# Patient Record
Sex: Male | Born: 1950 | Hispanic: Yes | Marital: Married | Smoking: Never smoker
Health system: Southern US, Community
[De-identification: ages and names within clinical notes are randomized; demographics above are authoritative.]

## PROBLEM LIST (undated history)

## (undated) DIAGNOSIS — E785 Hyperlipidemia, unspecified: Secondary | ICD-10-CM

## (undated) DIAGNOSIS — I82409 Acute embolism and thrombosis of unspecified deep veins of unspecified lower extremity: Secondary | ICD-10-CM

## (undated) DIAGNOSIS — I10 Essential (primary) hypertension: Secondary | ICD-10-CM

## (undated) DIAGNOSIS — M549 Dorsalgia, unspecified: Secondary | ICD-10-CM

## (undated) DIAGNOSIS — C4440 Unspecified malignant neoplasm of skin of scalp and neck: Secondary | ICD-10-CM

## (undated) DIAGNOSIS — M1990 Unspecified osteoarthritis, unspecified site: Secondary | ICD-10-CM

## (undated) HISTORY — DX: Unspecified osteoarthritis, unspecified site: M19.90

## (undated) HISTORY — DX: Acute embolism and thrombosis of unspecified deep veins of unspecified lower extremity: I82.409

## (undated) HISTORY — PX: SPINE SURGERY: SHX786

## (undated) HISTORY — DX: Hyperlipidemia, unspecified: E78.5

## (undated) HISTORY — DX: Unspecified malignant neoplasm of skin of scalp and neck: C44.40

## (undated) HISTORY — DX: Essential (primary) hypertension: I10

## (undated) HISTORY — PX: TRACHEAL SURGERY: SHX1096

## (undated) HISTORY — DX: Dorsalgia, unspecified: M54.9

## (undated) HISTORY — PX: BACK SURGERY: SHX140

---

## 2013-10-11 DIAGNOSIS — M542 Cervicalgia: Secondary | ICD-10-CM | POA: Insufficient documentation

## 2013-10-11 DIAGNOSIS — M545 Low back pain, unspecified: Secondary | ICD-10-CM | POA: Insufficient documentation

## 2013-10-11 DIAGNOSIS — M25559 Pain in unspecified hip: Secondary | ICD-10-CM | POA: Insufficient documentation

## 2014-02-05 DIAGNOSIS — R1013 Epigastric pain: Secondary | ICD-10-CM | POA: Insufficient documentation

## 2014-02-11 DIAGNOSIS — A048 Other specified bacterial intestinal infections: Secondary | ICD-10-CM | POA: Insufficient documentation

## 2015-04-23 DIAGNOSIS — H6123 Impacted cerumen, bilateral: Secondary | ICD-10-CM | POA: Insufficient documentation

## 2015-04-23 DIAGNOSIS — Z23 Encounter for immunization: Secondary | ICD-10-CM | POA: Insufficient documentation

## 2015-04-23 DIAGNOSIS — R350 Frequency of micturition: Secondary | ICD-10-CM | POA: Insufficient documentation

## 2015-04-23 DIAGNOSIS — L989 Disorder of the skin and subcutaneous tissue, unspecified: Secondary | ICD-10-CM | POA: Insufficient documentation

## 2015-05-05 DIAGNOSIS — E782 Mixed hyperlipidemia: Secondary | ICD-10-CM | POA: Insufficient documentation

## 2015-07-17 DIAGNOSIS — Z85828 Personal history of other malignant neoplasm of skin: Secondary | ICD-10-CM | POA: Insufficient documentation

## 2015-11-27 DIAGNOSIS — R1312 Dysphagia, oropharyngeal phase: Secondary | ICD-10-CM | POA: Insufficient documentation

## 2015-11-27 DIAGNOSIS — D7212 Drug rash with eosinophilia and systemic symptoms syndrome: Secondary | ICD-10-CM | POA: Insufficient documentation

## 2015-11-27 DIAGNOSIS — R8281 Pyuria: Secondary | ICD-10-CM | POA: Insufficient documentation

## 2015-11-27 DIAGNOSIS — T50905A Adverse effect of unspecified drugs, medicaments and biological substances, initial encounter: Secondary | ICD-10-CM | POA: Insufficient documentation

## 2015-11-27 DIAGNOSIS — R339 Retention of urine, unspecified: Secondary | ICD-10-CM | POA: Insufficient documentation

## 2015-11-27 DIAGNOSIS — G7281 Critical illness myopathy: Secondary | ICD-10-CM | POA: Insufficient documentation

## 2015-11-27 DIAGNOSIS — D539 Nutritional anemia, unspecified: Secondary | ICD-10-CM | POA: Insufficient documentation

## 2015-11-27 DIAGNOSIS — Z8639 Personal history of other endocrine, nutritional and metabolic disease: Secondary | ICD-10-CM | POA: Insufficient documentation

## 2015-11-27 DIAGNOSIS — G9340 Encephalopathy, unspecified: Secondary | ICD-10-CM | POA: Insufficient documentation

## 2015-11-27 DIAGNOSIS — Z86711 Personal history of pulmonary embolism: Secondary | ICD-10-CM | POA: Insufficient documentation

## 2016-03-01 DIAGNOSIS — M898X9 Other specified disorders of bone, unspecified site: Secondary | ICD-10-CM | POA: Insufficient documentation

## 2016-11-21 DIAGNOSIS — R05 Cough: Secondary | ICD-10-CM | POA: Insufficient documentation

## 2016-11-21 DIAGNOSIS — R053 Chronic cough: Secondary | ICD-10-CM | POA: Insufficient documentation

## 2017-05-09 DIAGNOSIS — K219 Gastro-esophageal reflux disease without esophagitis: Secondary | ICD-10-CM | POA: Insufficient documentation

## 2017-06-26 DIAGNOSIS — Z6826 Body mass index (BMI) 26.0-26.9, adult: Secondary | ICD-10-CM | POA: Insufficient documentation

## 2017-10-05 DIAGNOSIS — F05 Delirium due to known physiological condition: Secondary | ICD-10-CM | POA: Insufficient documentation

## 2017-11-25 DIAGNOSIS — T819XXA Unspecified complication of procedure, initial encounter: Secondary | ICD-10-CM | POA: Insufficient documentation

## 2017-11-25 DIAGNOSIS — M4620 Osteomyelitis of vertebra, site unspecified: Secondary | ICD-10-CM | POA: Insufficient documentation

## 2017-11-25 DIAGNOSIS — T888XXA Other specified complications of surgical and medical care, not elsewhere classified, initial encounter: Secondary | ICD-10-CM | POA: Insufficient documentation

## 2017-11-27 DIAGNOSIS — R768 Other specified abnormal immunological findings in serum: Secondary | ICD-10-CM | POA: Insufficient documentation

## 2018-01-17 DIAGNOSIS — Z981 Arthrodesis status: Secondary | ICD-10-CM | POA: Insufficient documentation

## 2018-02-07 ENCOUNTER — Encounter: Payer: Self-pay | Admitting: Emergency Medicine

## 2018-02-07 ENCOUNTER — Emergency Department
Admission: EM | Admit: 2018-02-07 | Discharge: 2018-02-07 | Disposition: A | Discharge status: Home / Self Care | Payer: Medicare Other | Attending: Emergency Medicine | Admitting: Emergency Medicine

## 2018-02-07 ENCOUNTER — Other Ambulatory Visit: Payer: Self-pay

## 2018-02-07 ENCOUNTER — Emergency Department: Payer: Medicare Other

## 2018-02-07 DIAGNOSIS — R51 Headache: Secondary | ICD-10-CM | POA: Diagnosis not present

## 2018-02-07 DIAGNOSIS — Y999 Unspecified external cause status: Secondary | ICD-10-CM | POA: Insufficient documentation

## 2018-02-07 DIAGNOSIS — Y9289 Other specified places as the place of occurrence of the external cause: Secondary | ICD-10-CM | POA: Insufficient documentation

## 2018-02-07 DIAGNOSIS — M25521 Pain in right elbow: Secondary | ICD-10-CM | POA: Insufficient documentation

## 2018-02-07 DIAGNOSIS — M542 Cervicalgia: Secondary | ICD-10-CM | POA: Insufficient documentation

## 2018-02-07 DIAGNOSIS — M545 Low back pain: Secondary | ICD-10-CM | POA: Diagnosis not present

## 2018-02-07 DIAGNOSIS — M79642 Pain in left hand: Secondary | ICD-10-CM | POA: Diagnosis present

## 2018-02-07 DIAGNOSIS — W1781XA Fall down embankment (hill), initial encounter: Secondary | ICD-10-CM | POA: Insufficient documentation

## 2018-02-07 DIAGNOSIS — W19XXXA Unspecified fall, initial encounter: Secondary | ICD-10-CM

## 2018-02-07 DIAGNOSIS — Y9389 Activity, other specified: Secondary | ICD-10-CM | POA: Insufficient documentation

## 2018-02-07 MED ORDER — TETANUS-DIPHTH-ACELL PERTUSSIS 5-2.5-18.5 LF-MCG/0.5 IM SUSP
0.5000 mL | Freq: Once | INTRAMUSCULAR | Status: DC
  Filled 2018-02-07: qty 0.5

## 2018-02-07 NOTE — ED Triage Notes (Signed)
Pt presents to ED c/o fall. EMS report pt was picked up from metal recycling facility. Pt had been walking around outside and was startled by stray dogs and fell down an 8-58ft embankment and got tangled in a chain link fence with barbed wire. C/o generalized aching. States hx back surgery, is supposed to have another surgery this Friday.

## 2018-02-07 NOTE — ED Notes (Signed)
Abrasions noted to scalp, L hand, and R elbow.

## 2018-02-07 NOTE — ED Provider Notes (Signed)
[facility] Emergency Department Provider Note  ____________________________________________  Time seen: Approximately 4:15 PM  I have reviewed the triage vital signs and the nursing notes.   HISTORY  Chief Complaint Fall    HPI Russell [name] is a 67 y.o. male presents to the emergency department after a fall that occurred today.  Patient reports that he was going to a [place] when he was startled by 3 dogs.  Patient reports that he rolled down an 8 foot embankment and collided with a metal fence that he believes had barbed wire.  Patient reports no new blurry vision, nausea, vomiting and abdominal pain.  He reports most of his pain localized to the head, left hand, right elbow and neck.  He denies weakness, radiculopathy or changes in sensation of the upper extremities.  Patient reports that he is anticipating having a back surgery within the next 2 days.  He reports that his tetanus status is out of date.  He is not currently taking any blood thinners.  He denies being bitten by the dog's.  No alleviating medications were attempted prior to presenting to the emergency department.   History reviewed. No pertinent past medical history.  There are no active problems to display for this patient.   Past Surgical History:  Procedure Laterality Date  . BACK SURGERY      Prior to Admission medications   Not on File    Allergies Patient has no known allergies.  History reviewed. No pertinent family history.  Social History Social History   Tobacco Use  . Smoking status: Never Smoker  . Smokeless tobacco: Never Used  Substance Use Topics  . Alcohol use: No    Frequency: Never  . Drug use: No     Review of Systems  Constitutional: No fever/chills Eyes: No visual changes. No discharge ENT: No upper respiratory complaints. Cardiovascular: no chest pain. Respiratory: no cough. No SOB. Gastrointestinal: No abdominal pain.  No nausea, no vomiting.   No diarrhea.  No constipation. Musculoskeletal: Patient has left hand, right elbow and neck pain. Skin:  patient has abrasions. Neurological: Patient has headache.   ____________________________________________   PHYSICAL EXAM:  VITAL SIGNS: ED Triage Vitals [02/07/18 1451]  Enc Vitals Group     BP 118/81     Pulse Rate 90     Resp 18     Temp 97.7 F (36.5 C)     Temp Source Oral     SpO2 96 %     Weight 180 lb (81.6 kg)     Height 5\' 9"  (1.753 m)     Head Circumference      Peak Flow      Pain Score 5     Pain Loc      Pain Edu?      Excl. in [place]?      Constitutional: Alert and oriented. Well appearing and in no acute distress. Eyes: Conjunctivae are normal. PERRL. EOMI. Head: Atraumatic. ENT:      Ears: TMs are pearly bilaterally.      Nose: No congestion/rhinnorhea.      Mouth/Throat: Mucous membranes are moist.  Neck: No stridor.  No cervical spine tenderness to palpation. Cardiovascular: Normal rate, regular rhythm. Normal S1 and S2.  Good peripheral circulation. Respiratory: Normal respiratory effort without tachypnea or retractions. Lungs CTAB. Good air entry to the bases with no decreased or absent breath sounds. Gastrointestinal: Bowel sounds 4 quadrants. Soft and nontender to palpation. No guarding or rigidity.  No palpable masses. No distention. No CVA tenderness. Musculoskeletal: Full range of motion to all extremities. No gross deformities appreciated. Neurologic:  Normal speech and language. No gross focal neurologic deficits are appreciated.  Skin:  Skin is warm, dry and intact. No rash noted. Psychiatric: Mood and affect are normal. Speech and behavior are normal. Patient exhibits appropriate insight and judgement.   ____________________________________________   LABS (all labs ordered are listed, but only abnormal results are displayed)  Labs Reviewed - No data to  display ____________________________________________  EKG   ____________________________________________  RADIOLOGY Unk Pinto, personally viewed and evaluated these images (plain radiographs) as part of my medical decision making, as well as reviewing the written report by the radiologist.    Dg Lumbar Spine 2-3 Views  Result Date: 02/07/2018 CLINICAL DATA:  Pain post fall EXAM: LUMBAR SPINE - 2-3 VIEW COMPARISON:  None FINDINGS: Five non-rib-bearing lumbar vertebra. Hypoplastic last rib pair. Scattered endplate spur formation lumbar spine. Vertebral body and disc space heights maintained. No acute fracture, subluxation or bone destruction. RIGHT SI joint preserved, LEFT SI joint less well defined question due to slight rotation. Scattered pelvic phleboliths. IVC filter noted. 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 No definite acute bony abnormalities. Electronically Signed   By: Lavonia Dana M.D.   On: 02/07/2018 16:53   Dg Elbow Complete Right  Result Date: 02/07/2018 CLINICAL DATA:  Pain post fall EXAM: RIGHT ELBOW - COMPLETE 3+ VIEW COMPARISON:  None FINDINGS: Osseous demineralization. Joint spaces preserved. No acute fracture, dislocation or bone destruction. No joint effusion. IMPRESSION: No acute osseous abnormalities. Electronically Signed   By: Lavonia Dana M.D.   On: 02/07/2018 16:54   Ct Head Wo Contrast  Result Date: 02/07/2018 CLINICAL DATA:  Status post fall. EMS report pt was picked up from metal recycling facility. Pt had been walking around outside and was startled by stray dogs and fell down an 8-21ft embankment and got tangled in a chain link fence. EXAM: CT HEAD WITHOUT CONTRAST CT CERVICAL SPINE WITHOUT CONTRAST TECHNIQUE: Multidetector CT imaging of the head and cervical spine was performed following the standard protocol without intravenous contrast. Multiplanar CT image reconstructions of the cervical spine  were also generated. COMPARISON:  None. FINDINGS: CT HEAD FINDINGS Brain: No evidence of acute infarction, hemorrhage, hydrocephalus, extra-axial collection or mass lesion/mass effect. Vascular: No hyperdense vessel or unexpected calcification. Skull: No osseous abnormality. Sinuses/Orbits: Visualized paranasal sinuses are clear. Visualized mastoid sinuses are clear. Visualized orbits demonstrate no focal abnormality. Other: None CT CERVICAL SPINE FINDINGS Alignment: Normal. Skull base and vertebrae: No acute fracture. No primary bone lesion or focal pathologic process. Soft tissues and spinal canal: No prevertebral fluid or swelling. No visible canal hematoma. Disc levels: Moderate right facet arthropathy at C3-4. Moderate left facet arthropathy at C4-5. Degenerative disc disease with disc height loss at C5-6 with a broad-based disc osteophyte complex and bilateral uncovertebral degenerative changes. Bilateral foraminal narrowing at C5-6. Partially visualize is cervicothoracic posterior spinal fusion hardware from C6 extending into the thoracic spine. Upper chest: Lung apices are clear. Other: No fluid collection or hematoma. IMPRESSION: 1. No acute intracranial pathology. 2.  No acute osseous injury of the cervical spine. Electronically Signed   By: Kathreen Devoid   On: 02/07/2018 16:37   Ct Cervical Spine Wo Contrast  Result Date: 02/07/2018 CLINICAL DATA:  Status post fall. EMS report pt was picked up from metal recycling facility. Pt had been walking around  outside and was startled by stray dogs and fell down an 8-54ft embankment and got tangled in a chain link fence. EXAM: CT HEAD WITHOUT CONTRAST CT CERVICAL SPINE WITHOUT CONTRAST TECHNIQUE: Multidetector CT imaging of the head and cervical spine was performed following the standard protocol without intravenous contrast. Multiplanar CT image reconstructions of the cervical spine were also generated. COMPARISON:  None. FINDINGS: CT HEAD FINDINGS Brain: No  evidence of acute infarction, hemorrhage, hydrocephalus, extra-axial collection or mass lesion/mass effect. Vascular: No hyperdense vessel or unexpected calcification. Skull: No osseous abnormality. Sinuses/Orbits: Visualized paranasal sinuses are clear. Visualized mastoid sinuses are clear. Visualized orbits demonstrate no focal abnormality. Other: None CT CERVICAL SPINE FINDINGS Alignment: Normal. Skull base and vertebrae: No acute fracture. No primary bone lesion or focal pathologic process. Soft tissues and spinal canal: No prevertebral fluid or swelling. No visible canal hematoma. Disc levels: Moderate right facet arthropathy at C3-4. Moderate left facet arthropathy at C4-5. Degenerative disc disease with disc height loss at C5-6 with a broad-based disc osteophyte complex and bilateral uncovertebral degenerative changes. Bilateral foraminal narrowing at C5-6. Partially visualize is cervicothoracic posterior spinal fusion hardware from C6 extending into the thoracic spine. Upper chest: Lung apices are clear. Other: No fluid collection or hematoma. IMPRESSION: 1. No acute intracranial pathology. 2.  No acute osseous injury of the cervical spine. Electronically Signed   By: Kathreen Devoid   On: 02/07/2018 16:37   Dg Hand Complete Left  Result Date: 02/07/2018 CLINICAL DATA:  Left hand pain after falling. EXAM: LEFT HAND - COMPLETE 3+ VIEW COMPARISON:  None. FINDINGS: No fracture or dislocation. There is moderate osteoarthrosis of the fifth distal and proximal interphalangeal joints. Mild diffuse osteopenia. IMPRESSION: No acute fracture or dislocation of the left hand. Interphalangeal osteoarthrosis of the fifth digit. Electronically Signed   By: Ulyses Jarred M.D.   On: 02/07/2018 16:52    ____________________________________________    PROCEDURES  Procedure(s) performed:    Procedures    Medications  Tdap (BOOSTRIX) injection 0.5 mL (0.5 mLs Intramuscular Refused 02/07/18 1708)      ____________________________________________   INITIAL IMPRESSION / ASSESSMENT AND PLAN / ED COURSE  Pertinent labs & imaging results that were available during my care of the patient were reviewed by me and considered in my medical decision making (see chart for details).  Review of the [place] CSRS was performed in accordance of the [place] prior to dispensing any controlled drugs.     Assessment and plan Fall Patient presents to the emergency department after a fall that occurred earlier today.  Differential diagnosis included subdural hematoma, C-spine fracture, abrasion and contusion.  X-ray examination revealed no acute fractures or bony abnormalities.  Basic wound care was provided in the emergency department.  Patient reports that he has pain medicine at home and declined additional prescriptions for pain medicine.  Patient was advised to follow-up with primary care as needed.  All patient questions were answered.   ____________________________________________  FINAL CLINICAL IMPRESSION(S) / ED DIAGNOSES  Final diagnoses:  Fall, initial encounter      NEW MEDICATIONS STARTED DURING THIS VISIT:  ED Discharge Orders    None          This chart was dictated using voice recognition software/Dragon. Despite best efforts to proofread, errors can occur which can change the meaning. Any change was purely unintentional.    Lannie Fields, PA-C 02/07/18 1742    Carrie Mew, MD 02/09/18 [phone]

## 2018-02-07 NOTE — ED Notes (Signed)
Pt and family state they would prefer to defer the tetanus vaccine until they can follow up with pt's PCP because they were told by surgeon's office not to have any vaccinations done close to pt's surgery appt this friday. EDP [place] notified, okay to hold tetanus shot for now.

## 2018-02-10 DIAGNOSIS — I959 Hypotension, unspecified: Secondary | ICD-10-CM | POA: Insufficient documentation

## 2018-02-10 DIAGNOSIS — D62 Acute posthemorrhagic anemia: Secondary | ICD-10-CM | POA: Insufficient documentation

## 2018-07-13 ENCOUNTER — Ambulatory Visit (INDEPENDENT_AMBULATORY_CARE_PROVIDER_SITE_OTHER): Payer: Medicare Other | Admitting: Urology

## 2018-07-13 ENCOUNTER — Encounter: Payer: Self-pay | Admitting: Urology

## 2018-07-13 VITALS — BP 112/75 | HR 96 | Ht 69.0 in | Wt 192.5 lb

## 2018-07-13 DIAGNOSIS — N529 Male erectile dysfunction, unspecified: Secondary | ICD-10-CM | POA: Diagnosis not present

## 2018-07-13 MED ORDER — TADALAFIL 5 MG PO TABS: 5.0000 mg | ORAL_TABLET | Freq: Every day | ORAL | 11 refills | Status: DC | PRN

## 2018-07-13 NOTE — Progress Notes (Signed)
   07/13/2018 11:12 AM   Russell [name] 10/05/1951 [id number]  Referring provider: Norm Parcel, MD [address] [place], [place] [address]  CC: Erectile dyfunction/phimosis  HPI: I the pleasure of seeing [name] in urology clinic today for consultation from Dr. Truman [name] regarding phimosis.  Today's visit was conducted via a Optometrist.  Briefly, he is an uncircumcised healthy 67 year old male whose complaint is pooling of semen in the foreskin after ejaculation.  It seems though that his primary complaint is losing his erection prematurely, or if he changes position.  He denies any significant urinary symptoms.  Denies gross hematuria or flank pain.  Severity is mild, duration is greater than 1 year, there are no aggravating or alleviating factors.  Denies history of urinary tract infection.    PMH: Past Medical History:  Diagnosis Date  . Arthritis   . Hyperlipidemia   . Hypertension     Surgical History: Past Surgical History:  Procedure Laterality Date  . BACK SURGERY    . SPINE SURGERY    . TRACHEAL SURGERY     Allergies: No Known Allergies  Family History: Family History  Problem Relation Age of Onset  . Prostate cancer Neg Hx   . Bladder Cancer Neg Hx   . Kidney cancer Neg Hx     Social History:  reports that he has never smoked. He has never used smokeless tobacco. He reports that he does not drink alcohol or use drugs.  ROS: Please see flowsheet from today's date for complete review of systems.  Physical Exam: BP 112/75 (BP Location: Left Arm, Patient Position: Sitting, Cuff Size: Normal)   Pulse 96   Ht 5\' 9"  (1.753 m)   Wt 192 lb 8 oz (87.3 kg)   BMI 28.43 kg/m    Constitutional:  Alert and oriented, No acute distress. Cardiovascular: No clubbing, cyanosis, or edema. Respiratory: Normal respiratory effort, no increased work of breathing. GI: Abdomen is soft, nontender, nondistended, no abdominal masses GU: No CVA  tenderness, uncircumcised phallus without lesions, foreskin easily retractable, glans pink and healthy with patent meatus Lymph: No cervical or inguinal lymphadenopathy. Skin: No rashes, bruises or suspicious lesions. Neurologic: Grossly intact, no focal deficits, moving all 4 extremities. Psychiatric: Normal mood and affect.  Laboratory Data: None to review   Pertinent Imaging: None to review.  Assessment & Plan:   In summary, [name] is a Spanish-speaking uncircumcised 66 year old male here for discussion of erectile dysfunction and possible phimosis.  His foreskin is easily retractable, and his primary complaint really is erectile dysfunction.  We discussed at length PDE 5 inhibitors and their role in the management of ED.  I also discussed that the risks and benefits of circumcision, and that this is a permanent procedure.  There is a small risk of bleeding and infection, and the healing time is 4 to 6 weeks.   Trial of Cialis for erectile dysfunction.  If he continues to have bothersome pooling of ejaculate in the foreskin we are happy to perform circumcision in the future.  He will call if he would like to revisit this, or schedule circumcision.   Russell [name], [facility] [address], [place], [place] [address] [phone]

## 2018-07-19 ENCOUNTER — Ambulatory Visit: Payer: Self-pay | Admitting: Urology

## 2018-08-03 DIAGNOSIS — J3801 Paralysis of vocal cords and larynx, unilateral: Secondary | ICD-10-CM | POA: Insufficient documentation

## 2018-08-24 ENCOUNTER — Ambulatory Visit: Payer: Medicare Other | Admitting: Urology

## 2019-02-02 ENCOUNTER — Other Ambulatory Visit: Payer: Self-pay

## 2019-02-02 ENCOUNTER — Emergency Department
Admission: EM | Admit: 2019-02-02 | Discharge: 2019-02-02 | Discharge status: Against Medical Advice | Payer: Medicare Other

## 2019-02-02 NOTE — ED Triage Notes (Signed)
After triage states he is going to [place], that he went to the wrong door.

## 2019-02-05 ENCOUNTER — Other Ambulatory Visit: Payer: Self-pay

## 2019-02-05 ENCOUNTER — Ambulatory Visit (INDEPENDENT_AMBULATORY_CARE_PROVIDER_SITE_OTHER): Payer: Medicare Other | Admitting: Urology

## 2019-02-05 DIAGNOSIS — R05 Cough: Secondary | ICD-10-CM

## 2019-02-05 NOTE — Progress Notes (Signed)
02/05/2019 10:57 AM   Russell [name] 09/27/1951 [id number]  Referring provider: No referring provider defined for this encounter.  No chief complaint on file.   HPI: Russell [name] is a 68 y.o. male of mixed race with erectile dysfunction and phimosis, who reports today complaining of dark urine and difficulty urinating with interpreter, Leola Brazil.    Patient has been previously seen at this clinic on 07/13/2018 by Dr. Diamantina Providence.  On that visit, his complaint was originally given of pooling of semen in the foreskin after ejaculation but on discussion appeared to be loss of his erection prematurely or on changing position.    Patient was recently seen by PCP for fever, cough and SOB.  He is referred onto the screening tent located at the ED.    PMH: Past Medical History:  Diagnosis Date   Arthritis    Hyperlipidemia    Hypertension     Surgical History: Past Surgical History:  Procedure Laterality Date   BACK SURGERY     SPINE SURGERY     TRACHEAL SURGERY      Home Medications:  Allergies as of 02/05/2019   No Known Allergies     Medication List       Accurate as of February 05, 2019 10:57 AM. Always use your most recent med list.        acetaminophen 500 MG tablet Commonly known as:  TYLENOL Take by mouth.   cyclobenzaprine 10 MG tablet Commonly known as:  FLEXERIL Take 10 mg by mouth 3 (three) times daily.   doxycycline 100 MG capsule Commonly known as:  VIBRAMYCIN Take by mouth.   gabapentin 100 MG capsule Commonly known as:  NEURONTIN Take by mouth.   oxyCODONE 5 MG immediate release tablet Commonly known as:  Oxy IR/ROXICODONE Take 5 mg by mouth every 4 (four) hours as needed. for pain   tadalafil 5 MG tablet Commonly known as:  CIALIS Take 1 tablet (5 mg total) by mouth daily as needed for erectile dysfunction.       Allergies: No Known Allergies  Family History: Family History  Problem Relation Age of Onset   Prostate cancer Neg Hx     Bladder Cancer Neg Hx    Kidney cancer Neg Hx     Social History:  reports that he has never smoked. He has never used smokeless tobacco. He reports that he does not drink alcohol or use drugs.  Laboratory Data: No results found for: WBC, HGB, HCT, MCV, PLT  No results found for: CREATININE  No results found for: PSA  No results found for: TESTOSTERONE  No results found for: HGBA1C  Urinalysis No results found for: COLORURINE, APPEARANCEUR, LABSPEC, PHURINE, GLUCOSEU, HGBUR, BILIRUBINUR, KETONESUR, PROTEINUR, UROBILINOGEN, NITRITE, LEUKOCYTESUR  No results found for: LABMICR, [place], RBCUA, LABEPIT, MUCUS, BACTERIA  Pertinent Imaging: No results found for this or any previous visit. No results found for this or any previous visit. No results found for this or any previous visit. No results found for this or any previous visit. No results found for this or any previous visit. No results found for this or any previous visit. No results found for this or any previous visit. No results found for this or any previous visit.  Assessment & Plan:    1. Possible COVID-19 - referred onto screening tent  No follow-ups on file.  Zara Council, PA-C  [facility] [address] [place], [place] [address] [phone]  I, Adele Schilder,  am acting as a Education administrator for [facility], PA-C   I have reviewed the above documentation for accuracy and completeness, and I agree with the above.    Zara Council, PA-C

## 2019-03-10 IMAGING — CR DG ELBOW COMPLETE 3+V*R*
4 series · 4 of 4 positions shown · non-contrast
Comparison: None

CLINICAL DATA: Pain post fall

EXAM:
RIGHT ELBOW - COMPLETE 3+ VIEW

[elbow ap]
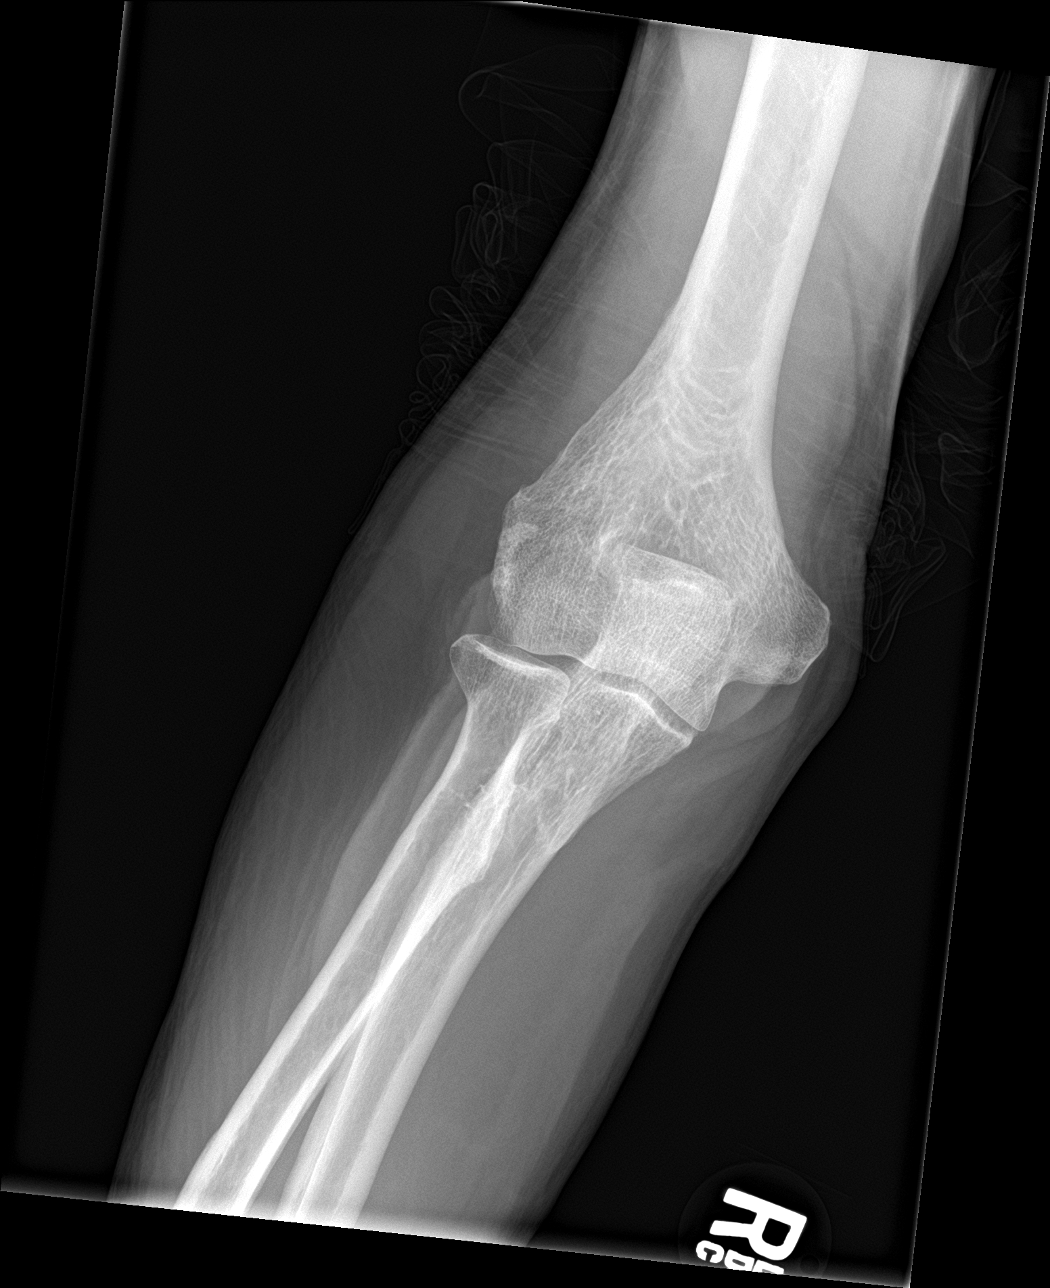

[elbow obl (1 of 2)]
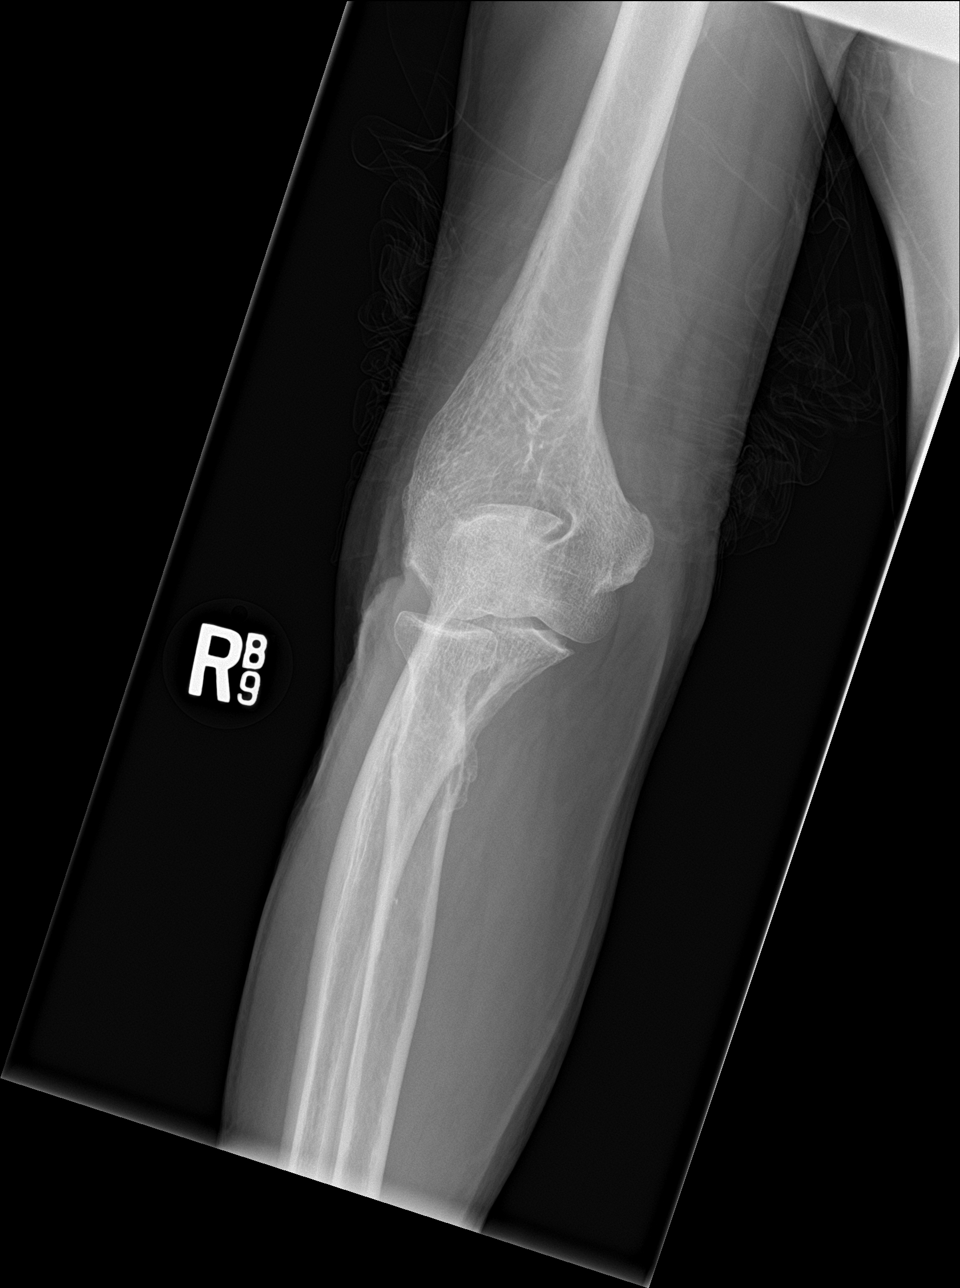

[elbow obl (2 of 2)]
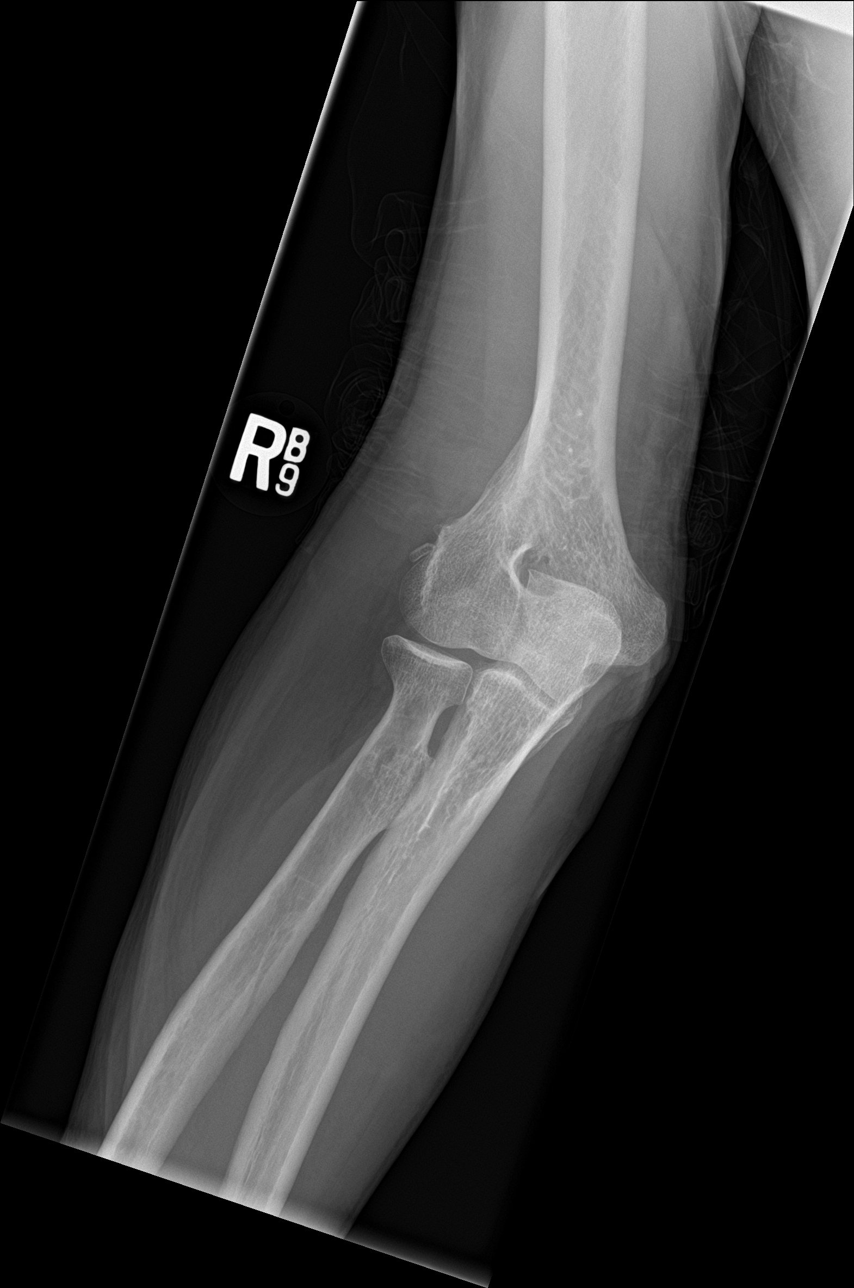

[elbow lat]
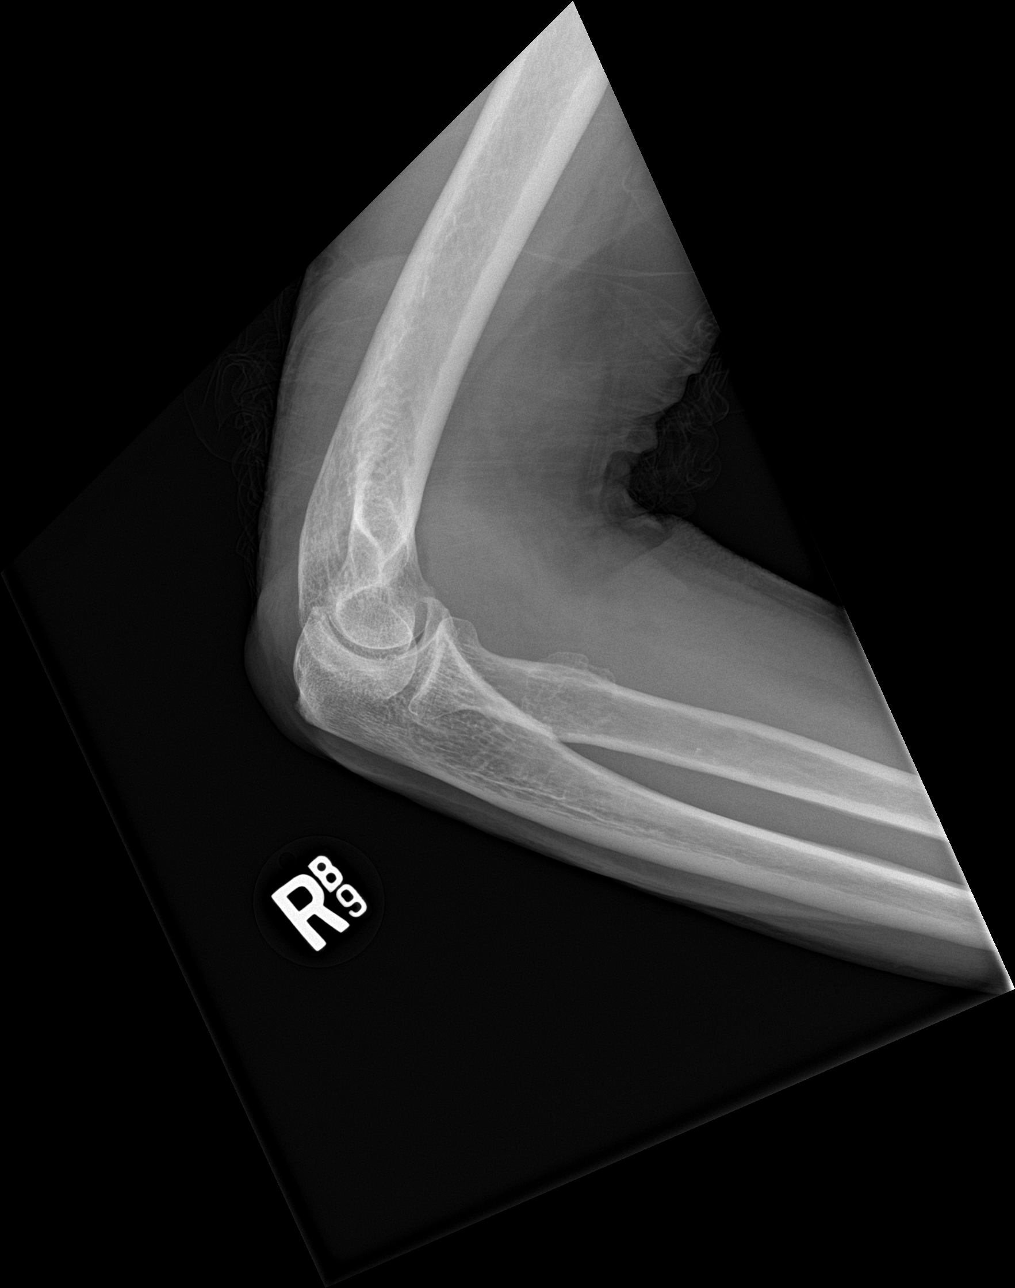

[4 of 4 positions shown; findings below may reference images not displayed]

FINDINGS: Osseous demineralization.

Joint spaces preserved.

No acute fracture, dislocation or bone destruction.

No joint effusion.
IMPRESSION: No acute osseous abnormalities.

## 2019-03-10 IMAGING — CR DG HAND COMPLETE 3+V*L*
3 series · 3 of 3 positions shown · non-contrast
Comparison: None.

CLINICAL DATA: Left hand pain after falling.

EXAM:
LEFT HAND - COMPLETE 3+ VIEW

[hand ap]
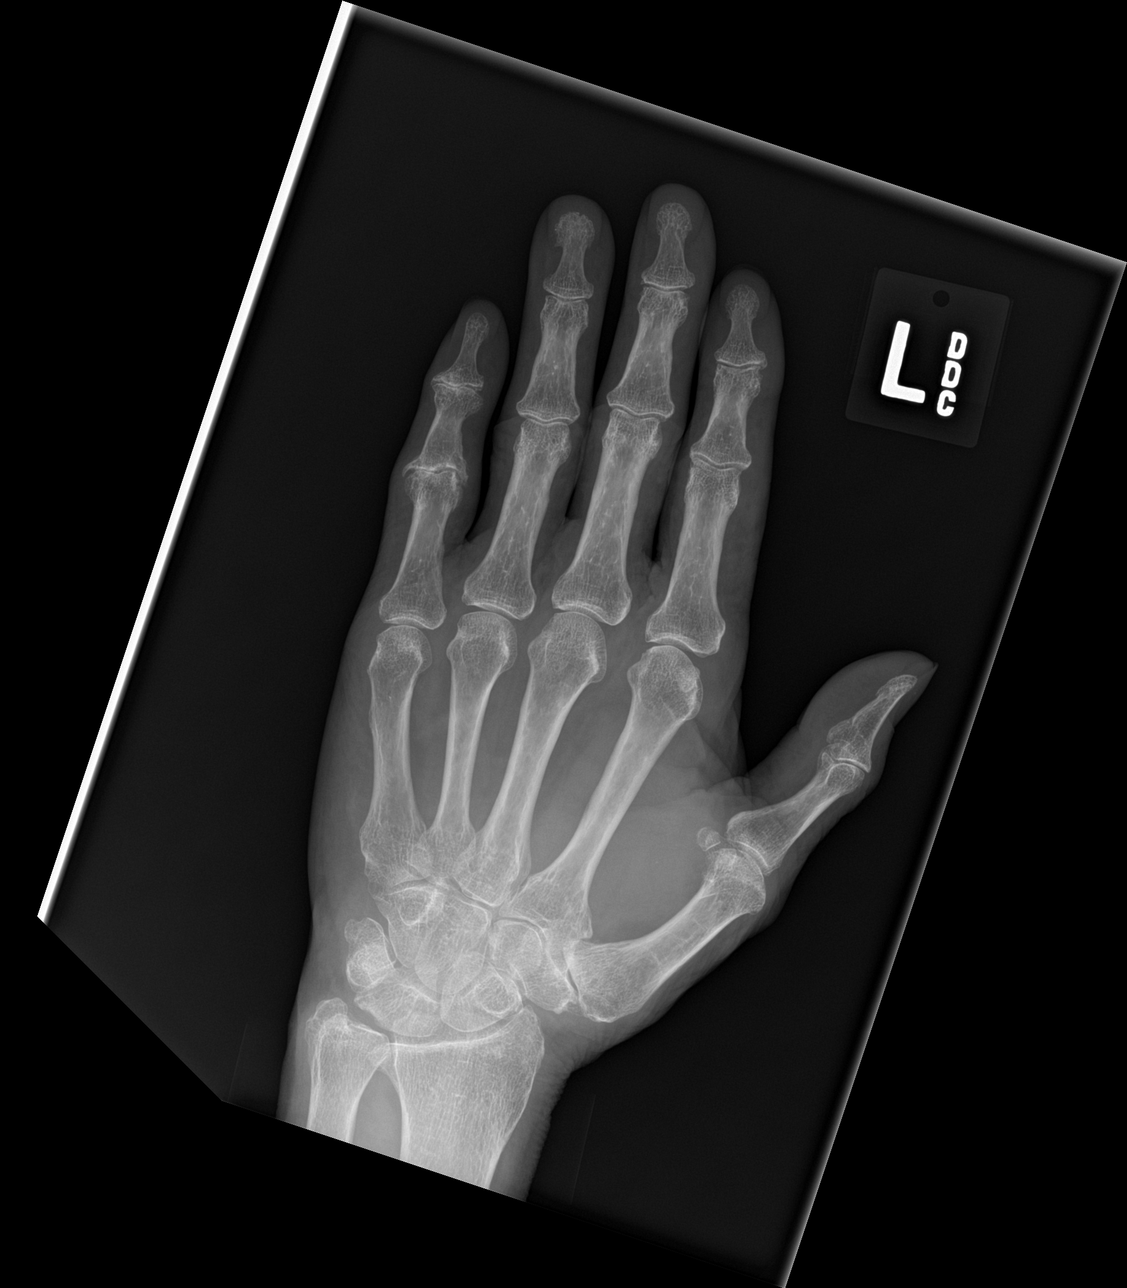

[hand obl]
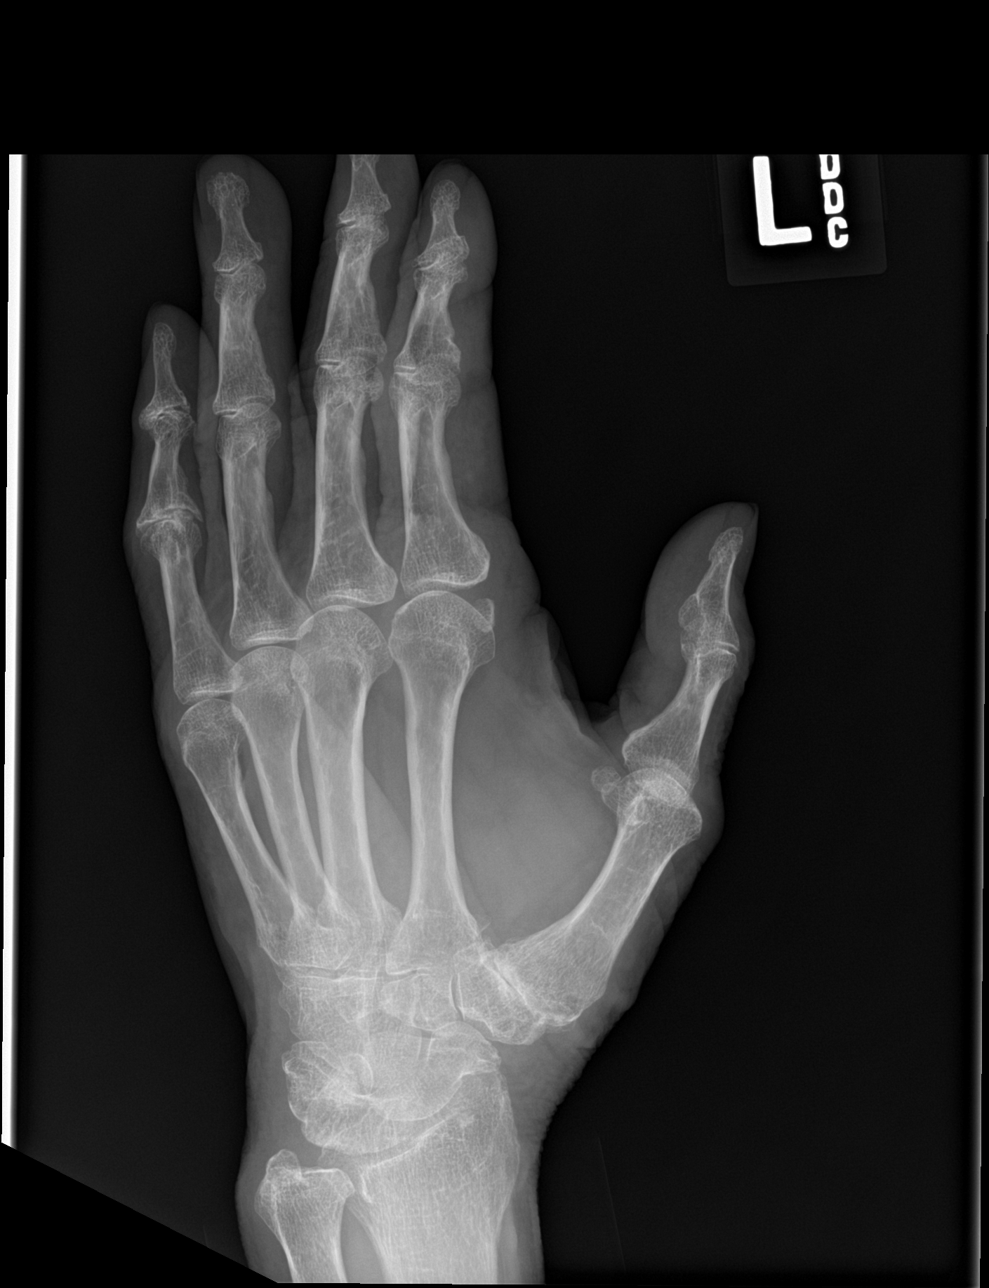

[hand lat]
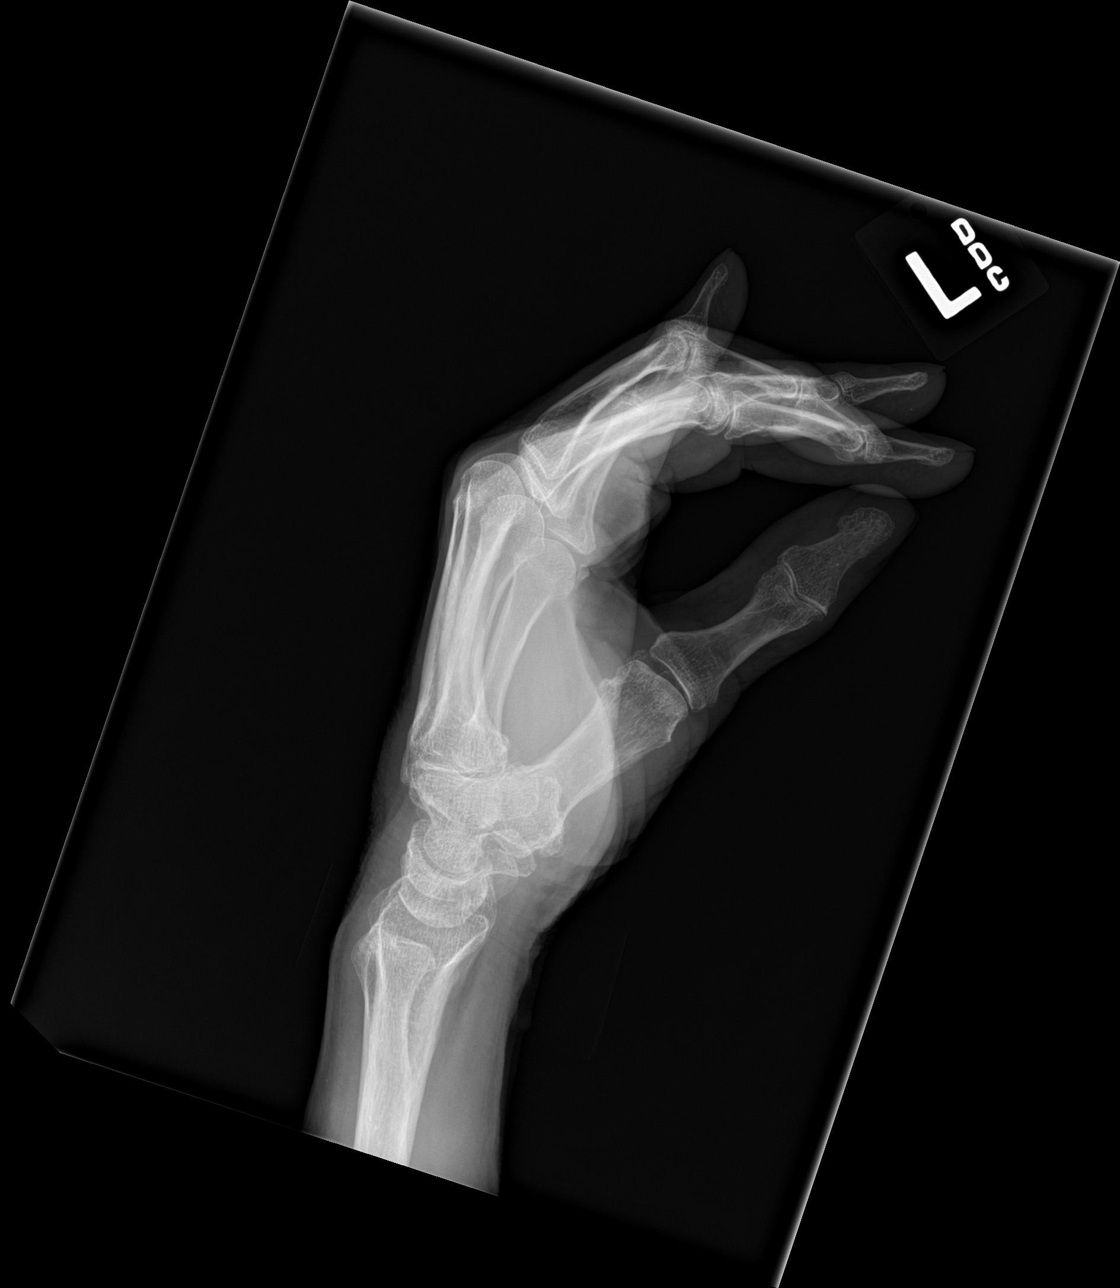

[3 of 3 positions shown; findings below may reference images not displayed]

FINDINGS: No fracture or dislocation. There is moderate osteoarthrosis of the
fifth distal and proximal interphalangeal joints. Mild diffuse
osteopenia.
IMPRESSION: No acute fracture or dislocation of the left hand. Interphalangeal
osteoarthrosis of the fifth digit.

## 2019-07-27 ENCOUNTER — Encounter: Payer: Self-pay | Admitting: Emergency Medicine

## 2019-07-27 ENCOUNTER — Emergency Department
Admission: EM | Admit: 2019-07-27 | Discharge: 2019-07-27 | Disposition: A | Discharge status: Home / Self Care | Payer: Medicare Other | Attending: Emergency Medicine | Admitting: Emergency Medicine

## 2019-07-27 DIAGNOSIS — Y92010 Kitchen of single-family (private) house as the place of occurrence of the external cause: Secondary | ICD-10-CM | POA: Insufficient documentation

## 2019-07-27 DIAGNOSIS — S51852A Open bite of left forearm, initial encounter: Secondary | ICD-10-CM | POA: Diagnosis not present

## 2019-07-27 DIAGNOSIS — Y9389 Activity, other specified: Secondary | ICD-10-CM | POA: Insufficient documentation

## 2019-07-27 DIAGNOSIS — I10 Essential (primary) hypertension: Secondary | ICD-10-CM | POA: Insufficient documentation

## 2019-07-27 DIAGNOSIS — Y999 Unspecified external cause status: Secondary | ICD-10-CM | POA: Insufficient documentation

## 2019-07-27 DIAGNOSIS — W503XXA Accidental bite by another person, initial encounter: Secondary | ICD-10-CM

## 2019-07-27 DIAGNOSIS — S51851A Open bite of right forearm, initial encounter: Secondary | ICD-10-CM | POA: Diagnosis present

## 2019-07-27 MED ORDER — AMOXICILLIN-POT CLAVULANATE 875-125 MG PO TABS: 1.0000 | ORAL_TABLET | Freq: Two times a day (BID) | ORAL | 0 refills | Status: AC

## 2019-07-27 NOTE — Discharge Instructions (Addendum)
Follow-up with your regular doctor or return emergency department if any sign of infection.  Take the Augmentin as prescribed.  Clean the wound daily with soap and water.
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
## 2019-08-27 ENCOUNTER — Telehealth: Payer: Self-pay | Admitting: Urology

## 2019-08-27 NOTE — Telephone Encounter (Signed)
Would you please have Mr. Mooneyhan reschedule his appointment for dark colored urine?

## 2019-08-28 NOTE — Telephone Encounter (Signed)
Appt made and pt notified, Interpreter requested.
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
## 2019-08-30 ENCOUNTER — Ambulatory Visit (INDEPENDENT_AMBULATORY_CARE_PROVIDER_SITE_OTHER): Payer: Medicare Other | Admitting: Urology

## 2019-08-30 ENCOUNTER — Encounter: Payer: Self-pay | Admitting: Urology

## 2019-08-30 ENCOUNTER — Other Ambulatory Visit: Payer: Self-pay

## 2019-08-30 VITALS — BP 116/78 | HR 106 | Ht 71.0 in | Wt 202.0 lb

## 2019-08-30 DIAGNOSIS — R39198 Other difficulties with micturition: Secondary | ICD-10-CM

## 2019-08-30 DIAGNOSIS — R3129 Other microscopic hematuria: Secondary | ICD-10-CM

## 2019-08-30 DIAGNOSIS — M4807 Spinal stenosis, lumbosacral region: Secondary | ICD-10-CM | POA: Insufficient documentation

## 2019-08-30 DIAGNOSIS — M51379 Other intervertebral disc degeneration, lumbosacral region without mention of lumbar back pain or lower extremity pain: Secondary | ICD-10-CM | POA: Insufficient documentation

## 2019-08-30 DIAGNOSIS — M5137 Other intervertebral disc degeneration, lumbosacral region: Secondary | ICD-10-CM | POA: Insufficient documentation

## 2019-08-30 LAB — URINALYSIS, COMPLETE
Bilirubin, UA: NEGATIVE
Glucose, UA: NEGATIVE
Ketones, UA: NEGATIVE
Nitrite, UA: NEGATIVE
Specific Gravity, UA: >1.03 — ABNORMAL HIGH (ref 1.005–1.030)
Urobilinogen, Ur: 0.2 mg/dL (ref 0.2–1.0)
pH, UA: 5.5 (ref 5.0–7.5)

## 2019-08-30 LAB — MICROSCOPIC EXAMINATION: WBC, UA: >30 /hpf — AB (ref 0–5)

## 2019-08-30 LAB — BLADDER SCAN AMB NON-IMAGING

## 2019-08-30 MED ORDER — TADALAFIL 5 MG PO TABS: 5.0000 mg | ORAL_TABLET | Freq: Every day | ORAL | 0 refills | Status: DC | PRN

## 2019-08-30 MED ORDER — TADALAFIL 5 MG PO TABS: 5.0000 mg | ORAL_TABLET | Freq: Every day | ORAL | 11 refills | Status: DC | PRN

## 2019-09-02 LAB — CULTURE, URINE COMPREHENSIVE

## 2019-09-06 ENCOUNTER — Other Ambulatory Visit: Payer: Self-pay | Admitting: Urology

## 2019-09-06 ENCOUNTER — Ambulatory Visit
Admission: RE | Admit: 2019-09-06 | Discharge: 2019-09-06 | Disposition: A | Discharge status: Home / Self Care | Payer: Medicare Other | Source: Ambulatory Visit | Attending: Urology | Admitting: Urology

## 2019-09-06 ENCOUNTER — Other Ambulatory Visit: Payer: Self-pay

## 2019-09-06 DIAGNOSIS — N321 Vesicointestinal fistula: Secondary | ICD-10-CM

## 2019-09-06 DIAGNOSIS — R3129 Other microscopic hematuria: Secondary | ICD-10-CM | POA: Diagnosis present

## 2019-09-06 HISTORY — DX: Vesicointestinal fistula: N32.1

## 2019-09-06 LAB — POCT I-STAT CREATININE: Creatinine, Ser: 1.1 mg/dL (ref 0.61–1.24)

## 2019-09-06 MED ORDER — CIPROFLOXACIN HCL 500 MG PO TABS: 500.0000 mg | ORAL_TABLET | Freq: Two times a day (BID) | ORAL | 0 refills | Status: DC

## 2019-09-06 MED ORDER — METRONIDAZOLE 500 MG PO TABS: 500.0000 mg | ORAL_TABLET | Freq: Three times a day (TID) | ORAL | 0 refills | Status: AC

## 2019-09-07 ENCOUNTER — Encounter: Payer: Self-pay | Admitting: Emergency Medicine

## 2019-09-07 ENCOUNTER — Other Ambulatory Visit: Payer: Self-pay

## 2019-09-07 ENCOUNTER — Emergency Department
Admission: EM | Admit: 2019-09-07 | Discharge: 2019-09-07 | Disposition: A | Discharge status: Home / Self Care | Payer: Medicare Other | Attending: Emergency Medicine | Admitting: Emergency Medicine

## 2019-09-07 DIAGNOSIS — Z86711 Personal history of pulmonary embolism: Secondary | ICD-10-CM | POA: Insufficient documentation

## 2019-09-07 DIAGNOSIS — R21 Rash and other nonspecific skin eruption: Secondary | ICD-10-CM | POA: Diagnosis present

## 2019-09-07 DIAGNOSIS — Z79899 Other long term (current) drug therapy: Secondary | ICD-10-CM | POA: Diagnosis not present

## 2019-09-07 DIAGNOSIS — I10 Essential (primary) hypertension: Secondary | ICD-10-CM | POA: Diagnosis not present

## 2019-09-07 DIAGNOSIS — T7840XA Allergy, unspecified, initial encounter: Secondary | ICD-10-CM | POA: Insufficient documentation

## 2019-09-07 MED ORDER — HYDROXYZINE HCL 10 MG/5ML PO SYRP: 10.0000 mg | ORAL_SOLUTION | Freq: Three times a day (TID) | ORAL | 0 refills | Status: DC | PRN

## 2019-09-07 MED ORDER — PREDNISONE 20 MG PO TABS
60.0000 mg | ORAL_TABLET | Freq: Once | ORAL | Status: AC
  Administered 2019-09-07: 60 mg via ORAL
  Filled 2019-09-07: qty 3

## 2019-09-07 MED ORDER — METHYLPREDNISOLONE 4 MG PO TBPK: ORAL_TABLET | ORAL | 0 refills | Status: DC

## 2019-09-07 NOTE — Discharge Instructions (Addendum)
Follow discharge care instruction take medication as directed. °

## 2019-09-07 NOTE — ED Triage Notes (Signed)
States rash since yesterday after MRI, no resp distress.

## 2019-09-07 NOTE — ED Notes (Signed)
See trige note  Presents with generalized rash  States rash started after having contrast for MRI

## 2019-09-07 NOTE — ED Provider Notes (Signed)
[facility] Emergency Department Provider Note   ____________________________________________   First MD Initiated Contact with Patient 09/07/19 1318     (approximate)  I have reviewed the triage vital signs and the nursing notes.   HISTORY  Chief Complaint Rash    HPI Russell [name] is a 68 y.o. male patient of a rash after having a having a CT of the abdomen with contrast.  Patient stated he has mild itching.  Patient denies dyspnea or any other anaphylactic signs and symptoms.  Patient denies any pain or fever.  No palliative measures for complaint.         Past Medical History:  Diagnosis Date  . Arthritis   . Hyperlipidemia   . Hypertension     Patient Active Problem List   Diagnosis Date Noted  . Lumbosacral stenosis 08/30/2019  . Degeneration of lumbosacral intervertebral disc 08/30/2019  . 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 Hypotension 02/10/2018  . S/P spinal fusion 01/17/2018  . Other specified abnormal immunological findings in serum 11/27/2017  . Postoperative or surgical complication [id number]  . 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[place]) 11/25/2017  . Fluid collection at surgical site 11/25/2017  . Delirium due to another medical condition 10/05/2017  . GERD (gastroesophageal reflux disease) 05/09/2017  . Heterotopic ossification 03/01/2016  . 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 Pyuria 11/27/2015  . Oropharyngeal dysphagia 11/27/2015  . Macrocytic anemia 11/27/2015  . History of pulmonary embolism 11/27/2015  . History of malnutrition 11/27/2015  . Personal history of other malignant neoplasm of skin 07/17/2015  . Hyperlipidemia, mixed 05/05/2015  . Excessive cerumen in both ear canals 04/23/2015  . Skin lesions, generalized 04/23/2015  . Need for vaccination 04/23/2015  . Increased frequency of urination 04/23/2015  . Helicobacter pylori (H. pylori) infection 02/11/2014  . Dyspepsia 02/05/2014  . Neck pain 10/11/2013  . Pain  in joint, pelvic region and thigh 10/11/2013    Past Surgical History:  Procedure Laterality Date  . BACK SURGERY    . SPINE SURGERY    . TRACHEAL SURGERY      Prior to Admission medications   Medication Sig Start Date End Date Taking? Authorizing Provider  atorvastatin (LIPITOR) 20 MG tablet Take 20 mg by mouth daily. 08/20/19   [provider]  ciprofloxacin (CIPRO) 500 MG tablet Take 1 tablet (500 mg total) by mouth every 12 (twelve) hours. 09/06/19   Zara Council A, PA-C  hydrOXYzine (ATARAX) 10 MG/5ML syrup Take 5 mLs (10 mg total) by mouth 3 (three) times daily as needed for itching. 09/07/19   Sable Feil, PA-C  methylPREDNISolone (MEDROL DOSEPAK) 4 MG TBPK tablet Take Tapered dose as directed starting tomorrow morning. 09/07/19   Sable Feil, PA-C  metroNIDAZOLE (FLAGYL) 500 MG tablet Take 1 tablet (500 mg total) by mouth 3 (three) times daily for 14 days. 09/06/19 09/20/19  Zara Council A, PA-C  sulfamethoxazole-trimethoprim (BACTRIM DS) 800-160 MG tablet Take 1 tablet by mouth 2 (two) times daily. for 7 days 08/24/19   [provider]  tadalafil (CIALIS) 5 MG tablet Take 1 tablet (5 mg total) by mouth daily as needed for erectile dysfunction. 08/30/19   Zara Council A, PA-C    Allergies Patient has no known allergies.  Family History  Problem Relation Age of Onset  . Prostate cancer Neg Hx   . Bladder Cancer Neg Hx   . Kidney cancer Neg Hx     Social History Social History   Tobacco Use  .  Smoking status: Never Smoker  . Smokeless tobacco: Never Used  Substance Use Topics  . Alcohol use: No    Frequency: Never  . Drug use: No    Review of Systems  Constitutional: No fever/chills Eyes: No visual changes. ENT: No sore throat. Cardiovascular: Denies chest pain. Respiratory: Denies shortness of breath. Gastrointestinal: No abdominal pain.  No nausea, no vomiting.  No diarrhea.  No constipation. Genitourinary: Negative for  dysuria. Musculoskeletal: Negative for back pain. Skin: Positive for rash. Neurological: Negative for headaches, focal weakness or numbness. Endocrine:  Hyperlipidemia and hypertension   ____________________________________________   PHYSICAL EXAM:  VITAL SIGNS: ED Triage Vitals [09/07/19 1253]  Enc Vitals Group     BP 101/70     Pulse Rate 100     Resp 20     Temp 98 F (36.7 C)     Temp Source Oral     SpO2 96 %     Weight 200 lb (90.7 kg)     Height 5\' 10"  (1.778 m)     Head Circumference      Peak Flow      Pain Score 0     Pain Loc      Pain Edu?      Excl. in [place]?    Constitutional: Alert and oriented. Well appearing and in no acute distress. Cardiovascular: Normal rate, regular rhythm. Grossly normal heart sounds.  Good peripheral circulation. Respiratory: Normal respiratory effort.  No retractions. Lungs CTAB. Gastrointestinal: Soft and nontender. No distention. No abdominal bruits. No CVA tenderness. Genitourinary: Deferred Musculoskeletal: No lower extremity tenderness nor edema.  No joint effusions. Neurologic:  Normal speech and language. No gross focal neurologic deficits are appreciated. No gait instability. Skin:  Skin is warm, dry and intact.  Diffuse erythematous macular lesions.   Psychiatric: Mood and affect are normal. Speech and behavior are normal.  ____________________________________________   LABS (all labs ordered are listed, but only abnormal results are displayed)  Labs Reviewed - No data to display ____________________________________________  EKG   ____________________________________________  RADIOLOGY  ED MD interpretation:    Official radiology report(s): No results found.  ____________________________________________   PROCEDURES  Procedure(s) performed (including Critical Care):  Procedures   ____________________________________________   INITIAL IMPRESSION / ASSESSMENT AND PLAN / ED COURSE  As part of my  medical decision making, I reviewed the following data within the [place] was evaluated in Emergency Department on 09/07/2019 for the symptoms described in the history of present illness. He was evaluated in the context of the global COVID-19 pandemic, which necessitated consideration that the patient might be at risk for infection with the SARS-CoV-2 virus that causes COVID-19. Institutional protocols and algorithms that pertain to the evaluation of patients at risk for COVID-19 are in a state of rapid change based on information released by regulatory bodies including the CDC and federal and state organizations. These policies and algorithms were followed during the patient's care in the ED.    Patient present with diffuse rash status post CT with contrast yesterday.  Patient stated mild itching.  Patient denies dyspnea or any other anaphylactic signs or symptoms.  Patient given discharge care instructions.  Patient given prednisone prior to departure.  Patient given a prescription of Medrol Dosepak and low-dose Atarax.  Patient advised to contact PCP in 2 days and discuss complaint.   ____________________________________________   FINAL CLINICAL IMPRESSION(S) / ED DIAGNOSES  Final  diagnoses:  Allergic reaction, initial encounter     ED Discharge Orders         Ordered    methylPREDNISolone (MEDROL DOSEPAK) 4 MG TBPK tablet     09/07/19 1327    hydrOXYzine (ATARAX) 10 MG/5ML syrup  3 times daily PRN     09/07/19 1327           Note:  This document was prepared using Dragon voice recognition software and may include unintentional dictation errors.    Sable Feil, PA-C 09/07/19 1331    Nena Polio, MD 09/07/19 [phone]

## 2019-09-09 ENCOUNTER — Telehealth: Payer: Self-pay | Admitting: Urology

## 2019-09-09 NOTE — Telephone Encounter (Signed)
Would you call Russell [name] and see if he got my message from Friday?  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  He was also seen in the ED for a rash on the 17th.  It may have been from the contrast he received for the CT scan, but if he started those antibiotics it may be from one of those.

## 2019-09-09 NOTE — Progress Notes (Signed)
Spanish interpreter called and left message for patient to call office to discuss results.

## 2019-09-09 NOTE — Telephone Encounter (Signed)
Spanish interpreter called and left message for patient to call office to discuss results.

## 2019-09-10 ENCOUNTER — Ambulatory Visit: Payer: Medicare Other

## 2019-09-11 DIAGNOSIS — M47812 Spondylosis without myelopathy or radiculopathy, cervical region: Secondary | ICD-10-CM | POA: Insufficient documentation

## 2019-09-11 DIAGNOSIS — S63519A Sprain of carpal joint of unspecified wrist, initial encounter: Secondary | ICD-10-CM | POA: Insufficient documentation

## 2019-09-11 DIAGNOSIS — M5030 Other cervical disc degeneration, unspecified cervical region: Secondary | ICD-10-CM | POA: Insufficient documentation

## 2019-09-13 ENCOUNTER — Other Ambulatory Visit: Payer: Self-pay

## 2019-09-13 ENCOUNTER — Ambulatory Visit (INDEPENDENT_AMBULATORY_CARE_PROVIDER_SITE_OTHER): Payer: Medicare Other | Admitting: Urology

## 2019-09-13 ENCOUNTER — Encounter: Payer: Self-pay | Admitting: Urology

## 2019-09-13 VITALS — BP 112/78 | HR 110 | Ht 71.0 in | Wt 202.0 lb

## 2019-09-13 DIAGNOSIS — N321 Vesicointestinal fistula: Secondary | ICD-10-CM

## 2019-09-13 DIAGNOSIS — R3129 Other microscopic hematuria: Secondary | ICD-10-CM | POA: Diagnosis not present

## 2019-09-13 LAB — URINALYSIS, COMPLETE
Bilirubin, UA: NEGATIVE
Glucose, UA: NEGATIVE
Nitrite, UA: NEGATIVE
Specific Gravity, UA: >1.03 — ABNORMAL HIGH (ref 1.005–1.030)
Urobilinogen, Ur: 0.2 mg/dL (ref 0.2–1.0)
pH, UA: 5 (ref 5.0–7.5)

## 2019-09-13 LAB — MICROSCOPIC EXAMINATION: WBC, UA: >30 /hpf — AB (ref 0–5)

## 2019-09-13 MED ORDER — LIDOCAINE HCL URETHRAL/MUCOSAL 2 % EX GEL
1.0000 "application " | Freq: Once | CUTANEOUS | Status: AC
  Administered 2019-09-13: 1 via URETHRAL

## 2019-09-13 NOTE — Progress Notes (Signed)
   09/13/19  CC:  Chief Complaint  Patient presents with  . Cysto    HPI: 68 y.o. male who saw Zara Council 08/30/2019 for lower urinary tract symptoms and passing debris in urine.  Urinalysis showed pyuria however urine culture grew mixed flora.  CTU performed 09/06/2019 suspicious for colovesical fistula with air noted in bladder along with bladder wall thickening and inflammatory changes.  Denies previous history of diverticulitis  Blood pressure 112/78, pulse (!) 110, height 5\' 11"  (1.803 m), weight 202 lb (91.6 kg). NED. A&Ox3.   No respiratory distress    Cystoscopy Procedure Note  Patient identification was confirmed, informed consent was obtained, and patient was prepped using Betadine solution.  Lidocaine jelly was administered per urethral meatus.     Pre-Procedure: - Inspection reveals a normal caliber urethral meatus.  Procedure: The flexible cystoscope was introduced without difficulty - No urethral strictures/lesions are present. - Moderate lateral lobe enlargement prostate  - Mild elevation bladder neck - Bilateral ureteral orifices identified - Bladder mucosa  reveals erythema, bullous edema posterior wall.  Particulate material in bladder - No bladder stones - No trabeculation  Retroflexion shows no intravesical median lobe   Post-Procedure: - Patient tolerated the procedure well  Assessment/ Plan: CT and cystoscopy suspicious for colovesical fistula.  Findings were explained to patient via a [place] interpreter.  Zara Council placed a general surgery referral earlier this month which is scheduled with Dr. Hampton Abbot on 10/30.   Abbie Sons, MD

## 2019-09-20 ENCOUNTER — Ambulatory Visit: Payer: Medicare Other | Admitting: Surgery

## 2019-09-24 ENCOUNTER — Encounter: Payer: Self-pay | Admitting: Surgery

## 2019-09-24 ENCOUNTER — Ambulatory Visit (INDEPENDENT_AMBULATORY_CARE_PROVIDER_SITE_OTHER): Payer: Medicare Other | Admitting: Surgery

## 2019-09-24 ENCOUNTER — Other Ambulatory Visit: Payer: Self-pay

## 2019-09-24 DIAGNOSIS — N321 Vesicointestinal fistula: Secondary | ICD-10-CM | POA: Diagnosis not present

## 2019-09-24 NOTE — Patient Instructions (Addendum)
Referral sent to [facility] gastroenterologist. Someone from their office will call to schedule an appointment within 5-7 days.   You will need to have the Colonoscopy done prior  to seeing Dr.Piscoya in December.   Please see follow up appointment with Dr.Piscoya listed below.

## 2019-09-24 NOTE — Progress Notes (Signed)
09/24/2019  Reason for Visit:  Colovesical fistula  Referring Provider:  Zara Council, PA-C  History of Present Illness: Russell [name] is a 68 y.o. 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  Patient reports that he has noted debris and feces in his urine for approximately 2 months.  He saw Ms. McGowan on 10/9 and a CT urogram was ordered to evaluate.  She also had a urinalysis on 10/9 which was positive for leukocytes, moderate for bacteria and more than 30 white blood cells.  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  In the bladder, there was some wall thickening and inflammation along the left bladder dome with also nondependent gas in the bladder.  I have independently viewed the images and agree with the findings.  He had a cystoscopy done on 10/23 with Dr. Bernardo Heater which showed erythema in the bladder with bullous edema in the posterior wall in particulate material in the bladder.   Patient reports that he has had some low-grade pain or discomfort in the pelvis for a long time is unable to tell me for how long exactly.  He reports that he had a colonoscopy maybe 20 to 30 years ago which was normal.  Has not had a colonoscopy recently.  His PCP had talked to him about doing a stool study but he never got this done.  He was given antibiotics for his diverticulitis and completed them last week.  Denies any diarrhea, constipation, or blood in the stool.   Past Medical History: Past Medical History:  Diagnosis Date  . Arthritis   . Hyperlipidemia   . Hypertension      Past Surgical History: Past Surgical History:  Procedure Laterality Date  . BACK SURGERY    . SPINE SURGERY    . TRACHEAL SURGERY      Home Medications: Prior to Admission medications   Medication Sig Start Date End Date Taking?  Authorizing Provider  atorvastatin (LIPITOR) 20 MG tablet Take 20 mg by mouth daily. 08/20/19  Yes [provider]  hydrOXYzine (ATARAX) 10 MG/5ML syrup Take 5 mLs (10 mg total) by mouth 3 (three) times daily as needed for itching. 09/07/19  Yes Sable Feil, PA-C  tadalafil (CIALIS) 5 MG tablet Take 1 tablet (5 mg total) by mouth daily as needed for erectile dysfunction. 08/30/19  Yes McGowan, Larene [place] A, PA-C    Allergies: No Known Allergies  Social History:  reports that he has never smoked. He has never used smokeless tobacco. He reports that he does not drink alcohol or use drugs.   Family History: Family History  Problem Relation Age of Onset  . Prostate cancer Neg Hx   . Bladder Cancer Neg Hx   . Kidney cancer Neg Hx     Review of Systems: Review of Systems  Constitutional: Negative for chills and fever.  HENT: Negative for hearing loss.   Eyes: Negative for blurred vision.  Respiratory: Negative for shortness of breath.   Cardiovascular: Negative for chest pain.  Gastrointestinal: Positive for abdominal pain (low pelvic discomfort). Negative for blood in stool, constipation, diarrhea, heartburn, nausea and vomiting.  Genitourinary: Negative for dysuria.  Musculoskeletal: Negative for myalgias.  Skin: Positive for rash (skin rash after CT scan).  Neurological: Negative for dizziness.  Psychiatric/Behavioral: Negative for depression.    Physical Exam There were no vitals taken for this  visit. CONSTITUTIONAL: No acute distress HEENT:  Normocephalic, atraumatic, extraocular motion intact. NECK: Trachea is midline, and there is no jugular venous distension.  RESPIRATORY:  Lungs are clear, and breath sounds are equal bilaterally. Normal respiratory effort without pathologic use of accessory muscles. CARDIOVASCULAR: Heart is regular without murmurs, gallops, or rubs. GI: The abdomen is soft, non-distended, with some mild discomfort in the pelvic area but no  peritonitis or no significant pain.  He described is as pressure that made him want to urinate.  There were no palpable masses. There was no hepatosplenomegaly. MUSCULOSKELETAL:  Normal muscle strength and tone in all four extremities.  No peripheral edema or cyanosis. SKIN: Skin turgor is normal. There are no pathologic skin lesions.  NEUROLOGIC:  Motor and sensation is grossly normal.  Cranial nerves are grossly intact. PSYCH:  Alert and oriented to person, place and time. Affect is normal.  Laboratory Analysis: U/A 10/23: 2+ leukocytes, negative nitrite, many bacteria, >30 WBC.  Imaging: CT abdomen/pelvis 10/16: IMPRESSION: Extensive sigmoid diverticulosis with suspected mild acute sigmoid diverticulitis. No drainable fluid collection/abscess. No free air.  Wall thickening/intramural inflammation along the left bladder dome, likely reflecting fistulous communication between the sigmoid colon and bladder. Associated nondependent gas in the bladder.   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  Discussed with the patient the findings of colovesical fistula based on the CT scan he had on 10/16 and the cystoscopy done with Dr. Bernardo Heater.  Discussed with him that first before planning any surgery, I would like for him to get a colonoscopy to further evaluate his colon, particularly sigmoid, but also to make sure there are no other polyps or masses.  His last colonoscopy was at least 20 years ago from his recollection. On CT scan, there is no abscess and no perforation, so there is no emergency to repair his colovesical fistula.  Based on the colonoscopy, we can discuss surgical plans for sigmoidectomy and primary repair of the bladder where the fistula tract is located.  Given that his CT scan was showing mild diverticulitis, will place referral for colonoscopy for mid December, so it would be 8 weeks after the CT scan was done, so there is less risk for perforation.     He will follow up with [place] after his colonoscopy so we can discuss the results and surgical planning.  Face-to-face time spent with the patient and care providers was 60 minutes, with more than 50% of the time spent counseling, educating, and coordinating care of the patient.     Melvyn Neth, [facility]

## 2019-10-14 ENCOUNTER — Ambulatory Visit (INDEPENDENT_AMBULATORY_CARE_PROVIDER_SITE_OTHER): Payer: Medicare Other | Admitting: Gastroenterology

## 2019-10-14 ENCOUNTER — Other Ambulatory Visit: Payer: Self-pay

## 2019-10-14 ENCOUNTER — Encounter: Payer: Self-pay | Admitting: Gastroenterology

## 2019-10-14 VITALS — BP 119/76 | HR 96 | Temp 98.2°F | Ht 71.0 in | Wt 210.0 lb

## 2019-10-14 DIAGNOSIS — N321 Vesicointestinal fistula: Secondary | ICD-10-CM | POA: Diagnosis not present

## 2019-10-14 DIAGNOSIS — R933 Abnormal findings on diagnostic imaging of other parts of digestive tract: Secondary | ICD-10-CM

## 2019-10-14 NOTE — Progress Notes (Signed)
[name] MD, MRCP(U.K) [address]  [place]  [place], [place] [address]  Main: [phone]  Fax: [phone]   Gastroenterology Consultation  Referring Provider:     Olean Ree, MD Primary Care Physician:  System, Pcp Not In Primary Gastroenterologist:  Dr. [name]  Reason for Consultation: Evaluation of colovesical fistula        HPI:   Russell [name] is a 68 y.o. y/o male referred for consultation & management  by Dr. Hampton Abbot.  He was seen by Dr. Hampton Abbot on 09/24/2019 for possible colovesical fistula.  Noted some debris in feces and urine for approximately 2 months.  Subsequently a CT urogram performed showed the findings below.  Has undergone cystoscopy.  Last colonoscopy was many years back. CT hematuria work-up scan on 09/06/2019 showed extensive sigmoid diverticulosis with suspected mild acute sigmoid diverticulitis.  No drainable fluid collection or abscess no free air.  Wall thickening with intramural inflammation along the left bladder dome likely reflecting fistulous communication between the sigmoid colon and bladder.  The entire visit was with a [place] interpreter.  The patient states that for the few months she has noticed feces and gas in his urine.  Denies any abdominal pain.  Denies any change in his bowel movements.  Denies any shape change of his stools.  Last colonoscopy was many years back.  Denies any rectal bleeding.  Presently has no abdominal pain.  He does have urgency and increased frequency of urination.  No other GI symptoms.  Past Medical History:  Diagnosis Date  . Arthritis   . Hyperlipidemia   . Hypertension     Past Surgical History:  Procedure Laterality Date  . BACK SURGERY    . SPINE SURGERY    . TRACHEAL SURGERY      Prior to Admission medications   Medication Sig Start Date End Date Taking? Authorizing Provider  atorvastatin (LIPITOR) 20 MG tablet Take 20 mg by mouth daily. 08/20/19   [provider]   hydrOXYzine (ATARAX) 10 MG/5ML syrup Take 5 mLs (10 mg total) by mouth 3 (three) times daily as needed for itching. 09/07/19   Sable Feil, PA-C  tadalafil (CIALIS) 5 MG tablet Take 1 tablet (5 mg total) by mouth daily as needed for erectile dysfunction. 08/30/19   Nori Riis, PA-C    Family History  Problem Relation Age of Onset  . Prostate cancer Neg Hx   . Bladder Cancer Neg Hx   . Kidney cancer Neg Hx      Social History   Tobacco Use  . Smoking status: Never Smoker  . Smokeless tobacco: Never Used  Substance Use Topics  . Alcohol use: No    Frequency: Never  . Drug use: No    Allergies as of 10/14/2019  . (No Known Allergies)    Review of Systems:    All systems reviewed and negative except where noted in HPI.   Physical Exam:  There were no vitals taken for this visit. No LMP for male patient. Psych:  Alert and cooperative. Normal mood and affect. General:   Alert,  Well-developed, well-nourished, pleasant and cooperative in NAD Head:  Normocephalic and atraumatic. Eyes:  Sclera clear, no icterus.   Conjunctiva pink. Ears:  Normal auditory acuity. Nose:  No deformity, discharge, or lesions. Mouth:  No deformity or lesions,oropharynx pink & moist. Neck:  Supple; no masses or thyromegaly. Lungs:  Respirations even and unlabored.  Clear throughout to auscultation.   No wheezes,  crackles, or rhonchi. No acute distress. Heart:  Regular rate and rhythm; no murmurs, clicks, rubs, or gallops. Abdomen:  Normal bowel sounds.  No bruits.  Soft, non-tender and non-distended without masses, hepatosplenomegaly or hernias noted.  No guarding or rebound tenderness.    Neurologic:  Alert and oriented x3;  grossly normal neurologically. Skin:  Intact without significant lesions or rashes. No jaundice. Lymph Nodes:  No significant cervical adenopathy. Psych:  Alert and cooperative. Normal mood and affect.  Imaging Studies: No results found.  Assessment and Plan:    Russell [name] is a 68 y.o. y/o male has been referred for a colonoscopy to evaluate colovesical fistula.  This was noted on a CT hematuria work-up.  This was performed after the patient noted that he had debris and feces in the urine.  A colovesicular fistula may develop from diverticulitis leading to a fistulous tract with the bladder or from possible underlying malignancy and at times from Crohn's disease.  I agree that he would require a colonoscopy in 6 to 8 weeks after the diverticulitis has completely healed to reduce the risk of perforation.  In addition I would also suggest prior to the colonoscopy to obtain an MRI fistula protocol to check for any other fistulous tracts seen in the pelvis which could point out towards other etiology such as Crohn's disease.  In addition I suggest we check his CBC, CMP.  Plan for colonoscopy in mid December to late December after the MRI.   I have discussed alternative options, risks & benefits,  which include, but are not limited to, bleeding, infection, perforation,respiratory complication & drug reaction.  The patient agrees with this plan & written consent will be obtained.     Follow up in 8 weeks  Dr [name] MD,MRCP(U.K)

## 2019-10-15 LAB — COMPREHENSIVE METABOLIC PANEL
ALT: 27 IU/L (ref 0–44)
AST: 16 IU/L (ref 0–40)
Albumin/Globulin Ratio: 1.7 (ref 1.2–2.2)
Albumin: 4.3 g/dL (ref 3.8–4.8)
Alkaline Phosphatase: 105 IU/L (ref 39–117)
BUN/Creatinine Ratio: 16 (ref 10–24)
BUN: 16 mg/dL (ref 8–27)
Bilirubin Total: 0.4 mg/dL (ref 0.0–1.2)
CO2: 21 mmol/L (ref 20–29)
Calcium: 9.7 mg/dL (ref 8.6–10.2)
Chloride: 103 mmol/L (ref 96–106)
Creatinine, Ser: 0.98 mg/dL (ref 0.76–1.27)
GFR calc Af Amer: 91 mL/min/{1.73_m2} (ref >59)
GFR calc non Af Amer: 78 mL/min/{1.73_m2} (ref >59)
Globulin, Total: 2.5 g/dL (ref 1.5–4.5)
Glucose: 127 mg/dL — ABNORMAL HIGH (ref 65–99)
Potassium: 4.2 mmol/L (ref 3.5–5.2)
Sodium: 143 mmol/L (ref 134–144)
Total Protein: 6.8 g/dL (ref 6.0–8.5)

## 2019-10-15 LAB — CBC WITH DIFFERENTIAL/PLATELET
Basophils Absolute: 0 10*3/uL (ref 0.0–0.2)
Basos: 1 %
EOS (ABSOLUTE): 0.2 10*3/uL (ref 0.0–0.4)
Eos: 3 %
Hematocrit: 45.9 % (ref 37.5–51.0)
Hemoglobin: 14.9 g/dL (ref 13.0–17.7)
Immature Grans (Abs): 0 10*3/uL (ref 0.0–0.1)
Immature Granulocytes: 0 %
Lymphocytes Absolute: 2 10*3/uL (ref 0.7–3.1)
Lymphs: 31 %
MCH: 29.6 pg (ref 26.6–33.0)
MCHC: 32.5 g/dL (ref 31.5–35.7)
MCV: 91 fL (ref 79–97)
Monocytes Absolute: 0.8 10*3/uL (ref 0.1–0.9)
Monocytes: 12 %
Neutrophils Absolute: 3.4 10*3/uL (ref 1.4–7.0)
Neutrophils: 53 %
Platelets: 208 10*3/uL (ref 150–450)
RBC: 5.03 x10E6/uL (ref 4.14–5.80)
RDW: 13.9 % (ref 11.6–15.4)
WBC: 6.3 10*3/uL (ref 3.4–10.8)

## 2019-11-01 ENCOUNTER — Other Ambulatory Visit: Payer: Self-pay

## 2019-11-01 ENCOUNTER — Ambulatory Visit
Admission: RE | Admit: 2019-11-01 | Discharge: 2019-11-01 | Disposition: A | Discharge status: Home / Self Care | Payer: Medicare Other | Source: Ambulatory Visit | Attending: Gastroenterology | Admitting: Gastroenterology

## 2019-11-01 ENCOUNTER — Other Ambulatory Visit
Admission: RE | Admit: 2019-11-01 | Discharge: 2019-11-01 | Disposition: A | Discharge status: Home / Self Care | Payer: Medicare Other | Source: Ambulatory Visit | Attending: Gastroenterology | Admitting: Gastroenterology

## 2019-11-01 DIAGNOSIS — Z20828 Contact with and (suspected) exposure to other viral communicable diseases: Secondary | ICD-10-CM | POA: Insufficient documentation

## 2019-11-01 DIAGNOSIS — R933 Abnormal findings on diagnostic imaging of other parts of digestive tract: Secondary | ICD-10-CM

## 2019-11-01 DIAGNOSIS — N321 Vesicointestinal fistula: Secondary | ICD-10-CM

## 2019-11-01 DIAGNOSIS — Z01812 Encounter for preprocedural laboratory examination: Secondary | ICD-10-CM | POA: Diagnosis present

## 2019-11-02 ENCOUNTER — Ambulatory Visit
Admission: RE | Admit: 2019-11-02 | Discharge: 2019-11-02 | Disposition: A | Discharge status: Home / Self Care | Payer: Medicare Other | Source: Ambulatory Visit | Attending: Gastroenterology | Admitting: Gastroenterology

## 2019-11-02 DIAGNOSIS — N321 Vesicointestinal fistula: Secondary | ICD-10-CM | POA: Insufficient documentation

## 2019-11-02 DIAGNOSIS — R933 Abnormal findings on diagnostic imaging of other parts of digestive tract: Secondary | ICD-10-CM | POA: Diagnosis present

## 2019-11-02 LAB — SARS CORONAVIRUS 2 (TAT 6-24 HRS): SARS Coronavirus 2: NEGATIVE

## 2019-11-02 MED ORDER — GADOBUTROL 1 MMOL/ML IV SOLN
9.0000 mL | Freq: Once | INTRAVENOUS | Status: AC | PRN
  Administered 2019-11-02: 9 mL via INTRAVENOUS

## 2019-11-05 ENCOUNTER — Ambulatory Visit: Payer: Medicare Other | Admitting: Anesthesiology

## 2019-11-05 ENCOUNTER — Encounter
Admission: RE | Disposition: A | Discharge status: Home / Self Care | Payer: Self-pay | Source: Home / Self Care | Attending: Gastroenterology

## 2019-11-05 ENCOUNTER — Encounter: Payer: Self-pay | Admitting: Gastroenterology

## 2019-11-05 ENCOUNTER — Other Ambulatory Visit: Payer: Self-pay

## 2019-11-05 ENCOUNTER — Ambulatory Visit
Admission: RE | Admit: 2019-11-05 | Discharge: 2019-11-05 | Disposition: A | Discharge status: Home / Self Care | Payer: Medicare Other | Attending: Gastroenterology | Admitting: Gastroenterology

## 2019-11-05 DIAGNOSIS — R935 Abnormal findings on diagnostic imaging of other abdominal regions, including retroperitoneum: Secondary | ICD-10-CM | POA: Diagnosis not present

## 2019-11-05 DIAGNOSIS — D122 Benign neoplasm of ascending colon: Secondary | ICD-10-CM | POA: Insufficient documentation

## 2019-11-05 DIAGNOSIS — K635 Polyp of colon: Secondary | ICD-10-CM

## 2019-11-05 DIAGNOSIS — E785 Hyperlipidemia, unspecified: Secondary | ICD-10-CM | POA: Insufficient documentation

## 2019-11-05 DIAGNOSIS — I10 Essential (primary) hypertension: Secondary | ICD-10-CM | POA: Diagnosis not present

## 2019-11-05 DIAGNOSIS — K5730 Diverticulosis of large intestine without perforation or abscess without bleeding: Secondary | ICD-10-CM | POA: Insufficient documentation

## 2019-11-05 DIAGNOSIS — R933 Abnormal findings on diagnostic imaging of other parts of digestive tract: Secondary | ICD-10-CM | POA: Diagnosis present

## 2019-11-05 HISTORY — PX: COLONOSCOPY WITH PROPOFOL: SHX5780

## 2019-11-05 SURGERY — COLONOSCOPY WITH PROPOFOL
Anesthesia: General

## 2019-11-05 MED ORDER — PROPOFOL 10 MG/ML IV BOLUS
INTRAVENOUS | Status: DC | PRN
  Administered 2019-11-05: 80 mg via INTRAVENOUS

## 2019-11-05 MED ORDER — PHENYLEPHRINE HCL (PRESSORS) 10 MG/ML IV SOLN
INTRAVENOUS | Status: DC | PRN
  Administered 2019-11-05: 100 ug via INTRAVENOUS

## 2019-11-05 MED ORDER — SODIUM CHLORIDE 0.9 % IV SOLN
INTRAVENOUS | Status: DC
  Administered 2019-11-05: 1000 mL via INTRAVENOUS

## 2019-11-05 MED ORDER — PROPOFOL 500 MG/50ML IV EMUL
INTRAVENOUS | Status: DC | PRN
  Administered 2019-11-05: 130 ug/kg/min via INTRAVENOUS

## 2019-11-05 MED ORDER — PROPOFOL 500 MG/50ML IV EMUL
INTRAVENOUS | Status: AC
  Filled 2019-11-05: qty 50

## 2019-11-05 NOTE — Anesthesia Post-op Follow-up Note (Signed)
Anesthesia QCDR form completed.        

## 2019-11-05 NOTE — Anesthesia Postprocedure Evaluation (Signed)
Anesthesia Post Note  Patient: Russell [name]  Procedure(s) Performed: COLONOSCOPY WITH PROPOFOL (N/A )  Patient location during evaluation: Endoscopy Anesthesia Type: General Level of consciousness: awake and alert and oriented Pain management: pain level controlled Vital Signs Assessment: post-procedure vital signs reviewed and stable Respiratory status: spontaneous breathing Cardiovascular status: blood pressure returned to baseline Anesthetic complications: no     Last Vitals:  Vitals:   11/05/19 0931 11/05/19 0941  BP: 93/73 101/64  Pulse: 82   Resp: 18   Temp: (!) 36.1 C   SpO2: 95%     Last Pain:  Vitals:   11/05/19 0951  TempSrc:   PainSc: 0-No pain                 Kainan Patty

## 2019-11-05 NOTE — Progress Notes (Signed)
Informed patient of the results

## 2019-11-05 NOTE — Anesthesia Preprocedure Evaluation (Signed)
Anesthesia Evaluation  Patient identified by [name], date of birth, ID band Patient awake    Reviewed: Allergy & Precautions, NPO status , Patient's Chart, lab work & pertinent test results  Airway Mallampati: II  TM Distance: >3 FB     Dental  (+) Chipped   Pulmonary  Chronic cough   Pulmonary exam normal        Cardiovascular hypertension, Normal cardiovascular exam     Neuro/Psych  Neuromuscular disease    GI/Hepatic Neg liver ROS, GERD  ,  Endo/Other  negative endocrine ROS  Renal/GU Hx of pyuria     Musculoskeletal  (+) Arthritis ,   Abdominal Normal abdominal exam  (+)   Peds negative pediatric ROS (+)  Hematology  (+) anemia ,   Anesthesia Other Findings Past Medical History: No date: Arthritis No date: Hyperlipidemia No date: Hypertension  Reproductive/Obstetrics                             Anesthesia Physical Anesthesia Plan  ASA: III  Anesthesia Plan: General   Post-op Pain Management:    Induction: Intravenous  PONV Risk Score and Plan:   Airway Management Planned: Nasal Cannula  Additional Equipment:   Intra-op Plan:   Post-operative Plan:   Informed Consent: I have reviewed the patients History and Physical, chart, labs and discussed the procedure including the risks, benefits and alternatives for the proposed anesthesia with the patient or authorized representative who has indicated his/her understanding and acceptance.     Dental advisory given  Plan Discussed with: CRNA and Surgeon  Anesthesia Plan Comments:         Anesthesia Quick Evaluation

## 2019-11-05 NOTE — Op Note (Signed)
[facility] Gastroenterology Patient Name: Russell [name] Procedure Date: 11/05/2019 8:57 AM [name]: [id number] Account #: [id number] Date of Birth: [birth year] Admit Type: Outpatient Age: 68 Room: [facility] ENDO ROOM 1 Gender: Male Note Status: Finalized Procedure:             Colonoscopy Indications:           Abnormal MRI of the GI tract Providers:             [name] MD, MD Medicines:             Monitored Anesthesia Care Complications:         No immediate complications. Procedure:             Pre-Anesthesia Assessment:                        - Prior to the procedure, a History and Physical was                         performed, and patient medications, allergies and                         sensitivities were reviewed. The patient's tolerance                         of previous anesthesia was reviewed.                        - The risks and benefits of the procedure and the                         sedation options and risks were discussed with the                         patient. All questions were answered and informed                         consent was obtained.                        - ASA Grade Assessment: III - A patient with severe                         systemic disease.                        After obtaining informed consent, the colonoscope was                         passed under direct vision. Throughout the procedure,                         the patient's blood pressure, pulse, and oxygen                         saturations were monitored continuously. The                         Colonoscope was introduced through the anus and  advanced to the the cecum, identified by the                         appendiceal orifice. The colonoscopy was performed                         with ease. The patient tolerated the procedure well.                         The quality of the bowel preparation was good. Findings:      The perianal and  digital rectal examinations were normal.      A 5 mm polyp was found in the ascending colon. The polyp was sessile.       The polyp was removed with a cold snare. Resection and retrieval were       complete.      Multiple small-mouthed diverticula were found in the entire colon.      The entire examined colon appeared normal on direct and retroflexion       views. Impression:            - One 5 mm polyp in the ascending colon, removed with                         a cold snare. Resected and retrieved.                        - Diverticulosis in the entire examined colon.                        - The entire examined colon is normal on direct and                         retroflexion views. Recommendation:        - Discharge patient to home (with escort).                        - Resume previous diet.                        - Continue present medications.                        - Await pathology results.                        - Repeat colonoscopy for surveillance based on                         pathology results. Procedure Code(s):     --- Professional ---                        [phone], Colonoscopy, flexible; with removal of                         tumor(s), polyp(s), or other lesion(s) by snare                         technique Diagnosis Code(s):     --- Professional ---  K63.5, Polyp of colon                        K57.30, Diverticulosis of large intestine without                         perforation or abscess without bleeding                        R93.3, Abnormal findings on diagnostic imaging of                         other parts of digestive tract CPT copyright 2019 American Medical Association. All rights reserved. The codes documented in this report are preliminary and upon coder review may  be revised to meet current compliance requirements. [name], MD [name] MD, MD 11/05/2019 9:28:34 AM This report has been signed electronically. Number of  Addenda: 0 Note Initiated On: 11/05/2019 8:57 AM Scope Withdrawal Time: 0 hours 16 minutes 39 seconds  Total Procedure Duration: 0 hours 19 minutes 9 seconds  Estimated Blood Loss:  Estimated blood loss: none.      [facility]

## 2019-11-05 NOTE — H&P (Signed)
[name], MD [address] [place], [place], [place], [address] [address], [place], [place], [place], [address] Phone: [phone]  Fax: [phone]  Primary Care Physician:  System, Pcp Not In   Pre-Procedure History & Physical: HPI:  Russell [name] is a 68 y.o. male is here for an colonoscopy.   Past Medical History:  Diagnosis Date  . Arthritis   . Hyperlipidemia   . Hypertension     Past Surgical History:  Procedure Laterality Date  . BACK SURGERY    . SPINE SURGERY    . TRACHEAL SURGERY      Prior to Admission medications   Medication Sig Start Date End Date Taking? Authorizing Provider  atorvastatin (LIPITOR) 20 MG tablet Take 20 mg by mouth daily. 08/20/19  Yes [provider]  hydrOXYzine (ATARAX) 10 MG/5ML syrup Take 5 mLs (10 mg total) by mouth 3 (three) times daily as needed for itching. 09/07/19  Yes Sable Feil, PA-C  tadalafil (CIALIS) 5 MG tablet Take 1 tablet (5 mg total) by mouth daily as needed for erectile dysfunction. Patient not taking: Reported on 10/14/2019 08/30/19   Zara Council A, PA-C    Allergies as of 10/15/2019  . (No Known Allergies)    Family History  Problem Relation Age of Onset  . Prostate cancer Neg Hx   . Bladder Cancer Neg Hx   . Kidney cancer Neg Hx     Social History   Socioeconomic History  . Marital status: Married    Spouse name: Not on file  . Number of children: Not on file  . Years of education: Not on file  . Highest education level: Not on file  Occupational History  . Not on file  Tobacco Use  . Smoking status: Never Smoker  . Smokeless tobacco: Never Used  Substance and Sexual Activity  . Alcohol use: No  . Drug use: No  . Sexual activity: Yes    Birth control/protection: None  Other Topics Concern  . Not on file  Social History Narrative  . Not on file   Social Determinants of Health   Financial Resource Strain:   . Difficulty of Paying Living Expenses: Not on  file  Food Insecurity:   . Worried About Charity fundraiser in the Last Year: Not on file  . Ran Out of Food in the Last Year: Not on file  Transportation Needs:   . Lack of Transportation (Medical): Not on file  . Lack of Transportation (Non-Medical): Not on file  Physical Activity:   . Days of Exercise per Week: Not on file  . Minutes of Exercise per Session: Not on file  Stress:   . Feeling of Stress : Not on file  Social Connections:   . Frequency of Communication with Friends and Family: Not on file  . Frequency of Social Gatherings with Friends and Family: Not on file  . Attends Religious Services: Not on file  . Active Member of Clubs or Organizations: Not on file  . Attends Archivist Meetings: Not on file  . Marital Status: Not on file  Intimate Partner Violence:   . Fear of Current or Ex-Partner: Not on file  . Emotionally Abused: Not on file  . Physically Abused: Not on file  . Sexually Abused: Not on file    Review of Systems: See HPI, otherwise negative ROS  Physical Exam: There were no vitals taken for this visit. General:   Alert,  pleasant and cooperative in NAD Head:  Normocephalic and atraumatic. Neck:  Supple; no masses or thyromegaly. Lungs:  Clear throughout to auscultation, normal respiratory effort.    Heart:  +S1, +S2, Regular rate and rhythm, No edema. Abdomen:  Soft, nontender and nondistended. Normal bowel sounds, without guarding, and without rebound.   Neurologic:  Alert and  oriented x4;  grossly normal neurologically.  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  Questions have been answered.  All parties agreeable.   [name], MD  11/05/2019, 8:24 AM

## 2019-11-05 NOTE — Transfer of Care (Signed)
Immediate Anesthesia Transfer of Care Note  Patient: Russell [name]  Procedure(s) Performed: COLONOSCOPY WITH PROPOFOL (N/A )  Patient Location: Endoscopy Unit  Anesthesia Type:General  Level of Consciousness: drowsy and patient cooperative  Airway & Oxygen Therapy: Patient Spontanous Breathing  Post-op Assessment: Report given to RN and Post -op Vital signs reviewed and stable  Post vital signs: Reviewed and stable  Last Vitals:  Vitals Value Taken Time  BP 101/64 11/05/19 0942  Temp 36.1 C 11/05/19 0931  Pulse 83 11/05/19 0943  Resp 22 11/05/19 0943  SpO2 96 % 11/05/19 0943  Vitals shown include unvalidated device data.  Last Pain:  Vitals:   11/05/19 0941  TempSrc:   PainSc: 0-No pain         Complications: No apparent anesthesia complications

## 2019-11-06 ENCOUNTER — Telehealth: Payer: Self-pay | Admitting: Surgery

## 2019-11-06 ENCOUNTER — Encounter: Payer: Self-pay | Admitting: *Deleted

## 2019-11-06 LAB — SURGICAL PATHOLOGY

## 2019-11-06 NOTE — Telephone Encounter (Signed)
Patient is asking for one of the nurses to give him a call would like to discuss something's. Please call patient and advise

## 2019-11-06 NOTE — Telephone Encounter (Signed)
Message left for the patient to call us back.

## 2019-11-08 ENCOUNTER — Ambulatory Visit: Payer: Medicare Other

## 2019-11-12 ENCOUNTER — Ambulatory Visit (INDEPENDENT_AMBULATORY_CARE_PROVIDER_SITE_OTHER): Payer: Medicare Other | Admitting: Surgery

## 2019-11-12 ENCOUNTER — Other Ambulatory Visit: Payer: Self-pay

## 2019-11-12 ENCOUNTER — Encounter: Payer: Self-pay | Admitting: Surgery

## 2019-11-12 VITALS — BP 142/80 | HR 102 | Temp 98.6°F | Ht 71.0 in | Wt 215.0 lb

## 2019-11-12 DIAGNOSIS — N321 Vesicointestinal fistula: Secondary | ICD-10-CM

## 2019-11-12 MED ORDER — NEOMYCIN SULFATE 500 MG PO TABS: ORAL_TABLET | ORAL | 0 refills | Status: DC

## 2019-11-12 MED ORDER — POLYETHYLENE GLYCOL 3350 17 GM/SCOOP PO POWD: ORAL | 0 refills | Status: DC

## 2019-11-12 MED ORDER — BISACODYL 5 MG PO TBEC: DELAYED_RELEASE_TABLET | ORAL | 0 refills | Status: DC

## 2019-11-12 MED ORDER — ERYTHROMYCIN BASE 500 MG PO TABS: ORAL_TABLET | ORAL | 0 refills | Status: DC

## 2019-11-12 NOTE — Progress Notes (Signed)
Request for Medical Clearance sent to Dr Reino Bellis at [facility]: [phone] Fax: [phone]

## 2019-11-12 NOTE — Progress Notes (Signed)
11/13/2019  History of Present Illness: Russell [name] is a 68 y.o. male presenting for follow up of a colovesical fistula.  He was last seen on 11/3.  He has since seen GI and had a colonoscopy as well as a pelvic MRI to further evaluate his fistula.  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  No additional findings on his colonoscopy except for a 5 mm polyp on his ascending colon.  He reports that he still has pneumaturia and fecaluria, but denies any significant pelvic discomfort that he used to have on his last visit.  Denies any abdominal pain, nausea, vomiting, or changes in his stool.    Past Medical History: Past Medical History:  Diagnosis Date  . Arthritis   . Back pain   . 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 Hyperlipidemia   . Hypertension      Past Surgical History: Past Surgical History:  Procedure Laterality Date  . BACK SURGERY    . COLONOSCOPY WITH PROPOFOL N/A 11/05/2019   Procedure: COLONOSCOPY WITH PROPOFOL;  Surgeon: [name], MD;  Location: [facility] ENDOSCOPY;  Service: Gastroenterology;  Laterality: N/A;  . SPINE SURGERY    . TRACHEAL SURGERY      Home Medications: Prior to Admission medications   Medication Sig Start Date End Date Taking? Authorizing Provider  atorvastatin (LIPITOR) 20 MG tablet Take 20 mg by mouth daily. 08/20/19  Yes [provider]  tadalafil (CIALIS) 5 MG tablet Take 1 tablet (5 mg total) by mouth daily as needed for erectile dysfunction. 08/30/19  Yes McGowan, Larene Beach A, PA-C  bisacodyl (DULCOLAX) 5 MG EC tablet Take all 4 tablets at 8 am the morning prior to your surgery. 11/12/19   Olean Ree, MD  erythromycin base (E-MYCIN) 500 MG tablet Take 2 tablets at 8am, 2 tablets at 2pm, and 2 tablets at 8pm the day prior to surgery. 11/12/19   Olean Ree, MD  neomycin (MYCIFRADIN) 500 MG tablet Take 2 tablet at 8am, take 2 tablets at 2pm, and take 2  tablets at 8pm the day prior to your surgery 11/12/19   Olean Ree, MD  polyethylene glycol powder (MIRALAX) 17 GM/SCOOP powder Mix full container in 64 ounces of Gatorade or other clear liquid. NO Red Liquids. 11/12/19   Olean Ree, MD    Allergies: No Known Allergies  Review of Systems: Review of Systems  Constitutional: Negative for chills and fever.  HENT: Negative for hearing loss.   Respiratory: Negative for shortness of breath.   Cardiovascular: Negative for chest pain.  Gastrointestinal: Negative for abdominal pain, nausea and vomiting.  Genitourinary:       Fecaluria, pneumaturia  Musculoskeletal: Positive for back pain (chronic).  Skin: Negative for rash.  Neurological: Negative for dizziness.  Psychiatric/Behavioral: Negative for depression.    Physical Exam BP (!) 142/80   Pulse (!) 102   Temp 98.6 F (37 C)   Ht 5\' 11"  (1.803 m)   Wt 97.5 kg   SpO2 95%   BMI 29.99 kg/m  CONSTITUTIONAL: No acute distress HEENT:  Normocephalic, atraumatic, extraocular motion intact. RESPIRATORY:  Lungs are clear, and breath sounds are equal bilaterally. Normal respiratory effort without pathologic use of accessory muscles. CARDIOVASCULAR: Heart is regular without murmurs, gallops, or rubs. GI: The abdomen is soft, non-distended, non-tender to palpation.  NEUROLOGIC:  Motor and sensation is grossly normal.  Cranial nerves are grossly intact. PSYCH:  Alert  and oriented to person, place and time. Affect is normal.  Labs/Imaging: MRI pelvis 11/02/19: IMPRESSION: 1. There is a contrast enhancing fistula tract between the inferior aspect of the mid sigmoid and the left aspect of the bladder dome measuring approximately 2 cm in length (series 20, image 39, series 25, image 47).  Findings are in keeping with those seen on prior CT dated 09/06/2019. 2. Severe sigmoid diverticulosis without evidence of acute diverticulitis at this time. 3. Mild bladder wall thickening, which may  be due to chronic outlet obstruction and/or cystitis in the setting of colonic fistula. 4. Mild prostatomegaly.   Assessment and Plan: This is a 68 y.o. male with a colovesical fistula.  --Discussed with the patient that in the ideal scenario, we could take him to the OR for open sigmoidectomy with colovesical fistula takedown.  However, now with rising numbers in [place], the hospital has currently a moratorium on all elective cases that require admission in order to free up beds for COVID patients.  At this point, we will have to postpone the surgery.  For now, we will tentatively schedule him for 12/10/19, but the patient understands that it may have to be postponed further if the moratorium gets extended. --For now, did discuss with him the surgery at length, removing the sigmoid colon with primary anastomosis, taking down the fistula and repairing the bladder.  Discussed with him the the goal is for primary anastomosis, but there is a possibility that he may require either an end colostomy or diverting loop ileostomy that would be temporary.  Discussed with him the risks of bleeding, infection, injury to surrounding structures, and he's willing to proceed.  Discussed with him also the post-op recovery timeline, activity restrictions, the possibility for rehab vs home health needs. --He also understands that he would need to get tested for COVID prior to surgery and he would also need the bowel prep as well.  We will also send medical clearance to his PCP.  Face-to-face time spent with the patient and care providers was 25 minutes, with more than 50% of the time spent counseling, educating, and coordinating care of the patient.     Melvyn Neth, [facility]

## 2019-11-12 NOTE — Patient Instructions (Addendum)
lo llamaremos para revisar la informacin de su ciruga  Your surgery is scheduled for January 19th 2021. You will need to make use of a bowel prep the day prior to surgery.  Our surgery scheduler will call you to review your surgery information and let you know about Covid testing and Pre Admit testing.     Colectoma abierta Open Colectomy Una colectoma abierta es una ciruga que se realiza para extirpar todo el intestino grueso (colon) o parte de [place]. Este procedimiento se [place] para tratar varias afecciones, incluidas las siguientes:  Inflamacin e infeccin del colon (diverticulitis).  Tumores o masas tumorales en el colon.  Enfermedad inflamatoria del intestino, como la enfermedad de Crohn o la colitis ulcerosa.  Hemorragia del colon.  Bloqueo u obstruccin del colon. Informe al mdico acerca de lo siguiente:  Cualquier alergia que tenga.  Todos los [employer], incluidos vitaminas, hierbas, gotas oftlmicas, cremas y medicamentos de venta libre.  Cualquier problema previo que usted o los miembros de su familia hayan tenido con anestsicos.  Cualquier enfermedad de la sangre que tenga.  Cirugas previas a las que se someti.  Cualquier enfermedad que tenga.  Si est embarazada o podra estarlo.  Si fuma o consume productos que contengan tabaco. Estos pueden afectar la reaccin del cuerpo a la anestesia. Cules son los riesgos? En general, se trata de un procedimiento seguro. Sin embargo, pueden ocurrir complicaciones, por ejemplo:  Infeccin.  Hemorragia.  Reacciones alrgicas a los medicamentos.  Daos a Catering manager u otros rganos.  Neumona.  La incisin se abre.  Tejidos del interior del abdomen que sobresalen a travs de la incisin (hernia).  Reapertura del colon en donde se colocaron los puntos o las grapas.  Un cogulo sanguneo que se forma en una vena y se traslada [employer].  Obstruccin futura del intestino  delgado debido al tejido cicatricial. Qu ocurre antes del procedimiento? Mantenerse hidratado Siga las indicaciones del mdico acerca de la hidratacin, las cuales pueden incluir lo siguiente:  Hasta 2horas antes del procedimiento, puede beber lquidos transparentes, como agua, jugos frutales transparentes, caf negro y t solo. Restricciones en las comidas y bebidas Siga las indicaciones del mdico respecto de las comidas y bebidas, las cuales pueden incluir lo siguiente:  Ocho horas antes del procedimiento, deje de ingerir comidas o alimentos pesados, por ejemplo, carne, alimentos fritos o alimentos grasos.  Seis horas antes del procedimiento, deje de ingerir comidas o alimentos livianos, como tostadas o cereales.  Seis horas antes del procedimiento, deje de beber [place] o bebidas que [employer].  Dos horas antes del procedimiento, deje de beber lquidos transparentes. Preparado intestinal En algunos casos, es posible que le receten un preparado intestinal por va oral para limpiar el colon. En este caso:  Tmelo como se lo haya indicado el mdico. Desde el da anterior al procedimiento, tendr que beber una gran cantidad de lquido medicinal. El lquido har que elimine muchas heces blandas hasta que sean casi claras o de color verdoso claro.  Siga las indicaciones del mdico respecto de las restricciones de comidas y bebidas mientras consume el preparado intestinal. Medicamentos  Consulte al mdico si debe hacer o no lo siguiente: ? Quarry manager o suspender los medicamentos o las vitaminas que toma habitualmente. Esto es muy importante si toma medicamentos para la diabetes, anticoagulantes o vitamina E. ? Tomar medicamentos como aspirina e ibuprofeno. Estos medicamentos pueden tener un efecto anticoagulante en la [place]. No tome estos medicamentos antes del procedimiento si  su mdico le indica que no lo haga.  Si le recetaron un antibitico, tmelo como se lo haya indicado el  mdico. Instrucciones generales  Traiga ropa holgada y cmoda, y calzado fcil de poner para que pueda cambiarse sin Office manager.  Consulte a su mdico para saber qu pruebas debe realizarse antes del procedimiento, por ejemplo: ? Anlisis de [place]. ? Un estudio para controlar el ritmo cardaco (electrocardiograma, ECG). ? Una exploracin por tomografa computarizada (TC) del abdomen. ? Anlisis de [place]. ? Colonoscopia.  Haga planes para que una persona lo lleve a su casa desde el hospital o la clnica.  Tambin pdale a alguien que lo ayude con sus [employer] se recupera. Qu ocurre durante el procedimiento?  Para disminuir el riesgo de contraer una infeccin: ? El equipo mdico se lavar o se Transport planner. ? Le lavarn la piel con jabn. ? Pueden rasurarle el rea alrededor del lugar de la ciruga.  Le colocarn un tubo (catter) intravenoso en una de las venas. El tubo se usar para administrarle ms medicamentos y lquidos.  Le administrarn un medicamento para hacerlo dormir (anestesia general). Tambin le administrarn un medicamento para ayudarlo a relajarse (sedante).  Le colocarn pequeos monitores en el cuerpo. Ellos controlarn el corazn, la presin arterial y el nivel de oxgeno.  Durante el procedimiento, posiblemente le coloquen un respirador.  Le insertarn un tubo delgado y flexible (catter) en la vejiga para drenar la orina.  Pueden introducirle un tubo por la nariz hasta el estmago (sonda nasogstrica o sonda NG). Este se [place] para eliminar los jugos gstricos del estmago despus de la ciruga hasta que los intestinos comiencen a funcionar nuevamente.  Le realizarn una incisin en el abdomen.  Le colocarn pinzas o grapas en el colon.  Luego se extirpa la parte del colon entre las pinzas o grapas.  Se unirn con puntos o grapas los extremos restantes del colon.  La incisin en el abdomen se cerrar con puntos (suturas) o  grapas.  La incisin puede cubrirse con una venda (vendaje).  Le realizarn una pequea incisin (estoma) en la parte baja del abdomen. Unirn una bolsa externa y desmontable (bolsa colectora) al estoma. Mediante esta bolsa, se eliminarn las heces de su organismo. En lugar de pasar por el ano, las heces pasan por el estoma hacia la bolsa. Este procedimiento puede variar segn el mdico y el hospital. Sander Nephew sucede despus del procedimiento?  Le controlarn la presin arterial, la frecuencia cardaca, la frecuencia respiratoria y Retail buyer de oxgeno en la sangre hasta que desaparezca el efecto de los medicamentos administrados.  Puede seguir recibiendo lquidos o medicamentos por el tubo (catter) intravenoso.  Comenzar a tomar lquidos claros y Financial planner dieta normal de manera gradual.  No conduzca hasta que el mdico lo autorice.  Es posible que sienta dolor en el abdomen. Le darn analgsicos para Financial controller.  Le recomendarn hacer lo siguiente: ? Practicar ejercicios de respiracin para prevenir la neumona. ? Levantarse y Writer en el trmino un da despus de la [place]. Debe tratar de levantarse entre 5 y 6veces por da. Esta informacin no tiene Marine scientist el consejo del mdico. Asegrese de hacerle al mdico cualquier pregunta que tenga. Document Released: 05/23/2011 Document Revised: 04/25/2017 Document Reviewed: 05/11/2016 Elsevier Patient Education  2020 [employer].

## 2019-11-13 ENCOUNTER — Encounter: Payer: Self-pay | Admitting: Surgery

## 2019-11-24 ENCOUNTER — Encounter: Payer: Self-pay | Admitting: Gastroenterology

## 2019-11-26 NOTE — Progress Notes (Signed)
Called the office of Dr Reino Bellis and they did not receive the fax for medical clearance. I have re faxed this to their office.

## 2019-12-03 ENCOUNTER — Telehealth: Payer: Self-pay | Admitting: Surgery

## 2019-12-03 NOTE — Telephone Encounter (Signed)
Patient stopped by the office to discuss surgical date, pre-admit dates & COVID testing dates.  He is aware that due to recent COVID restrictions, his surgery is being put on hold until further notice.  Once the restrictions have been lifted (elective & overnight stay/surgery admit) he will be contacted to reschedule.  Patient acknowledged understanding & thanked.  Interpreter used was Harvin Hazel, [place].

## 2019-12-04 ENCOUNTER — Telehealth: Payer: Self-pay | Admitting: Emergency Medicine

## 2019-12-04 ENCOUNTER — Telehealth: Payer: Self-pay

## 2019-12-04 NOTE — Telephone Encounter (Signed)
Called patient to make him aware that he needs to make appointment with Dr. Reino Bellis. He did not answer left vm.  Called daughter Jayde Freeby) and spoke to her. She states that she will get in contact with Dr. Marguerita Beards office to scheduler her dad an appointment to get medical clearance.

## 2019-12-04 NOTE — Telephone Encounter (Signed)
Spoke with Shirlee Limerick -patient needs pre-op appointment with Dr.Lee prior to clearance being completed -Almyra Free will call patient to let him know he needs to call and schedule an appointment.

## 2019-12-05 ENCOUNTER — Other Ambulatory Visit: Payer: Medicare Other

## 2019-12-05 ENCOUNTER — Other Ambulatory Visit: Admission: RE | Admit: 2019-12-05 | Payer: Medicare Other | Source: Ambulatory Visit

## 2019-12-06 ENCOUNTER — Other Ambulatory Visit: Payer: Medicare Other

## 2019-12-06 ENCOUNTER — Other Ambulatory Visit: Admission: RE | Admit: 2019-12-06 | Payer: Medicare Other | Source: Ambulatory Visit

## 2019-12-12 ENCOUNTER — Telehealth: Payer: Self-pay | Admitting: Emergency Medicine

## 2019-12-12 NOTE — Telephone Encounter (Signed)
Pt came to the clinic stating if his surgery is still scheduled for February due to no one calling him. I stated to patient both Russell [name] and myself spoke to him in person a few weeks ago when he came to the clinic that his surgery was postponed due to Covid restrictions.  I explained to patient we have tried contacting him but with no answer so we have been contacting daughter Russell [name]. I explained to pt that we still do not have a new surgery date. I also explained that we need Medical Clearance first before surgery can be continued. I stated to patient that Russell [name] knows this information and that she was to rely this to him. Patient did state he has appt with Dr. Truman Hayward tomorrow and that he was going to speak to daughter. I advised pt that when we do received Medical Clearance and a Surgery Date we will contact him. Pt verbalized understanding and no further questions at this time.

## 2019-12-12 NOTE — Telephone Encounter (Signed)
Called patients daughter Dawson Bills) to follow up on visit with Dr. Truman Hayward for Medical Clearance.   Per daughter pt has appt with Dr. Truman Hayward tomorrow 12/13/19.

## 2019-12-17 ENCOUNTER — Telehealth: Payer: Self-pay | Admitting: Emergency Medicine

## 2019-12-17 NOTE — Telephone Encounter (Signed)
Called Dr. Marguerita Beards office in regards to patients clearance form. Spoke to [place], she stated pt was seen last week. Per Stanton Kidney, she will speak to Dr. Truman Hayward in regards to clearance form and fax it back to [place] as soon as she gets it.  Stanton Kidney stated she will call our office if she has any questions or if she needs a clearance form.

## 2019-12-24 ENCOUNTER — Inpatient Hospital Stay: Admission: RE | Admit: 2019-12-24 | Payer: Medicare Other | Source: Home / Self Care | Admitting: Surgery

## 2019-12-24 ENCOUNTER — Encounter: Admission: RE | Payer: Self-pay | Source: Home / Self Care

## 2019-12-24 SURGERY — LAPAROSCOPIC PARTIAL COLECTOMY
Anesthesia: General

## 2019-12-30 ENCOUNTER — Telehealth: Payer: Self-pay | Admitting: Emergency Medicine

## 2019-12-30 NOTE — Telephone Encounter (Signed)
I called over to Dr. Marguerita Beards office in regards to pt medical clearance; spoke to [facility]. Mordecai Rasmussen states on Feb 1 they were waiting for labs and on Feb 5 they are waiting for results on Chest Xray. Advised Mordecai Rasmussen if they can send over clearance form when possible to get pt schedule for sx.

## 2020-01-07 NOTE — Progress Notes (Signed)
Per Dr Elise Benne office the patient is going to be seen by Cardiology on 01/07/20.

## 2020-01-10 ENCOUNTER — Telehealth: Payer: Self-pay | Admitting: Emergency Medicine

## 2020-01-10 ENCOUNTER — Telehealth: Payer: Self-pay

## 2020-01-10 NOTE — Telephone Encounter (Signed)
Spoke with Russell [name] at Dr.Duron De Blanch to provide medical clearance- patient referred to [place] [phone]   Spoke with Russell [name] at [facility] seen 01/07/2020 is scheduled to have Nuclear stress test 01/13/20 with a follow up to see Dr.Guterrez 02/04/20.  Russell [name] stated Dr.Gutterrez may have clearance completed after Monday. Patient is scheduled to see Dr.Piscoya 01/17/20.   Cardiac Clearance faxed to [employer] @ [facility] [phone] at this time.

## 2020-01-10 NOTE — Telephone Encounter (Signed)
Pt called to the clinic stating he has to cancel Mondays appt 2/22 due to seeing the Cardiologist again. States he went Tuesday 2/16 but drank caffeine and they were not able to do "a test". SO he has to go back on Monday. Pt rescheduled for Friday 2/26 at 930am with Dr Hampton Abbot.

## 2020-01-10 NOTE — Telephone Encounter (Signed)
Cardiac Clearance faxed to [place] at [facility] [phone] at this time. Patient having nuclear stress test 01/13/20.

## 2020-01-13 ENCOUNTER — Ambulatory Visit: Payer: Medicare Other | Admitting: Surgery

## 2020-01-14 ENCOUNTER — Encounter: Payer: Self-pay | Admitting: Gastroenterology

## 2020-01-14 ENCOUNTER — Ambulatory Visit: Payer: Medicare Other | Admitting: Gastroenterology

## 2020-01-15 ENCOUNTER — Telehealth: Payer: Self-pay

## 2020-01-15 NOTE — Telephone Encounter (Signed)
Message left for [facility] checking on the status of the patient's Cardiac Clearance.

## 2020-01-17 ENCOUNTER — Other Ambulatory Visit: Payer: Self-pay

## 2020-01-17 ENCOUNTER — Encounter: Payer: Self-pay | Admitting: Surgery

## 2020-01-17 ENCOUNTER — Telehealth: Payer: Self-pay

## 2020-01-17 ENCOUNTER — Ambulatory Visit (INDEPENDENT_AMBULATORY_CARE_PROVIDER_SITE_OTHER): Payer: Medicare Other | Admitting: Surgery

## 2020-01-17 VITALS — BP 121/81 | HR 96 | Temp 97.7°F | Resp 12 | Ht 71.0 in | Wt 209.0 lb

## 2020-01-17 DIAGNOSIS — N321 Vesicointestinal fistula: Secondary | ICD-10-CM | POA: Diagnosis not present

## 2020-01-17 NOTE — Patient Instructions (Addendum)
Our surgery scheduler will call you within 24-48 hours to schedule your surgery. Please have the [place] surgery sheet available when speaking with her.   Please follow the Bowel Prep Instructions. Please pick up your medications at the pharmacy.      Please call our office if you have questions or concerns.

## 2020-01-17 NOTE — H&P (View-Only) (Signed)
01/17/2020  History of Present Illness: Russell [name] is a 69 y.o. male presenting for H&P update.  Has a history of colovesical fistula and has been worked up by both Urology and GI.  He had a cystoscopy on 09/13/19 which diagnosed the fistula.  He was seen by GI and had a pelvic MRI on 11/03/19 showing the fistula between sigmoid colon and bladder dome, without any abscess.  He had a colonoscopy on 11/05/19 which did not show any evidence of malignancy.  Unfortunately, due to COVID restrictions on surgical cases in the hospital, his surgery had to be deferred.  In the meantime, he has been trying to get medical and cardiology clearance.  He is followed up by his PCP, Dr. Reino Bellis, and his Cardiologist, Dr. Kenna Gilbert.  He had a stress test this week and is awaiting the results.  He has a follow up with Dr. Danise Mina on 02/04/20.  Since his last visit with [place], he reports that he's been doing well.  He denies any worsening pain, fevers, or chills.  He reports he still has pneumaturia or fecaluria, and has the same mild discomfort in the low pelvis.    Past Medical History: Past Medical History:  Diagnosis Date  . Arthritis   . Back pain   . 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 Hyperlipidemia   . Hypertension      Past Surgical History: Past Surgical History:  Procedure Laterality Date  . BACK SURGERY    . COLONOSCOPY WITH PROPOFOL N/A 11/05/2019   Procedure: COLONOSCOPY WITH PROPOFOL;  Surgeon: [name], MD;  Location: [facility] ENDOSCOPY;  Service: Gastroenterology;  Laterality: N/A;  . SPINE SURGERY    . TRACHEAL SURGERY      Home Medications: Prior to Admission medications   Medication Sig Start Date End Date Taking? Authorizing Provider  atorvastatin (LIPITOR) 20 MG tablet Take 20 mg by mouth daily. 08/20/19  Yes [provider]  bisacodyl (DULCOLAX) 5 MG EC tablet Take all 4 tablets at 8 am the morning prior to your surgery. 11/12/19  Yes Doyel Mulkern, Jacqulyn Bath, MD  doxycycline  (VIBRA-TABS) 100 MG tablet Take 100 mg by mouth 2 (two) times daily.   Yes [provider]  erythromycin base (E-MYCIN) 500 MG tablet Take 2 tablets at 8am, 2 tablets at 2pm, and 2 tablets at 8pm the day prior to surgery. 11/12/19  Yes Deyton Ellenbecker, Jacqulyn Bath, MD  neomycin (MYCIFRADIN) 500 MG tablet Take 2 tablet at 8am, take 2 tablets at 2pm, and take 2 tablets at 8pm the day prior to your surgery 11/12/19  Yes Marlyne Totaro, MD  polyethylene glycol powder (MIRALAX) 17 GM/SCOOP powder Mix full container in 64 ounces of Gatorade or other clear liquid. NO Red Liquids. 11/12/19  Yes Kailash Hinze, Jacqulyn Bath, MD  tadalafil (CIALIS) 5 MG tablet Take 1 tablet (5 mg total) by mouth daily as needed for erectile dysfunction. 08/30/19  Yes McGowan, Larene Beach A, PA-C    Allergies: No Known Allergies  Review of Systems: Review of Systems  Constitutional: Negative for chills and fever.  Respiratory: Negative for shortness of breath.   Cardiovascular: Negative for chest pain.  Gastrointestinal: Positive for abdominal pain (mild discomfort low pelvis). Negative for constipation, diarrhea, nausea and vomiting.  Genitourinary: Positive for dysuria.  Skin: Negative for rash.    Physical Exam BP 121/81   Pulse 96   Temp 97.7 F (36.5 C) (Temporal)   Resp 12   Ht 5\' 11"  (1.803 m)   Wt 209 lb (94.8  kg)   SpO2 95%   BMI 29.15 kg/m  CONSTITUTIONAL: No acute distress HEENT:  Normocephalic, atraumatic, extraocular motion intact. RESPIRATORY:  Lungs are clear, and breath sounds are equal bilaterally. Normal respiratory effort without pathologic use of accessory muscles. CARDIOVASCULAR: Heart is regular without murmurs, gallops, or rubs. GI: The abdomen is soft, non-distended, with only mild discomfort in the low pelvis.  No peritonitis. NEUROLOGIC:  Motor and sensation is grossly normal.  Cranial nerves are grossly intact. PSYCH:  Alert and oriented to person, place and time. Affect is normal.  Labs/Imaging: Labs  12/25/19: WBC 5.3, Hgb 15.5, Hct 48.4, Plt 179.  Na 141, K 4.2, Cl 110, CO2 23, BUN 16, Cr 1.1.  LFTs within normal.  MRI pelvis 11/03/19: IMPRESSION: 1. There is a contrast enhancing fistula tract between the inferior aspect of the mid sigmoid and the left aspect of the bladder dome measuring approximately 2 cm in length (series 20, image 39, series 25, image 47). Findings are in keeping with those seen on prior CT dated 09/06/2019. 2. Severe sigmoid diverticulosis without evidence of acute diverticulitis at this time. 3. Mild bladder wall thickening, which may be due to chronic outlet obstruction and/or cystitis in the setting of colonic fistula. 4. Mild prostatomegaly.  Colonoscopy 11/05/19: IMPRESSION: - One 5 mm polyp in the ascending colon, removed with a cold snare. Resected and retrieved. - Diverticulosis in the entire examined colon. - The entire examined colon is normal on direct and retroflexion views.  Cystoscopy 09/13/19: FINDINGS: - No urethral strictures/lesions are present. - Moderate lateral lobe enlargement prostate  - Mild elevation bladder neck - Bilateral ureteral orifices identified - Bladder mucosa  reveals erythema, bullous edema posterior wall.  Particulate material in bladder. - No bladder stones - No trabeculation - Retroflexion shows no intravesical median lobe   Assessment and Plan: This is a 69 y.o. male with colovesical fistula.  --Discussed with the patient that the main things pending are clearance from his PCP and Cardiologist.  We have sent forms to them.  He has an appointment with Dr. Danise Mina on 3/16, so hopefully we'll know more after that. --Tentatively, we will schedule him for open sigmoidectomy and colovesical fistula takedown, for 02/11/20.  Discussed with him the procedure at length, including risks of bleeding, infection, injury to surrounding structures, the possibility of an ostomy, and he's willing to proceed.  Bowel prep  instructions were given.  He understands he would need COVID testing prior to surgery. --All questions answered.  Face-to-face time spent with the patient and care providers was 25 minutes, with more than 50% of the time spent counseling, educating, and coordinating care of the patient.     Melvyn Neth, [facility]

## 2020-01-17 NOTE — Telephone Encounter (Signed)
Cardiac Clearance faxed to Dr.Gutierrez- stress test was done 01/14/20 Surgery scheduled 02/11/20.

## 2020-01-17 NOTE — Progress Notes (Signed)
Request for Medical Clearance sent to Dr Reino Bellis at [facility]: [phone] Fax: [phone]

## 2020-01-17 NOTE — Progress Notes (Signed)
01/17/2020  History of Present Illness: Russell [name] is a 69 y.o. male presenting for H&P update.  Has a history of colovesical fistula and has been worked up by both Urology and GI.  He had a cystoscopy on 09/13/19 which diagnosed the fistula.  He was seen by GI and had a pelvic MRI on 11/03/19 showing the fistula between sigmoid colon and bladder dome, without any abscess.  He had a colonoscopy on 11/05/19 which did not show any evidence of malignancy.  Unfortunately, due to COVID restrictions on surgical cases in the hospital, his surgery had to be deferred.  In the meantime, he has been trying to get medical and cardiology clearance.  He is followed up by his PCP, Dr. Reino Bellis, and his Cardiologist, Dr. Kenna Gilbert.  He had a stress test this week and is awaiting the results.  He has a follow up with Dr. Danise Mina on 02/04/20.  Since his last visit with [place], he reports that he's been doing well.  He denies any worsening pain, fevers, or chills.  He reports he still has pneumaturia or fecaluria, and has the same mild discomfort in the low pelvis.    Past Medical History: Past Medical History:  Diagnosis Date  . Arthritis   . Back pain   . 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 Hyperlipidemia   . Hypertension      Past Surgical History: Past Surgical History:  Procedure Laterality Date  . BACK SURGERY    . COLONOSCOPY WITH PROPOFOL N/A 11/05/2019   Procedure: COLONOSCOPY WITH PROPOFOL;  Surgeon: [name], MD;  Location: [facility] ENDOSCOPY;  Service: Gastroenterology;  Laterality: N/A;  . SPINE SURGERY    . TRACHEAL SURGERY      Home Medications: Prior to Admission medications   Medication Sig Start Date End Date Taking? Authorizing Provider  atorvastatin (LIPITOR) 20 MG tablet Take 20 mg by mouth daily. 08/20/19  Yes [provider]  bisacodyl (DULCOLAX) 5 MG EC tablet Take all 4 tablets at 8 am the morning prior to your surgery. 11/12/19  Yes Lillianne Eick, Jacqulyn Bath, MD  doxycycline  (VIBRA-TABS) 100 MG tablet Take 100 mg by mouth 2 (two) times daily.   Yes [provider]  erythromycin base (E-MYCIN) 500 MG tablet Take 2 tablets at 8am, 2 tablets at 2pm, and 2 tablets at 8pm the day prior to surgery. 11/12/19  Yes Nishant Schrecengost, Jacqulyn Bath, MD  neomycin (MYCIFRADIN) 500 MG tablet Take 2 tablet at 8am, take 2 tablets at 2pm, and take 2 tablets at 8pm the day prior to your surgery 11/12/19  Yes Zarion Oliff, MD  polyethylene glycol powder (MIRALAX) 17 GM/SCOOP powder Mix full container in 64 ounces of Gatorade or other clear liquid. NO Red Liquids. 11/12/19  Yes Taylar Hartsough, Jacqulyn Bath, MD  tadalafil (CIALIS) 5 MG tablet Take 1 tablet (5 mg total) by mouth daily as needed for erectile dysfunction. 08/30/19  Yes McGowan, Larene Beach A, PA-C    Allergies: No Known Allergies  Review of Systems: Review of Systems  Constitutional: Negative for chills and fever.  Respiratory: Negative for shortness of breath.   Cardiovascular: Negative for chest pain.  Gastrointestinal: Positive for abdominal pain (mild discomfort low pelvis). Negative for constipation, diarrhea, nausea and vomiting.  Genitourinary: Positive for dysuria.  Skin: Negative for rash.    Physical Exam BP 121/81   Pulse 96   Temp 97.7 F (36.5 C) (Temporal)   Resp 12   Ht 5\' 11"  (1.803 m)   Wt 209 lb (94.8  kg)   SpO2 95%   BMI 29.15 kg/m  CONSTITUTIONAL: No acute distress HEENT:  Normocephalic, atraumatic, extraocular motion intact. RESPIRATORY:  Lungs are clear, and breath sounds are equal bilaterally. Normal respiratory effort without pathologic use of accessory muscles. CARDIOVASCULAR: Heart is regular without murmurs, gallops, or rubs. GI: The abdomen is soft, non-distended, with only mild discomfort in the low pelvis.  No peritonitis. NEUROLOGIC:  Motor and sensation is grossly normal.  Cranial nerves are grossly intact. PSYCH:  Alert and oriented to person, place and time. Affect is normal.  Labs/Imaging: Labs  12/25/19: WBC 5.3, Hgb 15.5, Hct 48.4, Plt 179.  Na 141, K 4.2, Cl 110, CO2 23, BUN 16, Cr 1.1.  LFTs within normal.  MRI pelvis 11/03/19: IMPRESSION: 1. There is a contrast enhancing fistula tract between the inferior aspect of the mid sigmoid and the left aspect of the bladder dome measuring approximately 2 cm in length (series 20, image 39, series 25, image 47). Findings are in keeping with those seen on prior CT dated 09/06/2019. 2. Severe sigmoid diverticulosis without evidence of acute diverticulitis at this time. 3. Mild bladder wall thickening, which may be due to chronic outlet obstruction and/or cystitis in the setting of colonic fistula. 4. Mild prostatomegaly.  Colonoscopy 11/05/19: IMPRESSION: - One 5 mm polyp in the ascending colon, removed with a cold snare. Resected and retrieved. - Diverticulosis in the entire examined colon. - The entire examined colon is normal on direct and retroflexion views.  Cystoscopy 09/13/19: FINDINGS: - No urethral strictures/lesions are present. - Moderate lateral lobe enlargement prostate  - Mild elevation bladder neck - Bilateral ureteral orifices identified - Bladder mucosa  reveals erythema, bullous edema posterior wall.  Particulate material in bladder. - No bladder stones - No trabeculation - Retroflexion shows no intravesical median lobe   Assessment and Plan: This is a 69 y.o. male with colovesical fistula.  --Discussed with the patient that the main things pending are clearance from his PCP and Cardiologist.  We have sent forms to them.  He has an appointment with Dr. Danise Mina on 3/16, so hopefully we'll know more after that. --Tentatively, we will schedule him for open sigmoidectomy and colovesical fistula takedown, for 02/11/20.  Discussed with him the procedure at length, including risks of bleeding, infection, injury to surrounding structures, the possibility of an ostomy, and he's willing to proceed.  Bowel prep  instructions were given.  He understands he would need COVID testing prior to surgery. --All questions answered.  Face-to-face time spent with the patient and care providers was 25 minutes, with more than 50% of the time spent counseling, educating, and coordinating care of the patient.     Melvyn Neth, [facility]

## 2020-01-21 ENCOUNTER — Telehealth: Payer: Self-pay | Admitting: Surgery

## 2020-01-21 NOTE — Telephone Encounter (Signed)
Outbound spanish speaking call made by Cleta Alberts, CMA advising the pt & his daughter, Dawson Bills, of the following updates to pre admission date/time, COVID Testing date and Surgery date.  Surgery Date: 02/11/20 Preadmission Testing Date: 02/04/20 (8a-1p) Covid Testing Date: 02/07/20 - patient advised to go to the [facility] ([place])  Patient has been made aware to call [phone], between 1-3:00pm the day before surgery, to find out what time to arrive.

## 2020-01-22 NOTE — Progress Notes (Signed)
Cardiac Clearance received from Dr Kenna Gilbert at [facility]. The patient is cleared at low risk.

## 2020-02-04 ENCOUNTER — Inpatient Hospital Stay
Admission: RE | Admit: 2020-02-04 | Discharge: 2020-02-04 | Disposition: A | Discharge status: Home / Self Care | Payer: Medicare Other | Source: Ambulatory Visit | Attending: Surgery | Admitting: Surgery

## 2020-02-05 ENCOUNTER — Other Ambulatory Visit: Payer: Self-pay

## 2020-02-05 ENCOUNTER — Other Ambulatory Visit
Admission: RE | Admit: 2020-02-05 | Discharge: 2020-02-05 | Disposition: A | Discharge status: Home / Self Care | Payer: Medicare Other | Source: Ambulatory Visit | Attending: Surgery | Admitting: Surgery

## 2020-02-05 DIAGNOSIS — Z01818 Encounter for other preprocedural examination: Secondary | ICD-10-CM | POA: Insufficient documentation

## 2020-02-05 NOTE — Patient Instructions (Signed)
Your procedure is scheduled on: Tuesday February 11, 2020 Su procedimiento est programado para: Martes 23 de Marzo 2021 Report to Day Surgery. Presntese a: Russell [name]  To find out your arrival time please call [phone] between 1PM - 3PM on Monday February 10, 2020. Para saber su hora de llegada por favor llame al [phone] Russell [name] la 1PM - 3PM el da: Lunes 22 de Marzo 2021  Remember: Instructions that are not followed completely may result in serious medical risk, up to and including death,  or upon the discretion of your surgeon and anesthesiologist your surgery may need to be rescheduled.  Recuerde: Las instrucciones que no se siguen completamente Heritage manager en un riesgo de salud grave, incluyendo hasta  la [place] o a discrecin de su cirujano y Environmental health practitioner, su ciruga se puede posponer.   __X_ 1.Do not eat food after midnight the night before your procedure. No    gum chewing or hard candies. You may drink clear liquids up to 2 hours     before you are scheduled to arrive for your surgery- DO not drink clear     Liquids within 2 hours of the start of your surgery.     Clear Liquids include:    water, apple juice without pulp, clear carbohydrate drink such as    Clearfast of [place]art[place]ade, Black Coffee or Tea (Do not add anything to coffee or tea).      No coma nada despus de la medianoche de la noche anterior a su    procedimiento. No coma chicles ni caramelos duros. Puede tomar    lquidos claros hasta 2 horas antes de su hora programada de llegada al     hospital para su procedimiento. No tome lquidos claros durante el     transcurso de las 2 horas de su llegada programada al hospital para su     procedimiento, ya que esto puede llevar a que su procedimiento se    retrase o tenga que volver a Health and safety inspector.  Los lquidos claros incluyen:          - Agua o jugo de [place] sin pulpa          - Bebidas claras con carbohidratos como ClearFast o Gatorade          -  Caf negro o t claro (sin leche, sin cremas, no agregue nada al caf ni al t)  No tome nada que no est en esta lista.  Los pacientes con diabetes tipo 1 y tipo 2 solo deben Agricultural engineer.  Llame a la clnica de [facility] o a la unidad de Same Day Surgery si  tiene alguna pregunta sobre estas instrucciones.              _X__ 2.Do Not Smoke or use e-cigarettes For 24 Hours Prior to Your Surgery.    Do not use any chewable tobacco products for at least 6   hours prior to surgery.    No fume ni use cigarrillos electrnicos durante las 24 horas previas    a su [place].  No use ningn producto de tabaco masticable durante   al menos 6 horas antes de la [place].     __X_ 3. No alcohol for 24 hours before or after surgery.    No tome alcohol durante las 24 horas antes ni despus de la [place].    __x__ 5. Notify your doctor if there is any change in your medical condition (cold,fever, infections).    Informe a  su mdico si hay algn cambio en su condicin mdica  (resfriado, fiebre, infecciones).   Do not wear jewelry, make-up, hairpins, clips or nail polish.  No use joyas, maquillajes, pinzas/ganchos para el cabello ni esmalte de uas.  Do not wear lotions, powders, or perfumes. You may wear deodorant.  No use lociones, polvos o perfumes.  Puede usar desodorante.    Do not shave 48 hours prior to surgery. Men may shave face and neck.  No se afeite 48 horas antes de la [place].  Los hombres pueden [employer] cara  y el cuello.   Do not bring valuables to the hospital.   No lleve objetos [facility] is not responsible for any belongings or valuables.  [place] no se hace responsable de ningn tipo de pertenencias u objetos de Geographical information systems officer.               Contacts, dentures or bridgework may not be worn into surgery.  Los lentes de [place], las dentaduras postizas o puentes no se pueden usar en la [place].   Leave your suitcase in the car. After surgery it may be brought  to your room.  Deje su maleta en el auto.  Despus de la ciruga podr traerla a su habitacin.   For patients admitted to the hospital, discharge time is determined by your  treatment team.  Para los pacientes que sean ingresados al hospital, el tiempo en el cual se le  dar de alta es determinado por su equipo de [place].   Patients discharged the day of surgery will not be allowed to drive home. A los pacientes que se les da de alta el mismo da de la ciruga no se les permitir conducir a Holiday representative.   Please read over the following fact sheets that you were given: Por favor [place] informacin que le dieron:    ____ Take these medicines the morning of surgery with A SIP OF WATER:          [employer] medicinas la maana de la ciruga con UN SORBO DE AGUA:  1. ninguna   __x__ Use CHG Soap as directed          Utilice el jabn de CHG segn lo indicado  __x__ Stop Anti-inflammatories such as aspirin, Aleve, naproxen, ibuprofen and or BC powders.          Deje de tomar antiinflamatorios como aspirinas, Aleve, naproxen, ibuprofen o polvos de BC powders.   __x__ Stop supplements until after surgery            Deje de tomar suplementos hasta despus de la ciruga  __x__ Do not start any herbal supplements before your surgery.          No empiece a tomar suplementos con hierbas antes de su cirugia.   Medicamentos para [place] antes de su cirugia (siga las instrucciones de su cirujano.)  bisacodyl (DULCOLAX) 5 MG EC tablet Take all 4 tablets at 8 am the morning prior to your surgery. erythromycin base (E-MYCIN) 500 MG tablet Take 2 tablets at 8am, 2 tablets at 2pm, and 2 tablets at 8pm the day prior to surgery. neomycin (MYCIFRADIN) 500 MG tablet Take 2 tablet at 8am, take 2 tablets at 2pm, and take 2 tablets at 8pm the day prior to your surgery      (polyethylene glycol powder (MIRALAX) 17 GM/SCOOP powder Mix full container in 64 ounces of Gatorade or other clear liquid.  NO Red Liquids) este medicamento no tengo instrucciones de cuando se lo va a tomar. Favor de [place] a su doctor. Lo mas pronto posible.

## 2020-02-06 ENCOUNTER — Telehealth: Payer: Self-pay | Admitting: Emergency Medicine

## 2020-02-06 NOTE — Telephone Encounter (Signed)
Medical Clearance received from Dr Marguerita Beards office. Pt is optimized for surgery at medium risk.

## 2020-02-07 ENCOUNTER — Other Ambulatory Visit: Payer: Self-pay

## 2020-02-07 ENCOUNTER — Other Ambulatory Visit
Admission: RE | Admit: 2020-02-07 | Discharge: 2020-02-07 | Disposition: A | Discharge status: Home / Self Care | Payer: Medicare Other | Source: Ambulatory Visit | Attending: Surgery | Admitting: Surgery

## 2020-02-07 DIAGNOSIS — Z20822 Contact with and (suspected) exposure to covid-19: Secondary | ICD-10-CM | POA: Diagnosis not present

## 2020-02-07 DIAGNOSIS — Z01812 Encounter for preprocedural laboratory examination: Secondary | ICD-10-CM | POA: Insufficient documentation

## 2020-02-07 LAB — TYPE AND SCREEN
ABO/RH(D): O POS
Antibody Screen: NEGATIVE

## 2020-02-07 LAB — SARS CORONAVIRUS 2 (TAT 6-24 HRS): SARS Coronavirus 2: NEGATIVE

## 2020-02-07 MED ORDER — ONDANSETRON HCL 4 MG/2ML IJ SOLN
4.0000 mg | Freq: Once | INTRAMUSCULAR | Status: DC | PRN
  Filled 2020-02-07: qty 2

## 2020-02-07 MED ORDER — FENTANYL CITRATE (PF) 100 MCG/2ML IJ SOLN
25.0000 ug | INTRAMUSCULAR | Status: DC | PRN
  Filled 2020-02-07: qty 0.5

## 2020-02-11 ENCOUNTER — Inpatient Hospital Stay: Payer: Medicare Other | Admitting: Anesthesiology

## 2020-02-11 ENCOUNTER — Encounter
Admission: RE | Disposition: A | Discharge status: Home Health | Payer: Self-pay | Source: Home / Self Care | Attending: Surgery

## 2020-02-11 ENCOUNTER — Inpatient Hospital Stay
Admission: RE | Admit: 2020-02-11 | Discharge: 2020-02-14 | DRG: 655 | Disposition: A | Discharge status: Home Health | Payer: Medicare Other | Attending: Surgery | Admitting: Surgery

## 2020-02-11 ENCOUNTER — Encounter: Payer: Self-pay | Admitting: Surgery

## 2020-02-11 ENCOUNTER — Other Ambulatory Visit: Payer: Self-pay

## 2020-02-11 DIAGNOSIS — M1990 Unspecified osteoarthritis, unspecified site: Secondary | ICD-10-CM | POA: Diagnosis present

## 2020-02-11 DIAGNOSIS — Z79899 Other long term (current) drug therapy: Secondary | ICD-10-CM | POA: Diagnosis not present

## 2020-02-11 DIAGNOSIS — I10 Essential (primary) hypertension: Secondary | ICD-10-CM | POA: Diagnosis present

## 2020-02-11 DIAGNOSIS — E785 Hyperlipidemia, unspecified: Secondary | ICD-10-CM | POA: Diagnosis present

## 2020-02-11 DIAGNOSIS — N321 Vesicointestinal fistula: Secondary | ICD-10-CM | POA: Diagnosis not present

## 2020-02-11 HISTORY — PX: TAKE DOWN OF INTESTINAL FISTULA: SHX6107

## 2020-02-11 HISTORY — PX: PARTIAL COLECTOMY: SHX5273

## 2020-02-11 LAB — MRSA PCR SCREENING: MRSA by PCR: NEGATIVE

## 2020-02-11 SURGERY — COLECTOMY, PARTIAL
Anesthesia: General

## 2020-02-11 MED ORDER — ENOXAPARIN SODIUM 40 MG/0.4ML ~~LOC~~ SOLN
40.0000 mg | SUBCUTANEOUS | Status: DC
  Administered 2020-02-12 – 2020-02-14 (×3): 40 mg via SUBCUTANEOUS
  Filled 2020-02-11 (×3): qty 0.4

## 2020-02-11 MED ORDER — ROCURONIUM BROMIDE 100 MG/10ML IV SOLN
INTRAVENOUS | Status: DC | PRN
  Administered 2020-02-11: 20 mg via INTRAVENOUS
  Administered 2020-02-11: 50 mg via INTRAVENOUS
  Administered 2020-02-11 (×2): 30 mg via INTRAVENOUS

## 2020-02-11 MED ORDER — SODIUM CHLORIDE 0.9 % IV SOLN
INTRAVENOUS | Status: DC | PRN
  Administered 2020-02-11: 70 mL

## 2020-02-11 MED ORDER — PROPOFOL 10 MG/ML IV BOLUS
INTRAVENOUS | Status: AC
  Filled 2020-02-11: qty 20

## 2020-02-11 MED ORDER — BUPIVACAINE-EPINEPHRINE (PF) 0.5% -1:200000 IJ SOLN
INTRAMUSCULAR | Status: DC | PRN
  Administered 2020-02-11: 30 mL

## 2020-02-11 MED ORDER — OXYCODONE HCL 5 MG/5ML PO SOLN: 5.0000 mg | Freq: Once | ORAL | Status: DC | PRN

## 2020-02-11 MED ORDER — OXYCODONE HCL 5 MG PO TABS
5.0000 mg | ORAL_TABLET | ORAL | Status: DC | PRN
  Administered 2020-02-13: 5 mg via ORAL
  Administered 2020-02-13: 10 mg via ORAL
  Filled 2020-02-11: qty 2
  Filled 2020-02-11: qty 1

## 2020-02-11 MED ORDER — VASOPRESSIN 20 UNIT/ML IV SOLN
INTRAVENOUS | Status: DC | PRN
  Administered 2020-02-11: 2 [IU] via INTRAVENOUS

## 2020-02-11 MED ORDER — FAMOTIDINE 20 MG PO TABS
ORAL_TABLET | ORAL | Status: AC
  Administered 2020-02-11: 08:00:00 20 mg via ORAL
  Filled 2020-02-11: qty 1

## 2020-02-11 MED ORDER — PROPOFOL 500 MG/50ML IV EMUL
INTRAVENOUS | Status: DC | PRN
  Administered 2020-02-11: 100 ug/kg/min via INTRAVENOUS

## 2020-02-11 MED ORDER — PROPOFOL 500 MG/50ML IV EMUL
INTRAVENOUS | Status: AC
  Filled 2020-02-11: qty 50

## 2020-02-11 MED ORDER — ONDANSETRON 4 MG PO TBDP: 4.0000 mg | ORAL_TABLET | Freq: Four times a day (QID) | ORAL | Status: DC | PRN

## 2020-02-11 MED ORDER — FENTANYL CITRATE (PF) 100 MCG/2ML IJ SOLN: 25.0000 ug | INTRAMUSCULAR | Status: DC | PRN

## 2020-02-11 MED ORDER — FENTANYL CITRATE (PF) 100 MCG/2ML IJ SOLN
INTRAMUSCULAR | Status: AC
  Filled 2020-02-11: qty 2

## 2020-02-11 MED ORDER — BUPIVACAINE LIPOSOME 1.3 % IJ SUSP
INTRAMUSCULAR | Status: AC
  Filled 2020-02-11: qty 20

## 2020-02-11 MED ORDER — SUGAMMADEX SODIUM 200 MG/2ML IV SOLN
INTRAVENOUS | Status: DC | PRN
  Administered 2020-02-11: 200 mg via INTRAVENOUS

## 2020-02-11 MED ORDER — METHYLENE BLUE 0.5 % INJ SOLN
INTRAVENOUS | Status: AC
  Filled 2020-02-11: qty 10

## 2020-02-11 MED ORDER — PANTOPRAZOLE SODIUM 40 MG IV SOLR
40.0000 mg | Freq: Every day | INTRAVENOUS | Status: DC
  Administered 2020-02-11 – 2020-02-13 (×3): 40 mg via INTRAVENOUS
  Filled 2020-02-11 (×3): qty 40

## 2020-02-11 MED ORDER — EPHEDRINE SULFATE 50 MG/ML IJ SOLN
INTRAMUSCULAR | Status: DC | PRN
  Administered 2020-02-11: 10 mg via INTRAVENOUS
  Administered 2020-02-11: 5 mg via INTRAVENOUS
  Administered 2020-02-11: 10 mg via INTRAVENOUS

## 2020-02-11 MED ORDER — LACTATED RINGERS IV SOLN: INTRAVENOUS | Status: DC

## 2020-02-11 MED ORDER — FAMOTIDINE 20 MG PO TABS: 20.0000 mg | ORAL_TABLET | Freq: Once | ORAL | Status: AC

## 2020-02-11 MED ORDER — SODIUM CHLORIDE 0.9 % IV SOLN
2.0000 g | INTRAVENOUS | Status: AC
  Administered 2020-02-11 (×2): 2 g via INTRAVENOUS
  Filled 2020-02-11: qty 2

## 2020-02-11 MED ORDER — ATORVASTATIN CALCIUM 20 MG PO TABS
20.0000 mg | ORAL_TABLET | Freq: Every day | ORAL | Status: DC
  Administered 2020-02-11 – 2020-02-14 (×4): 20 mg via ORAL
  Filled 2020-02-11 (×4): qty 1

## 2020-02-11 MED ORDER — ACETAMINOPHEN 500 MG PO TABS
1000.0000 mg | ORAL_TABLET | Freq: Four times a day (QID) | ORAL | Status: DC
  Administered 2020-02-11 – 2020-02-14 (×10): 1000 mg via ORAL
  Filled 2020-02-11 (×11): qty 2

## 2020-02-11 MED ORDER — ONDANSETRON HCL 4 MG/2ML IJ SOLN
INTRAMUSCULAR | Status: DC | PRN
  Administered 2020-02-11: 4 mg via INTRAVENOUS

## 2020-02-11 MED ORDER — KETAMINE HCL 50 MG/ML IJ SOLN
INTRAMUSCULAR | Status: AC
  Filled 2020-02-11: qty 10

## 2020-02-11 MED ORDER — ONDANSETRON HCL 4 MG/2ML IJ SOLN: 4.0000 mg | Freq: Once | INTRAMUSCULAR | Status: DC | PRN

## 2020-02-11 MED ORDER — EPINEPHRINE PF 1 MG/ML IJ SOLN
INTRAMUSCULAR | Status: AC
  Filled 2020-02-11: qty 1

## 2020-02-11 MED ORDER — LIDOCAINE HCL (CARDIAC) PF 100 MG/5ML IV SOSY
PREFILLED_SYRINGE | INTRAVENOUS | Status: DC | PRN
  Administered 2020-02-11: 60 mg via INTRAVENOUS

## 2020-02-11 MED ORDER — PROPOFOL 10 MG/ML IV BOLUS
INTRAVENOUS | Status: DC | PRN
  Administered 2020-02-11: 120 mg via INTRAVENOUS

## 2020-02-11 MED ORDER — ALVIMOPAN 12 MG PO CAPS
ORAL_CAPSULE | ORAL | Status: AC
  Administered 2020-02-11: 12 mg via ORAL
  Filled 2020-02-11: qty 1

## 2020-02-11 MED ORDER — MIDAZOLAM HCL 2 MG/2ML IJ SOLN
INTRAMUSCULAR | Status: AC
  Filled 2020-02-11: qty 2

## 2020-02-11 MED ORDER — SODIUM CHLORIDE (PF) 0.9 % IJ SOLN
INTRAMUSCULAR | Status: AC
  Filled 2020-02-11: qty 50

## 2020-02-11 MED ORDER — ACETAMINOPHEN 500 MG PO TABS: 1000.0000 mg | ORAL_TABLET | ORAL | Status: AC

## 2020-02-11 MED ORDER — HYDROMORPHONE HCL 1 MG/ML IJ SOLN: 0.5000 mg | INTRAMUSCULAR | Status: DC | PRN

## 2020-02-11 MED ORDER — BUPIVACAINE LIPOSOME 1.3 % IJ SUSP: 20.0000 mL | Freq: Once | INTRAMUSCULAR | Status: DC

## 2020-02-11 MED ORDER — PHENYLEPHRINE HCL (PRESSORS) 10 MG/ML IV SOLN
INTRAVENOUS | Status: DC | PRN
  Administered 2020-02-11: 100 ug via INTRAVENOUS

## 2020-02-11 MED ORDER — PROPOFOL 10 MG/ML IV BOLUS
INTRAVENOUS | Status: AC
  Filled 2020-02-11: qty 40

## 2020-02-11 MED ORDER — ALVIMOPAN 12 MG PO CAPS: 12.0000 mg | ORAL_CAPSULE | ORAL | Status: AC

## 2020-02-11 MED ORDER — DEXAMETHASONE SODIUM PHOSPHATE 10 MG/ML IJ SOLN
INTRAMUSCULAR | Status: DC | PRN
  Administered 2020-02-11: 10 mg via INTRAVENOUS

## 2020-02-11 MED ORDER — PHENYLEPHRINE HCL (PRESSORS) 10 MG/ML IV SOLN
INTRAVENOUS | Status: AC
  Filled 2020-02-11: qty 1

## 2020-02-11 MED ORDER — OXYCODONE HCL 5 MG PO TABS: 5.0000 mg | ORAL_TABLET | Freq: Once | ORAL | Status: DC | PRN

## 2020-02-11 MED ORDER — ONDANSETRON HCL 4 MG/2ML IJ SOLN: 4.0000 mg | Freq: Four times a day (QID) | INTRAMUSCULAR | Status: DC | PRN

## 2020-02-11 MED ORDER — CHLORHEXIDINE GLUCONATE CLOTH 2 % EX PADS
6.0000 | MEDICATED_PAD | Freq: Every day | CUTANEOUS | Status: DC
  Administered 2020-02-12 – 2020-02-13 (×2): 6 via TOPICAL

## 2020-02-11 MED ORDER — GABAPENTIN 300 MG PO CAPS: 300.0000 mg | ORAL_CAPSULE | ORAL | Status: AC

## 2020-02-11 MED ORDER — SUGAMMADEX SODIUM 200 MG/2ML IV SOLN: INTRAVENOUS | Status: DC | PRN

## 2020-02-11 MED ORDER — SODIUM CHLORIDE 0.9 % IV SOLN
2.0000 g | Freq: Three times a day (TID) | INTRAVENOUS | Status: AC
  Administered 2020-02-11 – 2020-02-12 (×3): 2 g via INTRAVENOUS
  Filled 2020-02-11 (×3): qty 2

## 2020-02-11 MED ORDER — CHLORHEXIDINE GLUCONATE CLOTH 2 % EX PADS: 6.0000 | MEDICATED_PAD | Freq: Once | CUTANEOUS | Status: DC

## 2020-02-11 MED ORDER — MIDAZOLAM HCL 2 MG/2ML IJ SOLN
INTRAMUSCULAR | Status: DC | PRN
  Administered 2020-02-11: .5 mg via INTRAVENOUS

## 2020-02-11 MED ORDER — BUPIVACAINE HCL (PF) 0.5 % IJ SOLN
INTRAMUSCULAR | Status: AC
  Filled 2020-02-11: qty 30

## 2020-02-11 MED ORDER — GABAPENTIN 300 MG PO CAPS
ORAL_CAPSULE | ORAL | Status: AC
  Administered 2020-02-11: 300 mg via ORAL
  Filled 2020-02-11: qty 1

## 2020-02-11 MED ORDER — SODIUM CHLORIDE 0.9 % IV SOLN
INTRAVENOUS | Status: DC | PRN
  Administered 2020-02-11: 50 ug/min via INTRAVENOUS

## 2020-02-11 MED ORDER — LIDOCAINE HCL (PF) 2 % IJ SOLN
INTRAMUSCULAR | Status: AC
  Filled 2020-02-11: qty 5

## 2020-02-11 MED ORDER — POLYETHYLENE GLYCOL 3350 17 G PO PACK: 17.0000 g | PACK | Freq: Every day | ORAL | Status: DC | PRN

## 2020-02-11 MED ORDER — ACETAMINOPHEN 500 MG PO TABS
ORAL_TABLET | ORAL | Status: AC
  Administered 2020-02-11: 1000 mg via ORAL
  Filled 2020-02-11: qty 2

## 2020-02-11 MED ORDER — KETOROLAC TROMETHAMINE 15 MG/ML IJ SOLN
15.0000 mg | Freq: Four times a day (QID) | INTRAMUSCULAR | Status: DC
  Administered 2020-02-11 – 2020-02-14 (×11): 15 mg via INTRAVENOUS
  Filled 2020-02-11 (×11): qty 1

## 2020-02-11 MED ORDER — ROCURONIUM BROMIDE 10 MG/ML (PF) SYRINGE
PREFILLED_SYRINGE | INTRAVENOUS | Status: AC
  Filled 2020-02-11: qty 10

## 2020-02-11 MED ORDER — BUPIVACAINE-EPINEPHRINE (PF) 0.25% -1:200000 IJ SOLN
INTRAMUSCULAR | Status: AC
  Filled 2020-02-11: qty 30

## 2020-02-11 MED ORDER — KETAMINE HCL 10 MG/ML IJ SOLN
INTRAMUSCULAR | Status: DC | PRN
  Administered 2020-02-11 (×2): 25 mg via INTRAVENOUS

## 2020-02-11 MED ORDER — FENTANYL CITRATE (PF) 100 MCG/2ML IJ SOLN
INTRAMUSCULAR | Status: DC | PRN
  Administered 2020-02-11: 25 ug via INTRAVENOUS
  Administered 2020-02-11: 50 ug via INTRAVENOUS
  Administered 2020-02-11: 25 ug via INTRAVENOUS

## 2020-02-11 MED ORDER — ONDANSETRON HCL 4 MG/2ML IJ SOLN: INTRAMUSCULAR | Status: DC | PRN

## 2020-02-11 MED ORDER — SODIUM CHLORIDE 0.9 % IV SOLN
2.0000 g | Freq: Once | INTRAVENOUS | Status: DC
  Filled 2020-02-11: qty 2

## 2020-02-11 SURGICAL SUPPLY — 42 items
CANISTER SUCT 1200ML W/VALVE (MISCELLANEOUS) ×3 IMPLANT
CHLORAPREP W/TINT 26 (MISCELLANEOUS) ×7 IMPLANT
COVER WAND RF STERILE (DRAPES) ×3 IMPLANT
DRAPE LAPAROTOMY 100X77 ABD (DRAPES) ×3 IMPLANT
DRAPE LEGGINS SURG 28X43 STRL (DRAPES) ×2 IMPLANT
DRAPE UNDER BUTTOCK W/FLU (DRAPES) ×3 IMPLANT
ELECT CAUTERY BLADE 6.4 (BLADE) ×3 IMPLANT
ELECT REM PT RETURN 9FT ADLT (ELECTROSURGICAL) ×3
ELECTRODE REM PT RTRN 9FT ADLT (ELECTROSURGICAL) ×1 IMPLANT
GAUZE SPONGE 4X4 12PLY STRL (GAUZE/BANDAGES/DRESSINGS) ×1 IMPLANT
GLOVE SURG SYN 7.0 (GLOVE) ×9 IMPLANT
GLOVE SURG SYN 7.0 PF PI (GLOVE) ×2 IMPLANT
GLOVE SURG SYN 7.5  E (GLOVE) ×6
GLOVE SURG SYN 7.5 E (GLOVE) ×3 IMPLANT
GLOVE SURG SYN 7.5 PF PI (GLOVE) ×2 IMPLANT
GOWN STRL REUS W/ TWL LRG LVL3 (GOWN DISPOSABLE) ×4 IMPLANT
GOWN STRL REUS W/TWL LRG LVL3 (GOWN DISPOSABLE) ×10
KIT TURNOVER KIT A (KITS) ×3 IMPLANT
LABEL OR SOLS (LABEL) ×3 IMPLANT
LIGASURE IMPACT 36 18CM CVD LR (INSTRUMENTS) ×2 IMPLANT
NEEDLE HYPO 22GX1.5 SAFETY (NEEDLE) ×3 IMPLANT
NS IRRIG 1000ML POUR BTL (IV SOLUTION) ×3 IMPLANT
PACK BASIN MAJOR ARMC (MISCELLANEOUS) ×3 IMPLANT
PACK COLON CLEAN CLOSURE (MISCELLANEOUS) ×5 IMPLANT
SEPRAFILM MEMBRANE 5X6 (MISCELLANEOUS) IMPLANT
SPONGE LAP 18X18 RF (DISPOSABLE) ×9 IMPLANT
STAPLER CIRCULAR 29MM (STAPLE) ×2 IMPLANT
STAPLER CUT CVD 40MM BLUE (STAPLE) ×2 IMPLANT
STAPLER PROXIMATE 75MM BLUE (STAPLE) ×2 IMPLANT
STAPLER RELOADABLE 65 2-0 SUT (MISCELLANEOUS) IMPLANT
STAPLER SKIN PROX 35W (STAPLE) ×3 IMPLANT
STAPLER SYS INTERNAL RELOAD SS (MISCELLANEOUS) IMPLANT
SUT MNCRL 3-0 UNDYED SH (SUTURE) ×1 IMPLANT
SUT MONOCRYL 3-0 UNDYED (SUTURE)
SUT PDS AB 1 TP1 96 (SUTURE) ×4 IMPLANT
SUT PROLENE 2 0 SH DA (SUTURE) ×2 IMPLANT
SUT SILK 2-0 (SUTURE) ×1 IMPLANT
SUT SILK 3-0 (SUTURE) ×3 IMPLANT
SUT VIC AB 1 CTX 27 (SUTURE) ×3 IMPLANT
SYR 10ML LL (SYRINGE) ×3 IMPLANT
SYR BULB IRRIG 60ML STRL (SYRINGE) ×2 IMPLANT
TRAY FOLEY MTR SLVR 16FR STAT (SET/KITS/TRAYS/PACK) ×3 IMPLANT

## 2020-02-11 NOTE — Op Note (Signed)
Procedure Date:  02/11/2020  Pre-operative Diagnosis:  Colovesical fistula  Post-operative Diagnosis:  Colovesical fistula  Procedure:  Open sigmoidectomy, takedown of colovesical fistula, bladder repair, splenic flexure mobilization, rigid sigmoidoscopy  Surgeon:  Melvyn Neth, MD  Assistants:  Ronny Bacon, MD -- his assistance was needed due to the complexity of the surgery, for appropriate exposure, assistance with colorectal anastomosis.  Also assisting, Burke Keels, PA-S  Anesthesia:  General endotracheal  Estimated Blood Loss:  300 ml  Specimens:  Sigmoid colon, anastomotic donuts  Complications:  None  Indications for Procedure:  This is a 69 y.o. male who presents with diagnosis of colovesical fistula.  He had prior workup with GI and Urology which ruled out malignancy.  The risks of bleeding, abscess or infection, injury to surrounding structures, and need for further procedures were all discussed with the patient and was willing to proceed.  Description of Procedure: The patient was correctly identified in the preoperative area and brought into the operating room.  The patient was placed supine with VTE prophylaxis in place.  Appropriate time-outs were performed.  Anesthesia was induced and the patient was intubated.  Foley catheter was placed.  Appropriate antibiotics were infused.  The patient's legs were placed in very low lithotomy position.  His left hip could not flex or extend well.  The abdomen was prepped and draped in a sterile fashion.  A midline incision was made and electrocautery was used to dissect down the subcutaneous tissue to the fascia.  The fascia was incised and extended superiorly and inferiorly.  The lower abdomen was explored and an area of scarring between sigmoid colon and bladder was identified.  We started mobilizing the sigmoid colon by incising along the white line of toldt.  We had difficulty getting in the right plane and he had oozing  during mobilization.  We proceeded distally towards the area of scarring, and careful dissection both bluntly and with cautery was performed to isolate the fistula.  This was cut using cautery.  The bladder wound was repaired using two layers of 2-0 Vicryl suture.  The bladder was filled with about 180-200 ml of saline using previously placed Foley catheter and there was no leak.  The colon was mobilized more proximally, taking down the splenic flexure.  More distally, we were able to identify the left gonadal vessels and left iliac artery, but could not identify the left ureter.  We continued mobilizing distally and found a point for resection in the proximal rectum.  A window was created in the mesentery and a contour stapler was used to transect distally at the rectum.  We then used LigaSure to take the mesentery very close to the colon in order to avoid the left ureter.  We found a proximal point for our transection, and this was transected using blue load GIA stapler.  The specimen was removed and sent to pathology.  We then opened the proximal staple line and used sizing dilators and opened the 29 EEA stapler.  The anvil was placed and a purse string was created using 2-0 Prolene and the anvil was secured.  There was a diverticulum that we noted was not part of the purse string, and this was secured with an additional 2-0 Prolene to the anvil.  Distally, the anal canal and rectum were also dilated, and the 29 EEA stapler was introduced and the anastomosis was created.  We then proceeded to do a rigid sigmoidoscopy and an insufflation test.  There were no  bubbles noted.  The abdomen was thoroughly irrigated.  Cautery was used to achieve hemostasis.  The omentum was brought down and placed between the anastomosis and the bladder repair.  We then proceeded with clean closure.  100 ml of Exparel solution combined with 0.5% bupivacaine with epi and saline were infiltrated onto the peritoneum, fascia, and  subcutaneous tissue.  The fascia was closed using #1 PDS sutures.  The midline wound was irrigated and closed in layers using 3-0 Vicryl for dermal layer and staples for skin.  The wound was dressed with Honeycomb dressing.  The patient was emerged from anesthesia and extubated and brought to the recovery room for further management.  The patient tolerated the procedure well and all counts were correct at the end of the case.   Melvyn Neth, MD

## 2020-02-11 NOTE — Brief Op Note (Signed)
02/11/2020  3:12 PM  PATIENT:  Russell [name]  69 y.o. male  PRE-OPERATIVE DIAGNOSIS:  Colovesical fistula  POST-OPERATIVE DIAGNOSIS:  Colovesical fistula  PROCEDURE:  Procedure(s): PARTIAL COLECTOMY -- sigmoid (N/A) TAKE DOWN OF INTESTINAL FISTULA -- colovesical fistula (N/A)  SURGEON:  Surgeon(s) and Role:    * Brysan Mcevoy, MD - Primary    * Ronny Bacon, MD - Assisting  ASSISTANTS: Burke Keels, PA-S   ANESTHESIA:   general  EBL:  300 mL   BLOOD ADMINISTERED:none  DRAINS: none   LOCAL MEDICATIONS USED:  BUPIVICAINE   SPECIMEN:  Source of Specimen:  sigmoid colon  DISPOSITION OF SPECIMEN:  PATHOLOGY  COUNTS:  YES   DICTATION: .Dragon Dictation  PLAN OF CARE: Admit to inpatient   PATIENT DISPOSITION:  PACU - hemodynamically stable.   Delay start of Pharmacological VTE agent (>24hrs) due to surgical blood loss or risk of bleeding: yes

## 2020-02-11 NOTE — Anesthesia Preprocedure Evaluation (Addendum)
Anesthesia Evaluation  Patient identified by [name], date of birth, ID band Patient awake    Reviewed: Allergy & Precautions, NPO status , Patient's Chart, lab work & pertinent test results  History of Anesthesia Complications Negative for: history of anesthetic complications  Airway Mallampati: III  TM Distance: >3 FB Neck ROM: Full    Dental  (+) Teeth Intact, Poor Dentition, Dental Advisory Given   Pulmonary neg pulmonary ROS, neg sleep apnea, neg COPD, Patient abstained from smoking.Not current smoker,    Pulmonary exam normal breath sounds clear to auscultation       Cardiovascular Exercise Tolerance: Good METShypertension, Pt. on medications + Past MI  (-) CAD (-) dysrhythmias  Rhythm:Regular Rate:Normal - Systolic murmurs MI in past 2/2 flu  Stress test 2021 with no evidence of ischemia.  TTE 2017:  NORMAL LEFT VENTRICULAR SYSTOLIC FUNCTION WITH MILD LVH NORMAL LA PRESSURES WITH NORMAL DIASTOLIC FUNCTION NORMAL RIGHT VENTRICULAR SYSTOLIC FUNCTION VALVULAR REGURGITATION: MILD MR, TRIVIAL TR NO VALVULAR STENOSIS TACHYCARDIA THROUGHOUT STUDY   Neuro/Psych negative neurological ROS  negative psych ROS   GI/Hepatic neg GERD  ,(+)     (-) substance abuse  ,   Endo/Other  neg diabetes  Renal/GU negative Renal ROS     Musculoskeletal   Abdominal   Peds  Hematology   Anesthesia Other Findings Past Medical History: No date: Arthritis No date: Back pain 09/06/2019: Colovesical fistula No date: Hyperlipidemia No date: Hypertension  Reproductive/Obstetrics                            Anesthesia Physical Anesthesia Plan  ASA: III  Anesthesia Plan: General   Post-op Pain Management:    Induction: Intravenous  PONV Risk Score and Plan: 4 or greater and Ondansetron, Dexamethasone, Propofol infusion, TIVA and Treatment may vary due to age or medical condition  Airway  Management Planned: Oral ETT  Additional Equipment: None  Intra-op Plan:   Post-operative Plan: Extubation in OR  Informed Consent: I have reviewed the patients History and Physical, chart, labs and discussed the procedure including the risks, benefits and alternatives for the proposed anesthesia with the patient or authorized representative who has indicated his/her understanding and acceptance.     Dental advisory given  Plan Discussed with: CRNA and Surgeon  Anesthesia Plan Comments: (Discussed risks of anesthesia with patient, including PONV, sore throat, lip/dental damage. Rare risks discussed as well, such as cardiorespiratory and neurological sequelae. Patient understands.)        Anesthesia Quick Evaluation

## 2020-02-11 NOTE — Interval H&P Note (Signed)
History and Physical Interval Note:  02/11/2020 8:22 AM  Russell [name]  has presented today for surgery, with the diagnosis of Colovesical fistula.  The various methods of treatment have been discussed with the patient and family. After consideration of risks, benefits and other options for treatment, the patient has consented to  Procedure(s): PARTIAL COLECTOMY -- sigmoid (N/A) TAKE DOWN OF INTESTINAL FISTULA -- colovesical fistula (N/A) as a surgical intervention.  The patient's history has been reviewed, patient examined, no change in status, stable for surgery.  I have reviewed the patient's chart and labs.  Questions were answered to the patient's satisfaction.     Brayley Mackowiak

## 2020-02-11 NOTE — OR Nursing (Signed)
Patient has a rod in his left femur that he did not tell me about during my interview with him before surgery. Dr. Hampton Abbot was not aware of this either. Upon attempting to position the patient in lithotomy position, his left hip had no rotation, so I could not place his left leg in the yellow Allen stirrups. I made Dr. Hampton Abbot aware of this. He and Dr. Christian Mate positioned the patient in the [place].

## 2020-02-11 NOTE — Transfer of Care (Signed)
Immediate Anesthesia Transfer of Care Note  Patient: Russell [name]  Procedure(s) Performed: PARTIAL COLECTOMY -- sigmoid (N/A ) TAKE DOWN OF INTESTINAL FISTULA -- colovesical fistula (N/A )  Patient Location: PACU  Anesthesia Type:General  Level of Consciousness: sedated  Airway & Oxygen Therapy: Patient Spontanous Breathing and Patient connected to face mask oxygen  Post-op Assessment: Report given to RN and Post -op Vital signs reviewed and stable  Post vital signs: Reviewed and stable  Last Vitals:  Vitals Value Taken Time  BP 98/65 02/11/20 1520  Temp 36.2 C 02/11/20 1510  Pulse 87 02/11/20 1521  Resp 15 02/11/20 1521  SpO2 92 % 02/11/20 1521  Vitals shown include unvalidated device data.  Last Pain:  Vitals:   02/11/20 1510  TempSrc:   PainSc: 0-No pain         Complications: No apparent anesthesia complications

## 2020-02-11 NOTE — Anesthesia Procedure Notes (Signed)
Procedure Name: Intubation Date/Time: 02/11/2020 9:53 AM Performed by: Arita Miss, MD Pre-anesthesia Checklist: Patient identified, Patient being monitored, Timeout performed, Emergency Drugs available and Suction available Patient Re-evaluated:Patient Re-evaluated prior to induction Oxygen Delivery Method: Circle system utilized Preoxygenation: Pre-oxygenation with 100% oxygen Induction Type: IV induction Ventilation: Mask ventilation without difficulty Laryngoscope Size: 3 and McGraph Grade View: Grade I Tube type: Oral Tube size: 7.0 mm Number of attempts: 2 Airway Equipment and Method: Stylet and Fiberoptic brochoscope Placement Confirmation: ETT inserted through vocal cords under direct vision,  positive ETCO2 and breath sounds checked- equal and bilateral Secured at: 21 cm Tube secured with: Tape Dental Injury: Teeth and Oropharynx as per pre-operative assessment  Comments: Attempt made at 7.5 ETT first, with clear view of glottis but unable to pass tube past cords. Switched to 7.0 ETT and able to maneuver down past glottis. Patient with hx of tracheostomy, perhaps some element of subglottic stenosis. No trauma or bleeding noted. Confirmed bronchoscopically good position of ETT, no obvious masses or deformities in trachea.

## 2020-02-12 LAB — CBC WITH DIFFERENTIAL/PLATELET
Abs Immature Granulocytes: 0.05 10*3/uL (ref 0.00–0.07)
Basophils Absolute: 0 10*3/uL (ref 0.0–0.1)
Basophils Relative: 0 %
Eosinophils Absolute: 0 10*3/uL (ref 0.0–0.5)
Eosinophils Relative: 0 %
HCT: 37.2 % — ABNORMAL LOW (ref 39.0–52.0)
Hemoglobin: 12.6 g/dL — ABNORMAL LOW (ref 13.0–17.0)
Immature Granulocytes: 0 %
Lymphocytes Relative: 6 %
Lymphs Abs: 0.7 10*3/uL (ref 0.7–4.0)
MCH: 30.5 pg (ref 26.0–34.0)
MCHC: 33.9 g/dL (ref 30.0–36.0)
MCV: 90.1 fL (ref 80.0–100.0)
Monocytes Absolute: 0.5 10*3/uL (ref 0.1–1.0)
Monocytes Relative: 4 %
Neutro Abs: 10.9 10*3/uL — ABNORMAL HIGH (ref 1.7–7.7)
Neutrophils Relative %: 90 %
Platelets: 142 10*3/uL — ABNORMAL LOW (ref 150–400)
RBC: 4.13 MIL/uL — ABNORMAL LOW (ref 4.22–5.81)
RDW: 13.2 % (ref 11.5–15.5)
WBC: 12.2 10*3/uL — ABNORMAL HIGH (ref 4.0–10.5)
nRBC: 0 % (ref 0.0–0.2)

## 2020-02-12 LAB — BASIC METABOLIC PANEL
Anion gap: 7 (ref 5–15)
BUN: 19 mg/dL (ref 8–23)
CO2: 24 mmol/L (ref 22–32)
Calcium: 8.5 mg/dL — ABNORMAL LOW (ref 8.9–10.3)
Chloride: 112 mmol/L — ABNORMAL HIGH (ref 98–111)
Creatinine, Ser: 1.25 mg/dL — ABNORMAL HIGH (ref 0.61–1.24)
GFR calc Af Amer: >60 mL/min (ref >60)
GFR calc non Af Amer: 58 mL/min — ABNORMAL LOW (ref >60)
Glucose, Bld: 143 mg/dL — ABNORMAL HIGH (ref 70–99)
Potassium: 4.3 mmol/L (ref 3.5–5.1)
Sodium: 143 mmol/L (ref 135–145)

## 2020-02-12 LAB — MAGNESIUM: Magnesium: 2.3 mg/dL (ref 1.7–2.4)

## 2020-02-12 LAB — HIV ANTIBODY (ROUTINE TESTING W REFLEX): HIV Screen 4th Generation wRfx: NONREACTIVE

## 2020-02-12 MED ORDER — ALVIMOPAN 12 MG PO CAPS
12.0000 mg | ORAL_CAPSULE | Freq: Two times a day (BID) | ORAL | Status: DC
  Administered 2020-02-13 (×2): 12 mg via ORAL
  Filled 2020-02-12 (×3): qty 1

## 2020-02-12 NOTE — Evaluation (Signed)
Physical Therapy Evaluation Patient Details Name: Russell [name] [name]: [id number] DOB: February 28, 1951 Today's Date: 02/12/2020   History of Present Illness  Pt is a 69 yo male diagnosed with colovesical fistula and is s/p open sigmoidectomy, takedown of colovesical fistula, bladder repair, splenic flexure mobilization, and rigid sigmoidoscopy. PMH includes HTN, MI, HLD, back pain, and per pt LLE surgery with rod placement.    Clinical Impression  Tele-interpreter utilized during the session, Ricci Barker [phone].  Pt pleasant and motivated to participate during the session.  Pt reported no pain at rest but reported that he feels significant pain when he attempts to sit up from sup.  Pt educated on log roll sequencing and practiced rolling and going sidelying to sit for decreased abdominal strain.  Pt required extra effort to stand from an elevated EOB but seemed somewhat limited by chronic LLE ROM deficits.  Pt was able to amb 150' with a RW with slow but steady cadence and reported that he felt like he would benefit from having a RW at home.  Pt's SpO2 and HR were WNL throughout the session.  Pt will benefit from HHPT services upon discharge to safely address deficits listed in patient problem list for decreased caregiver assistance and eventual return to PLOF.      Follow Up Recommendations Home health PT    Equipment Recommendations  Rolling walker with 5" wheels    Recommendations for Other Services       Precautions / Restrictions Precautions Precautions: Fall Precaution Comments: Abdominal incision Restrictions Weight Bearing Restrictions: No      Mobility  Bed Mobility Overal bed mobility: Needs Assistance Bed Mobility: Rolling;Sidelying to Sit Rolling: Min assist Sidelying to sit: Min assist       General bed mobility comments: Log roll training for decreased abdominal pressure with min A and mod verbal cues for sequencing  Transfers Overall transfer level: Needs  assistance Equipment used: Rolling walker (2 wheeled) Transfers: Sit to/from Stand Sit to Stand: Min guard;From elevated surface         General transfer comment: Fair eccentric and concentric control  Ambulation/Gait Ambulation/Gait assistance: Min guard Gait Distance (Feet): 150 Feet Assistive device: Rolling walker (2 wheeled) Gait Pattern/deviations: Step-through pattern;Trunk flexed;Decreased step length - left;Decreased step length - right Gait velocity: decreased   General Gait Details: Slow cadence with decreased BLE step length but steady without LOB with SpO2 and HR WNL  Stairs            Wheelchair Mobility    Modified Rankin (Stroke Patients Only)       Balance Overall balance assessment: Needs assistance   Sitting balance-Leahy Scale: Good     Standing balance support: Bilateral upper extremity supported;During functional activity Standing balance-Leahy Scale: Good Standing balance comment: Min lean on the RW for support                             Pertinent Vitals/Pain Pain Assessment: No/denies pain    Home Living Family/patient expects to be discharged to:: Private residence Living Arrangements: Spouse/significant other Available Help at Discharge: Family;Available PRN/intermittently Type of Home: House Home Access: Stairs to enter Entrance Stairs-Rails: None Entrance Stairs-Number of Steps: 1 Home Layout: Two level;Bed/bath upstairs Home Equipment: Cane - single point      Prior Function Level of Independence: Independent with assistive device(s)         Comments: Mod Ind amb community distances with a SPC, Ind with ADLs,  no fall history     Hand Dominance        Extremity/Trunk Assessment   Upper Extremity Assessment Upper Extremity Assessment: Overall WFL for tasks assessed    Lower Extremity Assessment Lower Extremity Assessment: Generalized weakness;LLE deficits/detail LLE Deficits / Details: History of  ROM deficits per patient secondary to surgery and rod placement       Communication   Communication: Interpreter utilized;Other (comment)(Interpreter Ricci Barker [phone])  Cognition Arousal/Alertness: Awake/alert Behavior During Therapy: WFL for tasks assessed/performed Overall Cognitive Status: Within Functional Limits for tasks assessed                                        General Comments      Exercises Total Joint Exercises Ankle Circles/Pumps: Strengthening;Both;10 reps Quad Sets: Strengthening;Both;10 reps Gluteal Sets: Strengthening;Both;10 reps Heel Slides: AROM;Both;10 reps(limited on the LLE) Long Arc Quad: AROM;Strengthening;Right;10 reps Knee Flexion: AROM;Strengthening;Right;10 reps Other Exercises Other Exercises: Log roll training   Assessment/Plan    PT Assessment Patient needs continued PT services  PT Problem List Decreased strength;Decreased activity tolerance;Decreased mobility;Decreased knowledge of use of DME       PT Treatment Interventions DME instruction;Gait training;Stair training;Functional mobility training;Therapeutic activities;Therapeutic exercise;Balance training;Patient/family education    PT Goals (Current goals can be found in the Care Plan section)  Acute Rehab PT Goals Patient Stated Goal: To walk without a cane PT Goal Formulation: With patient Time For Goal Achievement: 02/25/20 Potential to Achieve Goals: Fair    Frequency Min 2X/week   Barriers to discharge        Co-evaluation               AM-PAC PT "6 Clicks" Mobility  Outcome Measure Help needed turning from your back to your side while in a flat bed without using bedrails?: A Little Help needed moving from lying on your back to sitting on the side of a flat bed without using bedrails?: A Little Help needed moving to and from a bed to a chair (including a wheelchair)?: A Little Help needed standing up from a chair using your arms (e.g., wheelchair  or bedside chair)?: A Little Help needed to walk in hospital room?: A Little Help needed climbing 3-5 steps with a railing? : A Little 6 Click Score: 18    End of Session Equipment Utilized During Treatment: Gait belt;Other (comment)(Under arms to avoid incision) Activity Tolerance: Patient tolerated treatment well Patient left: in chair;with chair alarm set;with call bell/phone within reach Nurse Communication: Mobility status PT Visit Diagnosis: Muscle weakness (generalized) (M62.81);Difficulty in walking, not elsewhere classified (R26.2)    Time: 1319-1410 PT Time Calculation (min) (ACUTE ONLY): 51 min   Charges:   PT Evaluation $PT Eval Moderate Complexity: 1 Mod PT Treatments $Therapeutic Exercise: 8-22 mins $Therapeutic Activity: 8-22 mins        D. Scott Nemesio Castrillon PT, DPT 02/12/20, 3:06 PM

## 2020-02-12 NOTE — Anesthesia Postprocedure Evaluation (Signed)
Anesthesia Post Note  Patient: Russell [name]  Procedure(s) Performed: PARTIAL COLECTOMY -- sigmoid (N/A ) TAKE DOWN OF INTESTINAL FISTULA -- colovesical fistula (N/A )  Patient location during evaluation: PACU Anesthesia Type: General Level of consciousness: awake and alert Pain management: pain level controlled Vital Signs Assessment: post-procedure vital signs reviewed and stable Respiratory status: spontaneous breathing, nonlabored ventilation, respiratory function stable and patient connected to nasal cannula oxygen Cardiovascular status: blood pressure returned to baseline and stable Postop Assessment: no apparent nausea or vomiting Anesthetic complications: no     Last Vitals:  Vitals:   02/12/20 0534 02/12/20 0850  BP: (!) 98/52 97/61  Pulse:  72  Resp:  16  Temp:  36.8 C  SpO2:  92%    Last Pain:  Vitals:   02/12/20 0724  TempSrc:   PainSc: 0-No pain                 Molli Barrows

## 2020-02-12 NOTE — Progress Notes (Signed)
[facility] Hospital Day(s): 1.   Post op day(s): 1 Day Post-Op.   Interval History:  Patient seen and examined no acute events or new complaints overnight.  Patient reports he is doing well, mild incisional soreness No nausea, emesis, or distension Mild leukocytosis, 12.2K, afebrile Slight bump in sCr 1.25, good UO - 2.9L On CLD, tolerating well No new complaints   Vital signs in last 24 hours: [min-max] current  Temp:  [97 F (36.1 C)-98.6 F (37 C)] 97.7 F (36.5 C) (03/24 0532) Pulse Rate:  [66-95] 66 (03/24 0532) Resp:  [15-23] 16 (03/24 0532) BP: (76-126)/(52-93) 98/52 (03/24 0534) SpO2:  [88 %-98 %] 96 % (03/24 0532) Weight:  [93.9 kg] 93.9 kg (03/23 0743)     Height: 5\' 10"  (177.8 cm) Weight: 93.9 kg BMI (Calculated): 29.7   Intake/Output last 2 shifts:  03/23 0701 - 03/24 0700 In: 2925 [I.V.:2625; IV Piggyback:300] Out: 3200 [Urine:2900; Blood:300]   Physical Exam:  Constitutional: alert, cooperative and no distress  Respiratory: breathing non-labored at rest  Cardiovascular: regular rate and sinus rhythm  Gastrointestinal: soft, mild incisional soreness, and non-distended. No rebound/guarding Genitourinary: foley in place Integumentary: laparotomy incision is CDI with staples, no erythema, minimal drainage on honeycomb  Labs:  CBC Latest Ref Rng & Units 02/12/2020 10/14/2019  WBC 4.0 - 10.5 K/uL 12.2(H) 6.3  Hemoglobin 13.0 - 17.0 g/dL 12.6(L) 14.9  Hematocrit 39.0 - 52.0 % 37.2(L) 45.9  Platelets 150 - 400 K/uL 142(L) 208   CMP Latest Ref Rng & Units 10/14/2019 09/06/2019  Glucose 65 - 99 mg/dL 127(H) -  BUN 8 - 27 mg/dL 16 -  Creatinine 0.76 - 1.27 mg/dL 0.98 1.10  Sodium 134 - 144 mmol/L 143 -  Potassium 3.5 - 5.2 mmol/L 4.2 -  Chloride 96 - 106 mmol/L 103 -  CO2 20 - 29 mmol/L 21 -  Calcium 8.6 - 10.2 mg/dL 9.7 -  Total Protein 6.0 - 8.5 g/dL 6.8 -  Total Bilirubin 0.0 - 1.2 mg/dL 0.4 -  Alkaline Phos 39  - 117 IU/L 105 -  AST 0 - 40 IU/L 16 -  ALT 0 - 44 IU/L 27 -     Imaging studies: No new pertinent imaging studies   Assessment/Plan:  69 y.o. male overall doing well 1 Day Post-Op s/p sigmoid colectomy and bladder repair for colovesical fistula.   - Continue CLD  - Continue IVF resuscitation  - ABx for 24 hours peri-operatively  - pain control prn; antiemetics prn    - Start Entereg today  - Continue foley x10 days  - monitor abdominal examination; on-going bowel function  - mobilization; will engage PT  - medical management of comorbid conditions  - okay to resume DVT prophylaxis today  All of the above findings and recommendations were discussed with the patient, and the medical team, and all of patient's questions were answered to her expressed satisfaction.  -- Edison Simon, PA-C [facility] 02/12/2020, 7:23 AM [phone] M-F: 7am - 4pm

## 2020-02-13 LAB — BASIC METABOLIC PANEL
Anion gap: 5 (ref 5–15)
BUN: 16 mg/dL (ref 8–23)
CO2: 25 mmol/L (ref 22–32)
Calcium: 8.2 mg/dL — ABNORMAL LOW (ref 8.9–10.3)
Chloride: 112 mmol/L — ABNORMAL HIGH (ref 98–111)
Creatinine, Ser: 1.06 mg/dL (ref 0.61–1.24)
GFR calc Af Amer: >60 mL/min (ref >60)
GFR calc non Af Amer: >60 mL/min (ref >60)
Glucose, Bld: 94 mg/dL (ref 70–99)
Potassium: 4 mmol/L (ref 3.5–5.1)
Sodium: 142 mmol/L (ref 135–145)

## 2020-02-13 LAB — SURGICAL PATHOLOGY

## 2020-02-13 NOTE — TOC Initial Note (Addendum)
Transition of Care [facility]) - Initial/Assessment Note    Patient Details  Name: Caston Coopersmith [name]: [id number] Date of Birth: 08/29/1951  Transition of Care [facility]) CM/SW Contact:    Candie Chroman, LCSW Phone Number: 02/13/2020, 12:09 PM  Clinical Narrative:  CSW met with patient. No supports at bedside. CSW introduced role and explained that PT recommendations would be discussed. Patient agreeable to home health PT. Provided CMS scores for agencies that serve his zip code. Will follow up later today with preferences. PT also recommended a rolling walker but patient prefers to use the cane he has at home. No further concerns. CSW encouraged patient to contact CSW as needed. CSW will continue to follow patient for support and facilitate return home when stable.                3:19 pm: Met with patient to see if he had any home health preferences. Patient was sleeping and asked that CSW return in the morning.  Expected Discharge Plan: [place] Barriers to Discharge: Continued Medical Work up   Patient Goals and CMS Choice   CMS Medicare.gov Compare Post Acute Care list provided to:: Patient    Expected Discharge Plan and Services Expected Discharge Plan: [place] Choice: [place] arrangements for the past 2 months: Single Family Home                                      Prior Living Arrangements/Services Living arrangements for the past 2 months: Single Family Home Lives with:: Spouse Patient language and need for interpreter reviewed:: Yes Do you feel safe going back to the place where you live?: Yes      Need for Family Participation in Patient Care: Yes (Comment) Care giver support system in place?: Yes (comment) Current home services: DME Criminal Activity/Legal Involvement Pertinent to Current Situation/Hospitalization: No - Comment as needed  Activities of Daily Living Home Assistive  Devices/Equipment: Cane (specify quad or straight) ADL Screening (condition at time of admission) Patient's cognitive ability adequate to safely complete daily activities?: Yes Is the patient deaf or have difficulty hearing?: No Does the patient have difficulty seeing, even when wearing glasses/contacts?: No Does the patient have difficulty concentrating, remembering, or making decisions?: No Patient able to express need for assistance with ADLs?: No Does the patient have difficulty dressing or bathing?: No Independently performs ADLs?: Yes (appropriate for developmental age) Does the patient have difficulty walking or climbing stairs?: No Weakness of Legs: Left Weakness of Arms/Hands: None  Permission Sought/Granted Permission sought to share information with : Facility Art therapist granted to share information with : Yes, Verbal Permission Granted     Permission granted to share info w AGENCY: Home Health Agencies        Emotional Assessment Appearance:: Appears stated age Attitude/Demeanor/Rapport: Engaged, Gracious Affect (typically observed): Accepting, Appropriate, Calm, Pleasant Orientation: : Oriented to Self, Oriented to Place, Oriented to  Time, Oriented to Situation Alcohol / Substance Use: Not Applicable Psych Involvement: No (comment)  Admission diagnosis:  Colovesical fistula [N32.1] Patient Active Problem List   Diagnosis Date Noted  . 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  . Carpal joint sprain 09/11/2019  . Cervical spondylosis without myelopathy 09/11/2019  . DDD (degenerative disc disease), cervical 09/11/2019  . Lumbosacral stenosis 08/30/2019  . Degeneration of lumbosacral intervertebral disc 08/30/2019  .  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 Hypotension 02/10/2018  . Acute blood loss anemia 02/10/2018  . S/P spinal fusion 01/17/2018  . Other specified abnormal immunological findings in serum 11/27/2017  . Postoperative or  surgical complication 96/43/8381  . 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[place]) 11/25/2017  . Fluid collection at surgical site 11/25/2017  . Delirium due to another medical condition 10/05/2017  . BMI 26.0-26.9,adult 06/26/2017  . GERD (gastroesophageal reflux disease) 05/09/2017  . Chronic cough 11/21/2016  . Heterotopic ossification 03/01/2016  . 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 Pyuria 11/27/2015  . Oropharyngeal dysphagia 11/27/2015  . Macrocytic anemia 11/27/2015  . History of pulmonary embolism 11/27/2015  . History of malnutrition 11/27/2015  . Acute encephalopathy 11/27/2015  . 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 Urinary retention 11/27/2015  . Personal history of other malignant neoplasm of skin 07/17/2015  . Hyperlipidemia, mixed 05/05/2015  . Excessive cerumen in both ear canals 04/23/2015  . Skin lesions, generalized 04/23/2015  . Need for vaccination 04/23/2015  . Increased frequency of urination 04/23/2015  . Helicobacter pylori (H. pylori) infection 02/11/2014  . Dyspepsia 02/05/2014  . Neck pain 10/11/2013  . Pain in joint, pelvic region and thigh 10/11/2013  . Low back pain 10/11/2013   PCP:  System, Pcp Not In Pharmacy:   [facility] [address], [place] - [place] [address] Phone: [phone] Fax: [phone]     Social Determinants of Health (SDOH) Interventions    Readmission Risk Interventions No flowsheet data found.

## 2020-02-13 NOTE — Progress Notes (Signed)
[place] Hospital Day(s): 2.   Post op day(s): 2 Days Post-Op.   Interval History:  Patient seen and examined no acute events or new complaints overnight.  Patient reports he is feeling good Some incisional soreness but this is improved No fever, chills, nausea, or emesis Renal function normalized, UO - 3.8L Advanced to full liquid diet, tolerating well, + BM Mobilizing No new complaints.    Vital signs in last 24 hours: [min-max] current  Temp:  [97.8 F (36.6 C)-98.3 F (36.8 C)] 97.8 F (36.6 C) (03/25 0416) Pulse Rate:  [59-72] 64 (03/25 0416) Resp:  [16-20] 18 (03/25 0416) BP: (97-104)/(61-69) 103/69 (03/25 0416) SpO2:  [91 %-97 %] 91 % (03/25 0416)     Height: 5\' 10"  (177.8 cm) Weight: 93.9 kg BMI (Calculated): 29.7   Intake/Output last 2 shifts:  03/24 0701 - 03/25 0700 In: 0  Out: 3875 [Urine:3875]   Physical Exam:  Constitutional: alert, cooperative and no distress  Respiratory: breathing non-labored at rest  Cardiovascular: regular rate and sinus rhythm  Gastrointestinal: soft, mild incisional soreness, and non-distended. No rebound/guarding Genitourinary: foley in place Integumentary: laparotomy incision is CDI with staples, no erythema, minimal drainage on honeycomb   Labs:  CBC Latest Ref Rng & Units 02/12/2020 10/14/2019  WBC 4.0 - 10.5 K/uL 12.2(H) 6.3  Hemoglobin 13.0 - 17.0 g/dL 12.6(L) 14.9  Hematocrit 39.0 - 52.0 % 37.2(L) 45.9  Platelets 150 - 400 K/uL 142(L) 208   CMP Latest Ref Rng & Units 02/13/2020 02/12/2020 10/14/2019  Glucose 70 - 99 mg/dL 94 143(H) 127(H)  BUN 8 - 23 mg/dL 16 19 16   Creatinine 0.61 - 1.24 mg/dL 1.06 1.25(H) 0.98  Sodium 135 - 145 mmol/L 142 143 143  Potassium 3.5 - 5.1 mmol/L 4.0 4.3 4.2  Chloride 98 - 111 mmol/L 112(H) 112(H) 103  CO2 22 - 32 mmol/L 25 24 21   Calcium 8.9 - 10.3 mg/dL 8.2(L) 8.5(L) 9.7  Total Protein 6.0 - 8.5 g/dL - - 6.8  Total Bilirubin 0.0 - 1.2 mg/dL  - - 0.4  Alkaline Phos 39 - 117 IU/L - - 105  AST 0 - 40 IU/L - - 16  ALT 0 - 44 IU/L - - 27     Imaging studies: No new pertinent imaging studies   Assessment/Plan:  69 y.o. 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   - Advance to full liquids; ADAT             - D/C IVF resuscitation             - pain control prn; antiemetics prn               - Stop Entereg given return of bowel function              - Continue foley x10 days; good UO             - monitor abdominal examination; on-going bowel function             - mobilization; will engage PT --> recommend HHPT             - medical management of comorbid conditions             - DVT Prophylaxis   - Discharge Planning: Home in 24-48 hours if tolerates advancement of diet and continues to do well  clinically.   All of the above findings and recommendations were discussed with the patient, and the medical team, and all of patient's questions were answered to his expressed satisfaction.  -- Edison Simon, PA-C [facility] 02/13/2020, 7:38 AM [phone] M-F: 7am - 4pm

## 2020-02-14 MED ORDER — OXYCODONE HCL 5 MG PO TABS: 5.0000 mg | ORAL_TABLET | ORAL | 0 refills | Status: DC | PRN

## 2020-02-14 MED ORDER — IBUPROFEN 800 MG PO TABS: 800.0000 mg | ORAL_TABLET | Freq: Three times a day (TID) | ORAL | 0 refills | Status: DC | PRN

## 2020-02-14 NOTE — TOC Transition Note (Addendum)
Transition of Care [facility]) - CM/SW Discharge Note   Patient Details  Name: Russell [name] [name]: [id number] Date of Birth: August 30, 1951  Transition of Care [facility]) CM/SW Contact:  Ova Freshwater Phone Number: [phone] 02/14/2020, 10:42 AM   Clinical Narrative:     Spoke with patient as per request by Felicita Gage, CSW, LCSW, (patient's first language is Spanish) to confirm transition of care after discharge.  [name] stated he does not need home health and is fine walking with a cane and refused a walker.  [name], who was in the room with the patient and stated if they found they needed home health they have company they have used in the past which they have been very satisfied with. [name] stated she would contact this CSW if she needed a recommendation for home health in the future.  Patient will discharge home; refused home health and walker.    Barriers to Discharge: Continued Medical Work up   Patient Goals and CMS Choice   CMS Medicare.gov Compare Post Acute Care list provided to:: Patient    Discharge Placement                       Discharge Plan and Services     Post Acute Care Choice: Home Health                               Social Determinants of Health (SDOH) Interventions     Readmission Risk Interventions No flowsheet data found.

## 2020-02-14 NOTE — Discharge Summary (Signed)
[facility] SURGICAL DISCHARGE SUMMARY  Patient ID: Russell [name] [name]: [id number] DOB/AGE: Dec 27, 1950 69 y.o.  Admit date: 02/11/2020 Discharge date: 02/14/2020  Discharge Diagnoses Patient Active Problem List   Diagnosis Date Noted  . 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 male who presents with diagnosis of colovesical fistula. He had prior workup with GI and Urology which ruled out malignancy. He presents to [facility] on 02/11/2020 for scheduled sigmoid colectomy with Dr Hampton Abbot.   Hospital Course: Informed consent was obtained and documented, and patient underwent uneventful open sigmoidectomy, takedown of colovesical fistula, bladder repair, splenic flexure mobilization, rigid sigmoidoscopy (Dr Hampton Abbot, 02/11/2020).  Post-operatively, patient's did well and had return of bowel function on POD1. Advancement of patient's diet over the next few days and ambulation were well-tolerated. He had persistent bowel function throughout post-operative course. The remainder of patient's hospital course was essentially unremarkable, and discharge planning was initiated accordingly with patient safely able to be discharged home with appropriate discharge instructions, pain control, and outpatient follow-up after all of her and his wife's questions were answered to their expressed satisfaction.   Discharge Condition: Good   Physical Examination:  Constitutional: alert, cooperative and no distress  Respiratory: breathing non-labored at rest  Cardiovascular: regular rate and sinus rhythm  Gastrointestinal: soft,mild incisional soreness, and non-distended. No rebound/guarding Genitourinary:foley in place, good UO Integumentary:laparotomy incision is CDI with staples, no erythema   Allergies as of  02/14/2020   No Known Allergies     Medication List    TAKE these medications   atorvastatin 20 MG tablet Commonly known as: LIPITOR Take 20 mg by mouth daily.   doxycycline 100 MG capsule Commonly known as: VIBRAMYCIN Take 100 mg by mouth daily.   ibuprofen 800 MG tablet Commonly known as: ADVIL Take 1 tablet (800 mg total) by mouth every 8 (eight) hours as needed.   oxyCODONE 5 MG immediate release tablet Commonly known as: Oxy IR/ROXICODONE Take 1 tablet (5 mg total) by mouth every 4 (four) hours as needed for moderate pain, severe pain or breakthrough pain.   tadalafil 5 MG tablet Commonly known as: CIALIS Take 1 tablet (5 mg total) by mouth daily as needed for erectile dysfunction.        Follow-up Information    Piscoya, Jacqulyn Bath, MD. Schedule an appointment as soon as possible for a visit in 1 week(s).   Specialty: General Surgery Why: s/p open sigmoid colectomy, repair of colovesical fistula, has staples Contact information: [address] [place] [address] [phone]        Abbie Sons, MD. Schedule an appointment as soon as possible for a visit in 1 week(s).   Specialty: Urology Why: s/p repair of colovesical fistula, has foley in palce Contact information: [place] [address] [phone]            Time spent on discharge management including discussion of hospital course, clinical condition, outpatient instructions, prescriptions, and follow up with the patient and members of the medical team: >30 minutes  -- Edison Simon , PA-C [facility]  02/14/2020, 9:42 AM [phone] M-F: 7am - 4pm

## 2020-02-14 NOTE — Discharge Instructions (Signed)
Follow up with general surgery (Dr Hampton Abbot) in 1 week Follow up with urology (Dr Bernardo Heater) in 1 week Continue foley catheter until seen by urology No lifting more than 15-20 lbs for 4-6 weeks Okay to shower at home, do not scrub incision, pat dry Do not drive until cleared by surgery

## 2020-02-18 ENCOUNTER — Encounter: Payer: Self-pay | Admitting: Physician Assistant

## 2020-02-18 ENCOUNTER — Other Ambulatory Visit: Payer: Self-pay

## 2020-02-18 ENCOUNTER — Ambulatory Visit: Payer: Medicare Other | Admitting: Physician Assistant

## 2020-02-18 ENCOUNTER — Ambulatory Visit (INDEPENDENT_AMBULATORY_CARE_PROVIDER_SITE_OTHER): Payer: Medicare Other | Admitting: Physician Assistant

## 2020-02-18 VITALS — BP 118/82 | HR 101 | Temp 98.6°F | Ht 70.0 in | Wt 204.0 lb

## 2020-02-18 DIAGNOSIS — N321 Vesicointestinal fistula: Secondary | ICD-10-CM | POA: Diagnosis not present

## 2020-02-18 NOTE — Progress Notes (Signed)
02/18/2020 10:26 AM   Russell [name] [birth year] [id number]  CC: Postop follow-up  HPI: Russell [name] is a 69 y.o. male who presents today for postop evaluation.  He is POD 7 from open sigmoidectomy with takedown of colovesical fistula, bladder repair, splenic flexure mobilization, and rigid sigmoidoscopy with Dr. Hampton Abbot.  He was previously seen by Dr. Bernardo Heater on 09/13/2019 for cystoscopy with findings consistent with colovesical fistula and was counseled to follow-up with general surgery for management of this.  He is accompanied today by his wife, who contributes to HPI.  Surgery was noted to be without complications.  Bladder repair was completed with 2 layers of Vicryl 2-0 sutures.  No bladder leakage was noted during the case.  Today, patient reports being concerned for possible infection.  He states he occasionally feels febrile, however he has not been taking his temperature at home.  He has been taking ibuprofen for pain management and suspects this may be suppressing fevers.  Foley catheter in place today draining clear, yellow urine, with some gas noted in his leg bag.  PMH: Past Medical History:  Diagnosis Date  . Arthritis   . Back pain   . 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 Hyperlipidemia   . Hypertension     Surgical History: Past Surgical History:  Procedure Laterality Date  . BACK SURGERY    . COLONOSCOPY WITH PROPOFOL N/A 11/05/2019   Procedure: COLONOSCOPY WITH PROPOFOL;  Surgeon: [name], MD;  Location: [facility] ENDOSCOPY;  Service: Gastroenterology;  Laterality: N/A;  . PARTIAL COLECTOMY N/A 02/11/2020   Procedure: PARTIAL COLECTOMY -- sigmoid;  Surgeon: Olean Ree, MD;  Location: [facility] ORS;  Service: General;  Laterality: N/A;  . SPINE SURGERY    . TAKE DOWN OF INTESTINAL FISTULA N/A 02/11/2020   Procedure: TAKE DOWN OF INTESTINAL FISTULA -- colovesical fistula;  Surgeon: Olean Ree, MD;  Location: [facility] ORS;  Service: General;  Laterality: N/A;  .  TRACHEAL SURGERY      Home Medications:  Allergies as of 02/18/2020   No Known Allergies     Medication List       Accurate as of February 18, 2020 10:26 AM. If you have any questions, ask your nurse or doctor.        STOP taking these medications   tadalafil 5 MG tablet Commonly known as: CIALIS Stopped by: Debroah Loop, PA-C     TAKE these medications   atorvastatin 20 MG tablet Commonly known as: LIPITOR Take 20 mg by mouth daily.   doxycycline 100 MG capsule Commonly known as: VIBRAMYCIN Take 100 mg by mouth daily.   ibuprofen 800 MG tablet Commonly known as: ADVIL Take 1 tablet (800 mg total) by mouth every 8 (eight) hours as needed.   oxyCODONE 5 MG immediate release tablet Commonly known as: Oxy IR/ROXICODONE Take 1 tablet (5 mg total) by mouth every 4 (four) hours as needed for moderate pain, severe pain or breakthrough pain.       Allergies:  No Known Allergies  Family History: Family History  Problem Relation Age of Onset  . Prostate cancer Neg Hx   . Bladder Cancer Neg Hx   . Kidney cancer Neg Hx     Social History:   reports that he has never smoked. He has never used smokeless tobacco. He reports that he does not drink alcohol or use drugs.  Physical Exam: BP 118/82   Pulse (!) 101   Temp 98.6 F (37 C) (Oral)   Ht 5'  10" (1.778 m)   Wt 204 lb (92.5 kg)   BMI 29.27 kg/m   Constitutional:  Alert and oriented, no acute distress, nontoxic appearing HEENT: [place], AT Cardiovascular: No clubbing, cyanosis, or edema Respiratory: Normal respiratory effort, no increased work of breathing GI: Midline abdominal incision noted with intact sutures and staples.  Clearly demarcated margin of mild erythema and warmth surrounding the incision without purulence.  Abdomen soft and slightly tender throughout without rigidity or guarding. GU: See HPI Skin: No rashes, bruises or suspicious lesions Neurologic: Grossly intact, no focal deficits, moving  all 4 extremities Psychiatric: Normal mood and affect  Assessment & Plan:   1. Colovesical fistula 69 year old male s/p colovesical fistula takedown with bladder repair and sigmoidectomy presents today for postop assessment with Foley catheter in place.  VSS today, patient is afebrile; normal postoperative changes of the abdomen without overt infective signs.  Given his reports of subjective fevers, I recommended monitoring his temperature at home and counseled him to contact Dr. Hampton Abbot immediately if he develops a fever over 101F.  I counseled him that I would defer Foley catheter removal today pending x-ray cystogram in 2 days.  I explained that the study will verify whether or not his bladder repair has fully healed.  I explained that if there is no contrast leakage outside of the bladder on the study, we will proceed with Foley catheter removal in office that day.  Patient is in agreement with this plan. - Cystogram; Future  Return in about 2 days (around 02/20/2020) for Cystogram with follow-up catheter removal.  Debroah Loop, [facility]  [place] [address] [place], [place] [address] [phone]

## 2020-02-20 ENCOUNTER — Other Ambulatory Visit: Payer: Self-pay

## 2020-02-20 ENCOUNTER — Ambulatory Visit
Admission: RE | Admit: 2020-02-20 | Discharge: 2020-02-20 | Disposition: A | Discharge status: Home / Self Care | Payer: Medicare Other | Source: Ambulatory Visit | Attending: Physician Assistant | Admitting: Physician Assistant

## 2020-02-20 ENCOUNTER — Ambulatory Visit (INDEPENDENT_AMBULATORY_CARE_PROVIDER_SITE_OTHER): Payer: Medicare Other | Admitting: Physician Assistant

## 2020-02-20 ENCOUNTER — Encounter: Payer: Self-pay | Admitting: Physician Assistant

## 2020-02-20 ENCOUNTER — Ambulatory Visit: Payer: Medicare Other | Admitting: Physician Assistant

## 2020-02-20 ENCOUNTER — Emergency Department
Admission: EM | Admit: 2020-02-20 | Discharge: 2020-02-20 | Disposition: A | Discharge status: Home / Self Care | Payer: Medicare Other | Attending: Emergency Medicine | Admitting: Emergency Medicine

## 2020-02-20 VITALS — BP 108/74 | HR 103 | Ht 67.0 in | Wt 200.0 lb

## 2020-02-20 DIAGNOSIS — Z9049 Acquired absence of other specified parts of digestive tract: Secondary | ICD-10-CM | POA: Diagnosis not present

## 2020-02-20 DIAGNOSIS — N321 Vesicointestinal fistula: Secondary | ICD-10-CM | POA: Insufficient documentation

## 2020-02-20 DIAGNOSIS — Z9889 Other specified postprocedural states: Secondary | ICD-10-CM | POA: Diagnosis not present

## 2020-02-20 DIAGNOSIS — Y69 Unspecified misadventure during surgical and medical care: Secondary | ICD-10-CM | POA: Insufficient documentation

## 2020-02-20 DIAGNOSIS — T8149XA Infection following a procedure, other surgical site, initial encounter: Secondary | ICD-10-CM

## 2020-02-20 DIAGNOSIS — K91840 Postprocedural hemorrhage and hematoma of a digestive system organ or structure following a digestive system procedure: Secondary | ICD-10-CM | POA: Diagnosis present

## 2020-02-20 DIAGNOSIS — T8131XA Disruption of external operation (surgical) wound, not elsewhere classified, initial encounter: Secondary | ICD-10-CM | POA: Insufficient documentation

## 2020-02-20 DIAGNOSIS — T8130XA Disruption of wound, unspecified, initial encounter: Secondary | ICD-10-CM

## 2020-02-20 MED ORDER — IOTHALAMATE MEGLUMINE 17.2 % UR SOLN
250.0000 mL | Freq: Once | URETHRAL | Status: AC | PRN
  Administered 2020-02-20: 14:00:00 250 mL

## 2020-02-20 MED ORDER — AMOXICILLIN-POT CLAVULANATE 875-125 MG PO TABS: 1.0000 | ORAL_TABLET | Freq: Two times a day (BID) | ORAL | 0 refills | Status: AC

## 2020-02-20 NOTE — ED Notes (Signed)
Pt given warm blanket. Bed locked low. Rail up. Call bell within reach.

## 2020-02-20 NOTE — Progress Notes (Signed)
02/20/2020 3:21 PM   Russell [name] [birth year] [id number]  CC: Foley catheter removal  HPI: Russell [name] is a 69 y.o. male who presents today for Foley catheter removal.  He is POD 9 from open sigmoidectomy with takedown of colovesical fistula, bladder repair, splenic flexure mobilization, and rigid sigmoidoscopy with Dr. Hampton Abbot.  He was seen in the ED earlier today with abdominal incision dehiscence consistent with likely seroma/hematoma.  He was evaluated by the general surgery team, wound was packed, and he was started on Augmentin.  He is scheduled for follow-up with Dr. Hampton Abbot next week.  He underwent cystogram today with no evidence of contrast media leakage outside of the bladder.  He proceeded to clinic for Foley catheter removal.  PMH: Past Medical History:  Diagnosis Date  . Arthritis   . Back pain   . 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 Hyperlipidemia   . Hypertension     Surgical History: Past Surgical History:  Procedure Laterality Date  . BACK SURGERY    . COLONOSCOPY WITH PROPOFOL N/A 11/05/2019   Procedure: COLONOSCOPY WITH PROPOFOL;  Surgeon: [name], MD;  Location: [facility] ENDOSCOPY;  Service: Gastroenterology;  Laterality: N/A;  . PARTIAL COLECTOMY N/A 02/11/2020   Procedure: PARTIAL COLECTOMY -- sigmoid;  Surgeon: Olean Ree, MD;  Location: [facility] ORS;  Service: General;  Laterality: N/A;  . SPINE SURGERY    . TAKE DOWN OF INTESTINAL FISTULA N/A 02/11/2020   Procedure: TAKE DOWN OF INTESTINAL FISTULA -- colovesical fistula;  Surgeon: Olean Ree, MD;  Location: [facility] ORS;  Service: General;  Laterality: N/A;  . TRACHEAL SURGERY      Home Medications:  Allergies as of 02/20/2020   No Known Allergies     Medication List       Accurate as of February 20, 2020  3:21 PM. If you have any questions, ask your nurse or doctor.        amoxicillin-clavulanate 875-125 MG tablet Commonly known as: Augmentin Take 1 tablet by mouth 2 (two) times daily for 7  days.   atorvastatin 20 MG tablet Commonly known as: LIPITOR Take 20 mg by mouth daily.   doxycycline 100 MG capsule Commonly known as: VIBRAMYCIN Take 100 mg by mouth daily.   ibuprofen 800 MG tablet Commonly known as: ADVIL Take 1 tablet (800 mg total) by mouth every 8 (eight) hours as needed.   oxyCODONE 5 MG immediate release tablet Commonly known as: Oxy IR/ROXICODONE Take 1 tablet (5 mg total) by mouth every 4 (four) hours as needed for moderate pain, severe pain or breakthrough pain.       Allergies:  No Known Allergies  Family History: Family History  Problem Relation Age of Onset  . Prostate cancer Neg Hx   . Bladder Cancer Neg Hx   . Kidney cancer Neg Hx     Social History:   reports that he has never smoked. He has never used smokeless tobacco. He reports that he does not drink alcohol or use drugs.  Physical Exam: BP 108/74   Pulse (!) 103   Ht 5\' 7"  (1.702 m)   Wt 200 lb (90.7 kg)   BMI 31.32 kg/m   Constitutional:  Alert and oriented, no acute distress, nontoxic appearing HEENT: [place], AT Cardiovascular: No clubbing, cyanosis, or edema Respiratory: Normal respiratory effort, no increased work of breathing GI: Surgical incision visualized with wound dressing covering the portion inferior to the umbilicus with serosanguineous drainage visible on the gauze. GU: Foley catheter in place  draining clear, yellow urine Skin: No rashes, bruises or suspicious lesions Neurologic: Grossly intact, no focal deficits, moving all 4 extremities Psychiatric: Normal mood and affect  Pertinent Imaging: Cystogram, 02/20/2020: CLINICAL DATA:  Status post surgery for colovesical fistula and bladder repair.  EXAM: CYSTOGRAM  TECHNIQUE: Through the existing Foley catheter the bladder was filled with 60 mL Cysto-Hypaque. The patient could tolerate no more contrast. Spot images obtained in AP, oblique, and lateral positions. Following the procedure contrast was drained  and Foley catheter placed back to bag drainage.  FLUOROSCOPY TIME:  Fluoroscopy Time:  1 minutes 30 seconds.  Radiation Exposure Index (if provided by the fluoroscopic device): 99.5 mGy  COMPARISON:  MRI of the pelvis 11/02/2019. CT of the abdomen pelvis 09/06/2019.  FINDINGS: Scout film unremarkable. Surgical staples are noted over the pelvis. Pelvic calcifications consistent phleboliths. Contrast was introduced through the existing Foley catheter. Bladder appears unremarkable. No fistula noted. No evidence of contrast leakage.  IMPRESSION: Negative exam. No evidence of fistula noted. No evidence of contrast leakage. Foley catheter in bladder.   Electronically Signed   By: Marcello Moores  Register   On: 02/20/2020 14:33  I personally reviewed the images referenced above and note the absence of contrast leakage outside the bladder.  Assessment & Plan:   1. Colovesical fistula Foley catheter removed in clinic today.  See separate procedure note for details.  Patient tolerated well, no complications noted.  Counseled patient to keep his scheduled follow-up with Dr. Hampton Abbot next week.  Can follow-up with urology as needed.  Return if symptoms worsen or fail to improve.  Debroah Loop, PA-C  [facility] [address] [place], [place] [address] [phone]

## 2020-02-20 NOTE — ED Notes (Signed)
Patient in Radiology, finishing urology procedure.  Patient discharged home.

## 2020-02-20 NOTE — Consult Note (Signed)
[facility] SURGICAL CONSULTATION NOTE (initial) - [id number]   HISTORY OF PRESENT ILLNESS (HPI):  69 y.o. male presented to [facility] ED today for evaluation of his post-op wound. Patient was reportedly in radiology this morning to undergo cystogram secondary to indwelling catheter for intra-operative bladder repair, when he was found to have a copious amount of serosanguinous drainage from his incision. Due to this he was referred to the ED. He reports that this happened suddenly and had no issues with the wound prior. No fever or chills at home. Otherwise doing well. No additional complaints.   Surgery is consulted by emergency medicine physician Dr. Cherylann Banas, MD in this context for evaluation and management of post-op wound.  PAST MEDICAL HISTORY (PMH):  Past Medical History:  Diagnosis Date  . Arthritis   . Back pain   . 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 Hyperlipidemia   . Hypertension      PAST SURGICAL HISTORY ([place]):  Past Surgical History:  Procedure Laterality Date  . BACK SURGERY    . COLONOSCOPY WITH PROPOFOL N/A 11/05/2019   Procedure: COLONOSCOPY WITH PROPOFOL;  Surgeon: [name], MD;  Location: [facility] ENDOSCOPY;  Service: Gastroenterology;  Laterality: N/A;  . PARTIAL COLECTOMY N/A 02/11/2020   Procedure: PARTIAL COLECTOMY -- sigmoid;  Surgeon: Olean Ree, MD;  Location: [facility] ORS;  Service: General;  Laterality: N/A;  . SPINE SURGERY    . TAKE DOWN OF INTESTINAL FISTULA N/A 02/11/2020   Procedure: TAKE DOWN OF INTESTINAL FISTULA -- colovesical fistula;  Surgeon: Olean Ree, MD;  Location: [facility] ORS;  Service: General;  Laterality: N/A;  . TRACHEAL SURGERY       MEDICATIONS:  Prior to Admission medications   Medication Sig Start Date End Date Taking? Authorizing Provider  atorvastatin (LIPITOR) 20 MG tablet Take 20 mg by mouth daily. 08/20/19   [provider]  doxycycline (VIBRAMYCIN) 100 MG capsule Take 100 mg by mouth daily. 01/03/20    [provider]  ibuprofen (ADVIL) 800 MG tablet Take 1 tablet (800 mg total) by mouth every 8 (eight) hours as needed. 02/14/20   Tylene Fantasia, PA-C  oxyCODONE (OXY IR/ROXICODONE) 5 MG immediate release tablet Take 1 tablet (5 mg total) by mouth every 4 (four) hours as needed for moderate pain, severe pain or breakthrough pain. 02/14/20   Tylene Fantasia, PA-C     ALLERGIES:  No Known Allergies   SOCIAL HISTORY:  Social History   Socioeconomic History  . Marital status: Married    Spouse name: Not on file  . Number of children: Not on file  . Years of education: Not on file  . Highest education level: Not on file  Occupational History  . Not on file  Tobacco Use  . Smoking status: Never Smoker  . Smokeless tobacco: Never Used  Substance and Sexual Activity  . Alcohol use: No  . Drug use: No  . Sexual activity: Yes    Birth control/protection: None  Other Topics Concern  . Not on file  Social History Narrative  . Not on file   Social Determinants of Health   Financial Resource Strain:   . Difficulty of Paying Living Expenses:   Food Insecurity:   . Worried About Charity fundraiser in the Last Year:   . Arboriculturist in the Last Year:   Transportation Needs:   . Film/video editor (Medical):   Marland Kitchen Lack of Transportation (Non-Medical):   Physical Activity:   .  Days of Exercise per Week:   . Minutes of Exercise per Session:   Stress:   . Feeling of Stress :   Social Connections:   . Frequency of Communication with Friends and Family:   . Frequency of Social Gatherings with Friends and Family:   . Attends Religious Services:   . Active Member of Clubs or Organizations:   . Attends Archivist Meetings:   Marland Kitchen Marital Status:   Intimate Partner Violence:   . Fear of Current or Ex-Partner:   . Emotionally Abused:   Marland Kitchen Physically Abused:   . Sexually Abused:      FAMILY HISTORY:  Family History  Problem Relation Age of Onset  . Prostate  cancer Neg Hx   . Bladder Cancer Neg Hx   . Kidney cancer Neg Hx       REVIEW OF SYSTEMS:  Review of Systems  Constitutional: Negative for chills and fever.  HENT: Negative for congestion and sore throat.   Respiratory: Negative for cough and shortness of breath.   Cardiovascular: Negative for chest pain and palpitations.  Gastrointestinal: Negative for abdominal pain, diarrhea, nausea and vomiting.  Skin:       + Post-op Wound Drainage  All other systems reviewed and are negative.   VITAL SIGNS:  Temp:  [97.7 F (36.5 C)] 97.7 F (36.5 C) (04/01 1145) Pulse Rate:  [84] 84 (04/01 1139) Resp:  [17] 17 (04/01 1139) BP: (121)/(84) 121/84 (04/01 1139) SpO2:  [99 %] 99 % (04/01 1139) Weight:  [90.7 kg] 90.7 kg (04/01 1146)     Height: 5\' 11"  (180.3 cm) Weight: 90.7 kg BMI (Calculated): 27.91   INTAKE/OUTPUT:  No intake/output data recorded.  PHYSICAL EXAM:  Physical Exam Vitals and nursing note reviewed.  Constitutional:      General: He is not in acute distress.    Appearance: Normal appearance. He is normal weight. He is not ill-appearing.  HENT:     Head: Normocephalic and atraumatic.  Eyes:     General: No scleral icterus.    Conjunctiva/sclera: Conjunctivae normal.  Cardiovascular:     Rate and Rhythm: Normal rate.     Pulses: Normal pulses.     Heart sounds: Normal heart sounds. No murmur.  Pulmonary:     Effort: Pulmonary effort is normal. No respiratory distress.  Abdominal:     General: Abdomen is flat. A surgical scar is present. There is no distension.     Tenderness: There is no abdominal tenderness. There is no guarding or rebound.       Comments: Midline laparotomy wound is well healing, there is blanching erythema surrounding the inferior portion of the staples, there is a small area of dehiscence inferior to the umbilicus, there is expressible serosanguinous output and clotted blood, no purulence or crepitus, this area does track superiorly and  inferiorly  Genitourinary:    Comments: Deferred, foley in place Musculoskeletal:     Right lower leg: No edema.     Left lower leg: No edema.  Skin:    General: Skin is warm and dry.     Findings: Erythema (Surrounding midline abdominal incision) present.  Neurological:     General: No focal deficit present.     Mental Status: He is alert and oriented to person, place, and time.  Psychiatric:        Mood and Affect: Mood normal.        Behavior: Behavior normal.      Labs:  CBC  Latest Ref Rng & Units 02/12/2020 10/14/2019  WBC 4.0 - 10.5 K/uL 12.2(H) 6.3  Hemoglobin 13.0 - 17.0 g/dL 12.6(L) 14.9  Hematocrit 39.0 - 52.0 % 37.2(L) 45.9  Platelets 150 - 400 K/uL 142(L) 208   CMP Latest Ref Rng & Units 02/13/2020 02/12/2020 10/14/2019  Glucose 70 - 99 mg/dL 94 143(H) 127(H)  BUN 8 - 23 mg/dL 16 19 16   Creatinine 0.61 - 1.24 mg/dL 1.06 1.25(H) 0.98  Sodium 135 - 145 mmol/L 142 143 143  Potassium 3.5 - 5.1 mmol/L 4.0 4.3 4.2  Chloride 98 - 111 mmol/L 112(H) 112(H) 103  CO2 22 - 32 mmol/L 25 24 21   Calcium 8.9 - 10.3 mg/dL 8.2(L) 8.5(L) 9.7  Total Protein 6.0 - 8.5 g/dL - - 6.8  Total Bilirubin 0.0 - 1.2 mg/dL - - 0.4  Alkaline Phos 39 - 117 IU/L - - 105  AST 0 - 40 IU/L - - 16  ALT 0 - 44 IU/L - - 27    Imaging studies:  No pertinent imaging studies   Assessment/Plan:  69 y.o. male with post op wound infection and likely underlying hematoma 8 days s/p sigmoid colectomy for colovesical fistula   - 1/2 staples removed, wound packed with 4x4 gauze and covered with gauze and tape  - Reviewed wound care instructions with him  - Recommend Augmentin BID x7 days  - Okay to complete cystogram  - Follow up with general surgery as scheduled   All of the above findings and recommendations were discussed with the patient and the EDP, and all of patient's questions were answered to his expressed satisfaction.  -- Edison Simon, PA-C [facility] 02/20/2020, 1:21  PM [phone] M-F: 7am - 4pm

## 2020-02-20 NOTE — Progress Notes (Signed)
Catheter Removal  Patient is present today for a catheter removal.  49ml of water was drained from the balloon. A 16FR foley cath was removed from the bladder no complications were noted . Patient tolerated well.  Performed by: Debroah Loop, PA-C

## 2020-02-20 NOTE — ED Provider Notes (Signed)
[facility] Emergency Department Provider Note ____________________________________________   First MD Initiated Contact with Patient 02/20/20 1137     (approximate)  I have reviewed the triage vital signs and the nursing notes.   HISTORY  Chief Complaint No chief complaint on file.  History of present illness obtained via in-person Spanish interpreter  HPI Russell [name] is a 69 y.o. male with PMH as noted below status post abdominal surgery on 3/23 who presents with bleeding from the wound, acute onset this morning when he was at radiology for a cystogram, and now resolved.  He reports that it was bright red blood with no urine or other material.  He denies any blood from his Foley catheter.  He has had no pain and denies any dizziness or weakness.  He was sent to the ED for further evaluation.  Past Medical History:  Diagnosis Date  . Arthritis   . Back pain   . 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 Hyperlipidemia   . Hypertension     Patient Active Problem List   Diagnosis Date Noted  . 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  . Carpal joint sprain 09/11/2019  . Cervical spondylosis without myelopathy 09/11/2019  . DDD (degenerative disc disease), cervical 09/11/2019  . Lumbosacral stenosis 08/30/2019  . Degeneration of lumbosacral intervertebral disc 08/30/2019  . 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 Hypotension 02/10/2018  . Acute blood loss anemia 02/10/2018  . S/P spinal fusion 01/17/2018  . Other specified abnormal immunological findings in serum 11/27/2017  . Postoperative or surgical complication [id number]  . 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[place]) 11/25/2017  . Fluid collection at surgical site 11/25/2017  . Delirium due to another medical condition 10/05/2017  . BMI 26.0-26.9,adult 06/26/2017  . GERD (gastroesophageal reflux disease) 05/09/2017  . Chronic cough 11/21/2016  . Heterotopic ossification 03/01/2016  . 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  Pyuria 11/27/2015  . Oropharyngeal dysphagia 11/27/2015  . Macrocytic anemia 11/27/2015  . History of pulmonary embolism 11/27/2015  . History of malnutrition 11/27/2015  . Acute encephalopathy 11/27/2015  . 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 Urinary retention 11/27/2015  . Personal history of other malignant neoplasm of skin 07/17/2015  . Hyperlipidemia, mixed 05/05/2015  . Excessive cerumen in both ear canals 04/23/2015  . Skin lesions, generalized 04/23/2015  . Need for vaccination 04/23/2015  . Increased frequency of urination 04/23/2015  . Helicobacter pylori (H. pylori) infection 02/11/2014  . Dyspepsia 02/05/2014  . Neck pain 10/11/2013  . Pain in joint, pelvic region and thigh 10/11/2013  . Low back pain 10/11/2013    Past Surgical History:  Procedure Laterality Date  . BACK SURGERY    . COLONOSCOPY WITH PROPOFOL N/A 11/05/2019   Procedure: COLONOSCOPY WITH PROPOFOL;  Surgeon: [name], MD;  Location: [facility] ENDOSCOPY;  Service: Gastroenterology;  Laterality: N/A;  . PARTIAL COLECTOMY N/A 02/11/2020   Procedure: PARTIAL COLECTOMY -- sigmoid;  Surgeon: Olean Ree, MD;  Location: [facility] ORS;  Service: General;  Laterality: N/A;  . SPINE SURGERY    . TAKE DOWN OF INTESTINAL FISTULA N/A 02/11/2020   Procedure: TAKE DOWN OF INTESTINAL FISTULA -- colovesical fistula;  Surgeon: Olean Ree, MD;  Location: [facility] ORS;  Service: General;  Laterality: N/A;  . TRACHEAL SURGERY      Prior to Admission medications   Medication Sig Start Date End Date Taking? Authorizing Provider  amoxicillin-clavulanate (AUGMENTIN) 875-125 MG tablet Take 1 tablet by mouth 2 (two) times daily for 7 days. 02/20/20 02/27/20  Arta Silence,  MD  atorvastatin (LIPITOR) 20 MG tablet Take 20 mg by mouth daily. 08/20/19   [provider]  doxycycline (VIBRAMYCIN) 100 MG capsule Take 100 mg by mouth daily. 01/03/20   [provider]  ibuprofen (ADVIL) 800 MG tablet Take 1 tablet  (800 mg total) by mouth every 8 (eight) hours as needed. 02/14/20   Tylene Fantasia, PA-C  oxyCODONE (OXY IR/ROXICODONE) 5 MG immediate release tablet Take 1 tablet (5 mg total) by mouth every 4 (four) hours as needed for moderate pain, severe pain or breakthrough pain. 02/14/20   Tylene Fantasia, PA-C    Allergies Patient has no known allergies.  Family History  Problem Relation Age of Onset  . Prostate cancer Neg Hx   . Bladder Cancer Neg Hx   . Kidney cancer Neg Hx     Social History Social History   Tobacco Use  . Smoking status: Never Smoker  . Smokeless tobacco: Never Used  Substance Use Topics  . Alcohol use: No  . Drug use: No    Review of Systems  Constitutional: No fever. Eyes: No redness. ENT: No sore throat. Cardiovascular: Denies chest pain. Respiratory: Denies shortness of breath. Gastrointestinal: No vomiting. Genitourinary: Negative for hematuria. Musculoskeletal: Negative for back pain. Skin: Negative for rash. Neurological: Negative for headache.   ____________________________________________   PHYSICAL EXAM:  VITAL SIGNS: ED Triage Vitals  Enc Vitals Group     BP 02/20/20 1139 121/84     Pulse Rate 02/20/20 1139 84     Resp 02/20/20 1139 17     Temp 02/20/20 1145 97.7 F (36.5 C)     Temp Source 02/20/20 1145 Oral     SpO2 02/20/20 1139 99 %     Weight 02/20/20 1146 200 lb (90.7 kg)     Height 02/20/20 1146 5\' 11"  (1.803 m)     Head Circumference --      Peak Flow --      Pain Score 02/20/20 1146 3     Pain Loc --      Pain Edu? --      Excl. in [place]? --     Constitutional: Alert and oriented. Well appearing and in no acute distress. Eyes: Conjunctivae are normal.  Head: Atraumatic. Nose: No congestion/rhinnorhea. Mouth/Throat: Mucous membranes are moist.   Neck: Normal range of motion.  Cardiovascular: Normal rate, regular rhythm.  Good peripheral circulation. Respiratory: Normal respiratory effort.  No retractions.  Gastrointestinal: Soft and nontender. No distention.  Lower abdominal midline scar clean, dry, with no tenderness and no active bleeding.  Approximately 2-3 cm open area inferior to the umbilicus. Genitourinary: No flank tenderness. Musculoskeletal: Extremities warm and well perfused.  Neurologic:  Normal speech and language. No gross focal neurologic deficits are appreciated.  Skin:  Skin is warm and dry. No rash noted. Psychiatric: Mood and affect are normal. Speech and behavior are normal.  ____________________________________________   LABS (all labs ordered are listed, but only abnormal results are displayed)  Labs Reviewed - No data to display ____________________________________________  EKG   ____________________________________________  RADIOLOGY    ____________________________________________   PROCEDURES  Procedure(s) performed: No  Procedures  Critical Care performed: No ____________________________________________   INITIAL IMPRESSION / ASSESSMENT AND PLAN / ED COURSE  Pertinent labs & imaging results that were available during my care of the patient were reviewed by me and considered in my medical decision making (see chart for details).  69 year old male with PMH as noted above and status  post takedown of colovesicular fistula, bladder repair, and sigmoid colectomy (with the sigmoid colectomy on 3/23) presents with bleeding from the wound acutely this morning when he was at a cystogram.  It has now resolved.  He has no associated pain or lightheadedness.  On exam the patient is overall well-appearing and his vital signs are normal.  The abdominal wound was closed with staples and appears intact.  There is a small area where there is residue of recent bleeding and possibly a small open area, but there is no active bleeding, no tenderness, and I cannot express any additional blood palpating the wound.  The dried fluid on the patient's underwear appears more  serosanguineous.  He has no gross blood in the urine.  Overall presentation is consistent with seroma or hematoma.  I have consulted Dr. Hampton Abbot from general surgery to evaluate the patient.  ----------------------------------------- 4:22 PM on 02/20/2020 -----------------------------------------  The patient was evaluated by surgery and I discussed his case with the PA Olean Ree.  The wound was explored and packed.  Surgery recommended discharge with a course of Augmentin and follow-up next week.  They also cleared the patient to go back for the cystogram.  The patient was discharged to go back to cystogram.  Return precautions provided and he expressed understanding.   ____________________________________________   FINAL CLINICAL IMPRESSION(S) / ED DIAGNOSES  Final diagnoses:  Wound dehiscence      NEW MEDICATIONS STARTED DURING THIS VISIT:  Discharge Medication List as of 02/20/2020  1:53 PM    START taking these medications   Details  amoxicillin-clavulanate (AUGMENTIN) 875-125 MG tablet Take 1 tablet by mouth 2 (two) times daily for 7 days., Starting Thu 02/20/2020, Until Thu 02/27/2020, Normal         Note:  This document was prepared using Dragon voice recognition software and may include unintentional dictation errors.   Arta Silence, MD 02/20/20 1623

## 2020-02-20 NOTE — ED Notes (Signed)
AAOx3.  Skin warm and dry.  NAD 

## 2020-02-20 NOTE — ED Triage Notes (Signed)
Pt to ED from scheduled imaging for incision site bleeding at abdomen from fistula. Bleeding from staples near bottom of site as pt started removing his pants for imaging. Site clean, staples remain in place, not currently bleeding but old blood noted at site. Pt alert and calmly laying in bed. States pain 3/10. Denies dizziness, sever pain, nausea, fevers. Interpreter at bedside.

## 2020-02-20 NOTE — ED Notes (Signed)
Lac cart to bedside for consulting staff.

## 2020-02-24 ENCOUNTER — Ambulatory Visit (INDEPENDENT_AMBULATORY_CARE_PROVIDER_SITE_OTHER): Payer: Self-pay | Admitting: Surgery

## 2020-02-24 ENCOUNTER — Ambulatory Visit: Payer: Medicare Other | Admitting: Physician Assistant

## 2020-02-24 ENCOUNTER — Other Ambulatory Visit: Payer: Self-pay

## 2020-02-24 ENCOUNTER — Encounter: Payer: Self-pay | Admitting: Surgery

## 2020-02-24 VITALS — BP 103/72 | HR 93 | Temp 97.5°F | Resp 12 | Ht 71.0 in | Wt 197.4 lb

## 2020-02-24 DIAGNOSIS — Z09 Encounter for follow-up examination after completed treatment for conditions other than malignant neoplasm: Secondary | ICD-10-CM

## 2020-02-24 DIAGNOSIS — N321 Vesicointestinal fistula: Secondary | ICD-10-CM

## 2020-02-24 MED ORDER — OXYCODONE HCL 5 MG PO TABS: 5.0000 mg | ORAL_TABLET | ORAL | 0 refills | Status: DC | PRN

## 2020-02-24 NOTE — Progress Notes (Signed)
02/24/2020  HPI: Russell [name] is a 69 y.o. male s/p sigmoidectomy with colovesical fistula takedown on 3/23.  He presented to the ED on 4/1 with bleeding/drainage from the inferior portion of his midline wound.  There was an open area at the inferior portion where the drainage was coming from, which appeared to be a hematoma.  Half of staples were removed, and the wound was packed with gauze, and dressing instructions were given.  Augmentin was prescribed as a precaution due to some erythema of the incision.  He presents today for follow up.  His foley catheter was removed on 4/1 after cystogram was negative for any leak.  He reports that the redness around the incision is improved.  His wife was not comfortable with doing packing dressing changes and so only superficial gauze dressing was done twice daily.  She reports a significant amount of serosanguinous fluid drainage on the gauze.  Denies any worsening pain but still reports having discomfort at the midline and is requesting a refill of his oxycodone.  Vital signs: BP 103/72   Pulse 93   Temp (!) 97.5 F (36.4 C)   Resp 12   Ht 5\' 11"  (1.803 m)   Wt 197 lb 6.4 oz (89.5 kg)   SpO2 96%   BMI 27.53 kg/m    Physical Exam: Constitutional:  No acute distress Abdomen:  Soft, non-distended, with appropriate soreness to palpation.  Midline incision is healing well, with exception for a 2 cm portion in the lower half of the incision.  The base of that wound is healthy, without any purulent fluid.  Dabbing the wound reveals serosanguinous fluid, residual fluid from seroma/hematoma.  The wound was packed with two pieces of 4x4 gauze, tracking superiorly to below the umbilicus, and inferiorly to the bottom of the incision.  The remaining staples were removed and changed for steri strips.  Assessment/Plan: This is a 69 y.o. male s/p open sigmoidectomy and colovesical fistula takedown  --Remaining staples removed and changed to steri  strips --Continue wound packing dressing changes twice daily.  Instructed the patient's wife on how to do the dressing changes and she's comfortable with the instructions.   --continue antibiotics until course completed. --Will refill oxycodone one more time. --Follow up in two weeks to assess progress.   Melvyn Neth, [facility]

## 2020-02-24 NOTE — Patient Instructions (Addendum)
You will need to clean the area and pack a piece of gauze, then cover the area with a piece of gauze. You will noticed after a few days that you will not be able to put no more gauze in and that is normal just place a gauze over the area.   Deber limpiar el rea y Wellsite geologist un trozo de gasa, luego cubrir el rea con un trozo de gasa. Notars despus de [employer] no podrs poner ms gasa y que es normal solo coloca una gasa sobre el rea.  Dr Hampton Abbot removed the staples. He placed steri-strips. These will begin to fall off within 7-10 days.  El Dr. Hampton Abbot quit las grapas. Coloc steri-strips. Estos comenzarn a Designer, jewellery de los 7 a 10 das.  Pick up your prescription at your local pharmacy. Recoja su receta en su farmacia local.  please see your follow up visit below. por favor vea su visita de seguimiento a continuacin.  Colectoma laparoscpica: cuidados posteriores Laparoscopic Colectomy, Care After Lea esta informacin sobre cmo cuidarse despus del procedimiento. Su mdico tambin podr darle indicaciones ms especficas. Comunquese con su mdico si tiene problemas o preguntas. Qu puedo esperar despus del procedimiento? Despus del procedimiento, es normal tener los siguientes sntomas:  Dolor en el abdomen, especialmente en el lugar de las incisiones. Le darn medicamentos para Financial controller.  Cansancio. Es Ardelia Mems etapa normal del proceso de recuperacin. Su nivel de Research officer, trade union a la normalidad en las prximas semanas.  Cambios en las deposiciones, por ejemplo, estreimiento o necesidad de defecar ms seguido. Hable con el mdico sobre cmo manejar esta situacin. Siga estas indicaciones en su casa: Medicamentos  [employer] de venta libre y los recetados solamente como se lo haya indicado el mdico.  No conduzca ni use maquinaria pesada mientras toma analgsicos recetados.  No consuma alcohol mientras toma analgsicos recetados.  Si le recetaron un  antibitico, tmelo como se lo haya indicado el mdico. No deje de usar el antibitico aunque comience a Sports administrator. Cuidados de la incisin   Siga las indicaciones del mdico acerca del cuidado de los lugares de las incisiones. Haga lo siguiente: ? Ashley y secas. ? Lvese las manos con agua y [place] antes y despus de Careers information officer en los lugares de las incisiones y [place] vendas (vendaje). Use desinfectante para manos si no dispone de [place] y [place]. ? Cambie los vendajes como se lo haya indicado el mdico. ? No retire los puntos (suturas), la goma para cerrar la piel o las tiras [place]. Es posible que estos cierres cutneos Animal nutritionist en la piel durante 2semanas o ms. Si los bordes de las tiras adhesivas empiezan a despegarse y Therapist, sports, puede recortar los que estn sueltos. No retire las tiras [facility] por completo a menos que el mdico se lo indique.  No use ropa [employer]. La ropa ajustada puede raspar e irritar los lugares de las incisiones, las cuales podran abrirse.  No tome baos de inmersin, no nade ni use el jacuzzi hasta que el mdico lo autorice. Pregntele al mdico si puede ducharse. Thurston Pounds solo le permitan tomar baos de [place].  [place] zona de la incisin para detectar signos de infeccin. Est atento a los siguientes signos: ? Aumento del enrojecimiento, de la hinchazn o del dolor. ? Mayor presencia de lquido o [place]. ? Calor. ? Pus o mal olor. Actividad  No levante nada que  pese ms de 10libras (4,5kg) durante 2semanas o hasta que el mdico lo autorice.  Puede retomar sus actividades normales como se lo haya indicado el mdico. Pregntele al mdico qu actividades son seguras para usted.  Durante el da descanse cuanto sea necesario. Comida y bebida  Siga las indicaciones del mdico respecto de lo que puede comer despus de la [place].  A fin de prevenir o tratar  el estreimiento mientras toma analgsicos recetados, el mdico puede recomendarle lo siguiente: ? Beba suficiente lquido como para mantener la orina clara o de color amarillo plido. ? Tome medicamentos recetados o de venta [place]. ? Consuma alimentos ricos en fibra, como frutas y verduras frescas, cereales integrales y frijoles. ? Limite el consumo de alimentos ricos en grasa y azcares procesados, como alimentos fritos o dulces. Instrucciones generales  Pregntele al mdico para cundo debe programar una cita para que le retiren los puntos o las grapas.  Concurra a todas las visitas de seguimiento como se lo haya indicado el mdico. Esto es importante. Comunquese con un mdico si:  Aumentan el enrojecimiento, la hinchazn o el dolor alrededor [employer].  Las incisiones supuran ms [place].  Las incisiones estn calientes al tacto.  Tiene secrecin de pus o percibe un olor ftido que emanan de las incisiones o del vendaje.  Tiene fiebre.  Se le abre alguna incisin (los bordes se separan) luego de que le retiran las [place] grapas. Solicite ayuda de inmediato si:  Presenta una erupcin cutnea.  Tiene dolor en el pecho o dificultad para respirar.  Presenta dolor o hinchazn en las piernas.  Se siente mareado o se desmaya.  Tiene el abdomen hinchado (se ledistiende).  Tiene nuseas o vmitos.  Observa sangre en la materia fecal (heces). Esta informacin no tiene Marine scientist el consejo del mdico. Asegrese de hacerle al mdico cualquier pregunta que tenga. Document Revised: 06/16/2017 Document Reviewed: 04/12/2016 Elsevier Patient Education  2020 [employer].

## 2020-02-26 ENCOUNTER — Encounter: Payer: Self-pay | Admitting: Surgery

## 2020-03-10 ENCOUNTER — Other Ambulatory Visit: Payer: Self-pay

## 2020-03-10 ENCOUNTER — Ambulatory Visit (INDEPENDENT_AMBULATORY_CARE_PROVIDER_SITE_OTHER): Payer: Medicare Other | Admitting: Physician Assistant

## 2020-03-10 ENCOUNTER — Encounter: Payer: Self-pay | Admitting: Physician Assistant

## 2020-03-10 VITALS — BP 111/73 | HR 96 | Ht 70.0 in | Wt 197.0 lb

## 2020-03-10 DIAGNOSIS — R310 Gross hematuria: Secondary | ICD-10-CM

## 2020-03-10 LAB — MICROSCOPIC EXAMINATION

## 2020-03-10 LAB — URINALYSIS, COMPLETE
Bilirubin, UA: NEGATIVE
Glucose, UA: NEGATIVE
Ketones, UA: NEGATIVE
Leukocytes,UA: NEGATIVE
Nitrite, UA: NEGATIVE
RBC, UA: NEGATIVE
Specific Gravity, UA: 1.02 (ref 1.005–1.030)
Urobilinogen, Ur: 1 mg/dL (ref 0.2–1.0)
pH, UA: 5.5 (ref 5.0–7.5)

## 2020-03-10 LAB — BLADDER SCAN AMB NON-IMAGING: Scan Result: 0

## 2020-03-10 NOTE — Progress Notes (Signed)
03/10/2020 5:37 PM   Russell [name] 14-Mar-1951 [id number]  CC: Gross hematuria  HPI: Russell [name] is a 69 y.o. male who recently underwent open sigmoidectomy and takedown of colovesical fistula, bladder repair, splenic flexure mobilization, and rigid sigmoidoscopy for management of colovesical fistula.  Foley catheter removed in clinic on 02/20/2020 following negative cystogram.  Today, patient reports an isolated episode of gross hematuria that occurred 3 days ago.  He states his urine was pale red and runny in color without clots.  He denies dysuria, urgency, and frequency.  Additionally, he reports profuse watery diarrhea this weekend as well as right-sided abdominal pain and tenderness.  He is scheduled to follow-up with Dr. Hampton Abbot for his routine follow-up appointment tomorrow.  In-office UA today positive for trace protein; urine microscopy pan negative. PVR 14mL.  PMH: Past Medical History:  Diagnosis Date  . Arthritis   . Back pain   . 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 Hyperlipidemia   . Hypertension     Surgical History: Past Surgical History:  Procedure Laterality Date  . BACK SURGERY    . COLONOSCOPY WITH PROPOFOL N/A 11/05/2019   Procedure: COLONOSCOPY WITH PROPOFOL;  Surgeon: [name], MD;  Location: [facility] ENDOSCOPY;  Service: Gastroenterology;  Laterality: N/A;  . PARTIAL COLECTOMY N/A 02/11/2020   Procedure: PARTIAL COLECTOMY -- sigmoid;  Surgeon: Olean Ree, MD;  Location: [facility] ORS;  Service: General;  Laterality: N/A;  . SPINE SURGERY    . TAKE DOWN OF INTESTINAL FISTULA N/A 02/11/2020   Procedure: TAKE DOWN OF INTESTINAL FISTULA -- colovesical fistula;  Surgeon: Olean Ree, MD;  Location: [facility] ORS;  Service: General;  Laterality: N/A;  . TRACHEAL SURGERY      Home Medications:  Allergies as of 03/10/2020   No Known Allergies     Medication List       Accurate as of March 10, 2020  5:37 PM. If you have any questions, ask your nurse or doctor.         atorvastatin 20 MG tablet Commonly known as: LIPITOR Take 20 mg by mouth daily.   doxycycline 100 MG capsule Commonly known as: VIBRAMYCIN Take 100 mg by mouth daily.   ibuprofen 800 MG tablet Commonly known as: ADVIL Take 1 tablet (800 mg total) by mouth every 8 (eight) hours as needed.   oxyCODONE 5 MG immediate release tablet Commonly known as: Oxy IR/ROXICODONE Take 1 tablet (5 mg total) by mouth every 4 (four) hours as needed for moderate pain, severe pain or breakthrough pain.       Allergies:  No Known Allergies  Family History: Family History  Problem Relation Age of Onset  . Prostate cancer Neg Hx   . Bladder Cancer Neg Hx   . Kidney cancer Neg Hx     Social History:   reports that he has never smoked. He has never used smokeless tobacco. He reports that he does not drink alcohol or use drugs.  Physical Exam: BP 111/73   Pulse 96   Ht 5\' 10"  (1.778 m)   Wt 197 lb (89.4 kg)   BMI 28.27 kg/m   Constitutional:  Alert and oriented, no acute distress, nontoxic appearing HEENT: [place], AT Cardiovascular: No clubbing, cyanosis, or edema Respiratory: Normal respiratory effort, no increased work of breathing GI: Mild abdominal tenderness on deep palpation right of the umbilicus.  No rebound or guarding. Skin: No rashes, bruises or suspicious lesions Neurologic: Grossly intact, no focal deficits, moving all 4 extremities Psychiatric: Normal mood  and affect  Laboratory Data: Results for orders placed or performed in visit on 03/10/20  Microscopic Examination   URINE  Result Value Ref Range   WBC, UA 0-5 0 - 5 /hpf   RBC 0-2 0 - 2 /hpf   Epithelial Cells (non renal) 0-10 0 - 10 /hpf   Casts Present (A) None seen /lpf   Cast Type Hyaline casts N/A   Mucus, UA Present (A) Not Estab.   Bacteria, UA Few None seen/Few  Urinalysis, Complete  Result Value Ref Range   Specific Gravity, UA 1.020 1.005 - 1.030   pH, UA 5.5 5.0 - 7.5   Color, UA Orange Yellow    Appearance Ur Hazy (A) Clear   Leukocytes,UA Negative Negative   Protein,UA Trace (A) Negative/Trace   Glucose, UA Negative Negative   Ketones, UA Negative Negative   RBC, UA Negative Negative   Bilirubin, UA Negative Negative   Urobilinogen, Ur 1.0 0.2 - 1.0 mg/dL   Nitrite, UA Negative Negative   Microscopic Examination See below:   Bladder Scan (Post Void Residual) in office  Result Value Ref Range   Scan Result 0    Assessment & Plan:   1. Gross hematuria 69 year old male with a recent history of colovesical fistula repair presents with an episode of gross hematuria 3 days ago not associated with pain.  No MH on UA today.  I suspect this may be consistent with postoperative healing in the setting of his recent urologic surgery.  Counseled him to follow-up with [place] if it recurs.  Additionally, I counseled him to discuss his abdominal tenderness and diarrhea with Dr. Hampton Abbot tomorrow.  He expressed understanding. - Urinalysis, Complete - Bladder Scan (Post Void Residual) in office   Return if symptoms worsen or fail to improve.  Debroah Loop, PA-C  [facility] [address] [place], [place] [address] [phone]

## 2020-03-11 ENCOUNTER — Other Ambulatory Visit: Payer: Self-pay

## 2020-03-11 ENCOUNTER — Ambulatory Visit: Payer: Medicare Other | Admitting: Gastroenterology

## 2020-03-11 ENCOUNTER — Ambulatory Visit (INDEPENDENT_AMBULATORY_CARE_PROVIDER_SITE_OTHER): Payer: Self-pay | Admitting: Surgery

## 2020-03-11 ENCOUNTER — Encounter: Payer: Self-pay | Admitting: Surgery

## 2020-03-11 VITALS — BP 113/79 | HR 102 | Temp 97.5°F | Resp 14 | Ht 67.0 in | Wt 197.0 lb

## 2020-03-11 DIAGNOSIS — N321 Vesicointestinal fistula: Secondary | ICD-10-CM

## 2020-03-11 DIAGNOSIS — Z09 Encounter for follow-up examination after completed treatment for conditions other than malignant neoplasm: Secondary | ICD-10-CM

## 2020-03-11 NOTE — Patient Instructions (Addendum)
Follow up as needed. Please call the office if you have any questions or concerns.   Realice un seguimiento segn sea necesario. Llame a la oficina si tiene alguna pregunta o inquietud.  GENERAL POST-OPERATIVE PATIENT INSTRUCTIONS   WOUND CARE INSTRUCTIONS:  Keep a dry clean dressing on the wound if there is drainage. The initial bandage may be removed after 24 hours.  Once the wound has quit draining you may leave it open to air.  If clothing rubs against the wound or causes irritation and the wound is not draining you may cover it with a dry dressing during the daytime.  Try to keep the wound dry and avoid ointments on the wound unless directed to do so.  If the wound becomes bright red and painful or starts to drain infected material that is not clear, please contact your physician immediately.  If the wound is mildly pink and has a thick firm ridge underneath it, this is normal, and is referred to as a healing ridge.  This will resolve over the next 4-6 weeks.  BATHING: You may shower if you have been informed of this by your surgeon. However, Please do not submerge in a tub, hot tub, or pool until incisions are completely sealed or have been told by your surgeon that you may do so.  DIET:  You may eat any foods that you can tolerate.  It is a good idea to eat a high fiber diet and take in plenty of fluids to prevent constipation.  If you do become constipated you may want to take a mild laxative or take ducolax tablets on a daily basis until your bowel habits are regular.  Constipation can be very uncomfortable, along with straining, after recent surgery.  ACTIVITY:  You are encouraged to cough and deep breath or use your incentive spirometer if you were given one, every 15-30 minutes when awake.  This will help prevent respiratory complications and low grade fevers post-operatively if you had a general anesthetic.  You may want to hug a pillow when coughing and sneezing to add additional support  to the surgical area, if you had abdominal or chest surgery, which will decrease pain during these times.  You are encouraged to walk and engage in light activity for the next two weeks.  You should not lift more than 20 pounds, until 03/10/2020 as it could put you at increased risk for complications.  Twenty pounds is roughly equivalent to a plastic bag of groceries. At that time- Listen to your body when lifting, if you have pain when lifting, stop and then try again in a few days. Soreness after doing exercises or activities of daily living is normal as you get back in to your normal routine.  MEDICATIONS:  Try to take narcotic medications and anti-inflammatory medications, such as tylenol, ibuprofen, naprosyn, etc., with food.  This will minimize stomach upset from the medication.  Should you develop nausea and vomiting from the pain medication, or develop a rash, please discontinue the medication and contact your physician.  You should not drive, make important decisions, or operate machinery when taking narcotic pain medication.  SUNBLOCK Use sun block to incision area over the next year if this area will be exposed to sun. This helps decrease scarring and will allow you avoid a permanent darkened area over your incision.  QUESTIONS:  Please feel free to call our office if you have any questions, and we will be glad to assist you.

## 2020-03-11 NOTE — Progress Notes (Signed)
03/11/2020  HPI: Russell [name] is a 69 y.o. male s/p sigmoidectomy with colovesical fistula takedown and repair on 02/11/20.  He presents for follow up.  The lower portion of his midline incision had opened up and was getting packed daily.  His foley was removed by Urology on 4/1.  He had issues with hematuria and saw Urology yesterday.  However, U/A was negative for UTI and RBC was normal.  He also had issues with diarrhea over the weekend and his wife thinks it's because of a bad steak the patient tried to cook at home.  With that's he's had some lower left and lower right abdominal discomfort.  The diarrhea has now resolved.  From the surgical standpoint, he's actually been improving.  The midline wound portion that had opened is healing very well, and the opening is small and only doing superficial dressing changes.  He's tolerating a diet, having bowel function, improving in his mobility.  Vital signs: BP 113/79   Pulse (!) 102   Temp (!) 97.5 F (36.4 C)   Resp 14   Ht 5\' 7"  (1.702 m)   Wt 197 lb (89.4 kg)   SpO2 93%   BMI 30.85 kg/m    Physical Exam: Constitutional:  No acute distress Abdomen:  Soft, non-distended, with only some mild discomfort at the right lower quadrant.  No peritonitis or other concerning finding.  The midline incision only has a 1 cm opening with minimal undermining superiorly and inferiorly.  No packing necessary and dressed with superficial dressing.  Assessment/Plan: This is a 69 y.o. male s/p sigmoidectomy with takedown of colovesical fistula and bladder repair.  --I think the diarrhea and discomfort are likely from gastroenteritis from the bad steak he had.  Abdomen exam is otherwise benign and his diarrhea has resolved.  No need for c-diff testing at this point.  Instructed to monitor for any worsening symptoms again and to let us know. --Continue dry gauze dressing changes to the remaining open portion of the midline wound until fully healed.  Offered  silver nitrate treatment to the wound today, but patient declined. --Per Urology notes, hematuria could be due to postoperative healing given his recent surgery.  He will follow up with Urology as needed if it recurs. --Follow up with [place] as needed as well.   Melvyn Neth, [facility]

## 2020-04-01 ENCOUNTER — Ambulatory Visit (INDEPENDENT_AMBULATORY_CARE_PROVIDER_SITE_OTHER): Payer: Self-pay | Admitting: Surgery

## 2020-04-01 ENCOUNTER — Other Ambulatory Visit: Payer: Self-pay

## 2020-04-01 ENCOUNTER — Encounter: Payer: Self-pay | Admitting: Surgery

## 2020-04-01 VITALS — BP 113/78 | HR 87 | Temp 95.7°F | Ht 70.0 in | Wt 203.4 lb

## 2020-04-01 DIAGNOSIS — Z09 Encounter for follow-up examination after completed treatment for conditions other than malignant neoplasm: Secondary | ICD-10-CM

## 2020-04-01 DIAGNOSIS — R1030 Lower abdominal pain, unspecified: Secondary | ICD-10-CM

## 2020-04-01 NOTE — Patient Instructions (Addendum)
Dr.Piscoya recommends patient to try over the counter Prilosec (Omeprazole) once a day. Follow-up with our office as needed. Please call and ask to speak with a nurse if you develop questions or concerns.   Omeprazole capsules (sprinkle caps) - Rx O que  este medicamento? O OMEPRAZOL impede a produo de cido no estmago.  usado para tratar a doena do refluxo gastroesofgico (DRGE), alguns tipos de lceras, a presena de algumas bactrias no estmago, inflamaes do esfago e a sndrome de [place]linger-Ellison. Tambm  usado para tratar outras doenas que provocam excesso de cido no estmago. Este medicamento pode ser usado para outros propsitos; em caso de dvidas, pergunte ao seu profissional de sade ou farmacutico. NOMES DE MARCAS COMUNS: Prilosec O que devo dizer a meu profissional de sade antes de tomar este medicamento? Precisam saber se voc tem algum dos seguintes problemas ou estados de sade:  doenas hepticas  nveis baixos de magnsio no sangue  lpus  reao estranha ou alergia ao omeprazol  reao estranha ou alergia a outros medicamentos  reao Community education officer ou alergia a alimentos, corantes ou conservantes  est grvida ou tentando engravidar  est [employer] devo usar este medicamento? Tome este medicamento por via oral com um copo d'gua. Siga as instrues na embalagem ou na bula. No parta, esmague ou [employer]. Engula inteiro. Voc pode abrir as cpsulas e polvilhar seu contedo em 1 colher de sopa de pur de ma. Engula o medicamento e o pur de ma imediatamente. No mastigue o medicamento e o pur de ma. [employer] medicamento antes de uma refeio. Tome este medicamento em intervalos regulares. No tome este medicamento com frequncia maior do que a indicada. No pare de usar [employer] a menos que seu mdico [place]. O farmacutico lhe dar um folheto informativo especial a cada compra do medicamento. No se esquea de ler  atentamente essas informaes todas as vezes. Fale com seu pediatra a respeito do uso deste medicamento em crianas. Embora este medicamento possa ser receitado para crianas a [employer] 2 anos de idade para certas condies, algumas precaues so necessrias. Superdosagem: Se achar que tomou uma superdosagem deste medicamento, entre em contato imediatamente com o [facility] [place] de Intoxicaes ou v a [employer]. OBSERVAO: Este medicamento  s para voc. No compartilhe este medicamento com outras pessoas. E se eu deixar de tomar uma dose? Se perder uma dose, tome-a assim que possvel. Se j estiver quase na hora da sua prxima dose, tome somente essa dose. No tome o remdio em dobro, nem tome uma dose adicional. O que pode interagir com este medicamento? No tome este medicamento com nenhum dos seguintes:  atazanavir  clopidogrel  nelfinavir  rilpivirina Este medicamento tambm pode interagir com os seguintes remdios:  antifngicos, como itraconazol, cetoconazol e voriconazol  alguns medicamentos antivirais contra a infeco pelo HIV ou hepatite  alguns medicamentos que tratam ou previnem a trombose, como a varfarina  cilostazol  citalopram  ciclosporina  dasatinibe  digoxina  dissulfiram  diurticos  erlotinibe  suplementos de ferro  alguns medicamentos para ansiedade e pnico e para dormir, como diazepam  alguns medicamentos para crises convulsivas, como carbamazepina, fenobarbital, fenitona  metotrexato  micofenolato de mofetila  nilotinibe  rifampicina  erva-de-so-joo  tacrolimo  vitamina B12 Esta lista pode no descrever todas as interaes possveis. D ao seu profissional de sade uma lista de todos os medicamentos, ervas medicinais, remdios de venda livre, ou suplementos alimentares que voc [place]. Diga tambm se voc fuma, bebe,  ou [place] drogas ilcitas. Alguns destes podem interagir com o seu medicamento. Ao que devo ficar  atento quando estiver [employer] medicamento? Consulte seu mdico ou profissional de sade para realizar um acompanhamento regular Diplomatic Services operational officer. Avise seu mdico ou profissional de sade se os seus sintomas no melhorarem ou se piorarem. Voc precisar fazer exames de sangue importantes enquanto estiver [employer]. Este medicamento pode causar uma diminuio nos nveis de vitamina B12. Voc deve consumir vitamina B12 em quantidade suficiente enquanto estiver [employer]. Converse com seu mdico ou profissional de sade a respeito dos alimentos que consome e das vitaminas que toma. Que efeitos colaterais posso sentir aps usar este medicamento? Efeitos colaterais que devem ser informados ao seu mdico ou profissional de sade o mais rpido possvel:  reaes alrgicas, como erupo na pele, coceira, urticria, ou inchao do rosto, dos lbios ou da lngua  dor nos ossos  dificuldade para respirar  febre ou dor de garganta  dores nas articulaes  erupo cutnea na bochecha ou nos braos que piora sob o sol  vermelhido, bolhas, descamao ou afrouxamento da pele, inclusive dentro da boca  diarreia grave  sinais e sintomas de leso renal, tais como dificuldade para urinar ou alteraes na quantidade de urina  sinais e sintomas de uma deficincia de magnsio, tais como cibras; dores musculares; fraqueza muscular; tremores; convulses; ou batimentos cardacos rpidos e irregulares  plipos estomacais  sangramentos ou hematomas fora do comum Efeitos colaterais que normalmente no precisam de cuidados mdicos (avise ao seu mdico ou profissional de sade se persistirem ou forem incmodos):  diarreia  boca seca  gases  dor de cabea  enjoo  dor de estmago Esta lista pode no descrever todos os efeitos colaterais possveis. Para mais orientaes sobre efeitos colaterais, consulte o seu mdico. Voc pode relatar a ocorrncia de efeitos colaterais   FDA pelo telefone [phone]. Onde devo guardar meu medicamento? Gailen Shelter fora do [employer]. Conservar em temperatura ambiente, entre 15 e 30 degreesC [phone] e 86 degreesF). [place]. Descartar qualquer medicamento no utilizado aps a data de validade impressa no rtulo ou embalagem. OBSERVAO: Este folheto  um resumo. Pode no cobrir todas as informaes possveis. Se tiver dvidas a respeito deste medicamento, fale com seu mdico, farmacutico ou profissional de sade.  2020 Elsevier/Gold Standard (2018-10-15 00:00:00)

## 2020-04-01 NOTE — Progress Notes (Signed)
04/01/2020  HPI: Russell [name] is a 69 y.o. male s/p sigmoidectomy with colovesical fistula takedown on 3/23.  He presents today for follow up.  Was last seen on 4/21.  Since then, his incision is fully healed, denies any further urinary issues, and has normal bowel function.  He does report issues with acid reflux and indigestion, as well as some intermittent diarrhea depending on what type of food he eats.  He also mentions intermittent lower abdominal discomfort, which can be about every other day and last a majority of the day.  He does not take any medication for it.  Vital signs: BP 113/78   Pulse 87   Temp (!) 95.7 F (35.4 C) (Temporal)   Ht 5\' 10"  (1.778 m)   Wt 203 lb 6.4 oz (92.3 kg)   SpO2 96%   BMI 29.18 kg/m    Physical Exam: Constitutional:  No acute distress Abdomen:  Soft, non-distended, non-tender to palpation currently.  Midline incision is well healed, clean, dry, intact.  No evidence of hernia.  Assessment/Plan: This is a 69 y.o. male s/p sigmoidectomy and colovesical fistula takedown.  Discussed with the patient that I do not have any suspicious findings on my exam.  There is no evidence of hernia at the midline incision.  This could potentially be related to scar tissue, and if so this would improve with time.  However, I did offer the patient getting a CT scan in order to fully evaluate.  At this point, he would like to wait to see how the pain improves, and if it doesn't, then he will call us so we can get the CT scan.  I think that is very reasonable and will continue with this plan.   Melvyn Neth, [facility]

## 2020-04-29 ENCOUNTER — Other Ambulatory Visit: Payer: Self-pay

## 2020-04-29 ENCOUNTER — Encounter: Payer: Self-pay | Admitting: Physician Assistant

## 2020-04-29 ENCOUNTER — Other Ambulatory Visit
Admission: RE | Admit: 2020-04-29 | Discharge: 2020-04-29 | Disposition: A | Discharge status: Home / Self Care | Payer: Medicare Other | Source: Ambulatory Visit | Attending: Physician Assistant | Admitting: Physician Assistant

## 2020-04-29 ENCOUNTER — Ambulatory Visit (INDEPENDENT_AMBULATORY_CARE_PROVIDER_SITE_OTHER): Payer: Medicare Other | Admitting: Gastroenterology

## 2020-04-29 ENCOUNTER — Ambulatory Visit (INDEPENDENT_AMBULATORY_CARE_PROVIDER_SITE_OTHER): Payer: Self-pay | Admitting: Physician Assistant

## 2020-04-29 VITALS — BP 111/75 | HR 87 | Temp 98.1°F | Resp 14 | Ht 70.0 in | Wt 200.0 lb

## 2020-04-29 VITALS — BP 115/74 | HR 99 | Temp 97.6°F | Ht 70.0 in | Wt 200.0 lb

## 2020-04-29 DIAGNOSIS — K5909 Other constipation: Secondary | ICD-10-CM | POA: Diagnosis not present

## 2020-04-29 DIAGNOSIS — R1084 Generalized abdominal pain: Secondary | ICD-10-CM | POA: Insufficient documentation

## 2020-04-29 DIAGNOSIS — R109 Unspecified abdominal pain: Secondary | ICD-10-CM | POA: Diagnosis not present

## 2020-04-29 DIAGNOSIS — R140 Abdominal distension (gaseous): Secondary | ICD-10-CM

## 2020-04-29 DIAGNOSIS — Z09 Encounter for follow-up examination after completed treatment for conditions other than malignant neoplasm: Secondary | ICD-10-CM | POA: Insufficient documentation

## 2020-04-29 DIAGNOSIS — N321 Vesicointestinal fistula: Secondary | ICD-10-CM

## 2020-04-29 LAB — CREATININE, SERUM
Creatinine, Ser: 1 mg/dL (ref 0.61–1.24)
GFR calc Af Amer: >60 mL/min (ref >60)
GFR calc non Af Amer: >60 mL/min (ref >60)

## 2020-04-29 LAB — BUN: BUN: 22 mg/dL (ref 8–23)

## 2020-04-29 MED ORDER — OXYCODONE HCL 5 MG PO TABS: 5.0000 mg | ORAL_TABLET | Freq: Four times a day (QID) | ORAL | 0 refills | Status: DC | PRN

## 2020-04-29 MED ORDER — ONDANSETRON HCL 4 MG PO TABS: 4.0000 mg | ORAL_TABLET | Freq: Three times a day (TID) | ORAL | 0 refills | Status: DC | PRN

## 2020-04-29 MED ORDER — DICYCLOMINE HCL 10 MG PO CAPS: 10.0000 mg | ORAL_CAPSULE | Freq: Three times a day (TID) | ORAL | 2 refills | Status: DC

## 2020-04-29 NOTE — Progress Notes (Signed)
[facility] POST-OP OFFICE VISIT  04/29/2020  HPI: Russell [name] is a 69 y.o. 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 Around 15 days ago, he started to notice a bulge at the lower portion of his incision. He is unsure what onset this. He reports that eating tends to briefly exacerbate the pain and typically resolves spontaneously. The bulge comes and goes intermittently but he notices it with straining and coughing. No fever, chills, nausea, emesis, or bowel changes. He continues to pass flatus. He is mobilizing without issue. No other complaints.   Vital signs: BP 111/75   Pulse 87   Temp 98.1 F (36.7 C) (Oral)   Resp 14   Ht 5\' 10"  (1.778 m)   Wt 200 lb (90.7 kg)   SpO2 93%   BMI 28.70 kg/m    Physical Exam: Constitutional: Well appearing male, NAD Abdomen: Soft, non-distended, there is an area of mild tenderness to the lower portion of his midline incision, I do appreciate a protrusion with valsalva maneuvers at the lower portion of his incision concerning for ventral hernia, this is reducible and I do not suspect any incarceration or strangulation.  Skin: Laparotomy incision is well healed.   Assessment/Plan: This is a 69 y.o. 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

## 2020-04-29 NOTE — Patient Instructions (Addendum)
You will need to get lab work done today at the hospital Tendr que hacerse un anlisis de laboratorio hoy en el hospital. entrar por [place]. You may also pick up your prep kit while there today. recoja su kit de preparacin en radiologa mientras est en el hospital hoy  est programado para una tomografa computarizada en [place] el viernes. no coma nada durante 4 horas antes de su tomografa computarizada Friday June 11th at 12:45 pm.  [address] [place], [place] [address]  We will call you with your results.  We will have you follow up here in 2 weeks with Dr Hampton Abbot  le haremos seguimiento aqu en 2 semanas.

## 2020-04-29 NOTE — Progress Notes (Signed)
[name] MD, MRCP(U.K) [address]  [place]  [place], [place] [address]  Main: [phone]  Fax: [phone]   Primary Care Physician: System, Pcp Not In  Primary Gastroenterologist:  Dr. [name]   Follow-up for colovesical fistula  HPI: Russell [name] is a 69 y.o. male  Summary of history :  Initially referred and seen in November 2020 for colovesical fistula.He was seen by Dr. Hampton Abbot on 09/24/2019 for possible colovesical fistula.  Noted some debris in feces and urine for approximately 2 months.  Subsequently a CT urogram performed showed the findings below.  Has undergone cystoscopy.  Last colonoscopy was many years back. CT hematuria work-up scan on 09/06/2019 showed extensive sigmoid diverticulosis with suspected mild acute sigmoid diverticulitis.  No drainable fluid collection or abscess no free air.  Wall thickening with intramural inflammation along the left bladder dome likely reflecting fistulous communication between the sigmoid colon and bladder.   The patient states that for the few months she has noticed feces and gas in his urine.  Denies any abdominal pain.  Denies any change in his bowel movements.  Denies any shape change of his stools.  Last colonoscopy was many years back.  Denies any rectal bleeding.  Presently has no abdominal pain.  He does have urgency and increased frequency of urination.  No other GI symptoms.   Interval history   10/14/2019-04/29/2020  11/05/2019: Colonoscopy: Diverticulosis seen throughout the colon.  5 mm polyp was seen in the ascending colon.  Otherwise the colonoscopy was normal. 11/03/2019: MRI pelvis with and without contrast showed contrast enhancing fistula tract between the inferior aspect of the sigmoid colon and left aspect of the bladder dome 2 cm in length.  Severe sigmoid diverticulosis without evidence of acute diverticulitis.  In March 2021 he underwent sigmoidectomy with takedown of the colovesical fistula.   At that time he had some lower abdominal pain and was advised to get a CT scan of the abdomen.  He is here to see me with an interpreter.  He is doing very well after surgery no complaints apart from a sense of distention and protrusion in the lower part of the abdomen.  In addition he states is not having a soft bowel movement daily.  Also he has a bowel movement it is hard and has not tried any agents.  He is also here to see me for swelling around his left ankle which has been ongoing for many months.  He complains of abdominal pains right after he eats crampy in nature lower abdominal  Current Outpatient Medications  Medication Sig Dispense Refill  . atorvastatin (LIPITOR) 20 MG tablet Take 20 mg by mouth daily.    Marland Kitchen doxycycline (VIBRAMYCIN) 100 MG capsule Take 100 mg by mouth daily.    Marland Kitchen ibuprofen (ADVIL) 800 MG tablet Take 1 tablet (800 mg total) by mouth every 8 (eight) hours as needed. 30 tablet 0   No current facility-administered medications for this visit.    Allergies as of 04/29/2020  . (No Known Allergies)    ROS:  General: Negative for anorexia, weight loss, fever, chills, fatigue, weakness. ENT: Negative for hoarseness, difficulty swallowing , nasal congestion. CV: Negative for chest pain, angina, palpitations, dyspnea on exertion, peripheral edema.  Respiratory: Negative for dyspnea at rest, dyspnea on exertion, cough, sputum, wheezing.  GI: See history of present illness. GU:  Negative for dysuria, hematuria, urinary incontinence, urinary frequency, nocturnal urination.  Endo: Negative for unusual weight change.  Physical Examination:   There were no vitals taken for this visit.  General: Well-nourished, well-developed in no acute distress.  Eyes: No icterus. Conjunctivae pink. Mouth: Oropharyngeal mucosa moist and pink , no lesions erythema or exudate. Lungs: Clear to auscultation bilaterally. Non-labored. Heart: Regular rate and rhythm, no murmurs rubs or  gallops.  Abdomen: Midline lower abdominal scar from prior surgery.  Bowel sounds are normal, nontender, nondistended, no hepatosplenomegaly or masses, no abdominal bruits or hernia , no rebound or guarding.   I made him cough on multiple occasions and felt a distention in the lower part of the abdomen but did not feel like a topical hernia.  Once the coughing was completed the abdominal distention resolved. Extremities: Left ankle on the medial aspect more prominence of blood vessels and features of venous stasis.  No tenderness noted in temperature mild edema.  On the right ankle appears normal. Neuro: Alert and oriented x 3.  Grossly intact. Skin: Warm and dry, no jaundice.   Psych: Alert and cooperative, normal mood and affect.   Imaging Studies: No results found.  Assessment and Plan:   Russell [name] is a 69 y.o. y/o male here to follow-up for colovesical fistula.  Negative colonoscopy in 2020.  MRI of the pelvis demonstrated a fistulous tract between the sigmoid colon and the bladder.  Takedown of the fistulous tract and sigmoid colectomy in March 2023 by Dr. Hampton Abbot.  Today he he is here to see me for constipation.  Suggested to commence on MiraLAX 1 capful daily if does not work we will discuss further options.  Although less likely at times a tight anastomosis may impede flow of stool which we can consider if he does not respond to MiraLAX.  I noted that he had mild swelling of his left ankle ongoing for many months possibly a degree of venous insufficiency.  Advised him to discuss with his primary care physician.  Lower abdominal wall distention on coughing or increasing abdominal pressure.  Does not clearly appear to be a hernia.  He does have some abdominal discomfort.  I suggested him to discuss with Dr. Hampton Abbot.  I have also suggested him to commence on Bentyl 10 mg 3 times daily as needed prior to eating to symptomatically help him with his discomfort.  The discomfort resolves he  probably may not need further intervention.    Dr [name]  MD,MRCP [facility]) Follow up in as needed

## 2020-05-01 ENCOUNTER — Other Ambulatory Visit: Payer: Self-pay

## 2020-05-01 ENCOUNTER — Ambulatory Visit
Admission: RE | Admit: 2020-05-01 | Discharge: 2020-05-01 | Disposition: A | Discharge status: Home / Self Care | Payer: Medicare Other | Source: Ambulatory Visit | Attending: Physician Assistant | Admitting: Physician Assistant

## 2020-05-01 DIAGNOSIS — R1084 Generalized abdominal pain: Secondary | ICD-10-CM | POA: Insufficient documentation

## 2020-05-01 MED ORDER — IOHEXOL 300 MG/ML  SOLN
100.0000 mL | Freq: Once | INTRAMUSCULAR | Status: AC | PRN
  Administered 2020-05-01: 100 mL via INTRAVENOUS

## 2020-05-02 ENCOUNTER — Other Ambulatory Visit: Payer: Self-pay

## 2020-05-02 DIAGNOSIS — Z20822 Contact with and (suspected) exposure to covid-19: Secondary | ICD-10-CM | POA: Diagnosis present

## 2020-05-02 DIAGNOSIS — E782 Mixed hyperlipidemia: Secondary | ICD-10-CM | POA: Diagnosis present

## 2020-05-02 DIAGNOSIS — Z86711 Personal history of pulmonary embolism: Secondary | ICD-10-CM

## 2020-05-02 DIAGNOSIS — Z91013 Allergy to seafood: Secondary | ICD-10-CM

## 2020-05-02 DIAGNOSIS — K5720 Diverticulitis of large intestine with perforation and abscess without bleeding: Principal | ICD-10-CM | POA: Diagnosis present

## 2020-05-02 DIAGNOSIS — K219 Gastro-esophageal reflux disease without esophagitis: Secondary | ICD-10-CM | POA: Diagnosis present

## 2020-05-02 DIAGNOSIS — I10 Essential (primary) hypertension: Secondary | ICD-10-CM | POA: Diagnosis present

## 2020-05-02 DIAGNOSIS — Z79899 Other long term (current) drug therapy: Secondary | ICD-10-CM

## 2020-05-02 LAB — CBC
HCT: 42.3 % (ref 39.0–52.0)
Hemoglobin: 13.7 g/dL (ref 13.0–17.0)
MCH: 29.1 pg (ref 26.0–34.0)
MCHC: 32.4 g/dL (ref 30.0–36.0)
MCV: 90 fL (ref 80.0–100.0)
Platelets: 201 10*3/uL (ref 150–400)
RBC: 4.7 MIL/uL (ref 4.22–5.81)
RDW: 13.7 % (ref 11.5–15.5)
WBC: 9.1 10*3/uL (ref 4.0–10.5)
nRBC: 0 % (ref 0.0–0.2)

## 2020-05-02 LAB — COMPREHENSIVE METABOLIC PANEL
ALT: 15 U/L (ref 0–44)
AST: 11 U/L — ABNORMAL LOW (ref 15–41)
Albumin: 3.6 g/dL (ref 3.5–5.0)
Alkaline Phosphatase: 70 U/L (ref 38–126)
Anion gap: 7 (ref 5–15)
BUN: 24 mg/dL — ABNORMAL HIGH (ref 8–23)
CO2: 21 mmol/L — ABNORMAL LOW (ref 22–32)
Calcium: 8.5 mg/dL — ABNORMAL LOW (ref 8.9–10.3)
Chloride: 106 mmol/L (ref 98–111)
Creatinine, Ser: 1.28 mg/dL — ABNORMAL HIGH (ref 0.61–1.24)
GFR calc Af Amer: >60 mL/min (ref >60)
GFR calc non Af Amer: 56 mL/min — ABNORMAL LOW (ref >60)
Glucose, Bld: 185 mg/dL — ABNORMAL HIGH (ref 70–99)
Potassium: 3.5 mmol/L (ref 3.5–5.1)
Sodium: 134 mmol/L — ABNORMAL LOW (ref 135–145)
Total Bilirubin: 1.3 mg/dL — ABNORMAL HIGH (ref 0.3–1.2)
Total Protein: 6.9 g/dL (ref 6.5–8.1)

## 2020-05-02 LAB — LIPASE, BLOOD: Lipase: 30 U/L (ref 11–51)

## 2020-05-02 NOTE — ED Triage Notes (Signed)
Pt comes POV with abdominal pain. Recent dx of diverticulitis. Pt still c/o fever and belly pain despite antibiotics. Told by PCP to come here. Interpreter used for triage.

## 2020-05-02 NOTE — ED Notes (Signed)
Daughter went home due to wait, please call when in treatment room.

## 2020-05-02 NOTE — H&P (Signed)
Reason for Consult:diverticulitis Referring Physician: Dr. Alfred Levins, Emergency Medicine.  Russell [name] is an 69 y.o. male.  HPI: He is known to our service from undergoing sigmoid colectomy and take down of colovesical fistula by Dr. Hampton Abbot in March.  He was recently seen in the surgery clinic, complaining of a bulging sensation near the inferior portion of his wound (which had dehisced).  He was sent for a CT scan to rule out a hernia. That scan was performed today and did not show a hernia, but did show recurrent diverticulitis just proximal to his prior anastomosis.  He was contacted by our answering service and advised to present to the ED for admission.  My interview with him today was held with the assistance of [employer] interpreter Terrial Rhodes.  He reports that he has not had any fevers or chills.  No nausea or vomiting.  He states that he has had suprapubic and left lower quadrant pain that has never really abated since his operation.  Past Medical History:  Diagnosis Date  . Arthritis   . Back pain   . 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 Hyperlipidemia   . Hypertension     Past Surgical History:  Procedure Laterality Date  . BACK SURGERY    . COLONOSCOPY WITH PROPOFOL N/A 11/05/2019   Procedure: COLONOSCOPY WITH PROPOFOL;  Surgeon: [name], MD;  Location: [facility] ENDOSCOPY;  Service: Gastroenterology;  Laterality: N/A;  . PARTIAL COLECTOMY N/A 02/11/2020   Procedure: PARTIAL COLECTOMY -- sigmoid;  Surgeon: Olean Ree, MD;  Location: [facility] ORS;  Service: General;  Laterality: N/A;  . SPINE SURGERY    . TAKE DOWN OF INTESTINAL FISTULA N/A 02/11/2020   Procedure: TAKE DOWN OF INTESTINAL FISTULA -- colovesical fistula;  Surgeon: Olean Ree, MD;  Location: [facility] ORS;  Service: General;  Laterality: N/A;  . TRACHEAL SURGERY      Family History  Problem Relation Age of Onset  . Prostate cancer Neg Hx   . Bladder Cancer Neg Hx   . Kidney cancer Neg Hx     Social History:  reports  that he has never smoked. He has never used smokeless tobacco. He reports that he does not drink alcohol and does not use drugs.  Allergies: No Known Allergies  Medications: I have reviewed the patient's current medications.  Results for orders placed or performed during the hospital encounter of 05/02/20 (from the past 48 hour(s))  Lipase, blood     Status: None   Collection Time: 05/02/20 10:23 PM  Result Value Ref Range   Lipase 30 11 - 51 U/L    Comment: Performed at [facility], [address] [place], [place] [address]  Comprehensive metabolic panel     Status: Abnormal   Collection Time: 05/02/20 10:23 PM  Result Value Ref Range   Sodium 134 (L) 135 - 145 mmol/L   Potassium 3.5 3.5 - 5.1 mmol/L   Chloride 106 98 - 111 mmol/L   CO2 21 (L) 22 - 32 mmol/L   Glucose, Bld 185 (H) 70 - 99 mg/dL    Comment: Glucose reference range applies only to samples taken after fasting for at least 8 hours.   BUN 24 (H) 8 - 23 mg/dL   Creatinine, Ser 1.28 (H) 0.61 - 1.24 mg/dL   Calcium 8.5 (L) 8.9 - 10.3 mg/dL   Total Protein 6.9 6.5 - 8.1 g/dL   Albumin 3.6 3.5 - 5.0 g/dL   AST 11 (L) 15 - 41 U/L   ALT  15 0 - 44 U/L   Alkaline Phosphatase 70 38 - 126 U/L   Total Bilirubin 1.3 (H) 0.3 - 1.2 mg/dL   GFR calc non Af Amer 56 (L) >60 mL/min   GFR calc Af Amer >60 >60 mL/min   Anion gap 7 5 - 15    Comment: Performed at [facility] [address] [place], [place] [address]  CBC     Status: None   Collection Time: 05/02/20 10:23 PM  Result Value Ref Range   WBC 9.1 4.0 - 10.5 K/uL   RBC 4.70 4.22 - 5.81 MIL/uL   Hemoglobin 13.7 13.0 - 17.0 g/dL   HCT 42.3 39 - 52 %   MCV 90.0 80.0 - 100.0 fL   MCH 29.1 26.0 - 34.0 pg   MCHC 32.4 30.0 - 36.0 g/dL   RDW 13.7 11.5 - 15.5 %   Platelets 201 150 - 400 K/uL   nRBC 0.0 0.0 - 0.2 %    Comment: Performed at [facility], [address] [place], [place] [address]    CT Abdomen Pelvis W Contrast  Result  Date: 05/02/2020 CLINICAL DATA:  Abdominal pain. EXAM: CT ABDOMEN AND PELVIS WITH CONTRAST TECHNIQUE: Multidetector CT imaging of the abdomen and pelvis was performed using the standard protocol following bolus administration of intravenous contrast. CONTRAST:  174mL OMNIPAQUE IOHEXOL 300 MG/ML  SOLN COMPARISON:  CT dated 09/06/2019. FINDINGS: Lower chest: The lung bases are suboptimally evaluated secondary to extensive respiratory motion artifact. There appears to be some subpleural reticulation and areas of scarring.The heart size is normal. Hepatobiliary: The liver is normal. Cholelithiasis without acute inflammation.There is no biliary ductal dilation. Pancreas: Normal contours without ductal dilatation. No peripancreatic fluid collection. Spleen: Unremarkable. Adrenals/Urinary Tract: --Adrenal glands: Unremarkable. --Right kidney/ureter: There is some cortical thinning involving the interpolar region of the right kidney. --Left kidney/ureter: No hydronephrosis or radiopaque kidney stones. --Urinary bladder: Unremarkable. Stomach/Bowel: --Stomach/Duodenum: No hiatal hernia or other gastric abnormality. Normal duodenal course and caliber. --Small bowel: Unremarkable. --Colon: There is sigmoid diverticulosis with CT evidence for perforated diverticulitis. There is a surgical anastomosis in the patient's pelvis related to prior resection. Adjacent to this anastomosis there is an extraluminal collection measuring approximately 3.1 cm. There is adjacent fat stranding. There is severe sigmoid diverticulosis. There is a large amount of stool in the colon. There is an area of uncomplicated diverticulitis involving the ascending colon (axial series 2, image 51). There may be an additional site of diverticulitis involving the splenic flexure of the colon. --Appendix: Normal. Vascular/Lymphatic: Atherosclerotic calcification is present within the non-aneurysmal abdominal aorta, without hemodynamically significant stenosis.  There is an IVC filter in place. There is significant penetration of multiple legs including 1 leg that appears to extend into the proximal duodenum. One of the legs penetrates into the adjacent vertebral body. At least 2 of the filter legs are fractured. --No retroperitoneal lymphadenopathy. --No mesenteric lymphadenopathy. --No pelvic or inguinal lymphadenopathy. Reproductive: Unremarkable Other: There is no significant free fluid. There is diastasis of the lower anterior abdominal wall. Musculoskeletal. There are posttraumatic changes of the left hemipelvis and left proximal femur. These appear essentially unchanged from prior study. IMPRESSION: 1. Overall findings are consistent with acute perforated diverticulitis involving the sigmoid colon adjacent to the prior surgical anastomosis. There there is a small extraluminal collection that is composed of primarily air and is not amenable to percutaneous drainage given its small size and location. 2. There is an additional area of uncomplicated  diverticulitis involving the ascending colon and perhaps the splenic flexure of the colon as well. 3. Again noted is severe sigmoid diverticulosis. There is a large amount of stool in the colon. 4. Fractured IVC filter with significant penetration of multiple legs into the surrounding structures. Nonemergent retrieval should be considered. 5. Suboptimal evaluation of the lung bases secondary to respiratory motion artifact. However, there are findings suggestive of pulmonary fibrosis. Consider a nonemergent outpatient CT of the chest for further evaluation. 6. There is cholelithiasis without secondary signs of acute cholecystitis. 7. Additional chronic findings as detailed above. Aortic Atherosclerosis (ICD10-I70.0). These results will be called to the ordering clinician or representative by the Radiologist Assistant, and communication documented in the PACS or [employer]. Electronically Signed   By: Constance Holster  M.D.   On: 05/02/2020 19:38    Review of Systems  All other systems reviewed and are negative.  Physical Exam  Constitutional: He is oriented to person, place, and time. No distress.  HENT:  Head: Normocephalic and atraumatic.  Mouth/Throat: Mucous membranes are moist.  Eyes: No scleral icterus.  Cardiovascular: Normal rate and regular rhythm.  Respiratory: Effort normal. No respiratory distress.  GI: Soft.  Midline surgical scar without evidence of hernia.  Tenderness to palpation in the left lower quadrant and suprapubic area.  No rebound or guarding.  Genitourinary:    Genitourinary Comments: Deferred   Musculoskeletal:        General: No deformity.  Neurological: He is alert and oriented to person, place, and time.  Skin: Skin is warm and dry.  Psychiatric: His behavior is normal. Mood normal.   Today's Vitals   05/03/20 0416 05/03/20 0807 05/03/20 1213 05/03/20 1236  BP: 109/69     Pulse: 80     Resp: 18     Temp: 98.2 F (36.8 C)     TempSrc: Oral     SpO2: 96%     Weight:      Height:      PainSc: 0-No pain 0-No pain 9  5    Body mass index is 28.47 kg/m.  Assessment/Plan: --admit to surgery --NPO except sips with meds and ice chips --IVF --IV abx

## 2020-05-03 ENCOUNTER — Inpatient Hospital Stay
Admission: EM | Admit: 2020-05-03 | Discharge: 2020-05-06 | DRG: 392 | Disposition: A | Discharge status: Home / Self Care | Payer: Medicare Other | Attending: General Surgery | Admitting: General Surgery

## 2020-05-03 DIAGNOSIS — Z20822 Contact with and (suspected) exposure to covid-19: Secondary | ICD-10-CM | POA: Diagnosis present

## 2020-05-03 DIAGNOSIS — K5720 Diverticulitis of large intestine with perforation and abscess without bleeding: Secondary | ICD-10-CM | POA: Diagnosis present

## 2020-05-03 DIAGNOSIS — Z86711 Personal history of pulmonary embolism: Secondary | ICD-10-CM | POA: Diagnosis not present

## 2020-05-03 DIAGNOSIS — Z91013 Allergy to seafood: Secondary | ICD-10-CM | POA: Diagnosis not present

## 2020-05-03 DIAGNOSIS — I10 Essential (primary) hypertension: Secondary | ICD-10-CM | POA: Diagnosis present

## 2020-05-03 DIAGNOSIS — K219 Gastro-esophageal reflux disease without esophagitis: Secondary | ICD-10-CM | POA: Diagnosis present

## 2020-05-03 DIAGNOSIS — Z79899 Other long term (current) drug therapy: Secondary | ICD-10-CM | POA: Diagnosis not present

## 2020-05-03 DIAGNOSIS — E782 Mixed hyperlipidemia: Secondary | ICD-10-CM | POA: Diagnosis present

## 2020-05-03 LAB — CBC
HCT: 44.6 % (ref 39.0–52.0)
HCT: 37.7 % — ABNORMAL LOW (ref 39.0–52.0)
Hemoglobin: 13.9 g/dL (ref 13.0–17.0)
Hemoglobin: 12.7 g/dL — ABNORMAL LOW (ref 13.0–17.0)
MCH: 29.2 pg (ref 26.0–34.0)
MCH: 29.7 pg (ref 26.0–34.0)
MCHC: 31.2 g/dL (ref 30.0–36.0)
MCHC: 33.7 g/dL (ref 30.0–36.0)
MCV: 93.7 fL (ref 80.0–100.0)
MCV: 88.1 fL (ref 80.0–100.0)
Platelets: 188 10*3/uL (ref 150–400)
Platelets: 190 10*3/uL (ref 150–400)
RBC: 4.76 MIL/uL (ref 4.22–5.81)
RBC: 4.28 MIL/uL (ref 4.22–5.81)
RDW: 13.7 % (ref 11.5–15.5)
RDW: 13.9 % (ref 11.5–15.5)
WBC: 8.6 10*3/uL (ref 4.0–10.5)
WBC: 6.2 10*3/uL (ref 4.0–10.5)
nRBC: 0 % (ref 0.0–0.2)
nRBC: 0 % (ref 0.0–0.2)

## 2020-05-03 LAB — BASIC METABOLIC PANEL
Anion gap: 8 (ref 5–15)
BUN: 19 mg/dL (ref 8–23)
CO2: 23 mmol/L (ref 22–32)
Calcium: 8.4 mg/dL — ABNORMAL LOW (ref 8.9–10.3)
Chloride: 107 mmol/L (ref 98–111)
Creatinine, Ser: 1.13 mg/dL (ref 0.61–1.24)
GFR calc Af Amer: >60 mL/min (ref >60)
GFR calc non Af Amer: >60 mL/min (ref >60)
Glucose, Bld: 89 mg/dL (ref 70–99)
Potassium: 3.5 mmol/L (ref 3.5–5.1)
Sodium: 138 mmol/L (ref 135–145)

## 2020-05-03 LAB — MAGNESIUM: Magnesium: 2 mg/dL (ref 1.7–2.4)

## 2020-05-03 LAB — SARS CORONAVIRUS 2 BY RT PCR (HOSPITAL ORDER, PERFORMED IN ~~LOC~~ HOSPITAL LAB): SARS Coronavirus 2: NEGATIVE

## 2020-05-03 LAB — GLUCOSE, CAPILLARY: Glucose-Capillary: 73 mg/dL (ref 70–99)

## 2020-05-03 LAB — PHOSPHORUS: Phosphorus: 3.3 mg/dL (ref 2.5–4.6)

## 2020-05-03 MED ORDER — PANTOPRAZOLE SODIUM 40 MG IV SOLR
40.0000 mg | Freq: Every day | INTRAVENOUS | Status: DC
  Administered 2020-05-03 – 2020-05-05 (×4): 40 mg via INTRAVENOUS
  Filled 2020-05-03 (×4): qty 40

## 2020-05-03 MED ORDER — PIPERACILLIN-TAZOBACTAM 3.375 G IVPB
3.3750 g | Freq: Three times a day (TID) | INTRAVENOUS | Status: DC
  Administered 2020-05-03 – 2020-05-04 (×6): 3.375 g via INTRAVENOUS
  Filled 2020-05-03 (×6): qty 50

## 2020-05-03 MED ORDER — ENOXAPARIN SODIUM 40 MG/0.4ML ~~LOC~~ SOLN
40.0000 mg | SUBCUTANEOUS | Status: DC
  Administered 2020-05-03 – 2020-05-06 (×4): 40 mg via SUBCUTANEOUS
  Filled 2020-05-03 (×4): qty 0.4

## 2020-05-03 MED ORDER — TRAMADOL HCL 50 MG PO TABS: 50.0000 mg | ORAL_TABLET | Freq: Four times a day (QID) | ORAL | Status: DC | PRN

## 2020-05-03 MED ORDER — ONDANSETRON 4 MG PO TBDP: 4.0000 mg | ORAL_TABLET | Freq: Four times a day (QID) | ORAL | Status: DC | PRN

## 2020-05-03 MED ORDER — ACETAMINOPHEN 650 MG RE SUPP: 650.0000 mg | Freq: Four times a day (QID) | RECTAL | Status: DC | PRN

## 2020-05-03 MED ORDER — ACETAMINOPHEN 325 MG PO TABS: 650.0000 mg | ORAL_TABLET | Freq: Four times a day (QID) | ORAL | Status: DC | PRN

## 2020-05-03 MED ORDER — ONDANSETRON HCL 4 MG/2ML IJ SOLN: 4.0000 mg | Freq: Four times a day (QID) | INTRAMUSCULAR | Status: DC | PRN

## 2020-05-03 MED ORDER — PIPERACILLIN-TAZOBACTAM 3.375 G IVPB 30 MIN
3.3750 g | Freq: Once | INTRAVENOUS | Status: DC
  Filled 2020-05-03: qty 50

## 2020-05-03 MED ORDER — HYDROMORPHONE HCL 1 MG/ML IJ SOLN
0.5000 mg | INTRAMUSCULAR | Status: DC | PRN
  Administered 2020-05-03 (×2): 0.5 mg via INTRAVENOUS
  Filled 2020-05-03 (×2): qty 0.5

## 2020-05-03 MED ORDER — SODIUM CHLORIDE 0.9 % IV SOLN
INTRAVENOUS | Status: DC
  Filled 2020-05-03 (×10): qty 1000

## 2020-05-03 NOTE — ED Provider Notes (Signed)
[facility] Emergency Department Provider Note  ____________________________________________  Time seen: Approximately 12:21 AM  I have reviewed the triage vital signs and the nursing notes.   HISTORY  Chief Complaint Abdominal Pain   HPI Russell [name] is a 69 y.o. male with a history of a colovesical fistula status post sigmoid colectomy  on March 2021 who was sent to the emergency room by surgery for perforated diverticulitis.  Patient reports over the last 20 days he has had mild intermittent left-sided abdominal pain.  Went to see his surgeon yesterday and had a CT scan which showed perforated diverticulitis.  Patient was contacted and sent to the hospital for admission.  Patient denies any pain at this time.  Reports that his pain has been intermittent, sharp and in the left lower quadrant.  Associated with nausea and decreased appetite.  The pain is worse after he eats.  No fever, no vomiting, no chest pain or shortness of breath.  Past Medical History:  Diagnosis Date  . Arthritis   . Back pain   . 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 Hyperlipidemia   . Hypertension     Patient Active Problem List   Diagnosis Date Noted  . Diverticulitis of colon with perforation 05/03/2020  . 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  . Carpal joint sprain 09/11/2019  . Cervical spondylosis without myelopathy 09/11/2019  . DDD (degenerative disc disease), cervical 09/11/2019  . Lumbosacral stenosis 08/30/2019  . Degeneration of lumbosacral intervertebral disc 08/30/2019  . 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 Hypotension 02/10/2018  . Acute blood loss anemia 02/10/2018  . S/P spinal fusion 01/17/2018  . Other specified abnormal immunological findings in serum 11/27/2017  . Postoperative or surgical complication [id number]/[id number]  . 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[place]) 11/25/2017  . Fluid collection at surgical site 11/25/2017  . Delirium due to another medical condition  10/05/2017  . BMI 26.0-26.9,adult 06/26/2017  . GERD (gastroesophageal reflux disease) 05/09/2017  . Chronic cough 11/21/2016  . Heterotopic ossification 03/01/2016  . 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 Pyuria 11/27/2015  . Oropharyngeal dysphagia 11/27/2015  . Macrocytic anemia 11/27/2015  . History of pulmonary embolism 11/27/2015  . History of malnutrition 11/27/2015  . Acute encephalopathy 11/27/2015  . 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 Urinary retention 11/27/2015  . Personal history of other malignant neoplasm of skin 07/17/2015  . Hyperlipidemia, mixed 05/05/2015  . Excessive cerumen in both ear canals 04/23/2015  . Skin lesions, generalized 04/23/2015  . Need for vaccination 04/23/2015  . Increased frequency of urination 04/23/2015  . Helicobacter pylori (H. pylori) infection 02/11/2014  . Dyspepsia 02/05/2014  . Neck pain 10/11/2013  . Pain in joint, pelvic region and thigh 10/11/2013  . Low back pain 10/11/2013    Past Surgical History:  Procedure Laterality Date  . BACK SURGERY    . COLONOSCOPY WITH PROPOFOL N/A 11/05/2019   Procedure: COLONOSCOPY WITH PROPOFOL;  Surgeon: [name], MD;  Location: [facility] ENDOSCOPY;  Service: Gastroenterology;  Laterality: N/A;  . PARTIAL COLECTOMY N/A 02/11/2020   Procedure: PARTIAL COLECTOMY -- sigmoid;  Surgeon: Olean Ree, MD;  Location: [facility] ORS;  Service: General;  Laterality: N/A;  . SPINE SURGERY    . TAKE DOWN OF INTESTINAL FISTULA N/A 02/11/2020   Procedure: TAKE DOWN OF INTESTINAL FISTULA -- colovesical fistula;  Surgeon: Olean Ree, MD;  Location: [facility] ORS;  Service: General;  Laterality: N/A;  . TRACHEAL SURGERY      Prior to Admission medications   Medication Sig Start  Date End Date Taking? Authorizing Provider  atorvastatin (LIPITOR) 20 MG tablet Take 20 mg by mouth daily. 08/20/19   [provider]  dicyclomine (BENTYL) 10 MG capsule Take 1 capsule (10 mg total) by mouth 4 (four) times daily -   before meals and at bedtime. 04/29/20 07/28/20  [name], MD  doxycycline (VIBRAMYCIN) 100 MG capsule Take 100 mg by mouth daily. 01/03/20   [provider]  ondansetron (ZOFRAN) 4 MG tablet Take 1 tablet (4 mg total) by mouth every 8 (eight) hours as needed for nausea or vomiting. 04/29/20   Tylene Fantasia, PA-C  oxyCODONE (OXY IR/ROXICODONE) 5 MG immediate release tablet Take 1 tablet (5 mg total) by mouth every 6 (six) hours as needed for severe pain or breakthrough pain. 04/29/20   Tylene Fantasia, PA-C    Allergies Patient has no known allergies.  Family History  Problem Relation Age of Onset  . Prostate cancer Neg Hx   . Bladder Cancer Neg Hx   . Kidney cancer Neg Hx     Social History Social History   Tobacco Use  . Smoking status: Never Smoker  . Smokeless tobacco: Never Used  Vaping Use  . Vaping Use: Never used  Substance Use Topics  . Alcohol use: No  . Drug use: No    Review of Systems  Constitutional: Negative for fever. Eyes: Negative for visual changes. ENT: Negative for sore throat. Neck: No neck pain  Cardiovascular: Negative for chest pain. Respiratory: Negative for shortness of breath. Gastrointestinal: + abdominal pain and nausea. No vomiting or diarrhea. Genitourinary: Negative for dysuria. Musculoskeletal: Negative for back pain. Skin: Negative for rash. Neurological: Negative for headaches, weakness or numbness. Psych: No SI or HI  ____________________________________________   PHYSICAL EXAM:  VITAL SIGNS: ED Triage Vitals  Enc Vitals Group     BP 05/02/20 2222 114/76     Pulse Rate 05/02/20 2222 (!) 106     Resp 05/02/20 2222 18     Temp 05/02/20 2222 99.3 F (37.4 C)     Temp Source 05/02/20 2222 Oral     SpO2 05/02/20 2222 94 %     Weight 05/02/20 2221 198 lb 6.6 oz (90 kg)     Height 05/02/20 2221 5\' 10"  (1.778 m)     Head Circumference --      Peak Flow --      Pain Score 05/02/20 2220 8     Pain Loc --      Pain  Edu? --      Excl. in [place]? --     Constitutional: Alert and oriented. Well appearing and in no apparent distress. HEENT:      Head: Normocephalic and atraumatic.         Eyes: Conjunctivae are normal. Sclera is non-icteric.       Mouth/Throat: Mucous membranes are moist.       Neck: Supple with no signs of meningismus. Cardiovascular: Regular rate and rhythm. No murmurs, gallops, or rubs.  Respiratory: Normal respiratory effort. Lungs are clear to auscultation bilaterally.  Gastrointestinal: Soft, non tender, and non distended with positive bowel sounds. No rebound or guarding. Musculoskeletal: No edema, cyanosis, or erythema of extremities. Neurologic: Normal speech and language. Face is symmetric. Moving all extremities. No gross focal neurologic deficits are appreciated. Skin: Skin is warm, dry and intact. No rash noted. Psychiatric: Mood and affect are normal. Speech and behavior are normal.  ____________________________________________   LABS (all labs ordered are  listed, but only abnormal results are displayed)  Labs Reviewed  COMPREHENSIVE METABOLIC PANEL - Abnormal; Notable for the following components:      Result Value   Sodium 134 (*)    CO2 21 (*)    Glucose, Bld 185 (*)    BUN 24 (*)    Creatinine, Ser 1.28 (*)    Calcium 8.5 (*)    AST 11 (*)    Total Bilirubin 1.3 (*)    GFR calc non Af Amer 56 (*)    All other components within normal limits  SARS CORONAVIRUS 2 BY RT PCR (HOSPITAL ORDER, [facility])  LIPASE, BLOOD  CBC  URINALYSIS, COMPLETE (UACMP) WITH MICROSCOPIC  CBC  BASIC METABOLIC PANEL  MAGNESIUM  PHOSPHORUS  CBC   ____________________________________________  EKG  ED ECG REPORT I, Rudene Re, the attending physician, personally viewed and interpreted this ECG.  Sinus tachycardia, rate of 103, normal intervals, normal axis, no ST elevations or  depressions. ____________________________________________  RADIOLOGY  I have personally reviewed the images performed during this visit and I agree with the Radiologist's read.   Interpretation by Radiologist:  No results found.   CLINICAL DATA:  Abdominal pain.  EXAM: CT ABDOMEN AND PELVIS WITH CONTRAST  TECHNIQUE: Multidetector CT imaging of the abdomen and pelvis was performed using the standard protocol following bolus administration of intravenous contrast.  CONTRAST:  125mL OMNIPAQUE IOHEXOL 300 MG/ML  SOLN  COMPARISON:  CT dated 09/06/2019.  FINDINGS: Lower chest: The lung bases are suboptimally evaluated secondary to extensive respiratory motion artifact. There appears to be some subpleural reticulation and areas of scarring.The heart size is normal.  Hepatobiliary: The liver is normal. Cholelithiasis without acute inflammation.There is no biliary ductal dilation.  Pancreas: Normal contours without ductal dilatation. No peripancreatic fluid collection.  Spleen: Unremarkable.  Adrenals/Urinary Tract:  --Adrenal glands: Unremarkable.  --Right kidney/ureter: There is some cortical thinning involving the interpolar region of the right kidney.  --Left kidney/ureter: No hydronephrosis or radiopaque kidney stones.  --Urinary bladder: Unremarkable.  Stomach/Bowel:  --Stomach/Duodenum: No hiatal hernia or other gastric abnormality. Normal duodenal course and caliber.  --Small bowel: Unremarkable.  --Colon: There is sigmoid diverticulosis with CT evidence for perforated diverticulitis. There is a surgical anastomosis in the patient's pelvis related to prior resection. Adjacent to this anastomosis there is an extraluminal collection measuring approximately 3.1 cm. There is adjacent fat stranding. There is severe sigmoid diverticulosis. There is a large amount of stool in the colon. There is an area of uncomplicated diverticulitis involving  the ascending colon (axial series 2, image 51). There may be an additional site of diverticulitis involving the splenic flexure of the colon.  --Appendix: Normal.  Vascular/Lymphatic: Atherosclerotic calcification is present within the non-aneurysmal abdominal aorta, without hemodynamically significant stenosis. There is an IVC filter in place. There is significant penetration of multiple legs including 1 leg that appears to extend into the proximal duodenum. One of the legs penetrates into the adjacent vertebral body. At least 2 of the filter legs are fractured.  --No retroperitoneal lymphadenopathy.  --No mesenteric lymphadenopathy.  --No pelvic or inguinal lymphadenopathy.  Reproductive: Unremarkable  Other: There is no significant free fluid. There is diastasis of the lower anterior abdominal wall.  Musculoskeletal. There are posttraumatic changes of the left hemipelvis and left proximal femur. These appear essentially unchanged from prior study.  IMPRESSION: 1. Overall findings are consistent with acute perforated diverticulitis involving the sigmoid colon adjacent to the prior surgical anastomosis. There  there is a small extraluminal collection that is composed of primarily air and is not amenable to percutaneous drainage given its small size and location. 2. There is an additional area of uncomplicated diverticulitis involving the ascending colon and perhaps the splenic flexure of the colon as well. 3. Again noted is severe sigmoid diverticulosis. There is a large amount of stool in the colon. 4. Fractured IVC filter with significant penetration of multiple legs into the surrounding structures. Nonemergent retrieval should be considered. 5. Suboptimal evaluation of the lung bases secondary to respiratory motion artifact. However, there are findings suggestive of pulmonary fibrosis. Consider a nonemergent outpatient CT of the chest for further  evaluation. 6. There is cholelithiasis without secondary signs of acute cholecystitis. 7. Additional chronic findings as detailed above.  Aortic Atherosclerosis (ICD10-I70.0).  These results will be called to the ordering clinician or representative by the Radiologist Assistant, and communication documented in the PACS or [employer].   Electronically Signed   By: Constance Holster M.D.   On: 05/02/2020 19:38 ____________________________________________   PROCEDURES  Procedure(s) performed:yes .1-3 Lead EKG Interpretation Performed by: Rudene Re, MD Authorized by: Rudene Re, MD     Interpretation: normal     ECG rate assessment: tachycardic     Rhythm: sinus tachycardia     Ectopy: none     Critical Care performed: yes  CRITICAL CARE Performed by: Rudene Re  ?  Total critical care time: 30 min  Critical care time was exclusive of separately billable procedures and treating other patients.  Critical care was necessary to treat or prevent imminent or life-threatening deterioration.  Critical care was time spent personally by me on the following activities: development of treatment plan with patient and/or surrogate as well as nursing, discussions with consultants, evaluation of patient's response to treatment, examination of patient, obtaining history from patient or surrogate, ordering and performing treatments and interventions, ordering and review of laboratory studies, ordering and review of radiographic studies, pulse oximetry and re-evaluation of patient's condition.  ____________________________________________   INITIAL IMPRESSION / ASSESSMENT AND PLAN / ED COURSE   69 y.o. male with a history of a colovesical fistula status post sigmoid colectomy  on March 2021 who was sent to the emergency room by surgery for perforated diverticulitis.  Patient is well-appearing in no distress, low-grade temp of 99.58F, nonsurgical abdomen  on exam with no rebound or guarding, no peritonitis signs.  CT reviewed by me showing perforated diverticulitis, confirmed by radiology.  Labs showing no significant electrolyte derangements, stable kidney function, no leukocytosis.  No signs of sepsis.  Dr. Celine Ahr is aware the patient is here.  We will start him on IV Zosyn per her recommendation and admit to the surgical service.  Old medical records reviewed.      _____________________________________________ Please note:  Patient was evaluated in Emergency Department today for the symptoms described in the history of present illness. Patient was evaluated in the context of the global COVID-19 pandemic, which necessitated consideration that the patient might be at risk for infection with the SARS-CoV-2 virus that causes COVID-19. Institutional protocols and algorithms that pertain to the evaluation of patients at risk for COVID-19 are in a state of rapid change based on information released by regulatory bodies including the CDC and federal and state organizations. These policies and algorithms were followed during the patient's care in the ED.  Some ED evaluations and interventions may be delayed as a result of limited staffing during the pandemic.   [place]  Controlled Substance Database was reviewed by me. ____________________________________________   FINAL CLINICAL IMPRESSION(S) / ED DIAGNOSES   Final diagnoses:  Diverticulitis of large intestine with perforation without bleeding      NEW MEDICATIONS STARTED DURING THIS VISIT:  ED Discharge Orders    None       Note:  This document was prepared using Dragon voice recognition software and may include unintentional dictation errors.    Alfred Levins, [place], MD 05/03/20 [phone]

## 2020-05-03 NOTE — ED Notes (Signed)
Pt given warm blanket and a pillow. 

## 2020-05-04 ENCOUNTER — Ambulatory Visit: Payer: Medicare Other

## 2020-05-04 LAB — GLUCOSE, CAPILLARY: Glucose-Capillary: 77 mg/dL (ref 70–99)

## 2020-05-04 NOTE — Progress Notes (Addendum)
[place] Hospital Day(s): 1.   Post op day(s): ~2.5 Months .   Interval History:  Patient seen and examined no acute events or new complaints overnight.  Patient reports he is feeling better Abdominal pain in the LLQ improved from presentation No fever, chills, nausea, or emesis No new labs, did not have leukocytosis on admission  No new imaging Good UO - 2.25 L  He has been NPO since admission + Bowel function   Vital signs in last 24 hours: [min-max] current  Temp:  [97.9 F (36.6 C)-98.3 F (36.8 C)] 98 F (36.7 C) (06/14 0512) Pulse Rate:  [77-88] 85 (06/14 0512) Resp:  [16-20] 20 (06/14 0512) BP: (100-117)/(74-80) 100/78 (06/14 0512) SpO2:  [93 %-95 %] 94 % (06/14 0512)     Height: 5\' 10"  (177.8 cm) Weight: 90 kg BMI (Calculated): 28.47   Intake/Output last 2 shifts:  06/13 0701 - 06/14 0700 In: 2460.3 [I.V.:2344.8; IV Piggyback:115.5] Out: 2250 [Urine:2250]   Physical Exam:  Constitutional: alert, cooperative and no distress  Respiratory: breathing non-labored at rest  Cardiovascular: regular rate and sinus rhythm  Gastrointestinal: soft, LLQ tenderness improved, and non-distended, no rebound/guarding Integumentary: Midline wound well healed, warm, dry   Labs:  CBC Latest Ref Rng & Units 05/03/2020 05/03/2020 05/02/2020  WBC 4.0 - 10.5 K/uL 6.2 8.6 9.1  Hemoglobin 13.0 - 17.0 g/dL 12.7(L) 13.9 13.7  Hematocrit 39 - 52 % 37.7(L) 44.6 42.3  Platelets 150 - 400 K/uL 190 188 201   CMP Latest Ref Rng & Units 05/03/2020 05/02/2020 04/29/2020  Glucose 70 - 99 mg/dL 89 185(H) -  BUN 8 - 23 mg/dL 19 24(H) 22  Creatinine 0.61 - 1.24 mg/dL 1.13 1.28(H) 1.00  Sodium 135 - 145 mmol/L 138 134(L) -  Potassium 3.5 - 5.1 mmol/L 3.5 3.5 -  Chloride 98 - 111 mmol/L 107 106 -  CO2 22 - 32 mmol/L 23 21(L) -  Calcium 8.9 - 10.3 mg/dL 8.4(L) 8.5(L) -  Total Protein 6.5 - 8.1 g/dL - 6.9 -  Total Bilirubin 0.3 - 1.2 mg/dL - 1.3(H) -   Alkaline Phos 38 - 126 U/L - 70 -  AST 15 - 41 U/L - 11(L) -  ALT 0 - 44 U/L - 15 -     Imaging studies: No new pertinent imaging studies   Assessment/Plan:  69 y.o. male with improved abdominal pain admitted with recurrent diverticulitis near proximal anastomosis ~2.5 months s/p sigmoid colectomy and bladder repair for colovesical fistula.   - Will initiate CLD today  - Continue IV ABx (Zosyn)    - Pain control prn; antiemetics prn   - Monitor abdominal examination; on-going bowel function  - mobilization as tolerated   - medical management of comorbid conditions; home medications  - DVT prophylaxis    All of the above findings and recommendations were discussed with the patient, and the medical team, and all of patient's questions were answered to his expressed satisfaction.  -- Edison Simon, PA-C [facility] 05/04/2020, 7:45 AM [phone] M-F: 7am - 4pm  I saw and evaluated the patient.  I agree with the above documentation, exam, and plan, which I have edited where appropriate. Fredirick Maudlin  8:59 AM

## 2020-05-04 NOTE — Progress Notes (Signed)
Pt up to shower on arrival. Will return for PIV attempt.

## 2020-05-05 MED ORDER — ATORVASTATIN CALCIUM 20 MG PO TABS
20.0000 mg | ORAL_TABLET | Freq: Every evening | ORAL | Status: DC
  Administered 2020-05-05: 20 mg via ORAL
  Filled 2020-05-05: qty 1

## 2020-05-05 MED ORDER — CIPROFLOXACIN IN D5W 400 MG/200ML IV SOLN
400.0000 mg | Freq: Two times a day (BID) | INTRAVENOUS | Status: DC
  Administered 2020-05-05 – 2020-05-06 (×3): 400 mg via INTRAVENOUS
  Filled 2020-05-05 (×5): qty 200

## 2020-05-05 MED ORDER — METRONIDAZOLE IN NACL 5-0.79 MG/ML-% IV SOLN
500.0000 mg | Freq: Three times a day (TID) | INTRAVENOUS | Status: DC
  Administered 2020-05-05 – 2020-05-06 (×3): 500 mg via INTRAVENOUS
  Filled 2020-05-05 (×7): qty 100

## 2020-05-05 NOTE — Progress Notes (Signed)
Zack PA notified pt started to develop a rash in around his neck and legs. Per PA okay for RN to stop IV antibiotics for now. Pt is A/O X 4 and is not complaining of having trouble breathing. RN will continue to monitor this pt.

## 2020-05-05 NOTE — Progress Notes (Addendum)
[place] Hospital Day(s): 2.   Post op day(s): ~2.5 Months  Interval History:  Patient seen and examined Overnight, he developed rash to his neck/legs, no trouble breathing or facial swelling, presumed to be from ABx, had previously tolerated penicillins outpatient, no other new exposures reported.  Patient reports he is feeling better this morning, mild soreness in the LLQ but improved No fever, chills, nausea, or emesis No new labs No new imaging Tolerated CLD, + Bowel function  Mobilizing  Vital signs in last 24 hours: [min-max] current  Temp:  [97.8 F (36.6 C)-98.5 F (36.9 C)] 98.5 F (36.9 C) (06/14 1935) Pulse Rate:  [69-75] 74 (06/14 1935) Resp:  [16-18] 17 (06/14 1935) BP: (104-112)/(73-80) 104/79 (06/14 1935) SpO2:  [94 %-97 %] 96 % (06/14 1935)     Height: 5\' 10"  (177.8 cm) Weight: 90 kg BMI (Calculated): 28.47   Intake/Output last 2 shifts:  06/14 0701 - 06/15 0700 In: 1515.3 [P.O.:960; I.V.:481.8; IV Piggyback:73.4] Out: 1100 [Urine:1100]   Physical Exam:  Constitutional: alert, cooperative and no distress  Respiratory: breathing non-labored at rest  Cardiovascular: regular rate and sinus rhythm  Gastrointestinal: soft, LLQ tenderness improved, and non-distended, no rebound/guarding Integumentary: Midline wound well healed, warm, dry. He does have a faint macular rash to the bilateral legs and neck   Labs:  CBC Latest Ref Rng & Units 05/03/2020 05/03/2020 05/02/2020  WBC 4.0 - 10.5 K/uL 6.2 8.6 9.1  Hemoglobin 13.0 - 17.0 g/dL 12.7(L) 13.9 13.7  Hematocrit 39 - 52 % 37.7(L) 44.6 42.3  Platelets 150 - 400 K/uL 190 188 201   CMP Latest Ref Rng & Units 05/03/2020 05/02/2020 04/29/2020  Glucose 70 - 99 mg/dL 89 185(H) -  BUN 8 - 23 mg/dL 19 24(H) 22  Creatinine 0.61 - 1.24 mg/dL 1.13 1.28(H) 1.00  Sodium 135 - 145 mmol/L 138 134(L) -  Potassium 3.5 - 5.1 mmol/L 3.5 3.5 -  Chloride 98 - 111 mmol/L 107 106 -  CO2 22 -  32 mmol/L 23 21(L) -  Calcium 8.9 - 10.3 mg/dL 8.4(L) 8.5(L) -  Total Protein 6.5 - 8.1 g/dL - 6.9 -  Total Bilirubin 0.3 - 1.2 mg/dL - 1.3(H) -  Alkaline Phos 38 - 126 U/L - 70 -  AST 15 - 41 U/L - 11(L) -  ALT 0 - 44 U/L - 15 -    Imaging studies: No new pertinent imaging studies   Assessment/Plan:  69 y.o. male with improved abdominal pain admitted with recurrent diverticulitis near proximal anastomosis ~2.5 months s/p sigmoid colectomy and bladder repairfor colovesical fistula.   - Will advance to full liquid diet; soft for lunch or dinner if doing well   - Given new onset of rash with Zosyn; I will switch to Cipro/Flagyl as a precaution    - Pain control prn; antiemetics prn              - Monitor abdominal examination; on-going bowel function             - mobilization as tolerated              - medical management of comorbid conditions; home medications             - DVT prophylaxis     - Discharge Planning: If tolerates advancement of diet and continues to do well clinically, hopefully home in next 24-48 hours  All of the above findings and recommendations were discussed with  the patient, and the medical team, and all of patient's questions were answered to his expressed satisfaction.  -- Edison Simon, PA-C [facility] 05/05/2020, 7:51 AM [phone] M-F: 7am - 4pm

## 2020-05-06 MED ORDER — METRONIDAZOLE 500 MG PO TABS
500.0000 mg | ORAL_TABLET | Freq: Three times a day (TID) | ORAL | 0 refills | Status: AC
Start: 2020-05-06 — End: 2020-05-16

## 2020-05-06 MED ORDER — CIPROFLOXACIN HCL 500 MG PO TABS
500.0000 mg | ORAL_TABLET | Freq: Two times a day (BID) | ORAL | 0 refills | Status: AC
Start: 2020-05-06 — End: 2020-05-16

## 2020-05-06 NOTE — Progress Notes (Signed)
Russell [name]  A and O x 4. VSS. Pt tolerating diet well. No complaints of pain or nausea. IV removed intact, prescriptions given. Pt voiced understanding of discharge instructions with no further questions. Pt discharged home.    Allergies as of 05/06/2020      Reactions   Shrimp [shellfish Allergy] Hives      Medication List    TAKE these medications   atorvastatin 20 MG tablet Commonly known as: LIPITOR Take 20 mg by mouth daily.   ciprofloxacin 500 MG tablet Commonly known as: Cipro Take 1 tablet (500 mg total) by mouth 2 (two) times daily for 10 days.   dicyclomine 10 MG capsule Commonly known as: BENTYL Take 1 capsule (10 mg total) by mouth 4 (four) times daily -  before meals and at bedtime.   doxycycline 100 MG capsule Commonly known as: VIBRAMYCIN Take 100 mg by mouth daily.   metroNIDAZOLE 500 MG tablet Commonly known as: Flagyl Take 1 tablet (500 mg total) by mouth 3 (three) times daily for 10 days.   ondansetron 4 MG tablet Commonly known as: Zofran Take 1 tablet (4 mg total) by mouth every 8 (eight) hours as needed for nausea or vomiting.   oxyCODONE 5 MG immediate release tablet Commonly known as: Oxy IR/ROXICODONE Take 1 tablet (5 mg total) by mouth every 6 (six) hours as needed for severe pain or breakthrough pain.       Vitals:   05/06/20 1220 05/06/20 1258  BP:  111/76  Pulse: 77 75  Resp: 20   Temp: 97.9 F (36.6 C)   SpO2: 96%     Francesco Sor

## 2020-05-06 NOTE — Care Management Important Message (Signed)
Important Message  Patient Details  Name: Russell [name] [name]: [id number] Date of Birth: Jul 13, 1951   Medicare Important Message Given:  Yes     Dannette Barbara 05/06/2020, 11:16 AM

## 2020-05-06 NOTE — Discharge Instructions (Signed)
Diverticulitis Diverticulitis  La diverticulitis ocurre cuando pequeos bolsillos que se han formado en el intestino grueso (colon) se infectan o se inflaman. Esto produce dolor de Paramedic y heces lquidas (diarrea). Estas bolsas en el colon se denominan divertculos. Se forman en las personas que tienen una afeccin llamada diverticulitis. Siga estas indicaciones en su casa: Medicamentos  [employer] de venta libre y los recetados solamente como se lo haya indicado el mdico. Estos incluyen los siguientes: ? Antibiticos. ? Analgsicos. ? Pastillas de [place]. ? Probiticos. ? Laxantes.  No conduzca ni use maquinaria pesada mientras toma analgsicos recetados.  Si le recetaron un antibitico, tmelo como se lo hayan indicado. No deje de tomarlos aunque se sienta mejor. Instrucciones generales   Siga la dieta como se lo haya indicado el mdico.  Cuando se sienta mejor, el mdico puede indicarle que cambie la dieta. Tal vez necesite ingerir gran cantidad de fibra. La fibra facilita la evacuacin intestinal (defecacin). Entre los alimentos saludables con [place], se incluyen los siguientes: ? Frutos rojos. ? Frijoles. ? Lentejas. ? Verduras de [employer].  Haga ejercicios 3 o ms veces por semana. Hgalos durante 30 minutos cada vez. Ejerctese lo suficiente como para transpirar y Conservation officer, historic buildings los latidos cardacos.  Concurra a todas las visitas de control como se lo hayan indicado. Esto es importante. Puede que tenga que someterse a un examen del intestino grueso. Esto se denomina colonoscopia. Comunquese con un mdico si:  El dolor no mejora.  Le cuesta mucho comer o beber.  No defeca como lo hace normalmente. Solicite ayuda de inmediato si:  El Holiday representative.  Los problemas no mejoran.  Los problemas empeoran muy rpidamente.  Tiene fiebre.  Devuelve (vomita) ms de una vez.  Sus heces tienen las siguientes caractersticas: ? Teacher, English as a foreign language. ? Son de color  negro. ? Son alquitranadas. Resumen  La diverticulitis ocurre cuando pequeos bolsillos que se han formado en el intestino grueso (colon) se infectan o se inflaman.  Tome los medicamentos solamente como se lo haya indicado el mdico.  Siga la dieta como se lo haya indicado el mdico. Esta informacin no tiene Marine scientist el consejo del mdico. Asegrese de hacerle al mdico cualquier pregunta que tenga. Document Revised: 05/11/2017 Document Reviewed: 05/11/2017 Elsevier Patient Education  [place].

## 2020-05-06 NOTE — Discharge Summary (Addendum)
[facility] SURGICAL DISCHARGE SUMMARY   Patient ID: Russell [name] [name]: [id number] DOB/AGE: 07-02-1951 69 y.o.  Admit date: 05/03/2020 Discharge date: 05/06/2020  Discharge Diagnoses Patient Active Problem List   Diagnosis Date Noted   Diverticulitis of colon with perforation 05/03/2020    Consultants None  Procedures None  HPI: Russell [name] is an 69 y.o. male. 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 Hampton Abbot in March.  He was recently seen in the surgery clinic, complaining of a bulging sensation near the inferior portion of his wound (which had dehisced).  He was sent for a CT scan to rule out a hernia. That scan was performed today and did not show a hernia, but did show recurrent diverticulitis just proximal to his prior anastomosis.  He was contacted by our answering service and advised to present to the ED for admission  Hospital Course: He was admitted to the general surgery service and underwent conservative management with IV ABx. He did well with this and had resolution in his symptoms. Advancement of patient's diet and ambulation were well-tolerated. The remainder of patient's hospital course was essentially unremarkable, and discharge planning was initiated accordingly with patient safely able to be discharged home with appropriate discharge instructions, antibiotics (Cipro + Flagyl x10 days - he developed a rash on Day 2 with Zosyn, not sure true allergic reaction to this but switched as precaution), pain control, and outpatient follow-up after all of his questions were answered to his expressed satisfaction.   Discharge Condition: Good   Physical Examination:  Constitutional: alert, cooperative and no distress  Respiratory: breathing non-labored at rest  Cardiovascular: regular rate and sinus rhythm  Gastrointestinal: soft, non-tender, and non-distended, no rebound/guarding Integumentary:  Midline wound well healed, warm, dry. Previously seen rash resolved   Allergies as of 05/06/2020       Reactions   Shrimp [shellfish Allergy] Hives        Medication List     TAKE these medications    atorvastatin 20 MG tablet Commonly known as: LIPITOR Take 20 mg by mouth daily.   ciprofloxacin 500 MG tablet Commonly known as: Cipro Take 1 tablet (500 mg total) by mouth 2 (two) times daily for 10 days.   dicyclomine 10 MG capsule Commonly known as: BENTYL Take 1 capsule (10 mg total) by mouth 4 (four) times daily -  before meals and at bedtime.   doxycycline 100 MG capsule Commonly known as: VIBRAMYCIN Take 100 mg by mouth daily.   metroNIDAZOLE 500 MG tablet Commonly known as: Flagyl Take 1 tablet (500 mg total) by mouth 3 (three) times daily for 10 days.   ondansetron 4 MG tablet Commonly known as: Zofran Take 1 tablet (4 mg total) by mouth every 8 (eight) hours as needed for nausea or vomiting.   oxyCODONE 5 MG immediate release tablet Commonly known as: Oxy IR/ROXICODONE Take 1 tablet (5 mg total) by mouth every 6 (six) hours as needed for severe pain or breakthrough pain.          Follow-up Information     Piscoya, Jacqulyn Bath, MD. Schedule an appointment as soon as possible for a visit in 1 week(s).   Specialty: General Surgery Why: Hospital follow up, diverticulitis  Contact information: [address] [place] [address] [phone]                  Time spent on discharge management including discussion  of hospital course, clinical condition, outpatient instructions, prescriptions, and follow up with the patient and members of the medical team: >30 minutes  -- Edison Simon , PA-C [facility]  05/06/2020, 8:58 AM [phone] M-F: 7am - 4pm  I agree with the above documentation, exam, and plan, which I have edited where appropriate. Fredirick Maudlin  2:00 PM

## 2020-05-13 ENCOUNTER — Other Ambulatory Visit: Payer: Self-pay

## 2020-05-13 ENCOUNTER — Encounter: Payer: Self-pay | Admitting: Surgery

## 2020-05-13 ENCOUNTER — Ambulatory Visit (INDEPENDENT_AMBULATORY_CARE_PROVIDER_SITE_OTHER): Payer: Medicare Other | Admitting: Surgery

## 2020-05-13 VITALS — BP 110/78 | HR 84 | Temp 98.2°F | Ht 70.0 in | Wt 196.0 lb

## 2020-05-13 DIAGNOSIS — K5732 Diverticulitis of large intestine without perforation or abscess without bleeding: Secondary | ICD-10-CM

## 2020-05-13 NOTE — Progress Notes (Signed)
05/13/2020  History of Present Illness: Russell [name] is a 69 y.o. male presenting for follow up of recent diverticulitis.  He had colovesical fistula takedown and sigmoidectomy on 02/11/20.  He was admitted on 6/13 with acute diverticulitis distal to anastomosis, with also possible ascending diverticulitis and possible splenic flexure as well.  He was treated with cipro/flagyl and was discharged home on 6/16.  He reports that now he's feeling well, without any abdominal pain, nausea, vomiting.  Denies any urinary symptoms.  Past Medical History: Past Medical History:  Diagnosis Date  . Arthritis   . Back pain   . 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 Hyperlipidemia   . Hypertension      Past Surgical History: Past Surgical History:  Procedure Laterality Date  . BACK SURGERY    . COLONOSCOPY WITH PROPOFOL N/A 11/05/2019   Procedure: COLONOSCOPY WITH PROPOFOL;  Surgeon: [name], MD;  Location: [facility] ENDOSCOPY;  Service: Gastroenterology;  Laterality: N/A;  . PARTIAL COLECTOMY N/A 02/11/2020   Procedure: PARTIAL COLECTOMY -- sigmoid;  Surgeon: Olean Ree, MD;  Location: [facility] ORS;  Service: General;  Laterality: N/A;  . SPINE SURGERY    . TAKE DOWN OF INTESTINAL FISTULA N/A 02/11/2020   Procedure: TAKE DOWN OF INTESTINAL FISTULA -- colovesical fistula;  Surgeon: Olean Ree, MD;  Location: [facility] ORS;  Service: General;  Laterality: N/A;  . TRACHEAL SURGERY      Home Medications: Prior to Admission medications   Medication Sig Start Date End Date Taking? Authorizing Provider  atorvastatin (LIPITOR) 20 MG tablet Take 20 mg by mouth daily. 08/20/19  Yes [provider]  ciprofloxacin (CIPRO) 500 MG tablet Take 1 tablet (500 mg total) by mouth 2 (two) times daily for 10 days. 05/06/20 05/16/20 Yes Tylene Fantasia, PA-C  dicyclomine (BENTYL) 10 MG capsule Take 1 capsule (10 mg total) by mouth 4 (four) times daily -  before meals and at bedtime. 04/29/20 07/28/20 Yes [name], MD   doxycycline (VIBRAMYCIN) 100 MG capsule Take 100 mg by mouth daily. 01/03/20  Yes [provider]  metroNIDAZOLE (FLAGYL) 500 MG tablet Take 1 tablet (500 mg total) by mouth 3 (three) times daily for 10 days. 05/06/20 05/16/20 Yes Edison Simon R, PA-C  ondansetron (ZOFRAN) 4 MG tablet Take 1 tablet (4 mg total) by mouth every 8 (eight) hours as needed for nausea or vomiting. 04/29/20  Yes Edison Simon R, PA-C  oxyCODONE (OXY IR/ROXICODONE) 5 MG immediate release tablet Take 1 tablet (5 mg total) by mouth every 6 (six) hours as needed for severe pain or breakthrough pain. 04/29/20  Yes Tylene Fantasia, PA-C    Allergies: Allergies  Allergen Reactions  . Shrimp [Shellfish Allergy] Hives    Review of Systems: Review of Systems  Constitutional: Negative for chills and fever.  Respiratory: Negative for shortness of breath.   Cardiovascular: Negative for chest pain.  Gastrointestinal: Negative for abdominal pain, nausea and vomiting.    Physical Exam BP 110/78   Pulse 84   Temp 98.2 F (36.8 C)   Ht 5\' 10"  (1.778 m)   Wt 196 lb (88.9 kg)   SpO2 96%   BMI 28.12 kg/m  CONSTITUTIONAL: No acute distress HEENT:  Normocephalic, atraumatic, extraocular motion intact. RESPIRATORY:  Lungs are clear, and breath sounds are equal bilaterally. Normal respiratory effort without pathologic use of accessory muscles. CARDIOVASCULAR: Heart is regular without murmurs, gallops, or rubs. GI: The abdomen is soft, non-distended, non-tender.  Incisions are well healed without any evidence  of hernia. NEUROLOGIC:  Motor and sensation is grossly normal.  Cranial nerves are grossly intact. PSYCH:  Alert and oriented to person, place and time. Affect is normal.  Assessment and Plan: This is a 70 y.o. male with diverticulitis.  --Discussed with the patient that he does not have a hernia.  He should continue antibiotic course until completed. He should start a high fiber diet.  At this point, his  entire colon has evidence of diverticulosis, so proceeding with subtotal colectomy would not be recommended.  No surgical needs at this point.  He may follow up as needed.  Face-to-face time spent with the patient and care providers was 15 minutes, with more than 50% of the time spent counseling, educating, and coordinating care of the patient.     Melvyn Neth, [facility]

## 2020-05-13 NOTE — Patient Instructions (Addendum)
Finish you antibiotics.  termina tus antibiticos  Follow-up with our office as needed.  Please call and ask to speak with a nurse if you develop questions or concerns.   Dieta rica en fibra High-Fiber Diet La fibra, tambin llamada fibra dietaria, es un tipo de carbohidrato que se encuentra en las frutas, las verduras, los cereales integrales y los frijoles. Una dieta rica en fibra puede tener muchos beneficios para la salud. El mdico puede recomendar una dieta rica en fibra para ayudar a:  Contractor. La fibra puede hacer que defeque con ms frecuencia.  Disminuir el nivel de colesterol.  Aliviar las siguientes afecciones: ? Hinchazn de la venas en el ano (hemorroides). ? Hinchazn e irritacin (inflamacin) de zonas especficas del tracto digestivo (diverticulitis sin complicaciones). ? Un problema del intestino grueso (colon) que, a veces, causa dolor y diarrea (sndrome de colon irritable, SCI).  Evitar comer en exceso como parte de un plan para bajar de peso.  Evitar la enfermedad cardaca, la diabetes tipo 2 y ciertos cnceres. En qu consiste el plan? El consumo diario recomendado de fibra en gramos (g) incluye lo siguiente:  38g para hombres de 42 aos o menos.  30g para los hombres mayores de 50 aos.  25g para mujeres de 50 aos o menos.  21g para las [employer] de 50 aos. Puede recibir la ingesta diaria recomendada de fibra dietaria de la siguiente manera:  Coma una variedad de frutas, verduras, granos y frijoles.  Tome un suplemento de [place], si no es posible comer suficiente fibra en su dieta. Qu debo saber acerca de la dieta rica en fibra?  Es mejor obtener fibra directamente de los alimentos en lugar de recibirla de suplementos de [place]. No hay demasiada investigacin acerca de qu tan efectivos son los suplementos.  Verifique siempre el contenido de fibra en la etiqueta de informacin nutricional de los alimentos preenvasados.  Busque alimentos que contengan 5g o ms de fibra por porcin.  Hable con un especialista en alimentacin y nutricin (nutricionista) si tiene preguntas sobre alimentos especficos que se recomiendan o no para su afeccin mdica, especialmente si aquellos alimentos no estn detallados a continuacin.  Aumente gradualmente la cantidad de fibra que consume. Si aumenta demasiado rpido el consumo de fibra dietaria puede provocar distensin abdominal, clicos o gases.  Beba abundante agua. El [place] a Economist. Cules son algunos consejos para seguir este plan?  Consuma una gran variedad de alimentos ricos en fibra.  Asegrese de que al [employer] mitad de los granos que ingiere cada da sean cereales integrales.  Coma panes y cereales hechos con harina de cereales integrales en lugar de harina refinada o blanca.  Coma arroz integral, trigo burgol o mijo en lugar de Neurosurgeon con un desayuno rico en fibra, como un cereal que contenga al menos 5g de fibra o ms por porcin.  Use frijoles en lugar de carne en las sopas, ensaladas o platos de pastas.  Coma colaciones ricas en fibra, como frutos rojos, verduras crudas, frutos secos y palomitas de maz.  Elija frutas y verduras Insurance claims handler de formas procesadas como jugo o salsa. Qu alimentos puedo comer?  Frutas Frutos rojos. Peras. Manzanas. Naranjas. [place]. Ciruelas y pasas. Higos secos. Verduras Batatas. [place]. Col rizada. [place]as. Repollo. Brcoli. Coliflor. Guisantes. Zanahorias. [place]. Cereales Panes integrales. Cereal multigrano. Avena. Arroz integral. Dwyane Luo. Trigo burgol. Mijo. Quinua. Magdalenas de salvado. Palomitas de maz. Galletas de centeno. Carnes y  otras protenas Frijoles blancos, colorados y pintos. Soja. Guisantes secos. Lentejas. Frutos secos y semillas. Lcteos Yogur fortificado con Pharmacist, hospital. Bebidas Leche de soja fortificada con Fredderick [place]. Jugo de naranja fortificado con  Fredderick [place]. Otros alimentos Barras de [place]. Es posible que los productos mencionados arriba no formen una lista completa de las bebidas y los alimentos recomendados. Comunquese con un nutricionista para conocer ms opciones. Qu alimentos no se recomiendan? Frutas Jugo de frutas. Frutas cocidas coladas. Verduras Papas fritas. Verduras enlatadas. Verduras muy cocidas. Cereales Pan blanco. Pastas hechas con Letitia Neri. Arroz Kerby Moors y otras protenas Cortes de carne con grasa. Pollo o pescado fritos. Lcteos Leche. Yogur. Queso crema. [employer]. Grasas y [employer]. Bebidas Gaseosas. Otros alimentos Tortas y pasteles. Es posible que los productos que se enumeran ms arriba no sean una lista completa de los alimentos y las bebidas que se Higher education careers adviser. Comunquese con un nutricionista para obtener ms informacin. Resumen  La fibra es un tipo de carbohidrato. Se encuentra en las frutas, las verduras, los cereales integrales y los frijoles.  Una dieta rica en fibra representa muchos beneficios para la salud, como evitar el estreimiento, Management consultant colesterol en la [place], ayudar a perder peso y reducir el riesgo de enfermedad cardaca, diabetes y ciertos tipos de cncer.  Aumente gradualmente la ingesta de fibra. El aumento demasiado rpido puede provocar clicos, distensin abdominal y gases. Beba mucha agua a la vez que [employer]AA.  Las mejores fuentes de fibra incluyen frutas y verduras enteras, granos integrales, frutos secos, semillas y frijoles. Esta informacin no tiene Marine scientist el consejo del mdico. Asegrese de hacerle al mdico cualquier pregunta que tenga. Document Revised: 02/17/2018 Document Reviewed: 10/27/2017 Elsevier Patient Education  [place].

## 2021-01-06 DIAGNOSIS — M1652 Unilateral post-traumatic osteoarthritis, left hip: Secondary | ICD-10-CM | POA: Insufficient documentation

## 2021-01-06 DIAGNOSIS — M1712 Unilateral primary osteoarthritis, left knee: Secondary | ICD-10-CM | POA: Insufficient documentation

## 2021-02-10 DIAGNOSIS — G629 Polyneuropathy, unspecified: Secondary | ICD-10-CM | POA: Insufficient documentation

## 2021-06-17 DIAGNOSIS — R2689 Other abnormalities of gait and mobility: Secondary | ICD-10-CM | POA: Insufficient documentation

## 2021-09-01 ENCOUNTER — Emergency Department
Admission: EM | Admit: 2021-09-01 | Discharge: 2021-09-01 | Disposition: A | Discharge status: Home / Self Care | Payer: Medicare Other | Attending: Emergency Medicine | Admitting: Emergency Medicine

## 2021-09-01 ENCOUNTER — Emergency Department: Payer: Medicare Other

## 2021-09-01 ENCOUNTER — Other Ambulatory Visit: Payer: Self-pay

## 2021-09-01 DIAGNOSIS — I10 Essential (primary) hypertension: Secondary | ICD-10-CM | POA: Diagnosis not present

## 2021-09-01 DIAGNOSIS — Z79899 Other long term (current) drug therapy: Secondary | ICD-10-CM | POA: Diagnosis not present

## 2021-09-01 DIAGNOSIS — X58XXXA Exposure to other specified factors, initial encounter: Secondary | ICD-10-CM | POA: Insufficient documentation

## 2021-09-01 DIAGNOSIS — S39012A Strain of muscle, fascia and tendon of lower back, initial encounter: Secondary | ICD-10-CM | POA: Insufficient documentation

## 2021-09-01 DIAGNOSIS — S3992XA Unspecified injury of lower back, initial encounter: Secondary | ICD-10-CM | POA: Diagnosis present

## 2021-09-01 MED ORDER — LIDOCAINE 5 % EX PTCH: 1.0000 | MEDICATED_PATCH | CUTANEOUS | 0 refills | Status: DC

## 2021-09-01 MED ORDER — LIDOCAINE 5 % EX PTCH
1.0000 | MEDICATED_PATCH | CUTANEOUS | Status: DC
  Administered 2021-09-01: 1 via TRANSDERMAL
  Filled 2021-09-01: qty 1

## 2021-09-01 MED ORDER — DICLOFENAC SODIUM 1 % EX GEL: 2.0000 g | Freq: Four times a day (QID) | CUTANEOUS | 0 refills | Status: DC

## 2021-09-01 NOTE — ED Provider Notes (Signed)
[facility] Emergency Department Provider Note  ____________________________________________  Time seen: Approximately 6:38 PM  I have reviewed the triage vital signs and the nursing notes.   HISTORY  Chief Complaint Back Pain    HPI Russell [name] is a 70 y.o. male with a history of hypertension hyperlipidemia and diverticulitis who comes ED complaining of low back pain that started while he was standing up from eating lunch today.  Pain is constant, nonradiating, worse with movement, no alleviating factors.  Mild intensity.  He notes that his daughters recently came to visit and they played soccer during that visit.  It was much more strenuous activity than he is used to.  Denies any leg paresthesia or weakness.  No difficulty with balance, no falls.  No bowel or bladder retention or incontinence.     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   Hypotension 02/10/2018   Acute blood loss anemia 02/10/2018   S/P spinal fusion 01/17/2018   Other specified abnormal immunological findings in serum 11/27/2017   Postoperative or surgical complication 70/62/3762   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[place]) 11/25/2017   Fluid collection at surgical site 11/25/2017   Delirium due to another medical condition 10/05/2017   BMI 26.0-26.9,adult 06/26/2017   GERD (gastroesophageal reflux disease) 05/09/2017   Chronic cough 11/21/2016   Heterotopic ossification  03/01/2016   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 pylori) infection 02/11/2014   Dyspepsia 02/05/2014   Neck pain 10/11/2013   Pain in joint, pelvic region and thigh 10/11/2013   Low back pain 10/11/2013     Past Surgical History:  Procedure Laterality Date   BACK SURGERY     COLONOSCOPY WITH PROPOFOL N/A 11/05/2019   Procedure: COLONOSCOPY WITH PROPOFOL;  Surgeon: [name], MD;  Location: [facility] ENDOSCOPY;  Service: Gastroenterology;  Laterality: N/A;   PARTIAL COLECTOMY N/A 02/11/2020   Procedure: PARTIAL COLECTOMY -- sigmoid;  Surgeon: Olean Ree, MD;  Location: [facility] ORS;  Service: General;  Laterality: N/A;   SPINE SURGERY     TAKE DOWN OF INTESTINAL FISTULA N/A 02/11/2020   Procedure: TAKE DOWN OF INTESTINAL FISTULA -- colovesical fistula;  Surgeon: Olean Ree, MD;  Location: [facility] ORS;  Service: General;  Laterality: N/A;   TRACHEAL SURGERY       Prior to Admission medications   Medication Sig Start Date End Date Taking? Authorizing Provider  diclofenac Sodium (VOLTAREN) 1 % GEL Apply 2 g topically 4 (four) times daily. 09/01/21  Yes Carrie Mew, MD  lidocaine (LIDODERM) 5 % Place 1 patch onto the skin daily. Remove & Discard patch within 12 hours or as directed by MD 09/01/21  Yes Carrie Mew, MD  atorvastatin (LIPITOR) 20 MG tablet Take 20 mg by mouth daily. 08/20/19   [provider]   dicyclomine (BENTYL) 10 MG capsule Take 1 capsule (10 mg total) by mouth 4 (four) times daily -  before meals and at bedtime. 04/29/20 07/28/20  [name], MD  doxycycline (VIBRAMYCIN) 100 MG capsule Take 100 mg by mouth daily. 01/03/20   [provider]  ondansetron (ZOFRAN) 4 MG tablet Take 1 tablet (4 mg total) by mouth every 8 (eight) hours as needed for nausea or vomiting. 04/29/20   Tylene Fantasia, PA-C  oxyCODONE (OXY IR/ROXICODONE) 5 MG immediate release tablet Take 1 tablet (5 mg total) by mouth every 6 (six) hours as needed for severe pain or breakthrough pain. 04/29/20   Tylene Fantasia, PA-C     Allergies Shrimp [shellfish allergy]   Family History  Problem Relation Age of Onset   Prostate cancer Neg Hx    Bladder Cancer Neg Hx    Kidney cancer Neg Hx     Social History Social History   Tobacco Use   Smoking status: Never   Smokeless tobacco: Never  Vaping Use   Vaping Use: Never used  Substance Use Topics   Alcohol use: No   Drug use: No    Review of Systems  Constitutional:   No fever or chills.  ENT:   No sore throat. No rhinorrhea. Cardiovascular:   No chest pain or syncope. Respiratory:   No dyspnea or cough. Gastrointestinal:   Negative for abdominal pain, vomiting and diarrhea.  Musculoskeletal: Low back pain as above All other systems reviewed and are negative except as documented above in ROS and HPI.  ____________________________________________   PHYSICAL EXAM:  VITAL SIGNS: ED Triage Vitals  Enc Vitals Group     BP 09/01/21 1434 115/80     Pulse Rate 09/01/21 1434 89     Resp 09/01/21 1433 20     Temp 09/01/21 1433 98 F (36.7 C)     Temp Source 09/01/21 1433 Oral     SpO2 09/01/21 1434 97 %     Weight 09/01/21 1434 209 lb (94.8 kg)     Height 09/01/21 1434 5\' 10"  (1.778 m)     Head Circumference --      Peak Flow --      Pain Score 09/01/21 1433 8     Pain Loc --      Pain Edu? --      Excl. in [place]? --     Vital signs  reviewed, nursing assessments reviewed.   Constitutional:   Alert and oriented. Non-toxic appearance. Eyes:   Conjunctivae are normal. EOMI. ENT      Head:   Normocephalic and atraumatic.            Neck:   No meningismus. Full ROM.  Cardiovascular:   RRR. Symmetric bilateral  DP pulses.   Respiratory:   Normal respiratory effort without tachypnea/retractions. Gastrointestinal:   Soft and nontender. Non distended. There is no CVA tenderness.  No rebound, rigidity, or guarding. Musculoskeletal:   Normal range of motion in all extremities.  No edema.  No midline spinal tenderness.  Straight leg raise negative bilaterally.  There is some mild tenderness in the lumbar paraspinous musculature. Neurologic:   Normal speech  and language.  Motor grossly intact. No acute focal neurologic deficits are appreciated.   ____________________________________________    LABS (pertinent positives/negatives) (all labs ordered are listed, but only abnormal results are displayed) Labs Reviewed - No data to display ____________________________________________   EKG  ____________________________________________    RADIOLOGY  DG Lumbar Spine 2-3 Views  Result Date: 09/01/2021 CLINICAL DATA:  Low back pain EXAM: LUMBAR SPINE - 2-3 VIEW COMPARISON:  02/07/2018 FINDINGS: Normal lumbar alignment. Negative for acute fracture. Mild chronic fracture L1 unchanged. Posterior pedicle screw and rod fusion in the thoracic spine, partially imaged Embolization coils in the region of the left lumbar arteries. IVC filter at the L2 level. Interval fracture of a leg to the posterior IVC filter. IMPRESSION: Negative for acute fracture lumbar spine IVC filter unchanged in position. Interval fracture of a leg to the filter. Electronically Signed   By: Franchot Gallo M.D.   On: 09/01/2021 17:47     ____________________________________________   PROCEDURES Procedures  ____________________________________________  CLINICAL IMPRESSION / ASSESSMENT AND PLAN / ED COURSE  Pertinent labs & imaging results that were available during my care of the patient were reviewed by me and considered in my medical decision making (see chart for details).  Russell [name] was evaluated in Emergency Department on 09/01/2021 for the symptoms described in the history of present illness. He was evaluated in the context of the global COVID-19 pandemic, which necessitated consideration that the patient might be at risk for infection with the SARS-CoV-2 virus that causes COVID-19. Institutional protocols and algorithms that pertain to the evaluation of patients at risk for COVID-19 are in a state of rapid change based on information released by regulatory bodies including the CDC and federal and state organizations. These policies and algorithms were followed during the patient's care in the ED.   Patient presents with low back pain, highly likely to be muscle strain due to overexertion.  Vital signs are normal, exam is benign.  Doubt osteomyelitis, epidural abscess or central cord syndrome.  Doubt aortic dissection or aneurysm.  No evidence of soft tissue infection.  X-ray viewed and interpreted by me, appears normal.  Radiology report reviewed.  Will treat with Voltaren gel and lidocaine patch and follow-up with primary care.      ____________________________________________   FINAL CLINICAL IMPRESSION(S) / ED DIAGNOSES    Final diagnoses:  Strain of lumbar region, initial encounter     ED Discharge Orders          Ordered    lidocaine (LIDODERM) 5 %  Every 24 hours        09/01/21 1837    diclofenac Sodium (VOLTAREN) 1 % GEL  4 times daily        09/01/21 1837            Portions of this note were generated with dragon dictation software. Dictation errors may occur despite best attempts  at proofreading.   Carrie Mew, MD 09/01/21 [phone]

## 2021-09-01 NOTE — ED Notes (Signed)
Pt states that approx 2 hours ago he started having pain to lower mid portion of his back.

## 2021-09-01 NOTE — ED Notes (Signed)
Pt to XR

## 2021-09-01 NOTE — ED Triage Notes (Signed)
Pt to ED for lower back pain that started when got up from dining room table today. Hx back problems Ambulatory with cane. NAD noted

## 2021-09-05 DIAGNOSIS — E785 Hyperlipidemia, unspecified: Secondary | ICD-10-CM | POA: Insufficient documentation

## 2021-09-22 ENCOUNTER — Ambulatory Visit (INDEPENDENT_AMBULATORY_CARE_PROVIDER_SITE_OTHER): Payer: Medicare Other | Admitting: Surgery

## 2021-09-22 ENCOUNTER — Encounter: Payer: Self-pay | Admitting: Surgery

## 2021-09-22 ENCOUNTER — Other Ambulatory Visit: Payer: Self-pay

## 2021-09-22 VITALS — BP 99/50 | HR 93 | Temp 98.0°F | Ht 70.0 in | Wt 210.6 lb

## 2021-09-22 DIAGNOSIS — M25569 Pain in unspecified knee: Secondary | ICD-10-CM | POA: Insufficient documentation

## 2021-09-22 DIAGNOSIS — N321 Vesicointestinal fistula: Secondary | ICD-10-CM

## 2021-09-22 NOTE — Progress Notes (Signed)
09/22/2021  History of Present Illness: Russell [name] is a 70 y.o. male s/p open sigmoidectomy with colovesical fistula takedown on 02/11/20.  He had a subsequent episode of diverticulitis in 04/2020 which was treated conservatively.  He presents today for follow up.  Denies any abdominal pain, nausea, vomiting.  Denies any dysuria, fecaluria, or pneumaturia.  Denies any constipation or diarrhea.  He reports having issues with his right hip and right knee.  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   Hyperlipidemia    Hypertension      Past Surgical History: Past Surgical History:  Procedure Laterality Date   BACK SURGERY     COLONOSCOPY WITH PROPOFOL N/A 11/05/2019   Procedure: COLONOSCOPY WITH PROPOFOL;  Surgeon: [name], MD;  Location: [facility] ENDOSCOPY;  Service: Gastroenterology;  Laterality: N/A;   PARTIAL COLECTOMY N/A 02/11/2020   Procedure: PARTIAL COLECTOMY -- sigmoid;  Surgeon: Olean Ree, MD;  Location: [facility] ORS;  Service: General;  Laterality: N/A;   SPINE SURGERY     TAKE DOWN OF INTESTINAL FISTULA N/A 02/11/2020   Procedure: TAKE DOWN OF INTESTINAL FISTULA -- colovesical fistula;  Surgeon: Olean Ree, MD;  Location: [facility] ORS;  Service: General;  Laterality: N/A;   TRACHEAL SURGERY      Home Medications: Prior to Admission medications   Not on File    Allergies: Allergies  Allergen Reactions   Penicillins Rash    Beta-lactam allergy, DRESS Beta-lactam allergy, DRESS    Shrimp [Shellfish Allergy] Hives   Cisatracurium Other (See Comments)    Other reaction(s): Unknown Other reaction(s): Unknown    Heparin Other (See Comments)    Other reaction(s): Unknown Other reaction(s): Unknown    Rocuronium Other (See Comments)    Other reaction(s): Unknown Other reaction(s): Unknown    Vancomycin Other (See Comments)    Other reaction(s): Unknown Other reaction(s): Unknown      Review of Systems: Review of Systems  Constitutional:  Negative for fever.  Respiratory:  Negative for shortness of breath.   Cardiovascular:  Negative for chest pain.  Gastrointestinal:  Negative for abdominal pain, constipation, diarrhea, nausea and vomiting.  Genitourinary:  Negative for dysuria.  Musculoskeletal:  Positive for joint pain.   Physical Exam BP (!) 99/50   Pulse 93   Temp 98 F (36.7 C) (Oral)   Ht 5\' 10"  (1.778 m)   Wt 210 lb 9.6 oz (95.5 kg)   SpO2 94%   BMI 30.22 kg/m  CONSTITUTIONAL: No acute distress, well nourished. HEENT:  Normocephalic, atraumatic, extraocular motion intact. RESPIRATORY:  Normal respiratory effort without pathologic use of accessory muscles. CARDIOVASCULAR: Regular rhythm and rate. GI: The abdomen is soft, non-distended, non-tender to palpation.  Midline incision is well healed, without evidence of hernia.  NEUROLOGIC:  Motor and sensation is grossly normal.  Cranial nerves are grossly intact. PSYCH:  Alert and oriented to person, place and time. Affect is normal.   Assessment and Plan: This is a 70 y.o. male with history of sigmoidectomy and colovesical fistula takedown.  --Patient is doing well, without any abdominal complaints.  His main issues are orthopedic and I recommended that he follow up with his orthopedic surgeon to see if anything further can be done to help. --From surgical /abdominal standpoint, no current issues and he may follow up as needed.  I spent 15 minutes dedicated to the care of this patient on the date of this  encounter to include pre-visit review of records, face-to-face time with the patient discussing diagnosis and management, and any post-visit coordination of care.   Melvyn Neth, [facility]

## 2021-09-22 NOTE — Patient Instructions (Signed)
Please call our office if you have any questions or concerns.  

## 2022-04-06 ENCOUNTER — Inpatient Hospital Stay
Admission: EM | Admit: 2022-04-06 | Discharge: 2022-04-09 | DRG: 871 | Disposition: A | Discharge status: Home / Self Care | Payer: Medicare Other | Attending: Internal Medicine | Admitting: Internal Medicine

## 2022-04-06 ENCOUNTER — Emergency Department: Payer: Medicare Other

## 2022-04-06 ENCOUNTER — Other Ambulatory Visit: Payer: Self-pay

## 2022-04-06 DIAGNOSIS — W07XXXA Fall from chair, initial encounter: Secondary | ICD-10-CM | POA: Diagnosis present

## 2022-04-06 DIAGNOSIS — R4182 Altered mental status, unspecified: Principal | ICD-10-CM

## 2022-04-06 DIAGNOSIS — E782 Mixed hyperlipidemia: Secondary | ICD-10-CM | POA: Diagnosis present

## 2022-04-06 DIAGNOSIS — I10 Essential (primary) hypertension: Secondary | ICD-10-CM | POA: Diagnosis present

## 2022-04-06 DIAGNOSIS — A4902 Methicillin resistant Staphylococcus aureus infection, unspecified site: Secondary | ICD-10-CM | POA: Diagnosis present

## 2022-04-06 DIAGNOSIS — A045 Campylobacter enteritis: Secondary | ICD-10-CM | POA: Diagnosis present

## 2022-04-06 DIAGNOSIS — G9340 Encephalopathy, unspecified: Secondary | ICD-10-CM | POA: Diagnosis not present

## 2022-04-06 DIAGNOSIS — Z881 Allergy status to other antibiotic agents status: Secondary | ICD-10-CM

## 2022-04-06 DIAGNOSIS — M1652 Unilateral post-traumatic osteoarthritis, left hip: Secondary | ICD-10-CM | POA: Diagnosis present

## 2022-04-06 DIAGNOSIS — I82401 Acute embolism and thrombosis of unspecified deep veins of right lower extremity: Secondary | ICD-10-CM | POA: Diagnosis present

## 2022-04-06 DIAGNOSIS — I82409 Acute embolism and thrombosis of unspecified deep veins of unspecified lower extremity: Secondary | ICD-10-CM | POA: Diagnosis present

## 2022-04-06 DIAGNOSIS — G629 Polyneuropathy, unspecified: Secondary | ICD-10-CM

## 2022-04-06 DIAGNOSIS — E663 Overweight: Secondary | ICD-10-CM | POA: Diagnosis present

## 2022-04-06 DIAGNOSIS — G9341 Metabolic encephalopathy: Secondary | ICD-10-CM | POA: Diagnosis present

## 2022-04-06 DIAGNOSIS — Z85828 Personal history of other malignant neoplasm of skin: Secondary | ICD-10-CM

## 2022-04-06 DIAGNOSIS — A419 Sepsis, unspecified organism: Principal | ICD-10-CM | POA: Diagnosis present

## 2022-04-06 DIAGNOSIS — Z20822 Contact with and (suspected) exposure to covid-19: Secondary | ICD-10-CM | POA: Diagnosis present

## 2022-04-06 DIAGNOSIS — K219 Gastro-esophageal reflux disease without esophagitis: Secondary | ICD-10-CM | POA: Diagnosis present

## 2022-04-06 DIAGNOSIS — Z880 Allergy status to penicillin: Secondary | ICD-10-CM

## 2022-04-06 DIAGNOSIS — E785 Hyperlipidemia, unspecified: Secondary | ICD-10-CM | POA: Diagnosis present

## 2022-04-06 DIAGNOSIS — Z9049 Acquired absence of other specified parts of digestive tract: Secondary | ICD-10-CM

## 2022-04-06 DIAGNOSIS — R55 Syncope and collapse: Secondary | ICD-10-CM

## 2022-04-06 DIAGNOSIS — Z91013 Allergy to seafood: Secondary | ICD-10-CM

## 2022-04-06 DIAGNOSIS — Z981 Arthrodesis status: Secondary | ICD-10-CM

## 2022-04-06 DIAGNOSIS — Z86711 Personal history of pulmonary embolism: Secondary | ICD-10-CM

## 2022-04-06 DIAGNOSIS — Z6829 Body mass index (BMI) 29.0-29.9, adult: Secondary | ICD-10-CM

## 2022-04-06 DIAGNOSIS — B9562 Methicillin resistant Staphylococcus aureus infection as the cause of diseases classified elsewhere: Secondary | ICD-10-CM | POA: Diagnosis present

## 2022-04-06 DIAGNOSIS — Z888 Allergy status to other drugs, medicaments and biological substances status: Secondary | ICD-10-CM

## 2022-04-06 DIAGNOSIS — I82412 Acute embolism and thrombosis of left femoral vein: Secondary | ICD-10-CM | POA: Diagnosis present

## 2022-04-06 DIAGNOSIS — B9620 Unspecified Escherichia coli [E. coli] as the cause of diseases classified elsewhere: Secondary | ICD-10-CM | POA: Diagnosis present

## 2022-04-06 LAB — COMPREHENSIVE METABOLIC PANEL
ALT: 15 U/L (ref 0–44)
AST: 21 U/L (ref 15–41)
Albumin: 3.8 g/dL (ref 3.5–5.0)
Alkaline Phosphatase: 67 U/L (ref 38–126)
Anion gap: 9 (ref 5–15)
BUN: 23 mg/dL (ref 8–23)
CO2: 19 mmol/L — ABNORMAL LOW (ref 22–32)
Calcium: 8.8 mg/dL — ABNORMAL LOW (ref 8.9–10.3)
Chloride: 109 mmol/L (ref 98–111)
Creatinine, Ser: 1.21 mg/dL (ref 0.61–1.24)
GFR, Estimated: >60 mL/min (ref >60)
Glucose, Bld: 172 mg/dL — ABNORMAL HIGH (ref 70–99)
Potassium: 3.6 mmol/L (ref 3.5–5.1)
Sodium: 137 mmol/L (ref 135–145)
Total Bilirubin: 0.9 mg/dL (ref 0.3–1.2)
Total Protein: 6.7 g/dL (ref 6.5–8.1)

## 2022-04-06 LAB — CBC
HCT: 41.9 % (ref 39.0–52.0)
Hemoglobin: 13.7 g/dL (ref 13.0–17.0)
MCH: 30 pg (ref 26.0–34.0)
MCHC: 32.7 g/dL (ref 30.0–36.0)
MCV: 91.9 fL (ref 80.0–100.0)
Platelets: 167 10*3/uL (ref 150–400)
RBC: 4.56 MIL/uL (ref 4.22–5.81)
RDW: 13.6 % (ref 11.5–15.5)
WBC: 10.8 10*3/uL — ABNORMAL HIGH (ref 4.0–10.5)
nRBC: 0 % (ref 0.0–0.2)

## 2022-04-06 LAB — TROPONIN I (HIGH SENSITIVITY): Troponin I (High Sensitivity): 11 ng/L (ref <18)

## 2022-04-06 MED ORDER — SODIUM CHLORIDE 0.9 % IV BOLUS
500.0000 mL | Freq: Once | INTRAVENOUS | Status: AC
  Administered 2022-04-07: 500 mL via INTRAVENOUS

## 2022-04-06 NOTE — ED Provider Notes (Signed)
[facility] Provider Note    Event Date/Time   First MD Initiated Contact with Patient 04/06/22 2310     (approximate)   History   Leg Pain  History obtained via patient, his wife and daughter  HPI  Russell [name] is a 71 y.o. male brought to the ED from home by his family with a chief complaint of altered mentation, dizziness and fall.  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  Denies injury or trauma prior to noticing swelling and discoloration.  Daughter states that subsided.  Yesterday morning he was at [facility] to have Mohs surgery.  Sometime in the afternoon he went to his car and drove off despite instructions to stay at the clinic.  He called his daughter who noted he sounded confused.  Subsequently he became dizzy, had a near syncopal episode and started to fall from a chair but was caught by clinic staff members.  Daughter and spouse noticed increasing confusion of the past several days.  Patient endorses left hip pain which began prior to slipping from the chair.  He denies striking the ground or striking his head.  Denies recent fever, cough, chest pain, shortness of breath, abdominal pain, nausea or vomiting.  Family denies slurred speech, facial droop.     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   Carpal joint sprain 09/11/2019   Cervical spondylosis without myelopathy 09/11/2019   DDD (degenerative disc disease), cervical 09/11/2019   Lumbosacral stenosis 08/30/2019   Degeneration of lumbosacral intervertebral disc 08/30/2019   Unilateral vocal cord paralysis 08/03/2018   Hypotension 02/10/2018   Acute blood loss anemia 02/10/2018   S/P spinal fusion 01/17/2018   Other specified abnormal immunological findings in serum 11/27/2017   Postoperative or surgical complication 30/16/0109   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[place]) 11/25/2017   Fluid collection at surgical site 11/25/2017   Delirium due to another medical condition 10/05/2017   BMI 26.0-26.9,adult 06/26/2017   GERD (gastroesophageal reflux disease) 05/09/2017   Chronic cough 11/21/2016   Heterotopic ossification 03/01/2016   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 pylori) infection 02/11/2014   Dyspepsia 02/05/2014   Neck pain 10/11/2013   Pain in joint, pelvic region and thigh 10/11/2013   Low back pain  10/11/2013     Past Surgical History   Past Surgical History:  Procedure Laterality Date   BACK SURGERY     COLONOSCOPY WITH PROPOFOL N/A 11/05/2019   Procedure: COLONOSCOPY WITH PROPOFOL;  Surgeon: [name], MD;  Location: [facility] ENDOSCOPY;  Service: Gastroenterology;   Laterality: N/A;   PARTIAL COLECTOMY N/A 02/11/2020   Procedure: PARTIAL COLECTOMY -- sigmoid;  Surgeon: Olean Ree, MD;  Location: [facility] ORS;  Service: General;  Laterality: N/A;   SPINE SURGERY     TAKE DOWN OF INTESTINAL FISTULA N/A 02/11/2020   Procedure: TAKE DOWN OF INTESTINAL FISTULA -- colovesical fistula;  Surgeon: Olean Ree, MD;  Location: [facility] ORS;  Service: General;  Laterality: N/A;   TRACHEAL SURGERY       Home Medications   Prior to Admission medications   Not on File     Allergies  Penicillins, Shrimp [shellfish allergy], Cisatracurium, Heparin, Rocuronium, and Vancomycin   Family History   Family History  Problem Relation Age of Onset   Prostate cancer Neg Hx    Bladder Cancer Neg Hx    Kidney cancer Neg Hx      Physical Exam  Triage Vital Signs: ED Triage Vitals  Enc Vitals Group     BP 04/06/22 1951 106/68     Pulse Rate 04/06/22 1951 (!) 113     Resp 04/06/22 1951 20     Temp 04/06/22 1951 99.6 F (37.6 C)     Temp Source 04/06/22 1951 Oral     SpO2 04/06/22 1951 93 %     Weight --      Height --      Head Circumference --      Peak Flow --      Pain Score 04/06/22 1952 5     Pain Loc --      Pain Edu? --      Excl. in [place]? --     Updated Vital Signs: BP 99/65   Pulse 88   Temp 99.6 F (37.6 C) (Oral)   Resp 18   SpO2 96%    General: Awake, no distress.  CV:  RRR.  Good peripheral perfusion.  Resp:  Normal effort.  CTA B. Abd:  Nontender.  No distention.  Other:  No carotid bruits.  Alert and oriented x3.  CN II toXII grossly intact.  5/5 motor strength and sensation all extremities. MAEx4.  Entire spine examined: Nontender, no erythema or fluctuance.  Left hip nontender with full range of motion.  2+ femoral and distal pulses.  Left leg symmetrically warm compared with the right.  Calf is supple, not swollen and not tender to palpation.  2+ palpable pulses.  Brisk, less than 5-second capillary refill.   ED Results /  Procedures / Treatments  Labs (all labs ordered are listed, but only abnormal results are displayed) Labs Reviewed  CBC - Abnormal; Notable for the following components:      Result Value   WBC 10.8 (*)    All other components within normal limits  COMPREHENSIVE METABOLIC PANEL - Abnormal; Notable for the following components:   CO2 19 (*)    Glucose, Bld 172 (*)    Calcium 8.8 (*)    All other components within normal limits  CULTURE, BLOOD (ROUTINE X 2)  CULTURE, BLOOD (ROUTINE X 2)  URINE CULTURE  RESP PANEL BY RT-PCR (FLU A&B, COVID) ARPGX2  LACTIC ACID, PLASMA  URINALYSIS, ROUTINE W REFLEX MICROSCOPIC  TROPONIN I (HIGH SENSITIVITY)  TROPONIN I (HIGH SENSITIVITY)     EKG  ED ECG REPORT I, Lulani Bour J, the attending physician, personally viewed and interpreted this ECG.   Date: 04/06/2022  EKG Time: 1959  Rate: 106  Rhythm: sinus tachycardia  Axis: Normal  Intervals:none  ST&T Change: Nonspecific    RADIOLOGY I have independently visualized and interpreted patient's CT and x-rays as well as noted the radiology interpretation:  CT head: No ICH  Chest x-ray: Atelectasis versus infiltrate  Left hip x-ray: No acute osseous abnormality  Left ankle x-ray: No acute osseous abnormality  Official radiology report(s): DG Ankle Complete Left  Result Date: 04/06/2022 CLINICAL DATA:  Left ankle pain and swelling, initial encounter EXAM: LEFT ANKLE COMPLETE - 3+ VIEW COMPARISON:  None Available. FINDINGS: Tarsal degenerative changes are noted. No acute fracture or dislocation is seen. No soft tissue abnormality is noted. IMPRESSION: Degenerative change without acute abnormality. Electronically Signed   By: Inez Catalina M.D.   On: 04/06/2022 20:41   CT Head Wo Contrast  Result Date: 04/07/2022 CLINICAL DATA:  Altered mental status EXAM: CT HEAD WITHOUT CONTRAST TECHNIQUE: Contiguous axial images were obtained from the base of the skull through the vertex without  intravenous contrast. RADIATION DOSE REDUCTION: This exam was performed according to the departmental dose-optimization program which includes automated exposure control, adjustment of the mA and/or kV according to patient size and/or use of iterative reconstruction technique. COMPARISON:  None Available. FINDINGS: Brain: There is no mass, hemorrhage or extra-axial collection. The size and configuration of the ventricles and extra-axial CSF spaces are normal. There is hypoattenuation of the white matter, most commonly indicating chronic small vessel disease. Vascular: No abnormal hyperdensity of the major intracranial arteries or dural venous sinuses. No intracranial atherosclerosis. Skull: The visualized skull base, calvarium and extracranial soft tissues are normal. Sinuses/Orbits: No fluid levels or advanced mucosal thickening of the visualized paranasal sinuses. No mastoid or middle ear effusion. The orbits are normal. IMPRESSION: Chronic small vessel disease without acute intracranial abnormality. Electronically Signed   By: Ulyses Jarred M.D.   On: 04/07/2022 00:36   DG Chest Port 1 View  Result Date: 04/07/2022 CLINICAL DATA:  Altered mental status EXAM: PORTABLE CHEST 1 VIEW COMPARISON:  None Available. FINDINGS: Shallow lung inflation with mild interstitial opacity and mild cardiomegaly. No sizable pleural effusion. Long segment thoracic spinal fusion. IMPRESSION: Shallow lung inflation and mild cardiomegaly. Mild interstitial opacity could indicate infection or pulmonary edema. Electronically Signed   By: Ulyses Jarred M.D.   On: 04/07/2022 00:33   DG Hip Unilat W or Wo Pelvis 2-3 Views Left  Result Date: 04/06/2022 CLINICAL DATA:  Left hip pain. EXAM: DG HIP (WITH OR WITHOUT PELVIS) 2-3V LEFT COMPARISON:  CT abdomen pelvis dated 05/01/2020. FINDINGS: There is no acute fracture or dislocation. The bones are osteopenic. Old healed fracture deformity of the left femoral neck with a large heterotopic  ossification as seen on the prior CT. Left femoral neck fixation hardware. The hardware is intact. No evidence of loosening. The soft tissues are unremarkable. IMPRESSION: 1. No acute fracture or dislocation. 2. Chronic changes and prior ORIF. Electronically Signed   By: Anner Crete M.D.   On: 04/06/2022 20:41     PROCEDURES:  Critical Care performed: No  .1-3 Lead EKG Interpretation Performed by: Paulette Blanch, MD Authorized by: Paulette Blanch, MD     Interpretation: normal     ECG rate:  99   ECG rate assessment: normal  Rhythm: sinus rhythm     Ectopy: none     Conduction: normal   Comments:     Patient placed on cardiac monitor to evaluate for arrhythmias  NIH Stroke Scale  Interval: Baseline Time: 12:40 AM Person Administering Scale: Brittin Janik J  Administer stroke scale items in the order listed. Record performance in each category after each subscale exam. Do not go back and change scores. Follow directions provided for each exam technique. Scores should reflect what the patient does, not what the clinician thinks the patient can do. The clinician should record answers while administering the exam and work quickly. Except where indicated, the patient should not be coached (i.e., repeated requests to patient to make a special effort).   1a  Level of consciousness: 0=alert; keenly responsive  1b. LOC questions:  0=Performs both tasks correctly  1c. LOC commands: 0=Performs both tasks correctly  2.  Best Gaze: 0=normal  3.  Visual: 0=No visual loss  4. Facial Palsy: 0=Normal symmetric movement  5a.  Motor left arm: 0=No drift, limb holds 90 (or 45) degrees for full 10 seconds  5b.  Motor right arm: 0=No drift, limb holds 90 (or 45) degrees for full 10 seconds  6a. motor left leg: 0=No drift, limb holds 90 (or 45) degrees for full 10 seconds  6b  Motor right leg:  0=No drift, limb holds 90 (or 45) degrees for full 10 seconds  7. Limb Ataxia: 0=Absent  8.  Sensory:  0=Normal; no sensory loss  9. Best Language:  0=No aphasia, normal  10. Dysarthria: 0=Normal  11. Extinction and Inattention: 0=No abnormality  12. Distal motor function: 0=Normal   Total:   0     MEDICATIONS ORDERED IN ED: Medications  sodium chloride 0.9 % bolus 500 mL (500 mLs Intravenous New Bag/Given 04/07/22 0001)     IMPRESSION / MDM / ASSESSMENT AND PLAN / ED COURSE  I reviewed the triage vital signs and the nursing notes.                             71 year old male presenting with altered mentation, near syncope, dizziness.  I have identified this patient to have a potentially life-threatening illness. Differential diagnosis includes, but is not limited to, alcohol, illicit or prescription medications, or other toxic ingestion; intracranial pathology such as stroke or intracerebral hemorrhage; fever or infectious causes including sepsis; hypoxemia and/or hypercarbia; uremia; trauma; endocrine related disorders such as diabetes, hypoglycemia, CVA, TIA and thyroid-related diseases; hypertensive encephalopathy; etc.   I have personally reviewed patient's records and see his dermatology office visit for basal cell carcinoma from today's visit.  The patient is on the cardiac monitor to evaluate for evidence of arrhythmia and/or significant heart rate changes.  Laboratory results demonstrate mild leukocytosis WBC 10.8, normal electrolytes, initial negative troponin.  X-rays of left ankle and hip unremarkable for acute osseous injury.  Patient is noted to have low-grade temperature and mild tachycardia upon first arrival.  Will obtain blood cultures, lactic acid, chest x-ray, urine, CT head, respiratory panel.  Initiate IV hydration.  Left lower leg exam clinically not concerning for acute limb ischemia.  Clinical Course as of 04/07/22 0040  Thu Apr 07, 2022  0039 Lactic acid is negative.  CT head unremarkable for ICH.  Chest x-ray atelectasis versus infiltrate.  Patient does not give  history of cough; additionally lactic acid is negative so we will hold off antibiotics for now.  Will consult hospital services for evaluation and admission. [JS]    Clinical Course User Index [JS] Paulette Blanch, MD     FINAL CLINICAL IMPRESSION(S) / ED DIAGNOSES   Final diagnoses:  Altered mental status, unspecified altered mental status type  Near syncope     Rx / DC Orders   ED Discharge Orders     None        Note:  This document was prepared using Dragon voice recognition software and may include unintentional dictation errors.   Paulette Blanch, MD 04/07/22 [phone]

## 2022-04-06 NOTE — ED Triage Notes (Signed)
Pt presents to ER with daughter.  Pt c/o swelling/discoloration to left foot that was noticed first on Sunday.  Pt is currently denying pain to left calf and upper left leg on palpation, but states he is having some pain to left hip on palpation.  No recent falls per daughter.  Pt has been having increased confusion over last few days as well.  No swelling or discoloration noted on assessment of leg.  No blood thinners per pt.  Pt A&O x4 and in NAD at this time.   ?

## 2022-04-07 ENCOUNTER — Inpatient Hospital Stay: Payer: Medicare Other

## 2022-04-07 ENCOUNTER — Inpatient Hospital Stay (HOSPITAL_COMMUNITY)
Admit: 2022-04-07 | Discharge: 2022-04-07 | Disposition: A | Discharge status: Home / Self Care | Payer: Medicare Other | Attending: Family Medicine | Admitting: Family Medicine

## 2022-04-07 ENCOUNTER — Emergency Department: Payer: Medicare Other

## 2022-04-07 DIAGNOSIS — Z91013 Allergy to seafood: Secondary | ICD-10-CM | POA: Diagnosis not present

## 2022-04-07 DIAGNOSIS — Z9049 Acquired absence of other specified parts of digestive tract: Secondary | ICD-10-CM | POA: Diagnosis not present

## 2022-04-07 DIAGNOSIS — I10 Essential (primary) hypertension: Secondary | ICD-10-CM | POA: Diagnosis present

## 2022-04-07 DIAGNOSIS — Z6829 Body mass index (BMI) 29.0-29.9, adult: Secondary | ICD-10-CM | POA: Diagnosis not present

## 2022-04-07 DIAGNOSIS — I82412 Acute embolism and thrombosis of left femoral vein: Secondary | ICD-10-CM | POA: Diagnosis present

## 2022-04-07 DIAGNOSIS — B9562 Methicillin resistant Staphylococcus aureus infection as the cause of diseases classified elsewhere: Secondary | ICD-10-CM | POA: Diagnosis present

## 2022-04-07 DIAGNOSIS — A045 Campylobacter enteritis: Secondary | ICD-10-CM

## 2022-04-07 DIAGNOSIS — I824Y3 Acute embolism and thrombosis of unspecified deep veins of proximal lower extremity, bilateral: Secondary | ICD-10-CM | POA: Diagnosis not present

## 2022-04-07 DIAGNOSIS — Z981 Arthrodesis status: Secondary | ICD-10-CM | POA: Diagnosis not present

## 2022-04-07 DIAGNOSIS — K219 Gastro-esophageal reflux disease without esophagitis: Secondary | ICD-10-CM | POA: Diagnosis present

## 2022-04-07 DIAGNOSIS — G9341 Metabolic encephalopathy: Secondary | ICD-10-CM | POA: Diagnosis present

## 2022-04-07 DIAGNOSIS — R509 Fever, unspecified: Secondary | ICD-10-CM | POA: Insufficient documentation

## 2022-04-07 DIAGNOSIS — G9340 Encephalopathy, unspecified: Secondary | ICD-10-CM | POA: Diagnosis present

## 2022-04-07 DIAGNOSIS — E782 Mixed hyperlipidemia: Secondary | ICD-10-CM | POA: Diagnosis present

## 2022-04-07 DIAGNOSIS — A419 Sepsis, unspecified organism: Secondary | ICD-10-CM | POA: Diagnosis present

## 2022-04-07 DIAGNOSIS — G459 Transient cerebral ischemic attack, unspecified: Secondary | ICD-10-CM | POA: Diagnosis not present

## 2022-04-07 DIAGNOSIS — I82401 Acute embolism and thrombosis of unspecified deep veins of right lower extremity: Secondary | ICD-10-CM | POA: Diagnosis present

## 2022-04-07 DIAGNOSIS — E785 Hyperlipidemia, unspecified: Secondary | ICD-10-CM

## 2022-04-07 DIAGNOSIS — E663 Overweight: Secondary | ICD-10-CM | POA: Diagnosis present

## 2022-04-07 DIAGNOSIS — I82409 Acute embolism and thrombosis of unspecified deep veins of unspecified lower extremity: Secondary | ICD-10-CM | POA: Diagnosis present

## 2022-04-07 DIAGNOSIS — Z20822 Contact with and (suspected) exposure to covid-19: Secondary | ICD-10-CM | POA: Diagnosis present

## 2022-04-07 DIAGNOSIS — Z888 Allergy status to other drugs, medicaments and biological substances status: Secondary | ICD-10-CM | POA: Diagnosis not present

## 2022-04-07 DIAGNOSIS — Z85828 Personal history of other malignant neoplasm of skin: Secondary | ICD-10-CM | POA: Diagnosis not present

## 2022-04-07 DIAGNOSIS — Z86711 Personal history of pulmonary embolism: Secondary | ICD-10-CM | POA: Diagnosis not present

## 2022-04-07 DIAGNOSIS — G629 Polyneuropathy, unspecified: Secondary | ICD-10-CM | POA: Diagnosis present

## 2022-04-07 DIAGNOSIS — W07XXXA Fall from chair, initial encounter: Secondary | ICD-10-CM | POA: Diagnosis present

## 2022-04-07 DIAGNOSIS — I82432 Acute embolism and thrombosis of left popliteal vein: Secondary | ICD-10-CM | POA: Diagnosis not present

## 2022-04-07 DIAGNOSIS — Z880 Allergy status to penicillin: Secondary | ICD-10-CM | POA: Diagnosis not present

## 2022-04-07 DIAGNOSIS — B9620 Unspecified Escherichia coli [E. coli] as the cause of diseases classified elsewhere: Secondary | ICD-10-CM | POA: Diagnosis present

## 2022-04-07 DIAGNOSIS — Z881 Allergy status to other antibiotic agents status: Secondary | ICD-10-CM | POA: Diagnosis not present

## 2022-04-07 DIAGNOSIS — I2699 Other pulmonary embolism without acute cor pulmonale: Secondary | ICD-10-CM | POA: Insufficient documentation

## 2022-04-07 DIAGNOSIS — M1652 Unilateral post-traumatic osteoarthritis, left hip: Secondary | ICD-10-CM | POA: Diagnosis present

## 2022-04-07 DIAGNOSIS — A4902 Methicillin resistant Staphylococcus aureus infection, unspecified site: Secondary | ICD-10-CM

## 2022-04-07 LAB — LIPID PANEL
Cholesterol: 121 mg/dL (ref 0–200)
HDL: 32 mg/dL — ABNORMAL LOW (ref >40)
LDL Cholesterol: 78 mg/dL (ref 0–99)
Total CHOL/HDL Ratio: 3.8 RATIO
Triglycerides: 53 mg/dL (ref <150)
VLDL: 11 mg/dL (ref 0–40)

## 2022-04-07 LAB — ECHOCARDIOGRAM LIMITED BUBBLE STUDY
AR max vel: 2.87 cm2
AV Area VTI: 3.31 cm2
AV Area mean vel: 3.05 cm2
AV Mean grad: 8 mmHg
AV Peak grad: 15.5 mmHg
Ao pk vel: 1.97 m/s
Area-P 1/2: 8.25 cm2
MV VTI: 4.04 cm2
S' Lateral: 2.5 cm

## 2022-04-07 LAB — CBC
HCT: 41.6 % (ref 39.0–52.0)
Hemoglobin: 13.5 g/dL (ref 13.0–17.0)
MCH: 29.9 pg (ref 26.0–34.0)
MCHC: 32.5 g/dL (ref 30.0–36.0)
MCV: 92.2 fL (ref 80.0–100.0)
Platelets: 147 10*3/uL — ABNORMAL LOW (ref 150–400)
RBC: 4.51 MIL/uL (ref 4.22–5.81)
RDW: 13.8 % (ref 11.5–15.5)
WBC: 9.9 10*3/uL (ref 4.0–10.5)
nRBC: 0 % (ref 0.0–0.2)

## 2022-04-07 LAB — URINALYSIS, ROUTINE W REFLEX MICROSCOPIC
Bacteria, UA: NONE SEEN
Bilirubin Urine: NEGATIVE
Glucose, UA: NEGATIVE mg/dL
Ketones, ur: NEGATIVE mg/dL
Leukocytes,Ua: NEGATIVE
Nitrite: NEGATIVE
Protein, ur: 30 mg/dL — AB
Specific Gravity, Urine: 1.031 — ABNORMAL HIGH (ref 1.005–1.030)
pH: 5 (ref 5.0–8.0)

## 2022-04-07 LAB — LACTIC ACID, PLASMA
Lactic Acid, Venous: 0.7 mmol/L (ref 0.5–1.9)
Lactic Acid, Venous: 1 mmol/L (ref 0.5–1.9)
Lactic Acid, Venous: 1.1 mmol/L (ref 0.5–1.9)

## 2022-04-07 LAB — GASTROINTESTINAL PANEL BY PCR, STOOL (REPLACES STOOL CULTURE)

## 2022-04-07 LAB — C DIFFICILE QUICK SCREEN W PCR REFLEX
C Diff antigen: NEGATIVE
C Diff interpretation: NOT DETECTED
C Diff toxin: NEGATIVE

## 2022-04-07 LAB — RESP PANEL BY RT-PCR (FLU A&B, COVID) ARPGX2
Influenza A by PCR: NEGATIVE
Influenza B by PCR: NEGATIVE
SARS Coronavirus 2 by RT PCR: NEGATIVE

## 2022-04-07 LAB — BASIC METABOLIC PANEL
Anion gap: 6 (ref 5–15)
BUN: 24 mg/dL — ABNORMAL HIGH (ref 8–23)
CO2: 22 mmol/L (ref 22–32)
Calcium: 8.4 mg/dL — ABNORMAL LOW (ref 8.9–10.3)
Chloride: 112 mmol/L — ABNORMAL HIGH (ref 98–111)
Creatinine, Ser: 0.99 mg/dL (ref 0.61–1.24)
GFR, Estimated: >60 mL/min (ref >60)
Glucose, Bld: 105 mg/dL — ABNORMAL HIGH (ref 70–99)
Potassium: 4.1 mmol/L (ref 3.5–5.1)
Sodium: 140 mmol/L (ref 135–145)

## 2022-04-07 LAB — HEMOGLOBIN A1C
Hgb A1c MFr Bld: 5.6 % (ref 4.8–5.6)
Mean Plasma Glucose: 114.02 mg/dL

## 2022-04-07 LAB — TROPONIN I (HIGH SENSITIVITY): Troponin I (High Sensitivity): 11 ng/L (ref <18)

## 2022-04-07 LAB — FOLATE: Folate: 23 ng/mL (ref >5.9)

## 2022-04-07 LAB — PROCALCITONIN: Procalcitonin: <0.1 ng/mL

## 2022-04-07 MED ORDER — SODIUM CHLORIDE 0.9 % IV BOLUS
500.0000 mL | Freq: Once | INTRAVENOUS | Status: AC
  Administered 2022-04-07: 500 mL via INTRAVENOUS

## 2022-04-07 MED ORDER — AZITHROMYCIN 500 MG PO TABS
500.0000 mg | ORAL_TABLET | Freq: Every day | ORAL | Status: AC
  Administered 2022-04-07 – 2022-04-09 (×3): 500 mg via ORAL
  Filled 2022-04-07 (×3): qty 1

## 2022-04-07 MED ORDER — LOPERAMIDE HCL 2 MG PO CAPS: 2.0000 mg | ORAL_CAPSULE | Freq: Two times a day (BID) | ORAL | Status: DC | PRN

## 2022-04-07 MED ORDER — GABAPENTIN 100 MG PO CAPS
100.0000 mg | ORAL_CAPSULE | Freq: Three times a day (TID) | ORAL | Status: DC
  Administered 2022-04-07 – 2022-04-09 (×7): 100 mg via ORAL
  Filled 2022-04-07 (×7): qty 1

## 2022-04-07 MED ORDER — APIXABAN 5 MG PO TABS
5.0000 mg | ORAL_TABLET | Freq: Two times a day (BID) | ORAL | Status: DC
  Administered 2022-04-07 – 2022-04-08 (×2): 5 mg via ORAL
  Filled 2022-04-07 (×2): qty 1

## 2022-04-07 MED ORDER — SODIUM CHLORIDE 0.9 % IV SOLN: INTRAVENOUS | Status: DC

## 2022-04-07 MED ORDER — HYDRALAZINE HCL 20 MG/ML IJ SOLN: 5.0000 mg | INTRAMUSCULAR | Status: DC | PRN

## 2022-04-07 MED ORDER — SODIUM CHLORIDE 0.9 % IV BOLUS
1500.0000 mL | Freq: Once | INTRAVENOUS | Status: AC
  Administered 2022-04-07: 1500 mL via INTRAVENOUS

## 2022-04-07 MED ORDER — ONDANSETRON HCL 4 MG/2ML IJ SOLN
4.0000 mg | Freq: Four times a day (QID) | INTRAMUSCULAR | Status: DC | PRN
Start: 2022-04-07 — End: 2022-04-09

## 2022-04-07 MED ORDER — ASPIRIN 81 MG PO TBEC
81.0000 mg | DELAYED_RELEASE_TABLET | Freq: Every day | ORAL | Status: DC
  Administered 2022-04-07: 81 mg via ORAL
  Filled 2022-04-07: qty 1

## 2022-04-07 MED ORDER — STROKE: EARLY STAGES OF RECOVERY BOOK: Freq: Once | Status: AC

## 2022-04-07 MED ORDER — ACETAMINOPHEN 325 MG PO TABS
650.0000 mg | ORAL_TABLET | Freq: Four times a day (QID) | ORAL | Status: DC | PRN
  Administered 2022-04-07 (×2): 650 mg via ORAL
  Filled 2022-04-07 (×2): qty 2

## 2022-04-07 MED ORDER — ACETAMINOPHEN 650 MG RE SUPP: 650.0000 mg | Freq: Four times a day (QID) | RECTAL | Status: DC | PRN

## 2022-04-07 MED ORDER — TRAZODONE HCL 50 MG PO TABS
25.0000 mg | ORAL_TABLET | Freq: Every evening | ORAL | Status: DC | PRN
  Administered 2022-04-07: 25 mg via ORAL
  Filled 2022-04-07: qty 1

## 2022-04-07 MED ORDER — ONDANSETRON HCL 4 MG PO TABS: 4.0000 mg | ORAL_TABLET | Freq: Four times a day (QID) | ORAL | Status: DC | PRN

## 2022-04-07 MED ORDER — DOXYCYCLINE HYCLATE 100 MG PO TABS
100.0000 mg | ORAL_TABLET | Freq: Every day | ORAL | Status: DC
  Administered 2022-04-07: 100 mg via ORAL
  Filled 2022-04-07: qty 1

## 2022-04-07 MED ORDER — MAGNESIUM HYDROXIDE 400 MG/5ML PO SUSP: 30.0000 mL | Freq: Every day | ORAL | Status: DC | PRN

## 2022-04-07 MED ORDER — POTASSIUM CHLORIDE IN NACL 20-0.9 MEQ/L-% IV SOLN
INTRAVENOUS | Status: DC
  Filled 2022-04-07 (×2): qty 1000

## 2022-04-07 NOTE — H&P (Addendum)
History and Physical    Russell [name] CMK:[id number] DOB: [birth year] DOA: 04/06/2022  Referring MD/NP/PA:   PCP: Norm Parcel, MD   Patient coming from:  The patient is coming from home.   Chief Complaint: confusion, left leg weakness, left leg swelling, and diarrhea   HPI: Russell [name] is a 71 y.o. 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  Patient is a very poor historian.  He speaks [place].  I have tried to collect medical history via iPad interpreter's help, but history is very limited from patient.  I called her daughter who provided more detailed information.  Per her daughter, patient has been confused in the past several days.  Yesterday, patient was scheduled for scalp skin cancer Mohs surgery in dermatologist office, but patient went to the different doctors office wrongly. Pt had dizziness and lightheadedness in office. Pt had a near syncopal episode and started to fall from a chair but was caught by clinic staff members. Did not have injury. His daughter took him to the dermatologist office and finally had skin cancer Mohs procedure done in dermatologist office yesterday.  At that time patient was noted to be confused, with left leg weakness.  His left leg gave away.  Patient also has mild left lower leg swelling and pain.  Daughter states that the patient has discoloration in his left leg, but on my examination, patient does not have left leg discoloration.  His left leg is warm and is not cool or clammy.  Patient denies chest pain, cough, shortness breath.  No fever or chills.  Patient has diarrhea in the past several days, several watery diarrhea each  day.  Denies nausea vomiting or abdominal pain.  Denies symptoms of UTI.  Of note, PE is listed in his medical problem list, but patient denies this history.  His daughter is also not aware of this diagnosis.  Patient is not taking blood thinner. Pt has hx of spin fusion which was complicated with MRSA infection and has been on chronic doxycycline. Per his daughter pt was having low-grade temperature 99.6. His body temperature is 98.4 in ED.    Data Reviewed and ED Course: pt was found to have WBC 9.9, troponin level 11, lactic acid 1.0, negative UA urinalysis, negative COVID PCR, GFR> 60, temperature normal, blood pressure 107/78, heart rate 113, RR 20, oxygen saturation 97% on room air.  CT head is negative.  Chest x-ray showed mild cardiomegaly and mild interstitial opacity.  Patient is admitted to telemetry bed as inpatient.   EKG: I have personally reviewed.  Sinus rhythm, QTc 417, low voltage, LAD, poor R wave progression   Review of Systems:   General: no fevers, chills, no body weight gain, has fatigue HEENT: no blurry vision, hearing changes or sore throat Respiratory: no dyspnea, coughing, wheezing CV: no chest pain, no palpitations GI: no nausea, vomiting, abdominal pain, diarrhea, constipation GU: no dysuria, burning on urination, increased urinary frequency, hematuria  Ext: Has left leg swelling Neuro: no vision change or hearing loss.  Has presyncope, lightheadedness and dizziness.  Has left leg weakness. Skin: no rash, s/p of scalp Mohs surgery MSK: No muscle spasm, no deformity, no limitation of range of movement in spin Heme: No easy bruising.  Travel  history: No recent long distant travel.   Allergy:  Allergies  Allergen Reactions   Penicillins Rash    Beta-lactam allergy, DRESS Beta-lactam allergy, DRESS    Shrimp [Shellfish Allergy] Hives   Cisatracurium Other (See Comments)    Other reaction(s): Unknown Other reaction(s): Unknown    Heparin Other (See  Comments)    Other reaction(s): Unknown Other reaction(s): Unknown    Rocuronium Other (See Comments)    Other reaction(s): Unknown Other reaction(s): Unknown    Vancomycin Other (See Comments)    Other reaction(s): Unknown Other reaction(s): Unknown     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   Hyperlipidemia    Hypertension     Past Surgical History:  Procedure Laterality Date   BACK SURGERY     COLONOSCOPY WITH PROPOFOL N/A 11/05/2019   Procedure: COLONOSCOPY WITH PROPOFOL;  Surgeon: [name], MD;  Location: [facility] ENDOSCOPY;  Service: Gastroenterology;  Laterality: N/A;   PARTIAL COLECTOMY N/A 02/11/2020   Procedure: PARTIAL COLECTOMY -- sigmoid;  Surgeon: Olean Ree, MD;  Location: [facility] ORS;  Service: General;  Laterality: N/A;   SPINE SURGERY     TAKE DOWN OF INTESTINAL FISTULA N/A 02/11/2020   Procedure: TAKE DOWN OF INTESTINAL FISTULA -- colovesical fistula;  Surgeon: Olean Ree, MD;  Location: [facility] ORS;  Service: General;  Laterality: N/A;   TRACHEAL SURGERY      Social History:  reports that he has never smoked. He has never used smokeless tobacco. He reports that he does not drink alcohol and does not use drugs.  Family History:  Family History  Problem Relation Age of Onset   Prostate cancer Neg Hx    Bladder Cancer Neg Hx    Kidney cancer Neg Hx      Prior to Admission medications   Not on File    Physical Exam: Vitals:   04/07/22 0237 04/07/22 0739 04/07/22 1131 04/07/22 1710  BP: 100/71 107/78 116/85 119/75  Pulse: 94 (!) 102 (!) 109 (!) 114  Resp: '15 16 18 20  '$ Temp: 98 F (36.7 C) 98.4 F (36.9 C) 98 F (36.7 C) (!) 103.1 F (39.5 C)  TempSrc:    Oral  SpO2: 97% 97% 98% 98%  Weight:      Height:       General: Not in acute distress HEENT:       Eyes: PERRL, EOMI, no scleral icterus.       ENT: No discharge from the ears and nose       Neck: No JVD, no bruit, no mass  felt. Heme: No neck lymph node enlargement. Cardiac: S1/S2, RRR, No murmurs, No gallops or rubs. Respiratory: No rales, wheezing, rhonchi or rubs. GI: Soft, nondistended, nontender, no organomegaly, BS present. GU: No hematuria Ext: Has 1+ left leg swelling. 1+DP/PT pulse bilaterally. Musculoskeletal: No joint deformities, No joint redness or warmth, no limitation of ROM in spin. Skin: No rashes.  Neuro: Mildly confused, alert, still oriented X3, cranial nerves II-XII grossly intact, moves all extremities normally. Muscle strength 5/5 in all extremities, sensation to light touch intact.   Psych: Patient is not psychotic, no suicidal or hemocidal ideation.  Labs on Admission: I have personally reviewed following labs and imaging studies  CBC: Recent Labs  Lab 04/06/22 2005 04/07/22 0418  WBC 10.8* 9.9  HGB 13.7 13.5  HCT 41.9 41.6  MCV 91.9 92.2  PLT 167 147*  Basic Metabolic Panel: Recent Labs  Lab 04/06/22 2005 04/07/22 0418  NA 137 140  K 3.6 4.1  CL 109 112*  CO2 19* 22  GLUCOSE 172* 105*  BUN 23 24*  CREATININE 1.21 0.99  CALCIUM 8.8* 8.4*   GFR: Estimated Creatinine Clearance: 77.7 mL/min (by C-G formula based on SCr of 0.99 mg/dL). Liver Function Tests: Recent Labs  Lab 04/06/22 2005  AST 21  ALT 15  ALKPHOS 67  BILITOT 0.9  PROT 6.7  ALBUMIN 3.8   No results for input(s): LIPASE, AMYLASE in the last 168 hours. No results for input(s): AMMONIA in the last 168 hours. Coagulation Profile: No results for input(s): INR, PROTIME in the last 168 hours. Cardiac Enzymes: No results for input(s): CKTOTAL, CKMB, CKMBINDEX, TROPONINI in the last 168 hours. BNP (last 3 results) No results for input(s): PROBNP in the last 8760 hours. HbA1C: Recent Labs    04/07/22 0418  HGBA1C 5.6   CBG: No results for input(s): GLUCAP in the last 168 hours. Lipid Profile: Recent Labs    04/07/22 0418  CHOL 121  HDL 32*  LDLCALC 78  TRIG 53  CHOLHDL 3.8    Thyroid Function Tests: No results for input(s): TSH, T4TOTAL, FREET4, T3FREE, THYROIDAB in the last 72 hours. Anemia Panel: Recent Labs    04/07/22 1330  FOLATE 23.0   Urine analysis:    Component Value Date/Time   COLORURINE YELLOW (A) 04/06/2022 2341   APPEARANCEUR HAZY (A) 04/06/2022 2341   APPEARANCEUR Hazy (A) 03/10/2020 1151   LABSPEC 1.031 (H) 04/06/2022 2341   PHURINE 5.0 04/06/2022 2341   GLUCOSEU NEGATIVE 04/06/2022 2341   HGBUR MODERATE (A) 04/06/2022 2341   BILIRUBINUR NEGATIVE 04/06/2022 2341   BILIRUBINUR Negative 03/10/2020 [place] 04/06/2022 2341   PROTEINUR 30 (A) 04/06/2022 2341   NITRITE NEGATIVE 04/06/2022 2341   LEUKOCYTESUR NEGATIVE 04/06/2022 2341   Sepsis Labs: '@LABRCNTIP'$ (procalcitonin:4,lacticidven:4) ) Recent Results (from the past 240 hour(s))  Culture, blood (routine x 2)     Status: None (Preliminary result)   Collection Time: 04/06/22 11:41 PM   Specimen: BLOOD  Result Value Ref Range Status   Specimen Description BLOOD LEFT HAND  Final   Special Requests NONE  Final   Culture   Final    NO GROWTH < 24 HOURS Performed at [facility], [address], [place], [place] [address]    Report Status PENDING  Incomplete  Culture, blood (routine x 2)     Status: None (Preliminary result)   Collection Time: 04/06/22 11:41 PM   Specimen: BLOOD  Result Value Ref Range Status   Specimen Description BLOOD RIGHT HAND  Final   Special Requests   Final    BOTTLES DRAWN AEROBIC AND ANAEROBIC Blood Culture results may not be optimal due to an excessive volume of blood received in culture bottles   Culture   Final    NO GROWTH < 24 HOURS Performed at [facility], [address], [place], [place] [address]    Report Status PENDING  Incomplete  Resp Panel by RT-PCR (Flu A&B, Covid) Nasopharyngeal Swab     Status: None   Collection Time: 04/06/22 11:41 PM   Specimen: Nasopharyngeal Swab; Nasopharyngeal(NP) swabs  in vial transport medium  Result Value Ref Range Status   SARS Coronavirus 2 by RT PCR NEGATIVE NEGATIVE Final    Comment: (NOTE) SARS-CoV-2 target nucleic acids are NOT DETECTED.  The SARS-CoV-2 RNA is generally detectable in upper  respiratory specimens during the acute phase of infection. The lowest concentration of SARS-CoV-2 viral copies this assay can detect is 138 copies/mL. A negative result does not preclude SARS-Cov-2 infection and should not be used as the sole basis for treatment or other patient management decisions. A negative result may occur with  improper specimen collection/handling, submission of specimen other than nasopharyngeal swab, presence of viral mutation(s) within the areas targeted by this assay, and inadequate number of viral copies(<138 copies/mL). A negative result must be combined with clinical observations, patient history, and epidemiological information. The expected result is Negative.  Fact Sheet for Patients:  EntrepreneurPulse.com.au  Fact Sheet for Healthcare Providers:  IncredibleEmployment.be  This test is no t yet approved or cleared by the [place] FDA and  has been authorized for detection and/or diagnosis of SARS-CoV-2 by FDA under an Emergency Use Authorization (EUA). This EUA will remain  in effect (meaning this test can be used) for the duration of the COVID-19 declaration under Section 564(b)(1) of the Act, 21 U.S.C.section 360bbb-3(b)(1), unless the authorization is terminated  or revoked sooner.       Influenza A by PCR NEGATIVE NEGATIVE Final   Influenza B by PCR NEGATIVE NEGATIVE Final    Comment: (NOTE) The Xpert Xpress SARS-CoV-2/FLU/RSV plus assay is intended as an aid in the diagnosis of influenza from Nasopharyngeal swab specimens and should not be used as a sole basis for treatment. Nasal washings and aspirates are unacceptable for Xpert Xpress  SARS-CoV-2/FLU/RSV testing.  Fact Sheet for Patients: EntrepreneurPulse.com.au  Fact Sheet for Healthcare Providers: IncredibleEmployment.be  This test is not yet approved or cleared by the [place] FDA and has been authorized for detection and/or diagnosis of SARS-CoV-2 by FDA under an Emergency Use Authorization (EUA). This EUA will remain in effect (meaning this test can be used) for the duration of the COVID-19 declaration under Section 564(b)(1) of the Act, 21 U.S.C. section 360bbb-3(b)(1), unless the authorization is terminated or revoked.  Performed at [facility], [place], [place] [address]   C Difficile Quick Screen w PCR reflex     Status: None   Collection Time: 04/07/22  1:20 PM   Specimen: STOOL  Result Value Ref Range Status   C Diff antigen NEGATIVE NEGATIVE Final   C Diff toxin NEGATIVE NEGATIVE Final   C Diff interpretation No C. difficile detected.  Final    Comment: Performed at [facility], [address] [place], [place] [address]  Gastrointestinal Panel by PCR , Stool     Status: Abnormal   Collection Time: 04/07/22  1:20 PM   Specimen: STOOL  Result Value Ref Range Status   Campylobacter species DETECTED (A) NOT DETECTED Final    Comment: RESULT CALLED TO, READ BACK BY AND VERIFIED WITH: PAISLEY DEMAIO '@1723'$  ON 04/07/22 SKL    Plesimonas shigelloides NOT DETECTED NOT DETECTED Final   Salmonella species NOT DETECTED NOT DETECTED Final   Yersinia enterocolitica NOT DETECTED NOT DETECTED Final   Vibrio species NOT DETECTED NOT DETECTED Final   Vibrio cholerae NOT DETECTED NOT DETECTED Final   Enteroaggregative E coli (EAEC) NOT DETECTED NOT DETECTED Final   Enteropathogenic E coli (EPEC) DETECTED (A) NOT DETECTED Final    Comment: RESULT CALLED TO, READ BACK BY AND VERIFIED WITH: PAISLEY DEMAIO '@1723'$  ON 04/07/22 SKL    Enterotoxigenic E coli (ETEC) NOT DETECTED NOT DETECTED  Final   Shiga like toxin producing E coli (STEC) NOT DETECTED NOT DETECTED Final  Shigella/Enteroinvasive E coli (EIEC) NOT DETECTED NOT DETECTED Final   Cryptosporidium NOT DETECTED NOT DETECTED Final   Cyclospora cayetanensis NOT DETECTED NOT DETECTED Final   Entamoeba histolytica NOT DETECTED NOT DETECTED Final   Giardia lamblia NOT DETECTED NOT DETECTED Final   Adenovirus F40/41 NOT DETECTED NOT DETECTED Final   Astrovirus NOT DETECTED NOT DETECTED Final   Norovirus GI/GII NOT DETECTED NOT DETECTED Final   Rotavirus A NOT DETECTED NOT DETECTED Final   Sapovirus (I, II, IV, and V) NOT DETECTED NOT DETECTED Final    Comment: Performed at [facility], [address] [place], [place] [address]     Radiological Exams on Admission: DG Ankle Complete Left  Result Date: 04/06/2022 CLINICAL DATA:  Left ankle pain and swelling, initial encounter EXAM: LEFT ANKLE COMPLETE - 3+ VIEW COMPARISON:  None Available. FINDINGS: Tarsal degenerative changes are noted. No acute fracture or dislocation is seen. No soft tissue abnormality is noted. IMPRESSION: Degenerative change without acute abnormality. Electronically Signed   By: Inez Catalina M.D.   On: 04/06/2022 20:41   CT Head Wo Contrast  Result Date: 04/07/2022 CLINICAL DATA:  Altered mental status EXAM: CT HEAD WITHOUT CONTRAST TECHNIQUE: Contiguous axial images were obtained from the base of the skull through the vertex without intravenous contrast. RADIATION DOSE REDUCTION: This exam was performed according to the departmental dose-optimization program which includes automated exposure control, adjustment of the mA and/or kV according to patient size and/or use of iterative reconstruction technique. COMPARISON:  None Available. FINDINGS: Brain: There is no mass, hemorrhage or extra-axial collection. The size and configuration of the ventricles and extra-axial CSF spaces are normal. There is hypoattenuation of the white matter, most  commonly indicating chronic small vessel disease. Vascular: No abnormal hyperdensity of the major intracranial arteries or dural venous sinuses. No intracranial atherosclerosis. Skull: The visualized skull base, calvarium and extracranial soft tissues are normal. Sinuses/Orbits: No fluid levels or advanced mucosal thickening of the visualized paranasal sinuses. No mastoid or middle ear effusion. The orbits are normal. IMPRESSION: Chronic small vessel disease without acute intracranial abnormality. Electronically Signed   By: Ulyses Jarred M.D.   On: 04/07/2022 00:36   MR BRAIN WO CONTRAST  Result Date: 04/07/2022 CLINICAL DATA:  TIA.  Dizziness and fall. EXAM: MRI HEAD WITHOUT CONTRAST TECHNIQUE: Multiplanar, multiecho pulse sequences of the brain and surrounding structures were obtained without intravenous contrast. COMPARISON:  CT head 04/07/2022 FINDINGS: Brain: Motion degraded study. Negative for acute infarct. Mild chronic microvascular ischemic changes in the white matter. Several scattered areas of microhemorrhage in both cerebral hemispheres. No subdural hematoma. Negative for mass or hydrocephalus. Vascular: Normal arterial flow voids Skull and upper cervical spine: No focal skeletal lesion. Left parietal scalp hematoma Sinuses/Orbits: Mild mucosal edema paranasal sinuses. Negative orbit Other: None IMPRESSION: Motion degraded study Negative for acute infarct. Mild chronic microvascular ischemic changes in the white matter Scattered small areas of microhemorrhage in the brain. Correlate with hypertension history. Left parietal scalp hematoma. Electronically Signed   By: Franchot Gallo M.D.   On: 04/07/2022 12:53   US Venous Img Lower Bilateral (DVT)  Result Date: 04/07/2022 CLINICAL DATA:  71 year old male with history of leg swelling and left foot swelling for 5 days. EXAM: BILATERAL LOWER EXTREMITY VENOUS DOPPLER ULTRASOUND TECHNIQUE: Gray-scale sonography with graded compression, as well as color  Doppler and duplex ultrasound were performed to evaluate the lower extremity deep venous systems from the level of the common femoral vein and including the common  femoral, femoral, profunda femoral, popliteal and calf veins including the posterior tibial, peroneal and gastrocnemius veins when visible. The superficial great saphenous vein was also interrogated. Spectral Doppler was utilized to evaluate flow at rest and with distal augmentation maneuvers in the common femoral, femoral and popliteal veins. COMPARISON:  None Available. FINDINGS: RIGHT LOWER EXTREMITY Common Femoral Vein: No evidence of thrombus. Normal compressibility, respiratory phasicity and response to augmentation. Saphenofemoral Junction: No evidence of thrombus. Normal compressibility and flow on color Doppler imaging. Profunda Femoral Vein: No evidence of thrombus. Normal compressibility and flow on color Doppler imaging. Femoral Vein: No evidence of thrombus. Normal compressibility, respiratory phasicity and response to augmentation. Popliteal Vein: No evidence of thrombus. Normal compressibility, respiratory phasicity and response to augmentation. Calf Veins: No evidence of thrombus. Normal compressibility and flow on color Doppler imaging. Other Findings:  None. LEFT LOWER EXTREMITY Common Femoral Vein: No evidence of thrombus. Normal compressibility, respiratory phasicity and response to augmentation. Saphenofemoral Junction: No evidence of thrombus. Normal compressibility and flow on color Doppler imaging. Profunda Femoral Vein: No evidence of thrombus. Normal compressibility and flow on color Doppler imaging. Femoral Vein: Expansile, heterogeneously hypoechoic, nonocclusive thrombus in the mid to distal femoral vein with diminished compressibility. The femoral vein is patent centrally. Popliteal Vein: Expansile, heterogeneously hypoechoic, nonocclusive thrombus present with diminished compressibility. Calf Veins: No evidence of thrombus.  Normal compressibility and flow on color Doppler imaging. Other Findings:  None. IMPRESSION: 1. Acute appearing, nonocclusive deep vein thrombosis thrombus extending from the left popliteal vein to the mid left femoral vein. No evidence of iliac involvement. 2. No evidence of right lower extremity deep vein thrombosis. These results will be called to the ordering clinician or representative by the Radiologist Assistant, and communication documented in the PACS or [employer]. Ruthann Cancer, MD [facility] Electronically Signed   By: Ruthann Cancer M.D.   On: 04/07/2022 14:28   DG Chest Port 1 View  Result Date: 04/07/2022 CLINICAL DATA:  Altered mental status EXAM: PORTABLE CHEST 1 VIEW COMPARISON:  None Available. FINDINGS: Shallow lung inflation with mild interstitial opacity and mild cardiomegaly. No sizable pleural effusion. Long segment thoracic spinal fusion. IMPRESSION: Shallow lung inflation and mild cardiomegaly. Mild interstitial opacity could indicate infection or pulmonary edema. Electronically Signed   By: Ulyses Jarred M.D.   On: 04/07/2022 00:33   DG Hip Unilat W or Wo Pelvis 2-3 Views Left  Result Date: 04/06/2022 CLINICAL DATA:  Left hip pain. EXAM: DG HIP (WITH OR WITHOUT PELVIS) 2-3V LEFT COMPARISON:  CT abdomen pelvis dated 05/01/2020. FINDINGS: There is no acute fracture or dislocation. The bones are osteopenic. Old healed fracture deformity of the left femoral neck with a large heterotopic ossification as seen on the prior CT. Left femoral neck fixation hardware. The hardware is intact. No evidence of loosening. The soft tissues are unremarkable. IMPRESSION: 1. No acute fracture or dislocation. 2. Chronic changes and prior ORIF. Electronically Signed   By: Anner Crete M.D.   On: 04/06/2022 20:41      Assessment/Plan Principal Problem:   Acute metabolic encephalopathy Active Problems:   Sepsis ([place])    Campylobacter diarrhea   Dyslipidemia   HTN (hypertension)   MRSA infection   Peripheral neuropathy   DVT (deep venous thrombosis) (HCC)   Principal Problem:   Acute metabolic encephalopathy Active Problems:   Sepsis ([place])   Campylobacter diarrhea   Dyslipidemia   HTN (hypertension)   MRSA infection   Peripheral  neuropathy   DVT (deep venous thrombosis) (HCC)   Assessment and Plan: * Acute metabolic encephalopathy Etiology is not clear.  CT head is negative for acute intracranial abnormalities. Initially suspected TIA by accepting MD. 2d echo was ordered. I also did MRI of brain which has motion degradation, but is negative for acute stroke.  Other differential diagnosis includes side effects of gabapentin, delirium, sepsis -Admitted to telemetry bed as inpatient -Frequent neurochecks -Decrease gabapentin dose from 300 to 100 mg 3 times daily -check Vitamin B12 level and folate   Sepsis ([place]) Sepsis due to Campylobacter and EPEC diarrhea.  C. difficile testing negative.  GI pathogen panel is positive for Campylobacter and EPEC.  Patient has sepsis with heart rate 113, RR 20.  Initially patient did not have fever, but developed fever of 103.1 in the late afternoon.  Lactic acid normal 1.0.  -Start azithromycin 500 mg daily -Blood culture -Check procalcitonin level -IV fluid: Total of 2.5 L normal saline bolus, followed by 75 cc/h  Campylobacter diarrhea -See above  DVT (deep venous thrombosis) (HCC) Pt is allergic to heparin -start Eliquis  Peripheral neuropathy -on neurontin  MRSA infection Hx of MRSA -continue doxycylcline  HTN (hypertension) Not taking meds currently. Bp 107/78 -IV hydralazine prn   Dyslipidemia Not taking meds -f/u with PCP             DVT ppx: SCD  Code Status: Full code  Family Communication:   Yes, patient's daughter   at bed side.    Disposition Plan:  Anticipate discharge back to previous environment  Consults  called:  none  Admission status and Level of care: Telemetry Medical:   for obs    Severity of Illness:  The appropriate patient status for this patient is INPATIENT. Inpatient status is judged to be reasonable and necessary in order to provide the required intensity of service to ensure the patient's safety. The patient's presenting symptoms, physical exam findings, and initial radiographic and laboratory data in the context of their chronic comorbidities is felt to place them at high risk for further clinical deterioration. Furthermore, it is not anticipated that the patient will be medically stable for discharge from the hospital within 2 midnights of admission.   * I certify that at the point of admission it is my clinical judgment that the patient will require inpatient hospital care spanning beyond 2 midnights from the point of admission due to high intensity of service, high risk for further deterioration and high frequency of surveillance required.*       Date of Service 04/07/2022    Ivor Costa [facility]   If 7PM-7AM, please contact night-coverage [email or web address] 04/07/2022, 6:26 PM

## 2022-04-07 NOTE — Assessment & Plan Note (Addendum)
Hx of MRSA -continue his chronic doxycylcline.  C. difficile has been ruled out.

## 2022-04-07 NOTE — Evaluation (Signed)
Occupational Therapy Evaluation Patient Details Name: Russell [name] [name]: [id number] DOB: 1950/12/18 Today's Date: 04/07/2022   History of Present Illness Russell [name] is a 71 y.o. male brought to the ED from home by his family with a chief complaint of altered mentation, dizziness and fall.  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   Clinical Impression   Upon entering the room, pt supine in bed with no c/o pain and agreeable to OT intervention. Pt is pleasant and cooperative throughout. He does request use of interpreter and utilized [employer] interpreter Olegario Shearer [phone] during session. Pt reports living at home with wife and use of [facility] for mobility at baseline and independence in self care. He lives in a town house and bedroom is on second floor but he jokingly reports sleeping on couch downstairs. He keeps L knee extended in front of him while seated because of "tightness". Pt ambulates without physical assistance in room but reports need for BM and does not sit over top of toilet but continues to have BM in standing. Pt reporting this is how he does it at home as well because toilet is lower. OT recommends use of BSC for safety to raise seat. Pt returning back to bed for MD to assess him. All needs within reach. Pt appears to be close to baseline with no further skilled acute OT needed at this time. OT to SIGN OFF.      Recommendations for follow up therapy are one component of a multi-disciplinary discharge planning process, led by the attending physician.  Recommendations may be updated based on patient status, additional functional criteria and insurance authorization.   Follow Up Recommendations  No OT follow up    Assistance Recommended at Discharge Set up  Supervision/Assistance  Patient can return home with the following Help with stairs or ramp for entrance;Direct supervision/assist for financial management;Direct supervision/assist for medications management       Equipment Recommendations  BSC/3in1       Precautions / Restrictions Precautions Precautions: Fall Precaution Comments: mod fall Restrictions Weight Bearing Restrictions: No      Mobility Bed Mobility Overal bed mobility: Modified Independent                  Transfers Overall transfer level: Needs assistance   Transfers: Sit to/from Stand Sit to Stand: Supervision                  Balance Overall balance assessment: Modified Independent, Mild deficits observed, not formally tested                                         ADL either performed or assessed with clinical judgement   ADL Overall ADL's : Modified independent                                       General ADL Comments: no physical assistance for hygiene and clothing management while standing in bathroom. Pt bends forwards and cleans commode as well without LOB this session.     Vision Patient Visual Report: No change from baseline  Pertinent Vitals/Pain Pain Assessment Pain Assessment: No/denies pain     Hand Dominance Right   Extremity/Trunk Assessment Upper Extremity Assessment Upper Extremity Assessment: Defer to OT evaluation   Lower Extremity Assessment Lower Extremity Assessment: Overall WFL for tasks assessed LLE Deficits / Details: keeps his left LE extended. He can bend the knee, but chooses not to. States it is tight.   Cervical / Trunk Assessment Cervical / Trunk Assessment: Normal   Communication Communication Communication: Prefers language other than English;Interpreter utilized   Cognition Arousal/Alertness: Awake/alert Behavior During Therapy: WFL for tasks assessed/performed Overall Cognitive Status:  Within Functional Limits for tasks assessed                                                  Home Living Family/patient expects to be discharged to:: Private residence Living Arrangements: Spouse/significant other Available Help at Discharge: Family;Available 24 hours/day Type of Home: House Home Access: Stairs to enter [employer] of Steps: 1   Home Layout: Two level;Bed/bath upstairs Alternate Level Stairs-Number of Steps: flight, states he sleeps on couch on first floor Alternate Level Stairs-Rails: Left Bathroom Shower/Tub: Tub/shower unit         Home Equipment: Cane - single point          Prior Functioning/Environment Prior Level of Function : Independent/Modified Independent             Mobility Comments: uses cane at baseline ADLs Comments: independent                 OT Goals(Current goals can be found in the care plan section) Acute Rehab OT Goals Patient Stated Goal: to go home OT Goal Formulation: With patient Time For Goal Achievement: 04/07/22 Potential to Achieve Goals: Good  OT Frequency:         AM-PAC OT "6 Clicks" Daily Activity     Outcome Measure Help from another person eating meals?: None Help from another person taking care of personal grooming?: None Help from another person toileting, which includes using toliet, bedpan, or urinal?: None Help from another person bathing (including washing, rinsing, drying)?: None Help from another person to put on and taking off regular upper body clothing?: None Help from another person to put on and taking off regular lower body clothing?: None 6 Click Score: 24   End of Session Nurse Communication: Mobility status  Activity Tolerance: Patient tolerated treatment well Patient left: in bed;with call bell/phone within reach;with bed alarm set                   Time: 3009-2330 OT Time Calculation (min): 23 min Charges:  OT General Charges $OT Visit: 1  Visit OT Evaluation $OT Eval Low Complexity: 1 Low  Darleen Crocker, MS, OTR/L , CBIS ascom [phone]  04/07/22, 1:31 PM

## 2022-04-07 NOTE — Assessment & Plan Note (Addendum)
Also with enterotoxigenic E. coli.  Completed 3 days of Zithromax for both.  Patient states diarrhea has since stopped.  Pressures are stable.  We will plan to discharge and provide some education on monitoring for Guillain-Barr syndrome in the next 2 months.  Not common, but high association with Campylobacter.

## 2022-04-07 NOTE — Assessment & Plan Note (Addendum)
Sepsis due to Campylobacter and EPEC diarrhea.  C. difficile testing negative.  Patient met criteria for sepsis on admission given tachycardia, tachypnea and fever.  GI pathogen panel is positive for Campylobacter and EPEC.  Continue fluids and antibiotics.  Sepsis has stabilized.

## 2022-04-07 NOTE — Assessment & Plan Note (Signed)
Not taking meds currently. Bp 107/78 -IV hydralazine prn

## 2022-04-07 NOTE — Progress Notes (Signed)
   04/07/22 1710  Assess: MEWS Score  Temp (!) 103.1 F (39.5 C)  BP 119/75  Pulse Rate (!) 114  Resp 20  SpO2 98 %  O2 Device Room Air  Assess: MEWS Score  MEWS Temp 2  MEWS Systolic 0  MEWS Pulse 2  MEWS RR 0  MEWS LOC 0  MEWS Score 4  MEWS Score Color Red  Assess: if the MEWS score is Yellow or Red  Were vital signs taken at a resting state? Yes  Focused Assessment No change from prior assessment  Does the patient meet 2 or more of the SIRS criteria? Yes  Does the patient have a confirmed or suspected source of infection? Yes  Provider and Rapid Response Notified? Yes  MEWS guidelines implemented *See Row Information* Yes  Treat  MEWS Interventions Administered prn meds/treatments  Pain Scale 0-10  Pain Score 0  Take Vital Signs  Increase Vital Sign Frequency  Red: Q 1hr X 4 then Q 4hr X 4, if remains red, continue Q 4hrs  Escalate  MEWS: Escalate Red: discuss with charge nurse/RN and provider, consider discussing with RRT  Notify: Charge Nurse/RN  Name of Charge Nurse/RN Notified Doloris Hall  Date Charge Nurse/RN Notified 04/07/22  Time Charge Nurse/RN Notified 1710  Notify: Provider  Provider Name/Title Niu  Date Provider Notified 04/07/22  Time Provider Notified 1715  Method of Notification Call  Notification Reason Other (Comment) (Red MEWS)  Provider response No new orders  Date of Provider Response 04/07/22  Time of Provider Response 9798  Notify: Rapid Response  Name of Rapid Response RN Notified Melanie  Date Rapid Response Notified 04/07/22  Time Rapid Response Notified 9211  Document  Patient Outcome Other (Comment) (Will continue to monitor)  Progress note created (see row info) Yes  Assess: SIRS CRITERIA  SIRS Temperature  1  SIRS Pulse 1  SIRS Respirations  0  SIRS WBC 0  SIRS Score Sum  2

## 2022-04-07 NOTE — Assessment & Plan Note (Signed)
Not taking meds -f/u with PCP

## 2022-04-07 NOTE — ED Notes (Signed)
Pt assisted to the restroom by this RN - Pt had one episode of diarrhea.

## 2022-04-07 NOTE — Progress Notes (Signed)
SLP Cancellation Note  Patient Details Name: Russell [name] [name]: [id number] DOB: 04/15/1951   Cancelled treatment:       Reason Eval/Treat Not Completed: Patient at procedure or test/unavailable   Attempted cognitive-linguistic evaluation for second time this date. Pt currently OTF for ultrasound. Will continue efforts as appropriate.  Cherrie Gauze, M.S., [facility] [phone] ([place])  Quintella Baton 04/07/2022, 1:35 PM

## 2022-04-07 NOTE — Assessment & Plan Note (Signed)
-  on neurontin

## 2022-04-07 NOTE — Progress Notes (Signed)
SLP Cancellation Note  Patient Details Name: Russell [name] [name]: [id number] DOB: [birth year]   Cancelled treatment:       Reason Eval/Treat Not Completed: Patient at procedure or test/unavailable   SLP consult received and appreciated. Chart review completed. Per chart review, pt OTF for MRI. Most recent NIH Stroke Scale = 0. Will plan to assess pt, as appropriate.  Cherrie Gauze, M.S., [facility] [phone] [place])   Quintella Baton 04/07/2022, 11:53 AM

## 2022-04-07 NOTE — Evaluation (Signed)
Physical Therapy Evaluation Patient Details Name: Russell [name] [name]: [id number] DOB: Apr 19, 1951 Today's Date: 04/07/2022  History of Present Illness  Russell [name] is a 71 y.o. male brought to the ED from home by his family with a chief complaint of altered mentation, dizziness and fall.  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   Clinical Impression  Patient received in bed, OT present in room with interpreter via [place]. Russell [name] #[id number]. Patient does speak some english but having difficulty answering home set up and prior functional level questions without interpreter. He is mod independent with bed mobility. Transfers with min guard. Ambulated into bathroom with min guard and no AD, then performed his own clean up in bathroom and ambulated back to bed pushing IV pole. MD in room and waiting to assess patient at this time so ambulation distance limited. He is able to return to supine from sitting with mod independence. Patient appears to be close to baseline as far as mobility goes, but will try to assess more ambulation next visit. He will benefit from continued skilled PT to ensure safety with mobility.           Recommendations for follow up therapy are one component of a multi-disciplinary discharge planning process, led by the attending physician.  Recommendations may be updated based on patient status, additional functional criteria and insurance authorization.  Follow Up Recommendations No PT follow up    Assistance Recommended at Discharge Intermittent Supervision/Assistance  Patient can return home with the following  A little help with walking and/or transfers;A little help with bathing/dressing/bathroom;Help with stairs or ramp for entrance    Equipment  Recommendations None recommended by PT  Recommendations for Other Services       Functional Status Assessment Patient has had a recent decline in their functional status and demonstrates the ability to make significant improvements in function in a reasonable and predictable amount of time.     Precautions / Restrictions Precautions Precautions: Fall Precaution Comments: mod fall Restrictions Weight Bearing Restrictions: No      Mobility  Bed Mobility Overal bed mobility: Modified Independent                  Transfers Overall transfer level: Needs assistance   Transfers: Sit to/from Stand Sit to Stand: Min guard           General transfer comment: stands from elevated bed with left LE extended, requires min guard for safety    Ambulation/Gait Ambulation/Gait assistance: Supervision Gait Distance (Feet): 25 Feet Assistive device: IV Pole, None Gait Pattern/deviations: Step-through pattern Gait velocity: decr     General Gait Details: patient ambulated to bathroom and back to bed without ad or pushing to IV pole. 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[employer] (Stroke Patients Only)       Balance Overall balance assessment: Modified Independent, Mild deficits observed, not formally tested                                           Pertinent Vitals/Pain Pain Assessment Pain Assessment: No/denies pain    Home  Living Family/patient expects to be discharged to:: Private residence Living Arrangements: Spouse/significant other Available Help at Discharge: Family;Available 24 hours/day Type of Home: House Home Access: Stairs to enter   [employer] of Steps: 1 Alternate Level Stairs-Number of Steps: flight, states he sleeps on couch on first floor Home Layout: Two level;Bed/bath upstairs Home Equipment: Cane - single point      Prior Function Prior Level of  Function : Independent/Modified Independent             Mobility Comments: uses cane at baseline ADLs Comments: independent     Hand Dominance        Extremity/Trunk Assessment   Upper Extremity Assessment Upper Extremity Assessment: Defer to OT evaluation    Lower Extremity Assessment Lower Extremity Assessment: Overall WFL for tasks assessed;LLE deficits/detail LLE Deficits / Details: keeps his left LE extended. He can bend the knee, but chooses not to. States it is tight.    Cervical / Trunk Assessment Cervical / Trunk Assessment: Normal  Communication   Communication: Prefers language other than English  Cognition Arousal/Alertness: Awake/alert Behavior During Therapy: WFL for tasks assessed/performed Overall Cognitive Status: Within Functional Limits for tasks assessed                                          General Comments      Exercises     Assessment/Plan    PT Assessment Patient needs continued PT services  PT Problem List Decreased strength;Decreased mobility;Decreased balance;Decreased safety awareness;Decreased activity tolerance       PT Treatment Interventions DME instruction;Therapeutic exercise;Gait training;Balance training;Stair training;Functional mobility training;Therapeutic activities;Patient/family education    PT Goals (Current goals can be found in the Care Plan section)  Acute Rehab PT Goals Patient Stated Goal: none stated PT Goal Formulation: With patient Time For Goal Achievement: 04/20/22    Frequency Min 2X/week     Co-evaluation               AM-PAC PT "6 Clicks" Mobility  Outcome Measure Help needed turning from your back to your side while in a flat bed without using bedrails?: None Help needed moving from lying on your back to sitting on the side of a flat bed without using bedrails?: None Help needed moving to and from a bed to a chair (including a wheelchair)?: A Little Help needed  standing up from a chair using your arms (e.g., wheelchair or bedside chair)?: A Little Help needed to walk in hospital room?: A Little Help needed climbing 3-5 steps with a railing? : A Little 6 Click Score: 20    End of Session   Activity Tolerance: Patient tolerated treatment well Patient left: in bed;with call bell/phone within reach;Other (comment) (MD in room) Nurse Communication: Mobility status PT Visit Diagnosis: Unsteadiness on feet (R26.81);Muscle weakness (generalized) (M62.81);Difficulty in walking, not elsewhere classified (R26.2);History of falling (Z91.81)    Time: 9811-9147 PT Time Calculation (min) (ACUTE ONLY): 18 min   Charges:   PT Evaluation $PT Eval Moderate Complexity: 1 Mod          Jerime Arif, PT, GCS 04/07/22,11:48 AM

## 2022-04-07 NOTE — Progress Notes (Signed)
*  PRELIMINARY RESULTS* Echocardiogram 2D Echocardiogram has been performed.  Russell [name] 04/07/2022, 11:28 AM

## 2022-04-07 NOTE — Assessment & Plan Note (Addendum)
Because patient has been complaining of leg pain and weakness, ultrasound of lower extremity done and patient found to have an acute nonocclusive DVT from the left popliteal to mid left femoral.  He is allergic to heparin so he has been started on Eliquis.  No evidence of DVT in the right lower extremity.

## 2022-04-07 NOTE — Assessment & Plan Note (Addendum)
Suspect etiology secondary to pain medication versus anesthesia she received for dermatology procedure which probably led to worsening hypotension along with what was caused by diarrhea.  Resolved with hydration.

## 2022-04-07 NOTE — ED Notes (Signed)
Pt provided a urinal.  

## 2022-04-08 DIAGNOSIS — I82432 Acute embolism and thrombosis of left popliteal vein: Secondary | ICD-10-CM | POA: Diagnosis not present

## 2022-04-08 DIAGNOSIS — E663 Overweight: Secondary | ICD-10-CM | POA: Diagnosis not present

## 2022-04-08 DIAGNOSIS — A045 Campylobacter enteritis: Secondary | ICD-10-CM | POA: Diagnosis not present

## 2022-04-08 DIAGNOSIS — G9341 Metabolic encephalopathy: Secondary | ICD-10-CM | POA: Diagnosis not present

## 2022-04-08 LAB — URINE CULTURE: Culture: <10000 — AB

## 2022-04-08 LAB — VITAMIN B12: Vitamin B-12: 1424 pg/mL — ABNORMAL HIGH (ref 180–914)

## 2022-04-08 MED ORDER — APIXABAN 5 MG PO TABS
10.0000 mg | ORAL_TABLET | Freq: Two times a day (BID) | ORAL | Status: DC
  Administered 2022-04-08 – 2022-04-09 (×2): 10 mg via ORAL
  Filled 2022-04-08 (×2): qty 2

## 2022-04-08 MED ORDER — DOXYCYCLINE HYCLATE 100 MG PO TABS: 100.0000 mg | ORAL_TABLET | Freq: Every day | ORAL | Status: DC

## 2022-04-08 MED ORDER — APIXABAN 5 MG PO TABS: 5.0000 mg | ORAL_TABLET | Freq: Two times a day (BID) | ORAL | Status: DC

## 2022-04-08 MED ORDER — APIXABAN 5 MG PO TABS
5.0000 mg | ORAL_TABLET | Freq: Once | ORAL | Status: AC
  Administered 2022-04-08: 5 mg via ORAL
  Filled 2022-04-08: qty 1

## 2022-04-08 NOTE — TOC Initial Note (Signed)
Transition of Care [facility]) - Initial/Assessment Note    Patient Details  Name: Russell [name] [name]: [id number] Date of Birth: 1951-08-12  Transition of Care [facility]) CM/SW Contact:    Shelbie Hutching, RN Phone Number: 04/08/2022, 1:54 PM  Clinical Narrative:                  Transition of Care [facility]) Screening Note   Patient Details  Name: Russell [name] Date of Birth: [birth year]   Transition of Care [facility]) CM/SW Contact:    Shelbie Hutching, RN Phone Number: 04/08/2022, 1:54 PM    Transition of Care Department [facility]) has reviewed patient and no TOC needs have been identified at this time. We will continue to monitor patient advancement through interdisciplinary progression rounds. If new patient transition needs arise, please place a TOC consult.    Expected Discharge Plan: Home/Self Care Barriers to Discharge: Continued Medical Work up   Patient Goals and CMS Choice        Expected Discharge Plan and Services Expected Discharge Plan: Home/Self Care       Living arrangements for the past 2 months: Single Family Home                                      Prior Living Arrangements/Services Living arrangements for the past 2 months: Single Family Home Lives with:: Spouse                   Activities of Daily Living Home Assistive Devices/Equipment: None ADL Screening (condition at time of admission) Patient's cognitive ability adequate to safely complete daily activities?: Yes Is the patient deaf or have difficulty hearing?: No Does the patient have difficulty seeing, even when wearing glasses/contacts?: No Does the patient have difficulty concentrating, remembering, or making decisions?: No Patient able to express need for assistance with ADLs?: Yes Does the patient have difficulty dressing or bathing?: No Independently performs ADLs?: Yes (appropriate for developmental age) Does the patient have difficulty walking or climbing stairs?: No Weakness of  Legs: None Weakness of Arms/Hands: None  Permission Sought/Granted                  Emotional Assessment              Admission diagnosis:  Encephalopathy [G93.40] Near syncope [R55] Altered mental status, unspecified altered mental status type [Q03.47] Acute metabolic encephalopathy [Q25.95] Patient Active Problem List   Diagnosis Date Noted   Overweight (BMI 25.0-29.9) 04/08/2022   HTN (hypertension) 04/07/2022   Pulmonary embolism ([place]) 04/07/2022   MRSA infection 04/07/2022   Peripheral neuropathy 04/07/2022   DVT (deep venous thrombosis) ([place]) 04/07/2022   Fever 04/07/2022   Campylobacter diarrhea 04/07/2022   Knee pain 09/22/2021   Dyslipidemia 09/05/2021   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   Peripheral polyneuropathy 02/10/2021   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   Primary osteoarthritis of left knee 01/06/2021   Diverticulitis of colon with perforation 05/03/2020   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   Carpal joint sprain 09/11/2019   Cervical spondylosis without myelopathy 09/11/2019   DDD (degenerative disc disease), cervical 09/11/2019   Lumbosacral stenosis 08/30/2019   Degeneration of lumbosacral intervertebral disc 08/30/2019   Unilateral vocal cord paralysis 08/03/2018   Hypotension 02/10/2018   Acute blood loss anemia 02/10/2018   S/P spinal fusion 01/17/2018   Other specified abnormal immunological findings in serum 11/27/2017   Postoperative or  surgical complication 93/26/7124   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[place]) 11/25/2017   Fluid collection at surgical site 11/25/2017   Delirium due to another medical condition 10/05/2017   BMI 26.0-26.9,adult 06/26/2017   GERD (gastroesophageal reflux disease) 05/09/2017   Chronic cough 11/21/2016   Heterotopic ossification 03/01/2016   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 pylori) infection 02/11/2014   Dyspepsia 02/05/2014   Neck pain 10/11/2013   Pain in joint, pelvic region and thigh 10/11/2013   Low back pain 10/11/2013   PCP:  Norm Parcel, MD Pharmacy:   [facility] [address] [place] [address] [place] [address] Phone: [phone] Fax: [phone]     Social Determinants of Health (SDOH) Interventions    Readmission Risk Interventions     View : No data to display.

## 2022-04-08 NOTE — Hospital Course (Signed)
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  Prior to coming in, patient has had several days of watery diarrhea.  Patient was scheduled to go for a Mohs procedure on 5/17, but instead went to the wrong doctor's office. (His daughter took him to the correct doctor's office that he did have the procedure done.)  At that time, he was confused and complaining of left leg weakness.  With the symptoms persisting, he came into the emergency room.  Patient found to have sepsis secondary to Campylobacter and EPEC.  He was started on IV fluids and antibiotics and admitted to the hospitalist service on 5/18.

## 2022-04-08 NOTE — Progress Notes (Signed)
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  Prior to coming in, patient has had several days of watery diarrhea.  Patient was scheduled to go for a Mohs procedure on 5/17, but instead went to the wrong doctor's office. (His daughter took him to the correct doctor's office that he did have the procedure done.)  At that time, he was confused and complaining of left leg weakness.  With the symptoms persisting, he came into the emergency room.  Patient found to have sepsis secondary to Campylobacter and EPEC.  He was started on IV fluids and antibiotics and admitted to the hospitalist service on 5/18.  Assessment and Plan: Assessment and Plan: * Acute metabolic encephalopathy-resolved as of 04/08/2022 Suspect etiology secondary to pain medication versus anesthesia she received for dermatology procedure which probably led to worsening hypotension along with what was caused by diarrhea.  Appears to have resolved given fluids.   Sepsis (HCC)-resolved as of 04/08/2022 Sepsis due to Campylobacter and EPEC diarrhea.  C. difficile testing negative.  Patient met criteria for sepsis on admission given tachycardia, tachypnea and fever.  GI pathogen panel is positive for Campylobacter and EPEC.  Continue fluids and antibiotics.  Sepsis has since stabilized.  Campylobacter diarrhea Also with enterotoxigenic E. coli.  On 3 days of Zithromax for both.  Need to provide some education on monitoring for Guillain-Barr syndrome in the next 2 months.  Not common, but high association with Campylobacter.  Still having significant  amount of stool, so continue rectal tube and supportive measures.  DVT (deep venous thrombosis) (HCC) Because patient has been complaining of leg pain and weakness, ultrasound of lower extremity done and patient found to have an acute nonocclusive DVT from the left popliteal to mid left femoral.  He is allergic to heparin so he has been started on Eliquis.  No evidence of DVT in the right lower extremity.  HTN (hypertension) Not taking meds currently. Bp 107/78 -IV hydralazine prn   Peripheral neuropathy -on neurontin  MRSA infection Hx of MRSA -continue doxycylcline  Dyslipidemia Not taking meds -f/u with PCP  Overweight (BMI 25.0-29.9) Meets criteria BMI greater than 25       Body mass index is 29.77 kg/m.        Consultants: None  Procedures: Lower extremity Dopplers noting left lower extremity DVT  Antimicrobials: Zithromax 5/18 - 5/20  Code Status: Full code   Subjective: Patient with no complaints.  Feeling better.  Still with some diarrhea  Objective: Vital signs were reviewed and unremarkable. Vitals:   04/08/22 0725 04/08/22 1211  BP: 104/72 114/77  Pulse: 87 78  Resp: 18 16  Temp: 98.7 F (37.1 C) 98.1 F (36.7 C)  SpO2: 96% 94%    Intake/Output Summary (Last 24 hours) at 04/08/2022 1348 Last data filed at 04/08/2022 0727 Gross per 24 hour  Intake 1467.1 ml  Output 1625 ml  Net -157.9 ml   Filed Weights   04/07/22 0222  Weight: 94.1 kg   Body mass index is 29.77 kg/m.  Exam:  General: Alert and oriented x3, no acute distress HEENT: Normocephalic, atraumatic, mucous membranes are moist Cardiovascular: Regular  rate and rhythm, S1-S2 Respiratory: Clear to auscultation bilaterally Abdomen: Soft, nontender, nondistended, positive bowel sounds Musculoskeletal: No clubbing or cyanosis or edema Skin: No skin breaks, tears or lesions Psychiatry: Appropriate, no evidence of psychoses Neurology: No focal deficits  Data  Reviewed: Noted improvement in renal function  Disposition:  Status is: Inpatient Remains inpatient appropriate because: Improvement in diarrhea    Anticipated discharge date: 5/20 or 5/21 Family Communication: Wife at the bedside, updated daughter by phone DVT Prophylaxis:  apixaban (ELIQUIS) tablet 10 mg  apixaban (ELIQUIS) tablet 5 mg    Author: Annita Brod ,MD 04/08/2022 1:48 PM  To reach On-call, see care teams to locate the attending and reach out via [email or web address]. Between 7PM-7AM, please contact night-coverage If you still have difficulty reaching the attending provider, please page the [facility] (Director on Call) for [facility] on amion for assistance.

## 2022-04-08 NOTE — Progress Notes (Signed)
SLP Cancellation Note  Patient Details Name: Russell [name] [name]: [id number] DOB: 1951/10/18   Cancelled treatment:       Reason Eval/Treat Not Completed: SLP screened, no needs identified, will sign off  Havana Baldwin B. Rutherford Nail M.S., CCC-SLP, [place] Office [phone]  Stormy Fabian 04/08/2022, 2:33 PM

## 2022-04-08 NOTE — Progress Notes (Signed)
PT Cancellation Note  Patient Details Name: Russell [name] [name]: [id number] DOB: [birth year]   Cancelled Treatment:    Reason Eval/Treat Not Completed: Patient declined, no reason specified. Patient wanted to wait until after lunch. Will return later if time allows.    Sue Fernicola 04/08/2022, 1:20 PM

## 2022-04-08 NOTE — Assessment & Plan Note (Signed)
Meets criteria BMI greater than 25 

## 2022-04-08 NOTE — Progress Notes (Signed)
Physical Therapy Treatment Patient Details Name: Geoffry Bannister [name]: [id number] DOB: 11-22-1950 Today's Date: 04/08/2022   History of Present Illness Hooper Petteway is a 71 y.o. male brought to the ED from home by his family with a chief complaint of altered mentation, dizziness and fall.  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    PT Comments    Patient received in bed, he is agreeable to PT session. Requires cues for safety regarding lines, leads. Impulsive. Required min A to stand from bed due to decreased Left knee flexion which is chronic. Patient requires assist with ambulation to manage lines, otherwise supervision. He appears to be at or near baseline with mobility. Patient will be safe to return home when medically appropriate. Will continue to follow to maintain mobility and ensure safe return home.       Recommendations for follow up therapy are one component of a multi-disciplinary discharge planning process, led by the attending physician.  Recommendations may be updated based on patient status, additional functional criteria and insurance authorization.  Follow Up Recommendations  No PT follow up     Assistance Recommended at Discharge PRN  Patient can return home with the following A little help with walking and/or transfers   Equipment Recommendations  None recommended by PT    Recommendations for Other Services       Precautions / Restrictions Precautions Precaution Comments: mod fall Restrictions Weight Bearing Restrictions: No     Mobility  Bed Mobility Overal bed mobility: Modified Independent             General bed mobility comments: decreased awareness of lines    Transfers Overall transfer level: Needs  assistance Equipment used: 1 person hand held assist Transfers: Sit to/from Stand Sit to Stand: Min assist           General transfer comment: requires assistance from bed due to L knee with limited ROM in knee flexion- keeps it extended during transfers    Ambulation/Gait Ambulation/Gait assistance: Supervision Gait Distance (Feet): 60 Feet Assistive device: None Gait Pattern/deviations: Step-through pattern Gait velocity: WFL     General Gait Details: generally steady wtih ambulation in room. Assist needed to manage lines. Appears at baseline.   Stairs             Wheelchair Mobility    Modified Rankin (Stroke Patients Only)       Balance Overall balance assessment: Independent                                          Cognition Arousal/Alertness: Awake/alert Behavior During Therapy: WFL for tasks assessed/performed Overall Cognitive Status: Within Functional Limits for tasks assessed                                          Exercises      General Comments        Pertinent Vitals/Pain Pain Assessment Pain Assessment: No/denies pain    Home Living  Prior Function            PT Goals (current goals can now be found in the care plan section) Acute Rehab PT Goals Patient Stated Goal: go home PT Goal Formulation: With patient Time For Goal Achievement: 04/20/22 Progress towards PT goals: Progressing toward goals    Frequency    Min 2X/week      PT Plan Current plan remains appropriate    Co-evaluation              AM-PAC PT "6 Clicks" Mobility   Outcome Measure  Help needed turning from your back to your side while in a flat bed without using bedrails?: None Help needed moving from lying on your back to sitting on the side of a flat bed without using bedrails?: None Help needed moving to and from a bed to a chair (including a wheelchair)?: None Help needed  standing up from a chair using your arms (e.g., wheelchair or bedside chair)?: A Little Help needed to walk in hospital room?: None Help needed climbing 3-5 steps with a railing? : A Little 6 Click Score: 22    End of Session   Activity Tolerance: Patient tolerated treatment well Patient left: in bed;with call bell/phone within reach;with nursing/sitter in room Nurse Communication: Mobility status PT Visit Diagnosis: Other abnormalities of gait and mobility (R26.89)     Time: 4765-4650 PT Time Calculation (min) (ACUTE ONLY): 11 min  Charges:  $Gait Training: 8-22 mins                     Jyra Lagares, PT, GCS 04/08/22,2:54 PM

## 2022-04-08 NOTE — Consult Note (Signed)
ANTICOAGULATION CONSULT NOTE - Initial Consult  Pharmacy Consult for apixaban Indication: DVT  Allergies  Allergen Reactions   Penicillins Rash    Beta-lactam allergy, DRESS Beta-lactam allergy, DRESS    Shrimp [Shellfish Allergy] Hives   Cisatracurium Other (See Comments)    Other reaction(s): Unknown Other reaction(s): Unknown    Heparin Other (See Comments)    Other reaction(s): Unknown Other reaction(s): Unknown    Rocuronium Other (See Comments)    Other reaction(s): Unknown Other reaction(s): Unknown    Vancomycin Other (See Comments)    Other reaction(s): Unknown Other reaction(s): Unknown     Patient Measurements: Height: '5\' 10"'$  (177.8 cm) Weight: 94.1 kg (207 lb 7.3 oz) IBW/kg (Calculated) : 73  Vital Signs: Temp: 98.7 F (37.1 C) (05/19 0725) Temp Source: Oral (05/19 0725) BP: 104/72 (05/19 0725) Pulse Rate: 87 (05/19 0725)  Labs: Recent Labs    04/06/22 2005 04/06/22 2341 04/07/22 0418  HGB 13.7  --  13.5  HCT 41.9  --  41.6  PLT 167  --  147*  CREATININE 1.21  --  0.99  TROPONINIHS 11 11  --     Estimated Creatinine Clearance: 77.7 mL/min (by C-G formula based on SCr of 0.99 mg/dL).   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 Pharmacy consulted for eliquis in the setting of DVT.  Goal of Therapy:  Monitor platelets by anticoagulation protocol:  Yes   Plan:  Start Eliquis '10mg'$  BID x7 days followed by Eliquis '5mg'$  BID Continue to monitor CBC every 3 days with AM labs  Darrick Penna 04/08/2022,10:21 AM

## 2022-04-09 DIAGNOSIS — G9341 Metabolic encephalopathy: Secondary | ICD-10-CM | POA: Diagnosis not present

## 2022-04-09 DIAGNOSIS — E785 Hyperlipidemia, unspecified: Secondary | ICD-10-CM | POA: Diagnosis not present

## 2022-04-09 DIAGNOSIS — I82432 Acute embolism and thrombosis of left popliteal vein: Secondary | ICD-10-CM | POA: Diagnosis not present

## 2022-04-09 DIAGNOSIS — A045 Campylobacter enteritis: Secondary | ICD-10-CM | POA: Diagnosis not present

## 2022-04-09 LAB — CBC
HCT: 40 % (ref 39.0–52.0)
Hemoglobin: 13.3 g/dL (ref 13.0–17.0)
MCH: 30.6 pg (ref 26.0–34.0)
MCHC: 33.3 g/dL (ref 30.0–36.0)
MCV: 92 fL (ref 80.0–100.0)
Platelets: 147 10*3/uL — ABNORMAL LOW (ref 150–400)
RBC: 4.35 MIL/uL (ref 4.22–5.81)
RDW: 13.5 % (ref 11.5–15.5)
WBC: 6.9 10*3/uL (ref 4.0–10.5)
nRBC: 0 % (ref 0.0–0.2)

## 2022-04-09 MED ORDER — APIXABAN (ELIQUIS) VTE STARTER PACK (10MG AND 5MG)
ORAL_TABLET | ORAL | 0 refills | Status: DC
Start: 2022-04-09 — End: 2022-10-09

## 2022-04-09 NOTE — Discharge Summary (Signed)
Physician Discharge Summary   Patient: Russell [name] [name]: [id number] DOB: [birth year]  Admit date:     04/06/2022  Discharge date: 04/09/22  Discharge Physician: Annita Brod   PCP: Norm Parcel, MD   Recommendations at discharge:   New medication: Eliquis 5 mg p.o. twice daily x7 days, then increase to 10 mg p.o. twice daily.  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  Prior to coming in, patient has had several days of watery diarrhea.  Patient was scheduled to go for a Mohs procedure on 5/17, but instead went to the wrong doctor's office. (His daughter took him to the correct doctor's office that he did have the procedure done.)  At that time, he was confused and complaining of left leg weakness.  With the symptoms persisting, he came into the emergency room.  Patient found to have sepsis secondary to Campylobacter and EPEC.  He was started on IV fluids and antibiotics and admitted to the hospitalist service on 5/18.  Lower extremity Doppler of his left leg revealed an acute DVT and patient was started on Eliquis.  Assessment and Plan: * Acute metabolic encephalopathy-resolved as of 04/08/2022 Suspect etiology secondary to pain medication versus anesthesia she received for dermatology procedure which probably led to worsening hypotension along with what was caused by diarrhea.   Resolved with hydration.  Sepsis (HCC)-resolved as of 04/08/2022 Sepsis due to Campylobacter and EPEC diarrhea.  C. difficile testing negative.  Patient met criteria for sepsis on admission given tachycardia, tachypnea and fever.  GI pathogen panel is positive for Campylobacter and EPEC.  Continue fluids and antibiotics.  Sepsis has stabilized.  Campylobacter diarrhea Also with enterotoxigenic E. coli.  Completed 3 days of Zithromax for both.  Patient states diarrhea has since stopped.  Pressures are stable.  We will plan to discharge and provide some education on monitoring for Guillain-Barr syndrome in the next 2 months.  Not common, but high association with Campylobacter.    DVT (deep venous thrombosis) (HCC) Because patient has been complaining of leg pain and weakness, ultrasound of lower extremity done and patient found to have an acute nonocclusive DVT from the left popliteal to mid left femoral.  He is allergic to heparin so he has been started on Eliquis.  No evidence of DVT in the right lower extremity.  HTN (hypertension) Not taking meds currently. Bp 107/78 -IV hydralazine prn   Peripheral neuropathy -on neurontin  MRSA infection Hx of MRSA -continue his chronic doxycylcline.  C. difficile has been ruled out.  Dyslipidemia Not taking meds -f/u with PCP  Overweight (BMI 25.0-29.9) Meets criteria BMI greater than 25         Consultants: None Procedures performed: Lower extremity Dopplers noting left lower extremity DVT Disposition: Home Diet recommendation:  Discharge Diet Orders (From admission, onward)     Start     Ordered   04/09/22 0000  Diet - low sodium heart healthy  04/09/22 1225           Cardiac diet DISCHARGE MEDICATION: Allergies as of 04/09/2022       Reactions   Penicillins Rash   Beta-lactam allergy, DRESS Beta-lactam allergy, DRESS   Shrimp [shellfish Allergy] Hives   Cisatracurium Other (See Comments)   Other reaction(s):  Unknown Other reaction(s): Unknown   Heparin Other (See Comments)   Other reaction(s): Unknown Other reaction(s): Unknown   Rocuronium Other (See Comments)   Other reaction(s): Unknown Other reaction(s): Unknown   Vancomycin Other (See Comments)   Other reaction(s): Unknown Other reaction(s): Unknown        Medication List     TAKE these medications    Apixaban Starter Pack ($RemoveBefor'10mg'DmTMCIaByNtt$  and $Re'5mg'tUS$ ) Commonly known as: ELIQUIS STARTER PACK Take as directed on package: start with two-$RemoveBefore'5mg'NUJtiWGJXwYUi$  tablets twice daily for 7 days. On day 8, switch to one-$RemoveBefor'5mg'SYmuhUPlvbnP$  tablet twice daily.   doxycycline 100 MG capsule Commonly known as: VIBRAMYCIN Take 100 mg by mouth daily.   gabapentin 300 MG capsule Commonly known as: NEURONTIN Take 300 mg by mouth 3 (three) times daily.        Follow-up Information     Norm Parcel, MD. Schedule an appointment as soon as possible for a visit in 2 week(s).   Specialty: Family Medicine Contact information: [place] [address] [phone]                Discharge Exam: Danley Danker Weights   04/07/22 0222  Weight: 94.1 kg   General: Alert and oriented x3, no acute distress Cardiovascular: Regular rate and rhythm, S1-S2 Lungs: Clear to auscultation bilaterally  Condition at discharge: good  The results of significant diagnostics from this hospitalization (including imaging, microbiology, ancillary and laboratory) are listed below for reference.   Imaging Studies: DG Ankle Complete Left  Result Date: 04/06/2022 CLINICAL DATA:  Left ankle pain and swelling, initial encounter EXAM: LEFT ANKLE COMPLETE - 3+ VIEW COMPARISON:  None Available. FINDINGS: Tarsal degenerative changes are noted. No acute fracture or dislocation is seen. No soft tissue abnormality is noted. IMPRESSION: Degenerative change without acute abnormality. Electronically Signed   By: Inez Catalina M.D.   On: 04/06/2022 20:41   CT Head Wo  Contrast  Result Date: 04/07/2022 CLINICAL DATA:  Altered mental status EXAM: CT HEAD WITHOUT CONTRAST TECHNIQUE: Contiguous axial images were obtained from the base of the skull through the vertex without intravenous contrast. RADIATION DOSE REDUCTION: This exam was performed according to the departmental dose-optimization program which includes automated exposure control, adjustment of the mA and/or kV according to patient size and/or use of iterative reconstruction technique. COMPARISON:  None Available. FINDINGS: Brain: There is no mass, hemorrhage or extra-axial collection. The size and configuration of the ventricles and extra-axial CSF spaces are normal. There is hypoattenuation of the white matter, most commonly indicating chronic small vessel disease. Vascular: No abnormal hyperdensity of the major intracranial arteries or dural venous sinuses. No intracranial atherosclerosis. Skull: The visualized skull base, calvarium and extracranial soft tissues are normal. Sinuses/Orbits: No fluid levels or advanced mucosal thickening of the visualized paranasal sinuses. No mastoid or middle ear effusion. The orbits are normal. IMPRESSION: Chronic small vessel disease without acute intracranial abnormality. Electronically Signed   By: Ulyses Jarred M.D.   On: 04/07/2022 00:36   MR BRAIN WO CONTRAST  Result Date: 04/07/2022 CLINICAL DATA:  TIA.  Dizziness and fall. EXAM: MRI HEAD WITHOUT CONTRAST TECHNIQUE: Multiplanar,  multiecho pulse sequences of the brain and surrounding structures were obtained without intravenous contrast. COMPARISON:  CT head 04/07/2022 FINDINGS: Brain: Motion degraded study. Negative for acute infarct. Mild chronic microvascular ischemic changes in the white matter. Several scattered areas of microhemorrhage in both cerebral hemispheres. No subdural hematoma. Negative for mass or hydrocephalus. Vascular: Normal arterial flow voids Skull and upper cervical spine: No focal skeletal lesion.  Left parietal scalp hematoma Sinuses/Orbits: Mild mucosal edema paranasal sinuses. Negative orbit Other: None IMPRESSION: Motion degraded study Negative for acute infarct. Mild chronic microvascular ischemic changes in the white matter Scattered small areas of microhemorrhage in the brain. Correlate with hypertension history. Left parietal scalp hematoma. Electronically Signed   By: Franchot Gallo M.D.   On: 04/07/2022 12:53   US Venous Img Lower Bilateral (DVT)  Result Date: 04/07/2022 CLINICAL DATA:  71 year old male with history of leg swelling and left foot swelling for 5 days. EXAM: BILATERAL LOWER EXTREMITY VENOUS DOPPLER ULTRASOUND TECHNIQUE: Gray-scale sonography with graded compression, as well as color Doppler and duplex ultrasound were performed to evaluate the lower extremity deep venous systems from the level of the common femoral vein and including the common femoral, femoral, profunda femoral, popliteal and calf veins including the posterior tibial, peroneal and gastrocnemius veins when visible. The superficial great saphenous vein was also interrogated. Spectral Doppler was utilized to evaluate flow at rest and with distal augmentation maneuvers in the common femoral, femoral and popliteal veins. COMPARISON:  None Available. FINDINGS: RIGHT LOWER EXTREMITY Common Femoral Vein: No evidence of thrombus. Normal compressibility, respiratory phasicity and response to augmentation. Saphenofemoral Junction: No evidence of thrombus. Normal compressibility and flow on color Doppler imaging. Profunda Femoral Vein: No evidence of thrombus. Normal compressibility and flow on color Doppler imaging. Femoral Vein: No evidence of thrombus. Normal compressibility, respiratory phasicity and response to augmentation. Popliteal Vein: No evidence of thrombus. Normal compressibility, respiratory phasicity and response to augmentation. Calf Veins: No evidence of thrombus. Normal compressibility and flow on color  Doppler imaging. Other Findings:  None. LEFT LOWER EXTREMITY Common Femoral Vein: No evidence of thrombus. Normal compressibility, respiratory phasicity and response to augmentation. Saphenofemoral Junction: No evidence of thrombus. Normal compressibility and flow on color Doppler imaging. Profunda Femoral Vein: No evidence of thrombus. Normal compressibility and flow on color Doppler imaging. Femoral Vein: Expansile, heterogeneously hypoechoic, nonocclusive thrombus in the mid to distal femoral vein with diminished compressibility. The femoral vein is patent centrally. Popliteal Vein: Expansile, heterogeneously hypoechoic, nonocclusive thrombus present with diminished compressibility. Calf Veins: No evidence of thrombus. Normal compressibility and flow on color Doppler imaging. Other Findings:  None. IMPRESSION: 1. Acute appearing, nonocclusive deep vein thrombosis thrombus extending from the left popliteal vein to the mid left femoral vein. No evidence of iliac involvement. 2. No evidence of right lower extremity deep vein thrombosis. These results will be called to the ordering clinician or representative by the Radiologist Assistant, and communication documented in the PACS or [employer]. Ruthann Cancer, MD [facility] Radiology Electronically Signed   By: Ruthann Cancer M.D.   On: 04/07/2022 14:28   DG Chest Port 1 View  Result Date: 04/07/2022 CLINICAL DATA:  Altered mental status EXAM: PORTABLE CHEST 1 VIEW COMPARISON:  None Available. FINDINGS: Shallow lung inflation with mild interstitial opacity and mild cardiomegaly. No sizable pleural effusion. Long segment thoracic spinal fusion. IMPRESSION: Shallow lung inflation and mild cardiomegaly. Mild interstitial opacity could indicate infection or pulmonary edema. Electronically Signed   By: Lennette Bihari  Collins [place] M.D.   On: 04/07/2022 00:33   ECHOCARDIOGRAM LIMITED BUBBLE STUDY  Result Date: 04/07/2022     ECHOCARDIOGRAM LIMITED REPORT   Patient Name:   Russell [name] Date of Exam: 04/07/2022 Medical Rec #:  [id number]      Height:       70.0 in Accession #:    [id number]     Weight:       207.5 lb Date of Birth:  1951-01-22      BSA:          2.120 m Patient Age:    1 years       BP:           107/78 mmHg Patient Gender: M              HR:           107 bpm. Exam Location:  [facility] Procedure: Limited Echo, Limited Color Doppler, Cardiac Doppler and Saline            Contrast Bubble Study Indications:     G45.9 TIA  History:         Patient has no prior history of Echocardiogram examinations.                  Risk Factors:Hypertension and Dyslipidemia.  Sonographer:     Charmayne Sheer Referring Phys:  [id number] [place] Diagnosing Phys: Ida Rogue MD IMPRESSIONS  1. Left ventricular ejection fraction, by estimation, is 60 to 65%. The left ventricle has normal function. The left ventricle has no regional wall motion abnormalities. Left ventricular diastolic parameters are consistent with Grade I diastolic dysfunction (impaired relaxation).  2. Right ventricular systolic function is normal. The right ventricular size is normal.  3. The mitral valve is normal in structure. Mild mitral valve regurgitation. No evidence of mitral stenosis.  4. The aortic valve is normal in structure. Aortic valve regurgitation is not visualized. Aortic valve sclerosis is present, with no evidence of aortic valve stenosis.  5. The inferior vena cava is normal in size with greater than 50% respiratory variability, suggesting right atrial pressure of 3 mmHg.  6. Agitated saline contrast bubble study was negative, with no evidence of any interatrial shunt. FINDINGS  Left Ventricle: Left ventricular ejection fraction, by estimation, is 60 to 65%. The left ventricle has normal function. The left ventricle has no regional wall motion abnormalities. The left ventricular internal cavity size was normal in size. There is  no left ventricular  hypertrophy. Left ventricular diastolic parameters are consistent with Grade I diastolic dysfunction (impaired relaxation). Right Ventricle: The right ventricular size is normal. No increase in right ventricular wall thickness. Right ventricular systolic function is normal. Left Atrium: Left atrial size was normal in size. Right Atrium: Right atrial size was normal in size. Pericardium: There is no evidence of pericardial effusion. Mitral Valve: The mitral valve is normal in structure. Mild mitral valve regurgitation. No evidence of mitral valve stenosis. MV peak gradient, 8.1 mmHg. The mean mitral valve gradient is 4.0 mmHg. Tricuspid Valve: The tricuspid valve is normal in structure. Tricuspid valve regurgitation is mild . No evidence of tricuspid stenosis. Aortic Valve: The aortic valve is normal in structure. Aortic valve regurgitation is not visualized. Aortic valve sclerosis is present, with no evidence of aortic valve stenosis. Aortic valve mean gradient measures 8.0 mmHg. Aortic valve peak gradient measures 15.5 mmHg. Aortic valve area, by VTI measures 3.31 cm. Pulmonic Valve: The pulmonic valve was normal in  structure. Pulmonic valve regurgitation is not visualized. No evidence of pulmonic stenosis. Aorta: The aortic root is normal in size and structure. Venous: The inferior vena cava is normal in size with greater than 50% respiratory variability, suggesting right atrial pressure of 3 mmHg. IAS/Shunts: No atrial level shunt detected by color flow Doppler. Agitated saline contrast was given intravenously to evaluate for intracardiac shunting. Agitated saline contrast bubble study was negative, with no evidence of any interatrial shunt. LEFT VENTRICLE PLAX 2D LVIDd:         4.30 cm   Diastology LVIDs:         2.50 cm   LV e' medial:    11.30 cm/s LV PW:         1.16 cm   LV E/e' medial:  9.5 LV IVS:        0.88 cm   LV e' lateral:   11.90 cm/s LVOT diam:     2.40 cm   LV E/e' lateral: 9.0 LV SV:         88  LV SV Index:   41 LVOT Area:     4.52 cm  RIGHT VENTRICLE RV Basal diam:  2.73 cm LEFT ATRIUM           Index        RIGHT ATRIUM           Index LA diam:      4.50 cm 2.12 cm/m   RA Area:     19.80 cm LA Vol (A4C): 44.3 ml 20.90 ml/m  RA Volume:   46.70 ml  22.03 ml/m  AORTIC VALVE                     PULMONIC VALVE AV Area (Vmax):    2.87 cm      PV Vmax:       1.05 m/s AV Area (Vmean):   3.05 cm      PV Vmean:      75.700 cm/s AV Area (VTI):     3.31 cm      PV VTI:        0.168 m AV Vmax:           197.00 cm/s   PV Peak grad:  4.4 mmHg AV Vmean:          127.000 cm/s  PV Mean grad:  3.0 mmHg AV VTI:            0.265 m AV Peak Grad:      15.5 mmHg AV Mean Grad:      8.0 mmHg LVOT Vmax:         125.00 cm/s LVOT Vmean:        85.600 cm/s LVOT VTI:          0.194 m LVOT/AV VTI ratio: 0.73  AORTA Ao Root diam: 3.70 cm MITRAL VALVE MV Area (PHT): 8.25 cm     SHUNTS MV Area VTI:   4.04 cm     Systemic VTI:  0.19 m MV Peak grad:  8.1 mmHg     Systemic Diam: 2.40 cm MV Mean grad:  4.0 mmHg MV Vmax:       1.42 m/s MV Vmean:      94.1 cm/s MV Decel Time: 92 msec MV E velocity: 107.00 cm/s MV A velocity: 152.00 cm/s MV E/A ratio:  0.70 Ida Rogue MD Electronically signed by Ida Rogue MD Signature Date/Time: 04/07/2022/12:20:24 PM    Final  DG Hip Unilat W or Wo Pelvis 2-3 Views Left  Result Date: 04/06/2022 CLINICAL DATA:  Left hip pain. EXAM: DG HIP (WITH OR WITHOUT PELVIS) 2-3V LEFT COMPARISON:  CT abdomen pelvis dated 05/01/2020. FINDINGS: There is no acute fracture or dislocation. The bones are osteopenic. Old healed fracture deformity of the left femoral neck with a large heterotopic ossification as seen on the prior CT. Left femoral neck fixation hardware. The hardware is intact. No evidence of loosening. The soft tissues are unremarkable. IMPRESSION: 1. No acute fracture or dislocation. 2. Chronic changes and prior ORIF. Electronically Signed   By: Anner Crete M.D.   On: 04/06/2022 20:41     Microbiology: Results for orders placed or performed during the hospital encounter of 04/06/22  Culture, blood (routine x 2)     Status: None (Preliminary result)   Collection Time: 04/06/22 11:41 PM   Specimen: BLOOD  Result Value Ref Range Status   Specimen Description BLOOD LEFT HAND  Final   Special Requests NONE  Final   Culture   Final    NO GROWTH 2 DAYS Performed at [facility], [address] [place], [place] [address]    Report Status PENDING  Incomplete  Culture, blood (routine x 2)     Status: None (Preliminary result)   Collection Time: 04/06/22 11:41 PM   Specimen: BLOOD  Result Value Ref Range Status   Specimen Description BLOOD RIGHT HAND  Final   Special Requests   Final    BOTTLES DRAWN AEROBIC AND ANAEROBIC Blood Culture results may not be optimal due to an excessive volume of blood received in culture bottles   Culture   Final    NO GROWTH 2 DAYS Performed at [facility], [address] [place], [place] [address]    Report Status PENDING  Incomplete  Urine Culture     Status: Abnormal   Collection Time: 04/06/22 11:41 PM   Specimen: Urine, Random  Result Value Ref Range Status   Specimen Description   Final    URINE, RANDOM Performed at [facility], [address] [place], [place] [address]    Special Requests   Final    NONE Performed at [facility], [address] [place], [place] [address]    Culture (A)  Final    <10,000 COLONIES/mL INSIGNIFICANT GROWTH Performed at [facility], [place] [address], [place], [place] [address]    Report Status 04/08/2022 FINAL  Final  Resp Panel by RT-PCR (Flu A&B, Covid) Nasopharyngeal Swab     Status: None   Collection Time: 04/06/22 11:41 PM   Specimen: Nasopharyngeal Swab; Nasopharyngeal(NP) swabs in vial transport medium  Result Value Ref Range Status   SARS Coronavirus 2 by RT PCR NEGATIVE NEGATIVE Final    Comment: (NOTE) SARS-CoV-2 target nucleic acids  are NOT DETECTED.  The SARS-CoV-2 RNA is generally detectable in upper respiratory specimens during the acute phase of infection. The lowest concentration of SARS-CoV-2 viral copies this assay can detect is 138 copies/mL. A negative result does not preclude SARS-Cov-2 infection and should not be used as the sole basis for treatment or other patient management decisions. A negative result may occur with  improper specimen collection/handling, submission of specimen other than nasopharyngeal swab, presence of viral mutation(s) within the areas targeted by this assay, and inadequate number of viral copies(<138 copies/mL). A negative result must be combined with clinical observations, patient history, and epidemiological information. The expected result is Negative.  Fact Sheet for Patients:  EntrepreneurPulse.com.au  Fact Sheet for Healthcare Providers:  IncredibleEmployment.be  This test is no t yet approved or cleared by the [place] FDA and  has been authorized for detection and/or diagnosis of SARS-CoV-2 by FDA under an Emergency Use Authorization (EUA). This EUA will remain  in effect (meaning this test can be used) for the duration of the COVID-19 declaration under Section 564(b)(1) of the Act, 21 U.S.C.section 360bbb-3(b)(1), unless the authorization is terminated  or revoked sooner.       Influenza A by PCR NEGATIVE NEGATIVE Final   Influenza B by PCR NEGATIVE NEGATIVE Final    Comment: (NOTE) The Xpert Xpress SARS-CoV-2/FLU/RSV plus assay is intended as an aid in the diagnosis of influenza from Nasopharyngeal swab specimens and should not be used as a sole basis for treatment. Nasal washings and aspirates are unacceptable for Xpert Xpress SARS-CoV-2/FLU/RSV testing.  Fact Sheet for Patients: EntrepreneurPulse.com.au  Fact Sheet for Healthcare Providers: IncredibleEmployment.be  This test is  not yet approved or cleared by the [place] FDA and has been authorized for detection and/or diagnosis of SARS-CoV-2 by FDA under an Emergency Use Authorization (EUA). This EUA will remain in effect (meaning this test can be used) for the duration of the COVID-19 declaration under Section 564(b)(1) of the Act, 21 U.S.C. section 360bbb-3(b)(1), unless the authorization is terminated or revoked.  Performed at [facility], [place], [place] [address]   C Difficile Quick Screen w PCR reflex     Status: None   Collection Time: 04/07/22  1:20 PM   Specimen: STOOL  Result Value Ref Range Status   C Diff antigen NEGATIVE NEGATIVE Final   C Diff toxin NEGATIVE NEGATIVE Final   C Diff interpretation No C. difficile detected.  Final    Comment: Performed at [facility], [address] [place], [place] [address]  Gastrointestinal Panel by PCR , Stool     Status: Abnormal   Collection Time: 04/07/22  1:20 PM   Specimen: STOOL  Result Value Ref Range Status   Campylobacter species DETECTED (A) NOT DETECTED Final    Comment: RESULT CALLED TO, READ BACK BY AND VERIFIED WITH: PAISLEY DEMAIO $RemoveBefore'@1723'[id number]$  ON 04/07/22 SKL    Plesimonas shigelloides NOT DETECTED NOT DETECTED Final   Salmonella species NOT DETECTED NOT DETECTED Final   Yersinia enterocolitica NOT DETECTED NOT DETECTED Final   Vibrio species NOT DETECTED NOT DETECTED Final   Vibrio cholerae NOT DETECTED NOT DETECTED Final   Enteroaggregative E coli (EAEC) NOT DETECTED NOT DETECTED Final   Enteropathogenic E coli (EPEC) DETECTED (A) NOT DETECTED Final    Comment: RESULT CALLED TO, READ BACK BY AND VERIFIED WITH: PAISLEY DEMAIO $RemoveBefore'@1723'[id number]$  ON 04/07/22 SKL    Enterotoxigenic E coli (ETEC) NOT DETECTED NOT DETECTED Final   Shiga like toxin producing E coli (STEC) NOT DETECTED NOT DETECTED Final   Shigella/Enteroinvasive E coli (EIEC) NOT DETECTED NOT DETECTED Final   Cryptosporidium NOT DETECTED NOT DETECTED  Final   Cyclospora cayetanensis NOT DETECTED NOT DETECTED Final   Entamoeba histolytica NOT DETECTED NOT DETECTED Final   Giardia lamblia NOT DETECTED NOT DETECTED Final   Adenovirus F40/41 NOT DETECTED NOT DETECTED Final   Astrovirus NOT DETECTED NOT DETECTED Final   Norovirus GI/GII NOT DETECTED NOT DETECTED Final   Rotavirus A NOT DETECTED NOT DETECTED Final   Sapovirus (I, II, IV, and V) NOT DETECTED NOT DETECTED Final    Comment: Performed at [employer]  [facility], [place] [place], [place] [address]    Labs: CBC: Recent Labs  Lab 04/06/22 2005 04/07/22 0418 04/09/22 0433  WBC 10.8* 9.9 6.9  HGB 13.7 13.5 13.3  HCT 41.9 41.6 40.0  MCV 91.9 92.2 92.0  PLT 167 147* 979*   Basic Metabolic Panel: Recent Labs  Lab 04/06/22 2005 04/07/22 0418  NA 137 140  K 3.6 4.1  CL 109 112*  CO2 19* 22  GLUCOSE 172* 105*  BUN 23 24*  CREATININE 1.21 0.99  CALCIUM 8.8* 8.4*   Liver Function Tests: Recent Labs  Lab 04/06/22 2005  AST 21  ALT 15  ALKPHOS 67  BILITOT 0.9  PROT 6.7  ALBUMIN 3.8   CBG: No results for input(s): GLUCAP in the last 168 hours.  Discharge time spent: less than 30 minutes.  Signed: Annita Brod, MD [facility] 04/09/2022

## 2022-04-12 LAB — CULTURE, BLOOD (ROUTINE X 2)
Culture: NO GROWTH
Culture: NO GROWTH

## 2022-06-11 ENCOUNTER — Other Ambulatory Visit: Payer: Self-pay

## 2022-06-11 ENCOUNTER — Emergency Department: Payer: Medicare Other

## 2022-06-11 ENCOUNTER — Emergency Department
Admission: EM | Admit: 2022-06-11 | Discharge: 2022-06-11 | Disposition: A | Discharge status: Home / Self Care | Payer: Medicare Other | Attending: Emergency Medicine | Admitting: Emergency Medicine

## 2022-06-11 DIAGNOSIS — M546 Pain in thoracic spine: Secondary | ICD-10-CM | POA: Diagnosis not present

## 2022-06-11 DIAGNOSIS — R519 Headache, unspecified: Secondary | ICD-10-CM | POA: Diagnosis not present

## 2022-06-11 DIAGNOSIS — R0781 Pleurodynia: Secondary | ICD-10-CM | POA: Diagnosis not present

## 2022-06-11 DIAGNOSIS — M79602 Pain in left arm: Secondary | ICD-10-CM | POA: Diagnosis not present

## 2022-06-11 DIAGNOSIS — Y9301 Activity, walking, marching and hiking: Secondary | ICD-10-CM | POA: Diagnosis not present

## 2022-06-11 DIAGNOSIS — I10 Essential (primary) hypertension: Secondary | ICD-10-CM | POA: Insufficient documentation

## 2022-06-11 DIAGNOSIS — W19XXXA Unspecified fall, initial encounter: Secondary | ICD-10-CM

## 2022-06-11 DIAGNOSIS — W108XXA Fall (on) (from) other stairs and steps, initial encounter: Secondary | ICD-10-CM | POA: Diagnosis not present

## 2022-06-11 DIAGNOSIS — R0789 Other chest pain: Secondary | ICD-10-CM

## 2022-06-11 MED ORDER — OXYCODONE HCL 5 MG PO TABS
5.0000 mg | ORAL_TABLET | Freq: Once | ORAL | Status: AC
  Administered 2022-06-11: 5 mg via ORAL
  Filled 2022-06-11: qty 1

## 2022-06-11 MED ORDER — ACETAMINOPHEN 500 MG PO TABS: 1000.0000 mg | ORAL_TABLET | Freq: Three times a day (TID) | ORAL | 0 refills | Status: AC

## 2022-06-11 MED ORDER — OXYCODONE HCL 5 MG PO TABS: 5.0000 mg | ORAL_TABLET | Freq: Four times a day (QID) | ORAL | 0 refills | Status: DC | PRN

## 2022-06-11 MED ORDER — ONDANSETRON 4 MG PO TBDP: 4.0000 mg | ORAL_TABLET | Freq: Three times a day (TID) | ORAL | 0 refills | Status: DC | PRN

## 2022-06-11 MED ORDER — ACETAMINOPHEN 500 MG PO TABS
1000.0000 mg | ORAL_TABLET | Freq: Once | ORAL | Status: AC
  Administered 2022-06-11: 1000 mg via ORAL
  Filled 2022-06-11: qty 2

## 2022-06-11 NOTE — ED Triage Notes (Signed)
Video interpreter in use. Pt to triage via wheelchair with c/o Fall from standing. Pt reports he tripped walking up steps. Pt states he tripped, hitting left arm,shoulder and left chest. Pain worsens when moving arm.  Denies hitting head, Denies LOC.Denies use of blood thinner

## 2022-06-11 NOTE — ED Provider Notes (Signed)
[facility] Provider Note    Event Date/Time   First MD Initiated Contact with Patient 06/11/22 [phone]     (approximate)   History   Fall   HPI  Russell [name] is a 71 y.o. male with past medical history hypertension, crest syndrome, on chronic doxycycline, here with left-sided chest pain.  The patient states that he was walking upstairs earlier today when he tripped, falling onto his left side.  Reports pain in his left arm, left upper thoracic spine, and significant pain with any kind of movement or palpation.  Denies any alleviating factors.  Pain is worse with palpation as well.  No upper or lower extremity weakness or numbness.  He was well prior to the fall.  The fall was due to tripping.     Physical Exam   Triage Vital Signs: ED Triage Vitals  Enc Vitals Group     BP 06/11/22 0654 133/85     Pulse Rate 06/11/22 0654 88     Resp 06/11/22 0654 18     Temp 06/11/22 0654 98.2 F (36.8 C)     Temp Source 06/11/22 0654 Oral     SpO2 06/11/22 0654 97 %     Weight 06/11/22 0648 207 lb (93.9 kg)     Height 06/11/22 0648 '5\' 10"'$  (1.778 m)     Head Circumference --      Peak Flow --      Pain Score 06/11/22 1254 5     Pain Loc --      Pain Edu? --      Excl. in [place]? --     Most recent vital signs: Vitals:   06/11/22 1254 06/11/22 1351  BP: 125/65 133/71  Pulse: 62 66  Resp: 18 16  Temp:    SpO2: 98% 98%     General: Awake, no distress.  CV:  Good peripheral perfusion.  Regular rate and rhythm.  No murmurs. Resp:  Normal effort.  Lungs clear to auscultation. Abd:  No distention.  No tenderness. Other:  Mild tenderness over the lower paracervical left spine as well as upper thoracic spine.  Tenderness over the left anterior chest wall without bruising or deformity.  Strength out of 5 bilateral upper and lower extremities.   ED Results / Procedures / Treatments   Labs (all labs ordered are listed, but only abnormal results are  displayed) Labs Reviewed - No data to display   EKG    RADIOLOGY CT chest: Chronic lung changes, no acute traumatic abnormality CT head/C-spine: No acute abnormality, chronic changes noted Plain film left shoulder: Negative DG forearm left: Negative   I also independently reviewed and agree with radiologist interpretations.   PROCEDURES:  Critical Care performed: No     MEDICATIONS ORDERED IN ED: Medications  acetaminophen (TYLENOL) tablet 1,000 mg (1,000 mg Oral Given 06/11/22 1104)  oxyCODONE (Oxy IR/ROXICODONE) immediate release tablet 5 mg (5 mg Oral Given 06/11/22 1106)     IMPRESSION / MDM / ASSESSMENT AND PLAN / ED COURSE  I reviewed the triage vital signs and the nursing notes.                              Ddx:  Differential includes the following, with pertinent life- or limb-threatening emergencies considered:  Mechanical fall with possible rib fracture, shoulder injury, pneumothorax, hemothorax, pulmonary contusion, cervical upper thoracic injury  Patient's presentation is most consistent with acute  complicated illness / injury requiring diagnostic workup.  MDM:  71 year old male here with left upper chest and paraspinal pain in setting of mechanical fall.  No recent illness.  Patient is neurovascularly intact.  Initial plain films unremarkable but given his age and degree of tenderness, CT scans reviewed.  These were reviewed by me and the radiology interpretation also reviewed.  Agree with findings.  No acute traumatic abnormality.  He has some incidental findings but nothing that would explain his discomfort.  He feels markedly improved after being a dose of oxycodone here.  We will give him pain meds as needed at home in addition to scheduled Tylenol, discharged with outpatient follow-up and good return precautions.   MEDICATIONS GIVEN IN ED: Medications  acetaminophen (TYLENOL) tablet 1,000 mg (1,000 mg Oral Given 06/11/22 1104)  oxyCODONE (Oxy  IR/ROXICODONE) immediate release tablet 5 mg (5 mg Oral Given 06/11/22 1106)     Consults:     EMR reviewed       FINAL CLINICAL IMPRESSION(S) / ED DIAGNOSES   Final diagnoses:  Fall, initial encounter  Chest wall pain  Rib pain     Rx / DC Orders   ED Discharge Orders          Ordered    acetaminophen (TYLENOL) 500 MG tablet  3 times daily        06/11/22 1324    oxyCODONE (ROXICODONE) 5 MG immediate release tablet  Every 6 hours PRN        06/11/22 1324    ondansetron (ZOFRAN-ODT) 4 MG disintegrating tablet  Every 8 hours PRN        06/11/22 1324             Note:  This document was prepared using Dragon voice recognition software and may include unintentional dictation errors.   Duffy Bruce, MD 06/11/22 1600

## 2022-06-11 NOTE — Discharge Instructions (Addendum)
Take Tylenol 1000 mg 3 times a day for the next week.  Take oxycodone as needed for severe pain.  I recommend taking a stool softener when you are taking the oxycodone to help prevent constipation.  Follow-up as needed with your primary doctor.  Try to use incentive spirometer to remind yourself to take deep inspiration over the next several days until pain resolves.

## 2022-06-17 ENCOUNTER — Other Ambulatory Visit: Payer: Self-pay | Admitting: Sports Medicine

## 2022-06-17 DIAGNOSIS — S4992XA Unspecified injury of left shoulder and upper arm, initial encounter: Secondary | ICD-10-CM

## 2022-06-26 ENCOUNTER — Ambulatory Visit
Admission: RE | Admit: 2022-06-26 | Discharge: 2022-06-26 | Disposition: A | Discharge status: Home / Self Care | Payer: Medicare Other | Source: Ambulatory Visit | Attending: Sports Medicine | Admitting: Sports Medicine

## 2022-06-26 DIAGNOSIS — S4992XA Unspecified injury of left shoulder and upper arm, initial encounter: Secondary | ICD-10-CM

## 2022-09-22 ENCOUNTER — Other Ambulatory Visit: Payer: Self-pay

## 2022-09-22 ENCOUNTER — Emergency Department
Admission: EM | Admit: 2022-09-22 | Discharge: 2022-09-22 | Disposition: A | Discharge status: Home / Self Care | Payer: Medicare Other | Attending: Emergency Medicine | Admitting: Emergency Medicine

## 2022-09-22 ENCOUNTER — Emergency Department: Payer: Medicare Other

## 2022-09-22 DIAGNOSIS — M5442 Lumbago with sciatica, left side: Secondary | ICD-10-CM | POA: Diagnosis not present

## 2022-09-22 DIAGNOSIS — M5440 Lumbago with sciatica, unspecified side: Secondary | ICD-10-CM

## 2022-09-22 DIAGNOSIS — M545 Low back pain, unspecified: Secondary | ICD-10-CM | POA: Diagnosis present

## 2022-09-22 LAB — CBC WITH DIFFERENTIAL/PLATELET
Abs Immature Granulocytes: 0.03 10*3/uL (ref 0.00–0.07)
Basophils Absolute: 0 10*3/uL (ref 0.0–0.1)
Basophils Relative: 0 %
Eosinophils Absolute: 0.1 10*3/uL (ref 0.0–0.5)
Eosinophils Relative: 2 %
HCT: 37.9 % — ABNORMAL LOW (ref 39.0–52.0)
Hemoglobin: 12.5 g/dL — ABNORMAL LOW (ref 13.0–17.0)
Immature Granulocytes: 0 %
Lymphocytes Relative: 14 %
Lymphs Abs: 1 10*3/uL (ref 0.7–4.0)
MCH: 30 pg (ref 26.0–34.0)
MCHC: 33 g/dL (ref 30.0–36.0)
MCV: 90.9 fL (ref 80.0–100.0)
Monocytes Absolute: 0.7 10*3/uL (ref 0.1–1.0)
Monocytes Relative: 10 %
Neutro Abs: 5.3 10*3/uL (ref 1.7–7.7)
Neutrophils Relative %: 74 %
Platelets: 139 10*3/uL — ABNORMAL LOW (ref 150–400)
RBC: 4.17 MIL/uL — ABNORMAL LOW (ref 4.22–5.81)
RDW: 13.8 % (ref 11.5–15.5)
WBC: 7.2 10*3/uL (ref 4.0–10.5)
nRBC: 0 % (ref 0.0–0.2)

## 2022-09-22 LAB — COMPREHENSIVE METABOLIC PANEL
ALT: 17 U/L (ref 0–44)
AST: 14 U/L — ABNORMAL LOW (ref 15–41)
Albumin: 3.7 g/dL (ref 3.5–5.0)
Alkaline Phosphatase: 69 U/L (ref 38–126)
Anion gap: 7 (ref 5–15)
BUN: 17 mg/dL (ref 8–23)
CO2: 23 mmol/L (ref 22–32)
Calcium: 9 mg/dL (ref 8.9–10.3)
Chloride: 112 mmol/L — ABNORMAL HIGH (ref 98–111)
Creatinine, Ser: 0.92 mg/dL (ref 0.61–1.24)
GFR, Estimated: >60 mL/min (ref >60)
Glucose, Bld: 100 mg/dL — ABNORMAL HIGH (ref 70–99)
Potassium: 4.1 mmol/L (ref 3.5–5.1)
Sodium: 142 mmol/L (ref 135–145)
Total Bilirubin: 0.9 mg/dL (ref 0.3–1.2)
Total Protein: 6.6 g/dL (ref 6.5–8.1)

## 2022-09-22 LAB — URINALYSIS, ROUTINE W REFLEX MICROSCOPIC
Bacteria, UA: NONE SEEN
Bilirubin Urine: NEGATIVE
Glucose, UA: NEGATIVE mg/dL
Ketones, ur: NEGATIVE mg/dL
Leukocytes,Ua: NEGATIVE
Nitrite: NEGATIVE
Protein, ur: NEGATIVE mg/dL
Specific Gravity, Urine: 1.006 (ref 1.005–1.030)
Squamous Epithelial / HPF: NONE SEEN (ref 0–5)
pH: 5 (ref 5.0–8.0)

## 2022-09-22 MED ORDER — HYDROCODONE-ACETAMINOPHEN 5-325 MG PO TABS: 1.0000 | ORAL_TABLET | Freq: Four times a day (QID) | ORAL | 0 refills | Status: DC | PRN

## 2022-09-22 MED ORDER — DEXAMETHASONE SODIUM PHOSPHATE 10 MG/ML IJ SOLN
10.0000 mg | Freq: Once | INTRAMUSCULAR | Status: AC
  Administered 2022-09-22: 10 mg via INTRAMUSCULAR
  Filled 2022-09-22: qty 1

## 2022-09-22 MED ORDER — PREDNISONE 10 MG PO TABS: ORAL_TABLET | ORAL | 0 refills | Status: DC

## 2022-09-22 NOTE — Discharge Instructions (Addendum)
Follow-up with your primary care provider if any continued problems or need for pain medication. Do not drive and operate machinery while taking the pain medication hydrocodone as it could cause drowsiness.  The prednisone is a taper dose over the next 6 days and should help.  You may also use ice or heat to your back as needed for discomfort.

## 2022-09-22 NOTE — ED Notes (Signed)
pt's daughter called, wanted staff to know that she was very concerned about pt. because right before he got in the car to come to the ED he got very pale, and almost passed out. She wanted [place] to check his blood. I told her I'd let the provider know. Suanne Marker, [place] notified.

## 2022-09-22 NOTE — ED Triage Notes (Signed)
First Nurse Note:  C/O low back and left leg pain.  States slipped two days ago and has had pain every since.  AAOx3.  Skin warm and dry. NAD

## 2022-09-22 NOTE — ED Notes (Signed)
34 yom with a c/c of lower back and right sided back of thigh pain for 2 days. The pt is warm, pink, and dry. The pt is alert and oriented x 4. Pt wheeled to the room.

## 2022-09-22 NOTE — ED Provider Notes (Signed)
[facility] Provider Note    Event Date/Time   First MD Initiated Contact with Patient 09/22/22 [phone]     (approximate)   History   Leg Pain   HPI Per Spanish interpreter Russell [name] is a 71 y.o. male   presents to the ED with complaint of low back pain with radiation into his left lower extremity for 2 days.  Patient denies any injury to his back and states he has had this to happen to his other leg in the past.  Patient denies any injury, falling or "slipping" prior to the pain starting as the triage note suggested.  Patient took ibuprofen once without any improvement.  Patient's daughter called the ED stating that he looked pale while getting in the car and "almost passed out".  Patient states that he got nauseous but did not pass out and felt that it was from the pain from his back.  He continues to ambulate with the use of a cane.  He denies any incontinence of bowel or bladder.      Physical Exam   Triage Vital Signs: ED Triage Vitals  Enc Vitals Group     BP 09/22/22 0947 114/79     Pulse Rate 09/22/22 0947 84     Resp 09/22/22 0947 18     Temp 09/22/22 0947 98 F (36.7 C)     Temp src --      SpO2 09/22/22 0947 97 %     Weight 09/22/22 0952 207 lb 0.2 oz (93.9 kg)     Height 09/22/22 0952 '5\' 10"'$  (1.778 m)     Head Circumference --      Peak Flow --      Pain Score 09/22/22 0945 7     Pain Loc --      Pain Edu? --      Excl. in [place]? --     Most recent vital signs: Vitals:   09/22/22 0947 09/22/22 1244  BP: 114/79 120/76  Pulse: 84 80  Resp: 18 18  Temp: 98 F (36.7 C) 98 F (36.7 C)  SpO2: 97% 97%     General: Awake, no distress.  Patient is able to talk in complete sentences without any shortness of breath or difficulty. CV:  Good peripheral perfusion.  Heart regular rate and rhythm. Resp:  Normal effort.  Lungs are clear bilaterally. Abd:  No distention.  Other:  Examination of the back there is no gross deformity  however patient is tender on the left lower and SI joint area.  Patient is ambulatory with the use of a cane which is his normal.  No shortening of the lower extremity or rotation was noted.  Range of motion is without restriction.  Good muscle strength bilaterally at 5/5.   ED Results / Procedures / Treatments   Labs (all labs ordered are listed, but only abnormal results are displayed) Labs Reviewed  CBC WITH DIFFERENTIAL/PLATELET - Abnormal; Notable for the following components:      Result Value   RBC 4.17 (*)    Hemoglobin 12.5 (*)    HCT 37.9 (*)    Platelets 139 (*)    All other components within normal limits  URINALYSIS, ROUTINE W REFLEX MICROSCOPIC - Abnormal; Notable for the following components:   Color, Urine YELLOW (*)    APPearance CLEAR (*)    Hgb urine dipstick SMALL (*)    All other components within normal limits  COMPREHENSIVE METABOLIC PANEL -  Abnormal; Notable for the following components:   Chloride 112 (*)    Glucose, Bld 100 (*)    AST 14 (*)    All other components within normal limits     EKG  Deferred   RADIOLOGY  Lumbar spine x-ray images reviewed and official radiology report shows mild lower lumbar facet arthropathy.    PROCEDURES:  Critical Care performed:   Procedures   MEDICATIONS ORDERED IN ED: Medications  dexamethasone (DECADRON) injection 10 mg (10 mg Intramuscular Given 09/22/22 1432)     IMPRESSION / MDM / ASSESSMENT AND PLAN / ED COURSE  I reviewed the triage vital signs and the nursing notes.   Differential diagnosis includes, but is not limited to, lumbar pain, left lower extremities radiculopathy/sciatica, compression fracture, urinary tract infection, urolithiasis.  70 year old male presents to the ED with complaint of low back pain with radiation to his left lower extremity.  Patient denies any injury and states he has had it in his right leg in the past.  He denies any previous imaging which was obtained and showed  some mild degenerative changes.  Urinalysis was clear and reassuring.  CMP was essentially within normal limits and CBC.  With the help of the Spanish interpreter here at the hospital we explained sciatica and answered questions for patient.  He is aware that the narcotic being prescribed cannot be taken while he is driving or operating machinery but to help him rest more at night.  A prescription for tapering dose of prednisone was also sent to the pharmacy and patient received an injection of Decadron 10 mg IM.  He will follow-up with his PCP if any continued problems or concerns.  Patient was ambulatory at the time of his discharge with the use of his cane.      Patient's presentation is most consistent with acute complicated illness / injury requiring diagnostic workup.  FINAL CLINICAL IMPRESSION(S) / ED DIAGNOSES   Final diagnoses:  Acute left-sided low back pain with sciatica, sciatica laterality unspecified     Rx / DC Orders   ED Discharge Orders          Ordered    predniSONE (DELTASONE) 10 MG tablet        09/22/22 1428    HYDROcodone-acetaminophen (NORCO/VICODIN) 5-325 MG tablet  Every 6 hours PRN        09/22/22 1429             Note:  This document was prepared using Dragon voice recognition software and may include unintentional dictation errors.   Johnn Hai, PA-C 09/22/22 1524    Carrie Mew, MD 09/23/22 [phone]

## 2022-09-22 NOTE — ED Triage Notes (Signed)
Pt come with c/o left lower back pain that radiates down left leg. Pt states it is 7/10 pain.

## 2022-10-05 ENCOUNTER — Other Ambulatory Visit: Payer: Self-pay | Admitting: Sports Medicine

## 2022-10-05 DIAGNOSIS — M7989 Other specified soft tissue disorders: Secondary | ICD-10-CM

## 2022-10-06 ENCOUNTER — Inpatient Hospital Stay
Admission: EM | Admit: 2022-10-06 | Discharge: 2022-10-09 | DRG: 272 | Disposition: A | Discharge status: Home / Self Care | Payer: Medicare Other | Attending: Internal Medicine | Admitting: Internal Medicine

## 2022-10-06 ENCOUNTER — Encounter: Payer: Self-pay | Admitting: Internal Medicine

## 2022-10-06 ENCOUNTER — Inpatient Hospital Stay: Payer: Medicare Other

## 2022-10-06 ENCOUNTER — Other Ambulatory Visit: Payer: Self-pay

## 2022-10-06 ENCOUNTER — Ambulatory Visit
Admission: RE | Admit: 2022-10-06 | Discharge: 2022-10-06 | Disposition: A | Discharge status: Home / Self Care | Payer: Medicare Other | Source: Ambulatory Visit | Attending: Sports Medicine | Admitting: Sports Medicine

## 2022-10-06 DIAGNOSIS — Z86718 Personal history of other venous thrombosis and embolism: Secondary | ICD-10-CM | POA: Diagnosis not present

## 2022-10-06 DIAGNOSIS — I82441 Acute embolism and thrombosis of right tibial vein: Secondary | ICD-10-CM | POA: Diagnosis present

## 2022-10-06 DIAGNOSIS — I82409 Acute embolism and thrombosis of unspecified deep veins of unspecified lower extremity: Secondary | ICD-10-CM | POA: Diagnosis present

## 2022-10-06 DIAGNOSIS — M1990 Unspecified osteoarthritis, unspecified site: Secondary | ICD-10-CM | POA: Diagnosis present

## 2022-10-06 DIAGNOSIS — E782 Mixed hyperlipidemia: Secondary | ICD-10-CM | POA: Diagnosis present

## 2022-10-06 DIAGNOSIS — M79604 Pain in right leg: Secondary | ICD-10-CM

## 2022-10-06 DIAGNOSIS — Z880 Allergy status to penicillin: Secondary | ICD-10-CM

## 2022-10-06 DIAGNOSIS — I82220 Acute embolism and thrombosis of inferior vena cava: Secondary | ICD-10-CM | POA: Diagnosis present

## 2022-10-06 DIAGNOSIS — E669 Obesity, unspecified: Secondary | ICD-10-CM | POA: Diagnosis present

## 2022-10-06 DIAGNOSIS — Z79899 Other long term (current) drug therapy: Secondary | ICD-10-CM

## 2022-10-06 DIAGNOSIS — T45516A Underdosing of anticoagulants, initial encounter: Secondary | ICD-10-CM | POA: Diagnosis present

## 2022-10-06 DIAGNOSIS — Y92009 Unspecified place in unspecified non-institutional (private) residence as the place of occurrence of the external cause: Secondary | ICD-10-CM

## 2022-10-06 DIAGNOSIS — Z888 Allergy status to other drugs, medicaments and biological substances status: Secondary | ICD-10-CM

## 2022-10-06 DIAGNOSIS — E785 Hyperlipidemia, unspecified: Secondary | ICD-10-CM | POA: Diagnosis present

## 2022-10-06 DIAGNOSIS — T50905A Adverse effect of unspecified drugs, medicaments and biological substances, initial encounter: Secondary | ICD-10-CM | POA: Diagnosis present

## 2022-10-06 DIAGNOSIS — Z91013 Allergy to seafood: Secondary | ICD-10-CM

## 2022-10-06 DIAGNOSIS — I82411 Acute embolism and thrombosis of right femoral vein: Principal | ICD-10-CM

## 2022-10-06 DIAGNOSIS — Z881 Allergy status to other antibiotic agents status: Secondary | ICD-10-CM | POA: Diagnosis not present

## 2022-10-06 DIAGNOSIS — I10 Essential (primary) hypertension: Secondary | ICD-10-CM | POA: Diagnosis present

## 2022-10-06 DIAGNOSIS — Z9049 Acquired absence of other specified parts of digestive tract: Secondary | ICD-10-CM | POA: Diagnosis not present

## 2022-10-06 DIAGNOSIS — Z91120 Patient's intentional underdosing of medication regimen due to financial hardship: Secondary | ICD-10-CM | POA: Diagnosis not present

## 2022-10-06 DIAGNOSIS — J3801 Paralysis of vocal cords and larynx, unilateral: Secondary | ICD-10-CM | POA: Diagnosis present

## 2022-10-06 DIAGNOSIS — I82401 Acute embolism and thrombosis of unspecified deep veins of right lower extremity: Principal | ICD-10-CM | POA: Diagnosis present

## 2022-10-06 DIAGNOSIS — R509 Fever, unspecified: Secondary | ICD-10-CM

## 2022-10-06 DIAGNOSIS — Z95828 Presence of other vascular implants and grafts: Secondary | ICD-10-CM | POA: Diagnosis not present

## 2022-10-06 DIAGNOSIS — Z6830 Body mass index (BMI) 30.0-30.9, adult: Secondary | ICD-10-CM

## 2022-10-06 DIAGNOSIS — M7989 Other specified soft tissue disorders: Secondary | ICD-10-CM

## 2022-10-06 DIAGNOSIS — R785 Finding of other psychotropic drug in blood: Secondary | ICD-10-CM | POA: Diagnosis not present

## 2022-10-06 DIAGNOSIS — Z7901 Long term (current) use of anticoagulants: Secondary | ICD-10-CM

## 2022-10-06 DIAGNOSIS — I82421 Acute embolism and thrombosis of right iliac vein: Secondary | ICD-10-CM | POA: Diagnosis not present

## 2022-10-06 DIAGNOSIS — Z8639 Personal history of other endocrine, nutritional and metabolic disease: Secondary | ICD-10-CM

## 2022-10-06 LAB — COMPREHENSIVE METABOLIC PANEL
ALT: 21 U/L (ref 0–44)
AST: 18 U/L (ref 15–41)
Albumin: 3.9 g/dL (ref 3.5–5.0)
Alkaline Phosphatase: 69 U/L (ref 38–126)
Anion gap: 10 (ref 5–15)
BUN: 24 mg/dL — ABNORMAL HIGH (ref 8–23)
CO2: 21 mmol/L — ABNORMAL LOW (ref 22–32)
Calcium: 8.8 mg/dL — ABNORMAL LOW (ref 8.9–10.3)
Chloride: 106 mmol/L (ref 98–111)
Creatinine, Ser: 1.29 mg/dL — ABNORMAL HIGH (ref 0.61–1.24)
GFR, Estimated: 59 mL/min — ABNORMAL LOW (ref >60)
Glucose, Bld: 124 mg/dL — ABNORMAL HIGH (ref 70–99)
Potassium: 4.5 mmol/L (ref 3.5–5.1)
Sodium: 137 mmol/L (ref 135–145)
Total Bilirubin: 1.3 mg/dL — ABNORMAL HIGH (ref 0.3–1.2)
Total Protein: 7.3 g/dL (ref 6.5–8.1)

## 2022-10-06 LAB — CBC
HCT: 39 % (ref 39.0–52.0)
Hemoglobin: 12.7 g/dL — ABNORMAL LOW (ref 13.0–17.0)
MCH: 29.8 pg (ref 26.0–34.0)
MCHC: 32.6 g/dL (ref 30.0–36.0)
MCV: 91.5 fL (ref 80.0–100.0)
Platelets: 162 10*3/uL (ref 150–400)
RBC: 4.26 MIL/uL (ref 4.22–5.81)
RDW: 14.1 % (ref 11.5–15.5)
WBC: 9.7 10*3/uL (ref 4.0–10.5)
nRBC: 0 % (ref 0.0–0.2)

## 2022-10-06 LAB — LACTIC ACID, PLASMA
Lactic Acid, Venous: 1.6 mmol/L (ref 0.5–1.9)
Lactic Acid, Venous: 1.1 mmol/L (ref 0.5–1.9)

## 2022-10-06 LAB — APTT
aPTT: 30 seconds (ref 24–36)
aPTT: 29 seconds (ref 24–36)

## 2022-10-06 LAB — PROTIME-INR
INR: 1.2 (ref 0.8–1.2)
Prothrombin Time: 15.2 seconds (ref 11.4–15.2)

## 2022-10-06 MED ORDER — ACETAMINOPHEN 500 MG PO TABS
1000.0000 mg | ORAL_TABLET | Freq: Once | ORAL | Status: AC
  Administered 2022-10-06: 1000 mg via ORAL
  Filled 2022-10-06: qty 2

## 2022-10-06 MED ORDER — ONDANSETRON HCL 4 MG PO TABS: 4.0000 mg | ORAL_TABLET | Freq: Four times a day (QID) | ORAL | Status: DC | PRN

## 2022-10-06 MED ORDER — ACETAMINOPHEN 325 MG PO TABS
650.0000 mg | ORAL_TABLET | Freq: Four times a day (QID) | ORAL | Status: DC | PRN
  Administered 2022-10-07: 650 mg via ORAL
  Filled 2022-10-06: qty 2

## 2022-10-06 MED ORDER — IBUPROFEN 600 MG PO TABS
600.0000 mg | ORAL_TABLET | Freq: Once | ORAL | Status: AC
  Administered 2022-10-06: 600 mg via ORAL
  Filled 2022-10-06: qty 1

## 2022-10-06 MED ORDER — LINEZOLID 600 MG/300ML IV SOLN
600.0000 mg | Freq: Once | INTRAVENOUS | Status: AC
  Administered 2022-10-06: 600 mg via INTRAVENOUS
  Filled 2022-10-06: qty 300

## 2022-10-06 MED ORDER — IOHEXOL 350 MG/ML SOLN
75.0000 mL | Freq: Once | INTRAVENOUS | Status: AC | PRN
  Administered 2022-10-06: 75 mL via INTRAVENOUS

## 2022-10-06 MED ORDER — HYDROCODONE-ACETAMINOPHEN 5-325 MG PO TABS: 1.0000 | ORAL_TABLET | Freq: Four times a day (QID) | ORAL | Status: DC | PRN

## 2022-10-06 MED ORDER — ATORVASTATIN CALCIUM 20 MG PO TABS
20.0000 mg | ORAL_TABLET | Freq: Every day | ORAL | Status: DC
  Administered 2022-10-06 – 2022-10-08 (×3): 20 mg via ORAL
  Filled 2022-10-06 (×3): qty 1

## 2022-10-06 MED ORDER — SODIUM CHLORIDE 0.9 % IV BOLUS
1000.0000 mL | Freq: Once | INTRAVENOUS | Status: AC
  Administered 2022-10-06: 1000 mL via INTRAVENOUS

## 2022-10-06 MED ORDER — SODIUM CHLORIDE 0.9 % IV SOLN
2.0000 g | Freq: Once | INTRAVENOUS | Status: AC
  Administered 2022-10-06: 2 g via INTRAVENOUS
  Filled 2022-10-06: qty 12.5

## 2022-10-06 MED ORDER — HYDRALAZINE HCL 10 MG PO TABS: 10.0000 mg | ORAL_TABLET | Freq: Four times a day (QID) | ORAL | Status: DC | PRN

## 2022-10-06 MED ORDER — SENNOSIDES-DOCUSATE SODIUM 8.6-50 MG PO TABS: 1.0000 | ORAL_TABLET | Freq: Every evening | ORAL | Status: DC | PRN

## 2022-10-06 MED ORDER — ONDANSETRON HCL 4 MG/2ML IJ SOLN: 4.0000 mg | Freq: Four times a day (QID) | INTRAMUSCULAR | Status: DC | PRN

## 2022-10-06 MED ORDER — SODIUM CHLORIDE 0.9 % IV SOLN
0.1950 mg/kg/h | INTRAVENOUS | Status: AC
  Administered 2022-10-06: 0.15 mg/kg/h via INTRAVENOUS
  Administered 2022-10-07 – 2022-10-08 (×2): 0.195 mg/kg/h via INTRAVENOUS
  Filled 2022-10-06 (×3): qty 250

## 2022-10-06 MED ORDER — ACETAMINOPHEN 650 MG RE SUPP: 650.0000 mg | Freq: Four times a day (QID) | RECTAL | Status: DC | PRN

## 2022-10-06 NOTE — H&P (View-Only) (Signed)
[facility] Vascular Consult Note  [name] : [id number]  Russell [name] is a 71 y.o. (1951-08-01) male who presents with chief complaint of  Chief Complaint  Patient presents with   Leg Pain  .   Consulting Physician: Lucillie Garfinkel, MD Reason for consult: Deep vein thrombosis History of Present Illness: Russell [name] is a 71 year old male who presents to [facility] following worsening leg pain and swelling.  The patient was previously diagnosed with a DVT in May 2023.  He completed the initial packet of Eliquis he was prescribed however he did not refill his Eliquis due to cost.  Today he notes that his right lower extremity is painful and very swollen.  The patient underwent a CTA to evaluate for pulmonary embolism which was negative.  There is extensive DVT in the right lower extremity extending from the tibial vessels to the common femoral vein  Current Facility-Administered Medications  Medication Dose Route Frequency Provider Last Rate Last Admin   acetaminophen (TYLENOL) tablet 650 mg  650 mg Oral Q6H PRN Cox, Amy N, DO       Or   acetaminophen (TYLENOL) suppository 650 mg  650 mg Rectal Q6H PRN Cox, Amy N, DO       atorvastatin (LIPITOR) tablet 20 mg  20 mg Oral QHS Cox, Amy N, DO   20 mg at 10/06/22 2204   bivalirudin (ANGIOMAX) 250 mg in sodium chloride 0.9 % 500 mL (0.5 mg/mL) infusion  0.195 mg/kg/hr Intravenous Continuous Vira Blanco, RPH 36.6 mL/hr at 10/06/22 2038 0.195 mg/kg/hr at 10/06/22 2038   hydrALAZINE (APRESOLINE) tablet 10 mg  10 mg Oral Q6H PRN Cox, Amy N, DO       HYDROcodone-acetaminophen (NORCO/VICODIN) 5-325 MG per tablet 1 tablet  1 tablet Oral Q6H PRN Cox, Amy N, DO       ondansetron (ZOFRAN) tablet 4 mg  4 mg Oral Q6H PRN Cox, Amy N, DO       Or   ondansetron (ZOFRAN) injection 4 mg  4 mg Intravenous Q6H PRN Cox, Amy N, DO       senna-docusate (Senokot-S) tablet 1 tablet  1 tablet Oral QHS PRN Cox, Amy N, DO         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   Hyperlipidemia    Hypertension     Past Surgical History:  Procedure Laterality Date   BACK SURGERY     COLONOSCOPY WITH PROPOFOL N/A 11/05/2019   Procedure: COLONOSCOPY WITH PROPOFOL;  Surgeon: [name], MD;  [facility] ENDOSCOPY;  Service: Gastroenterology;  Laterality: N/A;   PARTIAL COLECTOMY N/A 02/11/2020   Procedure: PARTIAL COLECTOMY -- sigmoid;  Surgeon: Olean Ree, MD;  Location: [facility] ORS;  Service: General;  Laterality: N/A;   SPINE SURGERY     TAKE DOWN OF INTESTINAL FISTULA N/A 02/11/2020   Procedure: TAKE DOWN OF INTESTINAL FISTULA -- colovesical fistula;  Surgeon: Olean Ree, MD;  Location: [facility] ORS;  Service: General;  Laterality: N/A;   TRACHEAL SURGERY      Social History Social History   Tobacco Use   Smoking status: Never   Smokeless tobacco: Never  Vaping Use   Vaping Use: Never used  Substance Use Topics   Alcohol use: No   Drug use: No    Family History Family History  Problem Relation Age of Onset   Prostate cancer Neg Hx  Bladder Cancer Neg Hx    Kidney cancer Neg Hx     Allergies  Allergen Reactions   Penicillins Rash    Beta-lactam allergy, DRESS Beta-lactam allergy, DRESS    Shrimp [Shellfish Allergy] Hives   Cisatracurium Other (See Comments)    Other reaction(s): Unknown Other reaction(s): Unknown    Heparin Hives and Swelling    Patient's daughter reports patient had swelling/hives during hospital admission in [place]. They were on several medications at once so she is unsure whether it was VANCOMYCIN that caused this reaction or another concomitant medication.    Rocuronium Other (See Comments)    Other reaction(s): Unknown Other reaction(s): Unknown    Vancomycin Hives and Swelling    Patient's daughter reports patient had swelling/hives during hospital admission in [place]. They were on several medications at  once so she is unsure whether it was VANCOMYCIN that caused this reaction or another concomitant medication.      REVIEW OF SYSTEMS (Negative unless checked)  Constitutional: '[]'$ Weight loss  '[]'$ Fever  '[]'$ Chills Cardiac: '[]'$ Chest pain   '[]'$ Chest pressure   '[]'$ Palpitations   '[]'$ Shortness of breath when laying flat   '[]'$ Shortness of breath at rest   '[]'$ Shortness of breath with exertion. Vascular:  '[]'$ Pain in legs with walking   '[]'$ Pain in legs at rest   '[]'$ Pain in legs when laying flat   '[]'$ Claudication   '[]'$ Pain in feet when walking  '[]'$ Pain in feet at rest  '[]'$ Pain in feet when laying flat   '[]'$ History of DVT   '[]'$ Phlebitis   '[x]'$ Swelling in legs   '[]'$ Varicose veins   '[]'$ Non-healing ulcers Pulmonary:   '[]'$ Uses home oxygen   '[]'$ Productive cough   '[]'$ Hemoptysis   '[]'$ Wheeze  '[]'$ COPD   '[]'$ Asthma Neurologic:  '[]'$ Dizziness  '[]'$ Blackouts   '[]'$ Seizures   '[]'$ History of stroke   '[]'$ History of TIA  '[]'$ Aphasia   '[]'$ Temporary blindness   '[]'$ Dysphagia   '[]'$ Weakness or numbness in arms   '[]'$ Weakness or numbness in legs Musculoskeletal:  '[]'$ Arthritis   '[]'$ Joint swelling   '[]'$ Joint pain   '[]'$ Low back pain Hematologic:  '[]'$ Easy bruising  '[]'$ Easy bleeding   '[]'$ Hypercoagulable state   '[]'$ Anemic  '[]'$ Hepatitis Gastrointestinal:  '[]'$ Blood in stool   '[]'$ Vomiting blood  '[]'$ Gastroesophageal reflux/heartburn   '[]'$ Difficulty swallowing. Genitourinary:  '[]'$ Chronic kidney disease   '[]'$ Difficult urination  '[]'$ Frequent urination  '[]'$ Burning with urination   '[]'$ Blood in urine Skin:  '[]'$ Rashes   '[]'$ Ulcers   '[]'$ Wounds Psychological:  '[]'$ History of anxiety   '[]'$  History of major depression.  Physical Examination  Vitals:   10/06/22 1700 10/06/22 1715 10/06/22 2019 10/06/22 2055  BP: 97/74  101/67 102/71  Pulse: (!) 101 97 83 83  Resp: (!) 28 (!) '24 15 20  '$ Temp:   97.8 F (36.6 C) 97.9 F (36.6 C)  TempSrc:   Oral Oral  SpO2: 94% 94% 95% 98%  Weight:      Height:       Body mass index is 30.56 kg/m. Gen:  WD/WN, NAD Head: [place]/AT, No temporalis wasting. Prominent temp pulse not  noted. Ear/Nose/Throat: Hearing grossly intact, nares w/o erythema or drainage, oropharynx w/o Erythema/Exudate Eyes: Sclera non-icteric, conjunctiva clear Neck: Trachea midline.  No JVD.  Pulmonary:  Good air movement, respirations not labored, equal bilaterally.  Cardiac: RRR, normal S1, S2. Vascular: Right lower extremity edema 2+ Vessel Right Left  Radial Palpable Palpable   Gastrointestinal: soft, non-tender/non-distended. No guarding/reflex.  Musculoskeletal: M/S 5/5 throughout.  Extremities without ischemic changes.  No deformity or atrophy.  Neurologic:  Sensation grossly intact in extremities.  Symmetrical.  Speech is fluent. Motor exam as listed above. Psychiatric: Judgment intact, Mood & affect appropriate for pt's clinical situation. Dermatologic: No rashes or ulcers noted.  No cellulitis or open wounds. Lymph : No Cervical, Axillary, or Inguinal lymphadenopathy.    CBC Lab Results  Component Value Date   WBC 9.7 10/06/2022   HGB 12.7 (L) 10/06/2022   HCT 39.0 10/06/2022   MCV 91.5 10/06/2022   PLT 162 10/06/2022    BMET    Component Value Date/Time   NA 137 10/06/2022 1416   NA 143 10/14/2019 1424   K 4.5 10/06/2022 1416   CL 106 10/06/2022 1416   CO2 21 (L) 10/06/2022 1416   GLUCOSE 124 (H) 10/06/2022 1416   BUN 24 (H) 10/06/2022 1416   BUN 16 10/14/2019 1424   CREATININE 1.29 (H) 10/06/2022 1416   CALCIUM 8.8 (L) 10/06/2022 1416   GFRNONAA 59 (L) 10/06/2022 1416   GFRAA >60 05/03/2020 0604   Estimated Creatinine Clearance: 60.3 mL/min (A) (by C-G formula based on SCr of 1.29 mg/dL (H)).  COAG Lab Results  Component Value Date   INR 1.2 10/06/2022    Radiology CT Angio Chest Pulmonary Embolism (PE) W or WO Contrast  Result Date: 10/06/2022 CLINICAL DATA:  Tachycardia, history of DVT in right lower extremity EXAM: CT ANGIOGRAPHY CHEST WITH CONTRAST TECHNIQUE: Multidetector CT imaging of the chest was performed using the standard protocol during  bolus administration of intravenous contrast. Multiplanar CT image reconstructions and MIPs were obtained to evaluate the vascular anatomy. RADIATION DOSE REDUCTION: This exam was performed according to the departmental dose-optimization program which includes automated exposure control, adjustment of the mA and/or kV according to patient size and/or use of iterative reconstruction technique. CONTRAST:  69m OMNIPAQUE IOHEXOL 350 MG/ML SOLN COMPARISON:  CT chest done on 06/11/2022 FINDINGS: Cardiovascular: There is homogeneous enhancement in thoracic aorta. Calcifications are seen in mitral annulus. There are no intraluminal filling defects in central pulmonary artery branches. Evaluation of small peripheral branches is limited by motion artifacts, especially in the lower lung fields. Mediastinum/Nodes: No significant lymphadenopathy is seen. Lungs/Pleura: Increased interstitial markings are seen in the periphery of both lungs, more so in the lower lung fields. There is no focal consolidation. There is slight improvement in aeration of lower lung fields. There is no pleural effusion or pneumothorax. Upper Abdomen: There is fatty infiltration in liver. There is subtle increased density in the dependent portion of gallbladder. There is no wall thickening in gallbladder. Small hiatal hernia is seen. Musculoskeletal: There is laminectomy and surgical fusion in the thoracic spine there is a metallic structure embedded in the bodies of T3, T4 and T5 vertebrae. Review of the MIP images confirms the above findings. IMPRESSION: There is no evidence of central pulmonary artery embolism. There is no evidence of thoracic aortic dissection. Increased interstitial markings are seen in both lungs, more so in the lower lung fields. 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 Possibility of superimposed interstitial pneumonia is not excluded. There is no pleural effusion or pneumothorax. Small hiatal hernia.  There is subtle increased density in the dependent portion of gallbladder suggesting tiny gallbladder stones or sludge. Other findings as described in the body of the report. Electronically Signed   By: PElmer PickerM.D.   On: 10/06/2022 16:13   [place] Img Lower Unilateral Right (DVT)  Result Date: 10/06/2022 CLINICAL DATA:  Right lower extremity edema EXAM: RIGHT LOWER EXTREMITY VENOUS  DOPPLER ULTRASOUND TECHNIQUE: Gray-scale sonography with graded compression, as well as color Doppler and duplex ultrasound were performed to evaluate the lower extremity deep venous systems from the level of the common femoral vein and including the common femoral, femoral, profunda femoral, popliteal and calf veins including the posterior tibial, peroneal and gastrocnemius veins when visible. The superficial great saphenous vein was also interrogated. Spectral Doppler was utilized to evaluate flow at rest and with distal augmentation maneuvers in the common femoral, femoral and popliteal veins. COMPARISON:  None Available. FINDINGS: Contralateral Common Femoral Vein: Respiratory phasicity is normal and symmetric with the symptomatic side. No evidence of thrombus. Normal compressibility. Common Femoral Vein: The common femoral vein is not compressible. The vein is expanded and filled with low-level internal echoes. Findings are consistent with acute occlusive DVT. No evidence of color flow on color Doppler imaging. Saphenofemoral Junction: Occlusive DVT extends into the saphenofemoral junction. The remainder of the great saphenous vein is patent. Profunda Femoral Vein: Occlusive thrombus extends into the profunda femoral vein. Femoral Vein: Occlusive thrombus extends into the femoral vein throughout the thigh. Popliteal Vein: Occlusive thrombus extends into the popliteal vein. Calf Veins: Occlusive thrombus extends into the posterior tibial veins. Superficial Great Saphenous Vein: No evidence of thrombus. Normal  compressibility. Venous Reflux:  None. Other Findings:  None. IMPRESSION: Positive for extensive acute occlusive DVT throughout the right lower extremity from the common femoral vein into the calf. These results will be called to the ordering clinician or representative by the Radiologist Assistant, and communication documented in the PACS or [employer]. Electronically Signed   By: Jacqulynn Cadet M.D.   On: 10/06/2022 11:52   DG Lumbar Spine 2-3 Views  Result Date: 09/22/2022 CLINICAL DATA:  Low back pain and left leg pain EXAM: LUMBAR SPINE - 2-3 VIEW COMPARISON:  09/01/2021 FINDINGS: There is no evidence of acute lumbar spine fracture. Alignment is normal. Intervertebral disc spaces are maintained. Mild lower lumbar facet arthropathy. Unchanged appearance of the IVC filter with fractured limb. Multiple vascular coils within the abdomen. IMPRESSION: Mild lower lumbar facet arthropathy. No acute findings. Electronically Signed   By: Davina Poke D.O.   On: 09/22/2022 13:43      Assessment/Plan 1.  Deep vein thrombosis  We discussed thrombectomy with patient in order to help treat the significant symptoms of pain and swelling.  We discussed the risk, procedure, benefits and alternatives and the patient is agreeable to proceed.  Is also emphasized to the patient that he needs to remain on his Eliquis once he is discharged to prevent recurrence.  We will plan on having the patient proceed with intervention Tomorrow.  Patient will be n.p.o. until night.  No evidence of pulmonary embolism noted. 2.  Hyperlipidemia Continue statin   Plan of care discussed with Dr.Dew and he is in agreement with plan noted above.   Family Communication:  Total Time:75 minutes I spent 75 minutes in this encounter including personally reviewing extensive medical records, personally reviewing imaging studies and compared to prior scans, counseling the patient, placing orders, coordinating care and performing  appropriate documentation  Thank you for allowing [place] to participate in the care of this patient.   [name], NP [facility] [phone] (Office Phone) [phone] (Office Fax) [phone] (Pager)  10/06/2022 11:16 PM  Staff may message me via secure chat in [place]  but this may not receive immediate response,  please page for urgent matters!  Dictation software was used to generate  the above note. Typos may occur and escape review, as with typed/written notes. Any error is purely unintentional.  Please contact me directly for clarity if needed.

## 2022-10-06 NOTE — ED Notes (Signed)
Notified lab of the need for [facility] x1 to be drawn. 1 set obtained with [place] IV.

## 2022-10-06 NOTE — ED Triage Notes (Signed)
Pt here with right lower leg blood clot, pt had venous US performed this morning. Pt has had the leg pain x2 weeks.

## 2022-10-06 NOTE — Consult Note (Signed)
Russell [name]  [name] : [id number]  Russell [name] is a 71 y.o. (1951/06/09) male who presents with chief complaint of  Chief Complaint  Patient presents with   Leg Pain  .   Consulting Physician: Lucillie Garfinkel, MD Reason for consult: Deep vein thrombosis History of Present Illness: Russell [name] is a 71 year old male who presents to [facility] following worsening leg pain and swelling.  The patient was previously diagnosed with a DVT in May 2023.  He completed the initial packet of Eliquis he was prescribed however he did not refill his Eliquis due to cost.  Today he notes that his right lower extremity is painful and very swollen.  The patient underwent a CTA to evaluate for pulmonary embolism which was negative.  There is extensive DVT in the right lower extremity extending from the tibial vessels to the common femoral vein  Current Facility-Administered Medications  Medication Dose Route Frequency Provider Last Rate Last Admin   acetaminophen (TYLENOL) tablet 650 mg  650 mg Oral Q6H PRN Cox, Amy N, DO       Or   acetaminophen (TYLENOL) suppository 650 mg  650 mg Rectal Q6H PRN Cox, Amy N, DO       atorvastatin (LIPITOR) tablet 20 mg  20 mg Oral QHS Cox, Amy N, DO   20 mg at 10/06/22 2204   bivalirudin (ANGIOMAX) 250 mg in sodium chloride 0.9 % 500 mL (0.5 mg/mL) infusion  0.195 mg/kg/hr Intravenous Continuous Vira Blanco, RPH 36.6 mL/hr at 10/06/22 2038 0.195 mg/kg/hr at 10/06/22 2038   hydrALAZINE (APRESOLINE) tablet 10 mg  10 mg Oral Q6H PRN Cox, Amy N, DO       HYDROcodone-acetaminophen (NORCO/VICODIN) 5-325 MG per tablet 1 tablet  1 tablet Oral Q6H PRN Cox, Amy N, DO       ondansetron (ZOFRAN) tablet 4 mg  4 mg Oral Q6H PRN Cox, Amy N, DO       Or   ondansetron (ZOFRAN) injection 4 mg  4 mg Intravenous Q6H PRN Cox, Amy N, DO       senna-docusate (Senokot-S) tablet 1 tablet  1 tablet Oral QHS PRN Cox, Amy N, DO         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   Hyperlipidemia    Hypertension     Past Surgical History:  Procedure Laterality Date   BACK SURGERY     COLONOSCOPY WITH PROPOFOL N/A 11/05/2019   Procedure: COLONOSCOPY WITH PROPOFOL;  Surgeon: [name], MD;  Location: [facility];  Service: Gastroenterology;  Laterality: N/A;   PARTIAL COLECTOMY N/A 02/11/2020   Procedure: PARTIAL COLECTOMY -- sigmoid;  Surgeon: Olean Ree, MD;  Location: [facility] ORS;  Service: General;  Laterality: N/A;   SPINE SURGERY     TAKE DOWN OF INTESTINAL FISTULA N/A 02/11/2020   Procedure: TAKE DOWN OF INTESTINAL FISTULA -- colovesical fistula;  Surgeon: Olean Ree, MD;  Location: [facility] ORS;  Service: General;  Laterality: N/A;   TRACHEAL SURGERY      Social History Social History   Tobacco Use   Smoking status: Never   Smokeless tobacco: Never  Vaping Use   Vaping Use: Never used  Substance Use Topics   Alcohol use: No   Drug use: No    Family History Family History  Problem Relation Age of Onset   Prostate cancer Neg Hx  Bladder Cancer Neg Hx    Kidney cancer Neg Hx     Allergies  Allergen Reactions   Penicillins Rash    Beta-lactam allergy, DRESS Beta-lactam allergy, DRESS    Shrimp [Shellfish Allergy] Hives   Cisatracurium Other (See Comments)    Other reaction(s): Unknown Other reaction(s): Unknown    Heparin Hives and Swelling    Patient's daughter reports patient had swelling/hives during hospital admission in [place]. They were on several medications at once so she is unsure whether it was VANCOMYCIN that caused this reaction or another concomitant medication.    Rocuronium Other (See Comments)    Other reaction(s): Unknown Other reaction(s): Unknown    Vancomycin Hives and Swelling    Patient's daughter reports patient had swelling/hives during hospital admission in [place]. They were on several medications at  once so she is unsure whether it was VANCOMYCIN that caused this reaction or another concomitant medication.      REVIEW OF SYSTEMS (Negative unless checked)  Constitutional: '[]'$ Weight loss  '[]'$ Fever  '[]'$ Chills Cardiac: '[]'$ Chest pain   '[]'$ Chest pressure   '[]'$ Palpitations   '[]'$ Shortness of breath when laying flat   '[]'$ Shortness of breath at rest   '[]'$ Shortness of breath with exertion. Vascular:  '[]'$ Pain in legs with walking   '[]'$ Pain in legs at rest   '[]'$ Pain in legs when laying flat   '[]'$ Claudication   '[]'$ Pain in feet when walking  '[]'$ Pain in feet at rest  '[]'$ Pain in feet when laying flat   '[]'$ History of DVT   '[]'$ Phlebitis   '[x]'$ Swelling in legs   '[]'$ Varicose veins   '[]'$ Non-healing ulcers Pulmonary:   '[]'$ Uses home oxygen   '[]'$ Productive cough   '[]'$ Hemoptysis   '[]'$ Wheeze  '[]'$ COPD   '[]'$ Asthma Neurologic:  '[]'$ Dizziness  '[]'$ Blackouts   '[]'$ Seizures   '[]'$ History of stroke   '[]'$ History of TIA  '[]'$ Aphasia   '[]'$ Temporary blindness   '[]'$ Dysphagia   '[]'$ Weakness or numbness in arms   '[]'$ Weakness or numbness in legs Musculoskeletal:  '[]'$ Arthritis   '[]'$ Joint swelling   '[]'$ Joint pain   '[]'$ Low back pain Hematologic:  '[]'$ Easy bruising  '[]'$ Easy bleeding   '[]'$ Hypercoagulable state   '[]'$ Anemic  '[]'$ Hepatitis Gastrointestinal:  '[]'$ Blood in stool   '[]'$ Vomiting blood  '[]'$ Gastroesophageal reflux/heartburn   '[]'$ Difficulty swallowing. Genitourinary:  '[]'$ Chronic kidney disease   '[]'$ Difficult urination  '[]'$ Frequent urination  '[]'$ Burning with urination   '[]'$ Blood in urine Skin:  '[]'$ Rashes   '[]'$ Ulcers   '[]'$ Wounds Psychological:  '[]'$ History of anxiety   '[]'$  History of major depression.  Physical Examination  Vitals:   10/06/22 1700 10/06/22 1715 10/06/22 2019 10/06/22 2055  BP: 97/74  101/67 102/71  Pulse: (!) 101 97 83 83  Resp: (!) 28 (!) '24 15 20  '$ Temp:   97.8 F (36.6 C) 97.9 F (36.6 C)  TempSrc:   Oral Oral  SpO2: 94% 94% 95% 98%  Weight:      Height:       Body mass index is 30.56 kg/m. Gen:  WD/WN, NAD Head: Russell [name]/AT, No temporalis wasting. Prominent temp pulse not  noted. Ear/Nose/Throat: Hearing grossly intact, nares w/o erythema or drainage, oropharynx w/o Erythema/Exudate Eyes: Sclera non-icteric, conjunctiva clear Neck: Trachea midline.  No JVD.  Pulmonary:  Good air movement, respirations not labored, equal bilaterally.  Cardiac: RRR, normal S1, S2. Vascular: Right lower extremity edema 2+ Vessel Right Left  Radial Palpable Palpable   Gastrointestinal: soft, non-tender/non-distended. No guarding/reflex.  Musculoskeletal: M/S 5/5 throughout.  Extremities without ischemic changes.  No deformity or atrophy.  Neurologic:  Sensation grossly intact in extremities.  Symmetrical.  Speech is fluent. Motor exam as listed above. Psychiatric: Judgment intact, Mood & affect appropriate for pt's clinical situation. Dermatologic: No rashes or ulcers noted.  No cellulitis or open wounds. Lymph : No Cervical, Axillary, or Inguinal lymphadenopathy.    CBC Lab Results  Component Value Date   WBC 9.7 10/06/2022   HGB 12.7 (L) 10/06/2022   HCT 39.0 10/06/2022   MCV 91.5 10/06/2022   PLT 162 10/06/2022    BMET    Component Value Date/Time   NA 137 10/06/2022 1416   NA 143 10/14/2019 1424   K 4.5 10/06/2022 1416   CL 106 10/06/2022 1416   CO2 21 (L) 10/06/2022 1416   GLUCOSE 124 (H) 10/06/2022 1416   BUN 24 (H) 10/06/2022 1416   BUN 16 10/14/2019 1424   CREATININE 1.29 (H) 10/06/2022 1416   CALCIUM 8.8 (L) 10/06/2022 1416   GFRNONAA 59 (L) 10/06/2022 1416   GFRAA >60 05/03/2020 0604   Estimated Creatinine Clearance: 60.3 mL/min (A) (by C-G formula based on SCr of 1.29 mg/dL (H)).  COAG Lab Results  Component Value Date   INR 1.2 10/06/2022    Radiology CT Angio Chest Pulmonary Embolism (PE) W or WO Contrast  Result Date: 10/06/2022 CLINICAL DATA:  Tachycardia, history of DVT in right lower extremity EXAM: CT ANGIOGRAPHY CHEST WITH CONTRAST TECHNIQUE: Multidetector CT imaging of the chest was performed using the standard protocol during  bolus administration of intravenous contrast. Multiplanar CT image reconstructions and MIPs were obtained to evaluate the vascular anatomy. RADIATION DOSE REDUCTION: This exam was performed according to the departmental dose-optimization program which includes automated exposure control, adjustment of the mA and/or kV according to patient size and/or use of iterative reconstruction technique. CONTRAST:  52m OMNIPAQUE IOHEXOL 350 MG/ML SOLN COMPARISON:  CT chest done on 06/11/2022 FINDINGS: Cardiovascular: There is homogeneous enhancement in thoracic aorta. Calcifications are seen in mitral annulus. There are no intraluminal filling defects in central pulmonary artery branches. Evaluation of small peripheral branches is limited by motion artifacts, especially in the lower lung fields. Mediastinum/Nodes: No significant lymphadenopathy is seen. Lungs/Pleura: Increased interstitial markings are seen in the periphery of both lungs, more so in the lower lung fields. There is no focal consolidation. There is slight improvement in aeration of lower lung fields. There is no pleural effusion or pneumothorax. Upper Abdomen: There is fatty infiltration in liver. There is subtle increased density in the dependent portion of gallbladder. There is no wall thickening in gallbladder. Small hiatal hernia is seen. Musculoskeletal: There is laminectomy and surgical fusion in the thoracic spine there is a metallic structure embedded in the bodies of T3, T4 and T5 vertebrae. Review of the MIP images confirms the above findings. IMPRESSION: There is no evidence of central pulmonary artery embolism. There is no evidence of thoracic aortic dissection. Increased interstitial markings are seen in both lungs, more so in the lower lung fields. 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 Possibility of superimposed interstitial pneumonia is not excluded. There is no pleural effusion or pneumothorax. Small hiatal hernia.  There is subtle increased density in the dependent portion of gallbladder suggesting tiny gallbladder stones or sludge. Other findings as described in the body of the report. Electronically Signed   By: PElmer PickerM.D.   On: 10/06/2022 16:13   [place] Img Lower Unilateral Right (DVT)  Result Date: 10/06/2022 CLINICAL DATA:  Right lower extremity edema EXAM: RIGHT LOWER EXTREMITY VENOUS  DOPPLER ULTRASOUND TECHNIQUE: Gray-scale sonography with graded compression, as well as color Doppler and duplex ultrasound were performed to evaluate the lower extremity deep venous systems from the level of the common femoral vein and including the common femoral, femoral, profunda femoral, popliteal and calf veins including the posterior tibial, peroneal and gastrocnemius veins when visible. The superficial great saphenous vein was also interrogated. Spectral Doppler was utilized to evaluate flow at rest and with distal augmentation maneuvers in the common femoral, femoral and popliteal veins. COMPARISON:  None Available. FINDINGS: Contralateral Common Femoral Vein: Respiratory phasicity is normal and symmetric with the symptomatic side. No evidence of thrombus. Normal compressibility. Common Femoral Vein: The common femoral vein is not compressible. The vein is expanded and filled with low-level internal echoes. Findings are consistent with acute occlusive DVT. No evidence of color flow on color Doppler imaging. Saphenofemoral Junction: Occlusive DVT extends into the saphenofemoral junction. The remainder of the great saphenous vein is patent. Profunda Femoral Vein: Occlusive thrombus extends into the profunda femoral vein. Femoral Vein: Occlusive thrombus extends into the femoral vein throughout the thigh. Popliteal Vein: Occlusive thrombus extends into the popliteal vein. Calf Veins: Occlusive thrombus extends into the posterior tibial veins. Superficial Great Saphenous Vein: No evidence of thrombus. Normal  compressibility. Venous Reflux:  None. Other Findings:  None. IMPRESSION: Positive for extensive acute occlusive DVT throughout the right lower extremity from the common femoral vein into the calf. These results will be called to the ordering clinician or representative by the Radiologist Assistant, and communication documented in the PACS or [employer]. Electronically Signed   By: Jacqulynn Cadet M.D.   On: 10/06/2022 11:52   DG Lumbar Spine 2-3 Views  Result Date: 09/22/2022 CLINICAL DATA:  Low back pain and left leg pain EXAM: LUMBAR SPINE - 2-3 VIEW COMPARISON:  09/01/2021 FINDINGS: There is no evidence of acute lumbar spine fracture. Alignment is normal. Intervertebral disc spaces are maintained. Mild lower lumbar facet arthropathy. Unchanged appearance of the IVC filter with fractured limb. Multiple vascular coils within the abdomen. IMPRESSION: Mild lower lumbar facet arthropathy. No acute findings. Electronically Signed   By: Davina Poke D.O.   On: 09/22/2022 13:43      Assessment/Plan 1.  Deep vein thrombosis  We discussed thrombectomy with patient in order to help treat the significant symptoms of pain and swelling.  We discussed the risk, procedure, benefits and alternatives and the patient is agreeable to proceed.  Is also emphasized to the patient that he needs to remain on his Eliquis once he is discharged to prevent recurrence.  We will plan on having the patient proceed with intervention Tomorrow.  Patient will be n.p.o. until night.  No evidence of pulmonary embolism noted. 2.  Hyperlipidemia Continue statin   Plan of care discussed with Dr.Dew and he is in agreement with plan noted above.   Family Communication:  Total Time:75 minutes I spent 75 minutes in this encounter including personally reviewing extensive medical records, personally reviewing imaging studies and compared to prior scans, counseling the patient, placing orders, coordinating care and performing  appropriate documentation  Thank you for allowing [place] to participate in the care of this patient.   [name], NP [facility] [phone] (Office Phone) [phone] (Office Fax) [phone] (Pager)  10/06/2022 11:16 PM  Staff may message me via secure chat in [place]  but this may not receive immediate response,  please page for urgent matters!  Dictation software was used to generate  the above [name]. Typos may occur and escape review, as with typed/written notes. Any error is purely unintentional.  Please contact me directly for clarity if needed.

## 2022-10-06 NOTE — H&P (Addendum)
History and Physical   Russell [name] QMV:[id number] DOB: May 25, 1951 DOA: 10/06/2022  PCP: Norm Parcel, MD Patient coming from: Home  I have personally briefly reviewed patient's old medical records in [place].  Chief Concern: DVT  HPI: [name] is a 71 year old male with history of DVT in May 2020, hyperlipidemia, who presents emergency department for chief concerns of right leg pain with DVT on ultrasound outpatient.  Initial vitals in the emergency department showed temperature 99.5 with elevation to 102.9, respiration rate of 18, heart rate of 96, blood pressure 124/72, SPO2 of 94% on room air.  Serum sodium is 142, potassium 4.1, chloride 112, bicarb 23, BUN of 17, serum creatinine of 0.92, nonfasting glucose of 100, GFR greater than 60, WBC 7.2, hemoglobin 12.7, platelets of 162.  ED treatment: Tylenol 1 g, ibuprofen 600 mg p.o.  Patient was started on bivalirudin gtt., cefepime, linezolid, sodium chloride 1 L bolus.  EDP consulted vascular who states they will see the patient with possible thrombectomy on 10/07/2022. --------------------- Spanish speaking interpreter utilized at bedside.  He appears stable and was able to tell me his name, age, current location and he knows the current calendar year.  Patient reports that over the last 2 weeks he has been having worsening right leg pain.  He denies any trauma to his person.  He denies chest pain, shortness of breath, fever, chills, nausea, vomiting, diarrhea.  Of note he was diagnosed with a left leg DVT in May 2023.  Patient completed the initial packet of Eliquis that was prescribed to him.  Upon going to the pharmacy for a refill, he was told by the pharmacy that the refill would be over $700.  They did not have the money and therefore they did not refill the medication.  Daughter was not aware of this in order to into other options.  At bedside we extensively discussed with patient and spouse with  interpreter service that he will need to be on lifelong anticoagulation for prevention of DVT.  I also discussed that if he does not take his anticoagulation, blood clots can go to his lungs and cause his heart to stop beating and he could die.   Social history: Lives at home with his wife.  He denies tobacco, EtOH, recreational drug use  ROS: Constitutional: no weight change, no fever ENT/Mouth: no sore throat, no rhinorrhea Eyes: no eye pain, no vision changes Cardiovascular: no chest pain, no dyspnea,  no edema, no palpitations Respiratory: no cough, no sputum, no wheezing Gastrointestinal: no nausea, no vomiting, no diarrhea, no constipation Genitourinary: no urinary incontinence, no dysuria, no hematuria Musculoskeletal: no arthralgias, + myalgias Skin: no skin lesions, no pruritus, Neuro: no weakness, no loss of consciousness, no syncope Psych: no anxiety, no depression, no decrease appetite Heme/Lymph: no bruising, no bleeding  ED Course: Discussed with emergency medicine provider, patient requiring hospitalization for chief concerns of new DVT.  Assessment/Plan  Principal Problem:   DVT (deep venous thrombosis) (HCC) Active Problems:   HTN (hypertension)   Dyslipidemia   DRESS syndrome   Unilateral vocal cord paralysis   Hyperlipidemia, mixed   History of malnutrition   Assessment and Plan:  * DVT (deep venous thrombosis) (HCC) Right lower leg DVT in the common femoral vein History of left popliteal to mid left femoral DVT in May 2023 (04/07/22 [place]) - Continue bivalirudin gtt. - CTA of the chest to assess for PE ordered - Admit to telemetry cardiac, inpatient  HTN (hypertension) -  Hydralazine 10 g p.o. every 6 hours as needed for SBP greater than 180, 4 days ordered  Dyslipidemia - Atorvastatin nightly ordered  Hyperlipidemia, mixed - Atorvastatin 20 mg nightly resumed - Of note patient is not taking this medication per med reconciliation  DRESS syndrome -  Per documentation, dress syndrome to beta-lactam  Chart reviewed.   DVT prophylaxis: bivalirudin GTT Code Status: Full code Diet: Heart healthy; n.p.o. 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   Hyperlipidemia    Hypertension    Past Surgical History:  Procedure Laterality Date   BACK SURGERY     COLONOSCOPY WITH PROPOFOL N/A 11/05/2019   Procedure: COLONOSCOPY WITH PROPOFOL;  Surgeon: [name], MD;  Location: [facility] ENDOSCOPY;  Service: Gastroenterology;  Laterality: N/A;   PARTIAL COLECTOMY N/A 02/11/2020   Procedure: PARTIAL COLECTOMY -- sigmoid;  Surgeon: Olean Ree, MD;  Location: [facility] ORS;  Service: General;  Laterality: N/A;   SPINE SURGERY     TAKE DOWN OF INTESTINAL FISTULA N/A 02/11/2020   Procedure: TAKE DOWN OF INTESTINAL FISTULA -- colovesical fistula;  Surgeon: Olean Ree, MD;  Location: [facility] ORS;  Service: General;  Laterality: N/A;   TRACHEAL SURGERY     Social History:  reports that he has never smoked. He has never used smokeless tobacco. He reports that he does not drink alcohol and does not use drugs.  Allergies  Allergen Reactions   Penicillins Rash    Beta-lactam allergy, DRESS Beta-lactam allergy, DRESS    Shrimp [Shellfish Allergy] Hives   Cisatracurium Other (See Comments)    Other reaction(s): Unknown Other reaction(s): Unknown    Heparin Hives and Swelling    Patient's daughter reports patient had swelling/hives during hospital admission in [place]. They were on several medications at once so she is unsure whether it was VANCOMYCIN that caused this reaction or another concomitant medication.    Rocuronium Other (See Comments)    Other reaction(s): Unknown Other reaction(s): Unknown     Vancomycin Hives and Swelling    Patient's daughter reports patient had swelling/hives during hospital admission in [place]. They were on several medications at once so she is unsure whether it was VANCOMYCIN that caused this reaction or another concomitant medication.    Family History  Problem Relation Age of Onset   Prostate cancer Neg Hx    Bladder Cancer Neg Hx    Kidney cancer Neg Hx    Family history: Family history reviewed and not pertinent.  Prior to Admission medications   Medication Sig Start Date End Date Taking? Authorizing Provider  APIXABAN (ELIQUIS) VTE STARTER PACK ('10MG'$  AND '5MG'$ ) Take as directed on package: start with two-'5mg'$  tablets twice daily for 7 days. On day 8, switch to one-'5mg'$  tablet twice daily. 04/09/22   Annita Brod, MD  doxycycline (VIBRAMYCIN) 100 MG capsule Take 100 mg by mouth daily.    [provider]  gabapentin (NEURONTIN) 300 MG capsule Take 300 mg by mouth 3 (three) times daily.    [provider]  HYDROcodone-acetaminophen (NORCO/VICODIN) 5-325 MG tablet Take 1 tablet by mouth every 6 (six) hours as needed for moderate pain. 09/22/22 09/22/23  Letitia Neri L, PA-C  ondansetron (ZOFRAN-ODT) 4 MG disintegrating tablet Take 1 tablet (4 mg total) by mouth every 8 (eight) hours as  needed for nausea or vomiting. 06/11/22   Duffy Bruce, MD  predniSONE (DELTASONE) 10 MG tablet Take 6 tablets  today, on day 2 take 5 tablets, day 3 take 4 tablets, day 4 take 3 tablets, day 5 take  2 tablets and 1 tablet the last day 09/22/22   Johnn Hai, PA-C   Physical Exam: Vitals:   10/06/22 1652 10/06/22 1700 10/06/22 1715 10/06/22 2019  BP:  97/74  101/67  Pulse: (!) 103 (!) 101 97 83  Resp: (!) 24 (!) 28 (!) 24 15  Temp:    97.8 F (36.6 C)  TempSrc:    Oral  SpO2: 94% 94% 94% 95%  Weight:      Height:       Constitutional: appears age-appropriate, NAD, calm, comfortable Eyes: PERRL, lids and conjunctivae normal ENMT: Mucous  membranes are moist. Posterior pharynx clear of any exudate or lesions. Age-appropriate dentition. Hearing appropriate Neck: normal, supple, no masses, no thyromegaly Respiratory: clear to auscultation bilaterally, no wheezing, no crackles. Normal respiratory effort. No accessory muscle use.  Cardiovascular: Regular rate and rhythm, no murmurs / rubs / gallops. No extremity edema. 2+ pedal pulses. No carotid bruits.  Abdomen: no tenderness, no masses palpated, no hepatosplenomegaly. Bowel sounds positive.  Musculoskeletal: no clubbing / cyanosis. No joint deformity upper and lower extremities. Good ROM, no contractures, no atrophy. Normal muscle tone.  Right leg pain. Skin: no rashes, lesions, ulcers. No induration Neurologic: Sensation intact. Strength 5/5 in all 4.  Psychiatric: Normal judgment and insight. Alert and oriented x 3. Normal mood.   EKG: independently reviewed, showing sinus rhythm with rate of 100, QTc 412  Chest x-ray on Admission: I personally reviewed and I agree with radiologist reading as below.  CT Angio Chest Pulmonary Embolism (PE) W or WO Contrast  Result Date: 10/06/2022 CLINICAL DATA:  Tachycardia, history of DVT in right lower extremity EXAM: CT ANGIOGRAPHY CHEST WITH CONTRAST TECHNIQUE: Multidetector CT imaging of the chest was performed using the standard protocol during bolus administration of intravenous contrast. Multiplanar CT image reconstructions and MIPs were obtained to evaluate the vascular anatomy. RADIATION DOSE REDUCTION: This exam was performed according to the departmental dose-optimization program which includes automated exposure control, adjustment of the mA and/or kV according to patient size and/or use of iterative reconstruction technique. CONTRAST:  83m OMNIPAQUE IOHEXOL 350 MG/ML SOLN COMPARISON:  CT chest done on 06/11/2022 FINDINGS: Cardiovascular: There is homogeneous enhancement in thoracic aorta. Calcifications are seen in mitral annulus.  There are no intraluminal filling defects in central pulmonary artery branches. Evaluation of small peripheral branches is limited by motion artifacts, especially in the lower lung fields. Mediastinum/Nodes: No significant lymphadenopathy is seen. Lungs/Pleura: Increased interstitial markings are seen in the periphery of both lungs, more so in the lower lung fields. There is no focal consolidation. There is slight improvement in aeration of lower lung fields. There is no pleural effusion or pneumothorax. Upper Abdomen: There is fatty infiltration in liver. There is subtle increased density in the dependent portion of gallbladder. There is no wall thickening in gallbladder. Small hiatal hernia is seen. Musculoskeletal: There is laminectomy and surgical fusion in the thoracic spine there is a metallic structure embedded in the bodies of T3, T4 and T5 vertebrae. Review of the MIP images confirms the above findings. IMPRESSION: There is no evidence of central pulmonary artery embolism. There is no evidence of thoracic aortic dissection. Increased interstitial markings are seen in both lungs, more so in the lower  lung fields. 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 Possibility of superimposed interstitial pneumonia is not excluded. There is no pleural effusion or pneumothorax. Small hiatal hernia. There is subtle increased density in the dependent portion of gallbladder suggesting tiny gallbladder stones or sludge. Other findings as described in the body of the report. Electronically Signed   By: Elmer Picker M.D.   On: 10/06/2022 16:13   US Venous Img Lower Unilateral Right (DVT)  Result Date: 10/06/2022 CLINICAL DATA:  Right lower extremity edema EXAM: RIGHT LOWER EXTREMITY VENOUS DOPPLER ULTRASOUND TECHNIQUE: Gray-scale sonography with graded compression, as well as color Doppler and duplex ultrasound were performed to evaluate the lower extremity deep venous systems from the  level of the common femoral vein and including the common femoral, femoral, profunda femoral, popliteal and calf veins including the posterior tibial, peroneal and gastrocnemius veins when visible. The superficial great saphenous vein was also interrogated. Spectral Doppler was utilized to evaluate flow at rest and with distal augmentation maneuvers in the common femoral, femoral and popliteal veins. COMPARISON:  None Available. FINDINGS: Contralateral Common Femoral Vein: Respiratory phasicity is normal and symmetric with the symptomatic side. No evidence of thrombus. Normal compressibility. Common Femoral Vein: The common femoral vein is not compressible. The vein is expanded and filled with low-level internal echoes. Findings are consistent with acute occlusive DVT. No evidence of color flow on color Doppler imaging. Saphenofemoral Junction: Occlusive DVT extends into the saphenofemoral junction. The remainder of the great saphenous vein is patent. Profunda Femoral Vein: Occlusive thrombus extends into the profunda femoral vein. Femoral Vein: Occlusive thrombus extends into the femoral vein throughout the thigh. Popliteal Vein: Occlusive thrombus extends into the popliteal vein. Calf Veins: Occlusive thrombus extends into the posterior tibial veins. Superficial Great Saphenous Vein: No evidence of thrombus. Normal compressibility. Venous Reflux:  None. Other Findings:  None. IMPRESSION: Positive for extensive acute occlusive DVT throughout the right lower extremity from the common femoral vein into the calf. These results will be called to the ordering clinician or representative by the Radiologist Assistant, and communication documented in the PACS or [employer]. Electronically Signed   By: Jacqulynn Cadet M.D.   On: 10/06/2022 11:52    Labs on Admission: I have personally reviewed following labs  CBC: Recent Labs  Lab 10/06/22 1416  WBC 9.7  HGB 12.7*  HCT 39.0  MCV 91.5  PLT 034   Basic  Metabolic Panel: Recent Labs  Lab 10/06/22 1416  NA 137  K 4.5  CL 106  CO2 21*  GLUCOSE 124*  BUN 24*  CREATININE 1.29*  CALCIUM 8.8*   GFR: Estimated Creatinine Clearance: 60.3 mL/min (A) (by C-G formula based on SCr of 1.29 mg/dL (H)).  Liver Function Tests: Recent Labs  Lab 10/06/22 1416  AST 18  ALT 21  ALKPHOS 69  BILITOT 1.3*  PROT 7.3  ALBUMIN 3.9   Urine analysis:    Component Value Date/Time   COLORURINE YELLOW (A) 09/22/2022 1154   APPEARANCEUR CLEAR (A) 09/22/2022 1154   APPEARANCEUR Hazy (A) 03/10/2020 1151   LABSPEC 1.006 09/22/2022 1154   PHURINE 5.0 09/22/2022 1154   GLUCOSEU NEGATIVE 09/22/2022 1154   HGBUR SMALL (A) 09/22/2022 1154   BILIRUBINUR NEGATIVE 09/22/2022 1154   BILIRUBINUR Negative 03/10/2020 1151   KETONESUR NEGATIVE 09/22/2022 1154   PROTEINUR NEGATIVE 09/22/2022 1154   NITRITE NEGATIVE 09/22/2022 1154   LEUKOCYTESUR NEGATIVE 09/22/2022 1154   Dr. Tobie Poet [facility]  If 7PM-7AM, please contact overnight-coverage  provider If 7AM-7PM, please contact day coverage provider [email or web address]  10/06/2022, 8:22 PM

## 2022-10-06 NOTE — Hospital Course (Signed)
[name] is a 71 year old male with history of DVT in May 2020, hyperlipidemia, who presents emergency department for chief concerns of right leg pain with DVT on ultrasound outpatient.  Initial vitals in the emergency department showed temperature 99.5 with elevation to 102.9, respiration rate of 18, heart rate of 96, blood pressure 124/72, SPO2 of 94% on room air.  Serum sodium is 142, potassium 4.1, chloride 112, bicarb 23, BUN of 17, serum creatinine of 0.92, nonfasting glucose of 100, GFR greater than 60, WBC 7.2, hemoglobin 12.7, platelets of 162.  ED treatment: Tylenol 1 g, ibuprofen 600 mg p.o.  Patient was started on bivalirudin gtt., cefepime, linezolid, sodium chloride 1 L bolus.  EDP consulted vascular who states they will see the patient with possible thrombectomy on 10/07/2022.

## 2022-10-06 NOTE — ED Notes (Signed)
MD Cox at the beside. 2nd set of [facility] obtained by lab

## 2022-10-06 NOTE — Assessment & Plan Note (Signed)
-   Atorvastatin nightly ordered

## 2022-10-06 NOTE — Consult Note (Signed)
ANTICOAGULATION CONSULT NOTE - Initial Consult  Pharmacy Consult for bivalirudin infusion Indication: DVT  Allergies  Allergen Reactions   Penicillins Rash    Beta-lactam allergy, DRESS Beta-lactam allergy, DRESS    Shrimp [Shellfish Allergy] Hives   Cisatracurium Other (See Comments)    Other reaction(s): Unknown Other reaction(s): Unknown    Heparin Hives and Swelling    Patient's daughter reports patient had swelling/hives during hospital admission in [place]. They were on several medications at once so she is unsure whether it was VANCOMYCIN that caused this reaction or another concomitant medication.    Rocuronium Other (See Comments)    Other reaction(s): Unknown Other reaction(s): Unknown    Vancomycin Hives and Swelling    Patient's daughter reports patient had swelling/hives during hospital admission in [place]. They were on several medications at once so she is unsure whether it was VANCOMYCIN that caused this reaction or another concomitant medication.     Patient Measurements: Height: '5\' 10"'$  (177.8 cm) Weight: 96.6 kg (213 lb) IBW/kg (Calculated) : 73 Heparin Dosing Weight: 93.9kg  Vital Signs: Temp: 97.8 F (36.6 C) (11/16 2019) Temp Source: Oral (11/16 2019) BP: 101/67 (11/16 2019) Pulse Rate: 83 (11/16 2019)  Labs: Recent Labs    10/06/22 1416 10/06/22 1929  HGB 12.7*  --   HCT 39.0  --   PLT 162  --   APTT 30 29  LABPROT 15.2  --   INR 1.2  --   CREATININE 1.29*  --     Estimated Creatinine Clearance: 60.3 mL/min (A) (by C-G formula based on SCr of 1.29 mg/dL (H)).   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 They were on several medications at once so she is unsure whether it was  heparin that caused this reaction or another concomitant medication.   Assessment: 71yo male with PMH hypertension, hyperlipidemia, arthritis and DVT (03/2022) was started on Eliquis but stopped taking due to cost now presents to ED with  complaint of right lower extremity pain and swelling that has been occurring over past 4 days. Daughter reports possible heparin allergy resulting in hives, discussed alternative agents with ED, including bivalirudin. Of note, patient has tolerated multiple doses of enoxaparin (LMWH) in the past. Given possibility of vascular intervention, MD provider would like to start bivalirudin. Pharmacy consulted for management of bivalirudin infusion in the setting of DVT.   11/16 19:29  aPTT 29 seconds  Goal of Therapy:  aPTT 50-85 seconds Monitor platelets by anticoagulation protocol: Yes   Plan:  11/16 19:59 aPTT 29 seconds is subtherapeutic, will increase bivalirudin infusion by 30% (0.195 mg/kg/hr) Check aPTT 4 hours after initiation or any dose changes until stable and then a minimum of daily while receiving bivalirudin Check CBC daily with AM labs.   Paulina Fusi, PharmD, BCPS 10/06/2022 8:30 PM

## 2022-10-06 NOTE — ED Notes (Signed)
IV team at bedside 

## 2022-10-06 NOTE — ED Notes (Signed)
Hospitalist in with patient.

## 2022-10-06 NOTE — Assessment & Plan Note (Signed)
-   Atorvastatin 20 mg nightly resumed - Of note patient is not taking this medication per med reconciliation

## 2022-10-06 NOTE — ED Provider Notes (Signed)
[facility] Provider Note    Event Date/Time   First MD Initiated Contact with Patient 10/06/22 1241     (approximate)   History   Leg Pain   HPI  Arnel Wymer is a 71 y.o. male   Past medical history of hypertension, hyperlipidemia, arthritis and DVT that was diagnosed in the springtime of this year started on Eliquis but patient discontinued due to lack of understanding that he should be on it for a prolonged course, who presents with 4 days of right lower extremity pain and swelling consistent with DVT in the past.  Denies trauma, fever.  He has no chest pain or trouble breathing.  He is usually ambulatory and very active but pain has limited his ambulation.  History was obtained via the patient. Independent historians include his wife and daughter who are at bedside to offer collateral information and especially about the misunderstanding about his medications.      Physical Exam   Triage Vital Signs: ED Triage Vitals  Enc Vitals Group     BP 10/06/22 1222 124/72     Pulse Rate 10/06/22 1222 96     Resp 10/06/22 1222 18     Temp 10/06/22 1222 99.5 F (37.5 C)     Temp Source 10/06/22 1222 Oral     SpO2 10/06/22 1222 94 %     Weight 10/06/22 1223 207 lb 0.2 oz (93.9 kg)     Height 10/06/22 1223 '5\' 10"'$  (1.778 m)     Head Circumference --      Peak Flow --      Pain Score 10/06/22 1223 9     Pain Loc --      Pain Edu? --      Excl. in [place]? --     Most recent vital signs: Vitals:   10/06/22 1222 10/06/22 1400  BP: 124/72 110/78  Pulse: 96 (!) 105  Resp: 18 (!) 34  Temp: 99.5 F (37.5 C) (!) 102.9 F (39.4 C)  SpO2: 94% 96%    General: Awake, no distress.  CV:  Good peripheral perfusion.  Resp:  Normal effort.  Abd:  No distention.  Other:  Alert oriented and pleasant.  He has profound swelling to the right lower extremity from thigh to ankle.  Neurovascular intact.  Tender to palpation.   ED Results / Procedures / Treatments    Labs (all labs ordered are listed, but only abnormal results are displayed) Labs Reviewed  CBC - Abnormal; Notable for the following components:      Result Value   Hemoglobin 12.7 (*)    All other components within normal limits  CULTURE, BLOOD (ROUTINE X 2)  CULTURE, BLOOD (ROUTINE X 2)  COMPREHENSIVE METABOLIC PANEL  APTT  PROTIME-INR  LACTIC ACID, PLASMA  LACTIC ACID, PLASMA     I reviewed labs and they are notable for Hgb 12.7  EKG  ED ECG REPORT I, Lucillie Garfinkel, the attending physician, personally viewed and interpreted this ECG.   Date: 10/06/2022  EKG Time: 1227  Rate: 100  Rhythm: sinus tachycardia  Axis: nl  Intervals:none  ST&T Change: No acute ischemic changes.,  T wave inversion isolated in the lead III.    RADIOLOGY I independently reviewed and interpreted ultrasound of the right lower extremity and see findings suggestive of DVT.   PROCEDURES:  Critical Care performed: No  Procedures   MEDICATIONS ORDERED IN ED: Medications  bivalirudin (ANGIOMAX) 250 mg in sodium chloride 0.9 %  500 mL (0.5 mg/mL) infusion (has no administration in time range)  ceFEPIme (MAXIPIME) 2 g in sodium chloride 0.9 % 100 mL IVPB (2 g Intravenous New Bag/Given 10/06/22 1426)  acetaminophen (TYLENOL) tablet 1,000 mg (has no administration in time range)  ibuprofen (ADVIL) tablet 600 mg (has no administration in time range)  sodium chloride 0.9 % bolus 1,000 mL (1,000 mLs Intravenous New Bag/Given 10/06/22 1431)    Consultants:  I spoke with vascular surgeon Dr. Lucky Cowboy who recommended anticoagulation, heparinization, and will likely thrombectomy while patient is inpatient with hospitalist service.  IMPRESSION / MDM / ASSESSMENT AND PLAN / ED COURSE  I reviewed the triage vital signs and the nursing notes.                              Differential diagnosis includes, but is not limited to, DVT, PE     MDM: Patient with extensive DVT on the right lower extremity  diagnosed on outpatient imaging.  She has no chest pain or dyspnea to suggest PE at this time.  Hemodynamics are appropriate and reassuring.  He has extensive involvement of the right lower extremity multiple veins and has affected to the point of unable to ambulate.  We will start heparin infusion for VTE and admit to hospitalist service.   While being worked up patient developed a fever of 102.  No other focal infectious symptoms on review with this patient, suspect both phlebitis related to his DVT and leg swelling, broad-spectrum antibiotics ordered at this time.  Vascular surgery was consulted as well, agree with anticoagulation, antibiotics and admission to hospitalist service they will follow for probable thrombectomy.  Patient's presentation is most consistent with acute presentation with potential threat to life or bodily function.       FINAL CLINICAL IMPRESSION(S) / ED DIAGNOSES   Final diagnoses:  DVT, recurrent, lower extremity, acute, right (HCC)  Right leg pain  Fever, unspecified fever cause     Rx / DC Orders   ED Discharge Orders     None        Note:  This document was prepared using Dragon voice recognition software and may include unintentional dictation errors.    Lucillie Garfinkel, MD 10/06/22 1440

## 2022-10-06 NOTE — Assessment & Plan Note (Addendum)
-   Hydralazine 10 g p.o. every 6 hours as needed for SBP greater than 180, 4 days ordered

## 2022-10-06 NOTE — Assessment & Plan Note (Addendum)
Right lower leg DVT in the common femoral vein History of left popliteal to mid left femoral DVT in May 2023 (04/07/22 [place]) - Continue bivalirudin gtt. - CTA of the chest to assess for PE ordered - Admit to telemetry cardiac, inpatient

## 2022-10-06 NOTE — Assessment & Plan Note (Signed)
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

## 2022-10-06 NOTE — Consult Note (Signed)
ANTICOAGULATION CONSULT NOTE - Initial Consult  Pharmacy Consult for bivalirudin infusion Indication: DVT  Allergies  Allergen Reactions   Penicillins Rash    Beta-lactam allergy, DRESS Beta-lactam allergy, DRESS    Shrimp [Shellfish Allergy] Hives   Cisatracurium Other (See Comments)    Other reaction(s): Unknown Other reaction(s): Unknown    Heparin Other (See Comments)    Other reaction(s): Unknown Other reaction(s): Unknown    Rocuronium Other (See Comments)    Other reaction(s): Unknown Other reaction(s): Unknown    Vancomycin Other (See Comments)    Other reaction(s): Unknown Other reaction(s): Unknown     Patient Measurements: Height: '5\' 10"'$  (177.8 cm) Weight: 93.9 kg (207 lb 0.2 oz) IBW/kg (Calculated) : 73 Heparin Dosing Weight: 93.9kg  Vital Signs: Temp: 99.5 F (37.5 C) (11/16 1222) Temp Source: Oral (11/16 1222) BP: 124/72 (11/16 1222) Pulse Rate: 96 (11/16 1222)  Labs: No results for input(s): "HGB", "HCT", "PLT", "APTT", "LABPROT", "INR", "HEPARINUNFRC", "HEPRLOWMOCWT", "CREATININE", "CKTOTAL", "CKMB", "TROPONINIHS" in the last 72 hours.  Estimated Creatinine Clearance: 83.6 mL/min (by C-G formula based on SCr of 0.92 mg/dL).   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 They were on several medications at once so she is unsure whether it was  heparin that caused this reaction or another concomitant medication.   Assessment: 71yo male with PMH hypertension, hyperlipidemia, arthritis and DVT (03/2022) was started on Eliquis but stopped taking due to cost now presents to ED with  complaint of right lower  extremity pain and swelling that has been occurring over past 4 days. Daughter reports possible heparin allergy resulting in hives, discussed alternative agents with ED, including bivalirudin. Of note, patient has tolerated multiple doses of enoxaparin (LMWH) in the past. Given possibility of vascular intervention, MD provider would like to start bivalirudin. Pharmacy consulted for management of bivalirudin infusion in the setting of DVT.   Goal of Therapy:  aPTT 50-85 seconds Monitor platelets by anticoagulation protocol: Yes   Plan:  Initiate bivalirudin infusion at 14 mg/hr (0.15 mg/kg/hr) Check aPTT 4 hours after initiation or any dose changes until stable and then a minimum of daily while receiving bivalirudin Check CBC daily with AM labs.   Darrick Penna 10/06/2022,1:25 PM

## 2022-10-06 NOTE — ED Notes (Signed)
Pt to ct 

## 2022-10-07 ENCOUNTER — Encounter
Admission: EM | Disposition: A | Discharge status: Home / Self Care | Payer: Self-pay | Source: Home / Self Care | Attending: Internal Medicine

## 2022-10-07 ENCOUNTER — Other Ambulatory Visit (HOSPITAL_COMMUNITY): Payer: Self-pay

## 2022-10-07 DIAGNOSIS — Z95828 Presence of other vascular implants and grafts: Secondary | ICD-10-CM

## 2022-10-07 DIAGNOSIS — I82401 Acute embolism and thrombosis of unspecified deep veins of right lower extremity: Secondary | ICD-10-CM | POA: Diagnosis not present

## 2022-10-07 DIAGNOSIS — I82421 Acute embolism and thrombosis of right iliac vein: Secondary | ICD-10-CM

## 2022-10-07 DIAGNOSIS — I82220 Acute embolism and thrombosis of inferior vena cava: Secondary | ICD-10-CM

## 2022-10-07 HISTORY — PX: PERIPHERAL VASCULAR THROMBECTOMY: CATH118306

## 2022-10-07 LAB — CBC
HCT: 36.8 % — ABNORMAL LOW (ref 39.0–52.0)
Hemoglobin: 12 g/dL — ABNORMAL LOW (ref 13.0–17.0)
MCH: 29.6 pg (ref 26.0–34.0)
MCHC: 32.6 g/dL (ref 30.0–36.0)
MCV: 90.9 fL (ref 80.0–100.0)
Platelets: 154 10*3/uL (ref 150–400)
RBC: 4.05 MIL/uL — ABNORMAL LOW (ref 4.22–5.81)
RDW: 14.1 % (ref 11.5–15.5)
WBC: 7.6 10*3/uL (ref 4.0–10.5)
nRBC: 0 % (ref 0.0–0.2)

## 2022-10-07 LAB — BASIC METABOLIC PANEL
Anion gap: 8 (ref 5–15)
BUN: 21 mg/dL (ref 8–23)
CO2: 20 mmol/L — ABNORMAL LOW (ref 22–32)
Calcium: 8 mg/dL — ABNORMAL LOW (ref 8.9–10.3)
Chloride: 110 mmol/L (ref 98–111)
Creatinine, Ser: 1.02 mg/dL (ref 0.61–1.24)
GFR, Estimated: >60 mL/min (ref >60)
Glucose, Bld: 110 mg/dL — ABNORMAL HIGH (ref 70–99)
Potassium: 3.8 mmol/L (ref 3.5–5.1)
Sodium: 138 mmol/L (ref 135–145)

## 2022-10-07 LAB — APTT
aPTT: 66 seconds — ABNORMAL HIGH (ref 24–36)
aPTT: 75 seconds — ABNORMAL HIGH (ref 24–36)

## 2022-10-07 SURGERY — PERIPHERAL VASCULAR THROMBECTOMY
Anesthesia: Moderate Sedation | Laterality: Right

## 2022-10-07 MED ORDER — CIPROFLOXACIN IN D5W 400 MG/200ML IV SOLN: 400.0000 mg | INTRAVENOUS | Status: AC

## 2022-10-07 MED ORDER — SODIUM CHLORIDE FLUSH 0.9 % IV SOLN
INTRAVENOUS | Status: AC
  Filled 2022-10-07: qty 20

## 2022-10-07 MED ORDER — ALTEPLASE 2 MG IJ SOLR
INTRAMUSCULAR | Status: AC
  Filled 2022-10-07: qty 8

## 2022-10-07 MED ORDER — METHYLPREDNISOLONE SODIUM SUCC 125 MG IJ SOLR
INTRAMUSCULAR | Status: AC
  Administered 2022-10-07: 125 mg via INTRAVENOUS
  Filled 2022-10-07: qty 2

## 2022-10-07 MED ORDER — MIDAZOLAM HCL 2 MG/2ML IJ SOLN
INTRAMUSCULAR | Status: AC
  Filled 2022-10-07: qty 2

## 2022-10-07 MED ORDER — METHYLPREDNISOLONE SODIUM SUCC 125 MG IJ SOLR: 125.0000 mg | Freq: Once | INTRAMUSCULAR | Status: AC | PRN

## 2022-10-07 MED ORDER — FENTANYL CITRATE PF 50 MCG/ML IJ SOSY
PREFILLED_SYRINGE | INTRAMUSCULAR | Status: AC
  Filled 2022-10-07: qty 1

## 2022-10-07 MED ORDER — FAMOTIDINE 20 MG PO TABS: 40.0000 mg | ORAL_TABLET | Freq: Once | ORAL | Status: AC | PRN

## 2022-10-07 MED ORDER — LACTATED RINGERS IV BOLUS
500.0000 mL | Freq: Once | INTRAVENOUS | Status: AC
  Administered 2022-10-07: 500 mL via INTRAVENOUS

## 2022-10-07 MED ORDER — APIXABAN 5 MG PO TABS: 5.0000 mg | ORAL_TABLET | Freq: Two times a day (BID) | ORAL | Status: DC

## 2022-10-07 MED ORDER — MIDAZOLAM HCL 2 MG/2ML IJ SOLN
INTRAMUSCULAR | Status: DC | PRN
  Administered 2022-10-07 (×2): 2 mg via INTRAVENOUS

## 2022-10-07 MED ORDER — DIPHENHYDRAMINE HCL 50 MG/ML IJ SOLN
INTRAMUSCULAR | Status: AC
  Administered 2022-10-07: 50 mg via INTRAVENOUS
  Filled 2022-10-07: qty 1

## 2022-10-07 MED ORDER — FENTANYL CITRATE (PF) 100 MCG/2ML IJ SOLN
INTRAMUSCULAR | Status: DC | PRN
  Administered 2022-10-07 (×2): 50 ug via INTRAVENOUS

## 2022-10-07 MED ORDER — IODIXANOL 320 MG/ML IV SOLN
INTRAVENOUS | Status: DC | PRN
  Administered 2022-10-07: 45 mL via INTRAVENOUS

## 2022-10-07 MED ORDER — APIXABAN 5 MG PO TABS
10.0000 mg | ORAL_TABLET | Freq: Two times a day (BID) | ORAL | Status: DC
  Administered 2022-10-08 – 2022-10-09 (×3): 10 mg via ORAL
  Filled 2022-10-07 (×3): qty 2

## 2022-10-07 MED ORDER — FAMOTIDINE 20 MG PO TABS
ORAL_TABLET | ORAL | Status: AC
  Administered 2022-10-07: 40 mg via ORAL
  Filled 2022-10-07: qty 2

## 2022-10-07 MED ORDER — CIPROFLOXACIN IN D5W 400 MG/200ML IV SOLN
INTRAVENOUS | Status: AC
  Administered 2022-10-07: 400 mg via INTRAVENOUS
  Filled 2022-10-07: qty 200

## 2022-10-07 MED ORDER — CIPROFLOXACIN IN D5W 400 MG/200ML IV SOLN
400.0000 mg | INTRAVENOUS | Status: DC
  Filled 2022-10-07: qty 200

## 2022-10-07 MED ORDER — SODIUM CHLORIDE 0.9 % IV SOLN: INTRAVENOUS | Status: DC

## 2022-10-07 MED ORDER — GABAPENTIN 300 MG PO CAPS
300.0000 mg | ORAL_CAPSULE | Freq: Three times a day (TID) | ORAL | Status: DC
  Administered 2022-10-07 – 2022-10-09 (×6): 300 mg via ORAL
  Filled 2022-10-07 (×6): qty 1

## 2022-10-07 MED ORDER — ALTEPLASE 2 MG IJ SOLR
INTRAMUSCULAR | Status: DC | PRN
  Administered 2022-10-07: 8 mg

## 2022-10-07 MED ORDER — LACTATED RINGERS IV SOLN: INTRAVENOUS | Status: DC

## 2022-10-07 MED ORDER — DIPHENHYDRAMINE HCL 50 MG/ML IJ SOLN: 50.0000 mg | Freq: Once | INTRAMUSCULAR | Status: AC | PRN

## 2022-10-07 SURGICAL SUPPLY — 14 items
CANISTER PENUMBRA ENGINE (MISCELLANEOUS) IMPLANT
CANNULA 5F STIFF (CANNULA) IMPLANT
CATH LIGHTNI FLASH 16XTORQ 100 (CATHETERS) IMPLANT
CATH LIGHTNING FLASH XTORQ 100 (CATHETERS) ×1
CLOSURE PERCLOSE PROSTYLE (VASCULAR PRODUCTS) IMPLANT
COVER PROBE ULTRASOUND 5X96 (MISCELLANEOUS) IMPLANT
DRYSEAL FLEXSHEATH 16FR 33CM (SHEATH) ×1
PACK ANGIOGRAPHY (CUSTOM PROCEDURE TRAY) ×1 IMPLANT
SHEATH BRITE TIP 6FRX11 (SHEATH) IMPLANT
SHEATH DRYSEAL FLEX 16FR 33CM (SHEATH) IMPLANT
SUT MNCRL AB 4-0 PS2 18 (SUTURE) IMPLANT
SUT PROLENE 0 CT 1 30 (SUTURE) IMPLANT
WIRE GUIDERIGHT .035X150 (WIRE) IMPLANT
WIRE NITINOL .018 (WIRE) IMPLANT

## 2022-10-07 NOTE — Op Note (Signed)
[facility]   OPERATIVE NOTE   PRE-OPERATIVE DIAGNOSIS: extensive right lower extremity DVT  POST-OPERATIVE DIAGNOSIS: same   PROCEDURE: 1.   US guidance for vascular access to right popliteal vein 2.   Catheter placement into right common iliac vein from right popliteal approach 3.   IVC gram and right lower extremity venogram 4.   Catheter directed thrombolysis with 8 mg of tPA  5.   Mechanical thrombectomy to right superficial femoral vein, common femoral vein, external iliac vein, common iliac vein, and inferior vena cava with the penumbra 16 device    SURGEON: [name], MD  ASSISTANT(S): none  ANESTHESIA: local with moderate conscious sedation for 30 minutes using 2 mg of Versed and 50 mcg of Fentanyl  ESTIMATED BLOOD LOSS: 550 cc  FINDING(S): 1.  Extensive thrombosis throughout the right lower extremity as well as thrombosis of the right external and common iliac veins and thrombosis of the IVC up to a previously placed IVC filter  SPECIMEN(S):  none  INDICATIONS:    Patient is a 71 y.o. male who presents with marked leg pain and swelling in the right lower extremity with the study showing extensive right lower extremity DVT.  Patient has marked leg swelling and pain.  Venous intervention is performed to reduce the symtpoms and avoid long term postphlebitic symptoms.    DESCRIPTION: After obtaining full informed written consent, the patient was brought back to the vascular suite and placed supine upon the table. Moderate conscious sedation was administered during a face to face encounter with the patient throughout the procedure with my supervision of the RN administering medicines and monitoring the patient's vital signs, pulse oximetry, telemetry and mental status throughout from the start of the procedure until the patient was taken to the recovery room.  After obtaining adequate anesthesia, the patient was prepped and draped in the standard  fashion.    The patient was then placed into the prone position.  The right popliteal vein was then accessed under direct ultrasound guidance without difficulty with a micropuncture needle and a permanent image was recorded.  Imaging through the micropuncture sheath showed this to be in the venous location with extensive thrombosis.  A ProGlide device was then placed over the J-wire.  I then upsized to an 16Fr sheath over a J wire.   Imaging showed extensive DVT with minimal flow.  A Kumpe catheter and J wire were then advanced into the CFV and then up into the iliac veins and images were performed.  There was extensive thrombosis in the common femoral vein, external and common iliac veins, and all the way up into the IVC there was thrombus up to a previously placed IVC filter.  I then used the 16 [place] sheath and instilled 8 mg of tpa throughout the superficial femoral and common femoral veins.  After this dwelled, I used the Penumbra Cat 16 catheter and evacuated about 550 cc of effluent with mechanical thrombectomy throughout the inferior vena cava up to the IVC filter, iliac veins, CFV, and superficial femoral vein.  This had marked improvement throughout the superficial femoral vein, common femoral vein, and iliac veins with only a small amount of residual thrombus remaining.  The IVC had debulk the thrombus but there was still significant thrombus burden remaining and this appeared to be quite chronic.  We had already reached over 500 cc of blood loss and I did not feel like any further attempts in the IVC would be prudent  given the amount of blood loss in this location.  We can come back at a later date after he has had resuscitation and his hemoglobin has normalized and consider further intervention on the IVC.  I then elected to terminate the procedure.  The sheath was removed and the Pro-glide device was secured.  Pressure was held and a Monocryl suture was placed at the access site and a dressing was  placed.  He was taken to the recovery room in stable condition having tolerated the procedure well.    COMPLICATIONS: None  CONDITION: Stable  [name] 10/07/2022 2:30 PM

## 2022-10-07 NOTE — TOC Benefit Eligibility Note (Signed)
Patient Advocate Encounter  Insurance verification completed.    The patient is currently admitted and upon discharge could be taking Eliquis 5 mg.  The current 30 day co-pay is $0.00.   The patient is insured through [employer] Medicare Part D     Clemmie Marxen, CPhT Pharmacy Patient Advocate Specialist [facility] Patient Advocate Team Direct Number: [phone]  Fax: [phone]       

## 2022-10-07 NOTE — Consult Note (Signed)
ANTICOAGULATION CONSULT NOTE - Initial Consult  Pharmacy Consult for bivalirudin infusion Indication: DVT  Allergies  Allergen Reactions   Penicillins Rash    Beta-lactam allergy, DRESS Beta-lactam allergy, DRESS    Shrimp [Shellfish Allergy] Hives   Cisatracurium Other (See Comments)    Other reaction(s): Unknown Other reaction(s): Unknown    Heparin Hives and Swelling    Patient's daughter reports patient had swelling/hives during hospital admission in [place]. They were on several medications at once so she is unsure whether it was VANCOMYCIN that caused this reaction or another concomitant medication.    Rocuronium Other (See Comments)    Other reaction(s): Unknown Other reaction(s): Unknown    Vancomycin Hives and Swelling    Patient's daughter reports patient had swelling/hives during hospital admission in [place]. They were on several medications at once so she is unsure whether it was VANCOMYCIN that caused this reaction or another concomitant medication.     Patient Measurements: Height: '5\' 10"'$  (177.8 cm) Weight: 96.6 kg (213 lb) IBW/kg (Calculated) : 73 Heparin Dosing Weight: 93.9kg  Vital Signs: Temp: 103 F (39.4 C) (11/17 0049) Temp Source: Oral (11/16 2055) BP: 96/60 (11/17 0049) Pulse Rate: 116 (11/17 0100)  Labs: Recent Labs    10/06/22 1416 10/06/22 1929 10/07/22 0055  HGB 12.7*  --   --   HCT 39.0  --   --   PLT 162  --   --   APTT 30 29 66*  LABPROT 15.2  --   --   INR 1.2  --   --   CREATININE 1.29*  --   --      Estimated Creatinine Clearance: 60.3 mL/min (A) (by C-G formula based on SCr of 1.29 mg/dL (H)).   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 They were on several medications at once so she is unsure whether it was  heparin that caused this reaction or another concomitant medication.   Assessment: 71yo male with PMH hypertension, hyperlipidemia, arthritis and DVT (03/2022) was started on Eliquis but stopped taking due to cost now presents to ED with  complaint of right lower extremity pain and swelling that has been occurring over past 4 days. Daughter reports possible heparin allergy resulting in hives, discussed alternative agents with ED, including bivalirudin. Of note, patient has tolerated multiple doses of enoxaparin (LMWH) in the past. Given possibility of vascular intervention, MD provider would like to start bivalirudin. Pharmacy consulted for management of bivalirudin infusion in the setting of DVT.   11/16 19:29  aPTT 29 seconds 11/17 00:55 aPTT 66 seconds  Goal of Therapy:  aPTT 50-85 seconds Monitor platelets by anticoagulation protocol: Yes   Plan:  Continue increase bivalirudin infusion at 0.195 mg/kg/hr Recheck aPTT 4 hours to confirm and then a minimum of daily while receiving bivalirudin Check CBC daily with AM labs.   Renda Rolls, PharmD, [facility] 10/07/2022 1:53 AM

## 2022-10-07 NOTE — Progress Notes (Signed)
       CROSS COVER NOTE  NAME: Russell [name] [name]: [id number] DOB : Sep 26, 1951    Time of Service   6759  HPI/Events of Note   Nurse reported fever 103 F, with soft pressures earlier (red MEW 5). Review of labs and vitals show increasing tachycardia and decreasing pressures as well as AKI with creatinine 1.29 noted baseline is normal 0.9.  Also notably feverish in ED at 102. No maintenance fluids currently ordered.. Total of 1500 fluid bolus given earlier afternoon yesterday in eD  Assessment and  Interventions   Assessment:  Plan: 500 ml LR bolus followed by 100 ml LR per hour       Kathlene Cote NP [facility]

## 2022-10-07 NOTE — Interval H&P Note (Signed)
History and Physical Interval Note:  10/07/2022 8:11 AM  Russell [name]  has presented today for surgery, with the diagnosis of Deep vein thrombosis.  The various methods of treatment have been discussed with the patient and family. After consideration of risks, benefits and other options for treatment, the patient has consented to  Procedure(s): PERIPHERAL VASCULAR THROMBECTOMY (Right) as a surgical intervention.  The patient's history has been reviewed, patient examined, no change in status, stable for surgery.  I have reviewed the patient's chart and labs.  Questions were answered to the patient's satisfaction.     [name]

## 2022-10-07 NOTE — Consult Note (Signed)
[facility] for Bivalirudin Infusion Indication: DVT  Allergies  Allergen Reactions   Penicillins Rash    Beta-lactam allergy, DRESS Beta-lactam allergy, DRESS    Shrimp [Shellfish Allergy] Hives   Cisatracurium Other (See Comments)    Other reaction(s): Unknown Other reaction(s): Unknown    Heparin Hives and Swelling    Patient's daughter reports patient had swelling/hives during hospital admission in [place]. They were on several medications at once so she is unsure whether it was VANCOMYCIN that caused this reaction or another concomitant medication.    Rocuronium Other (See Comments)    Other reaction(s): Unknown Other reaction(s): Unknown    Vancomycin Hives and Swelling    Patient's daughter reports patient had swelling/hives during hospital admission in [place]. They were on several medications at once so she is unsure whether it was VANCOMYCIN that caused this reaction or another concomitant medication.     Patient Measurements: Height: '5\' 10"'$  (177.8 cm) Weight: 96.6 kg (213 lb) IBW/kg (Calculated) : 73 Heparin Dosing Weight: 93.9kg  Vital Signs: Temp: 98.7 F (37.1 C) (11/17 0600) Temp Source: Oral (11/17 0600) BP: 106/68 (11/17 0600) Pulse Rate: 98 (11/17 0600)  Labs: Recent Labs    10/06/22 1416 10/06/22 1929 10/07/22 0055 10/07/22 0541  HGB 12.7*  --   --  12.0*  HCT 39.0  --   --  36.8*  PLT 162  --   --  154  APTT 30 29 66* 75*  LABPROT 15.2  --   --   --   INR 1.2  --   --   --   CREATININE 1.29*  --   --  1.02     Estimated Creatinine Clearance: 76.3 mL/min (by C-G formula based on SCr of 1.02 mg/dL).   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   Hyperlipidemia    Hypertension     Medications:  Scheduled:   atorvastatin  20 mg Oral QHS   Infusions:   bivalirudin (ANGIOMAX) 250 mg in sodium chloride 0.9 % 500 mL (0.5 mg/mL)  infusion 0.1949 mg/kg/hr (10/07/22 0515)   ciprofloxacin     lactated ringers 125 mL/hr at 10/07/22 0515   PRN: acetaminophen **OR** acetaminophen, diphenhydrAMINE, famotidine, hydrALAZINE, HYDROcodone-acetaminophen, methylPREDNISolone (SOLU-MEDROL) injection, ondansetron **OR** ondansetron (ZOFRAN) IV, senna-docusate  Allergies: Patient's daughter reports patient had swelling/hives during hospital admission in [place]. They were on several medications at once so she is unsure whether it was  heparin that caused this reaction or another concomitant medication.   Assessment: Jamerion Cabello is a 71 y.o. male presenting with RLE pain and swelling, found to have DVT. PMH significant for HTN, HLD, arthritis, recent DVT (03/2022 was on Eliquis but stopped 04/2022 due to cost). Patient was not actively on [facility] PTA per chart review. Daughter reports possible heparin allergy resulting in hives, discussed alternative agents with ED, including bivalirudin. Of note, patient has tolerated multiple doses of enoxaparin (LMWH) in the past. Given possibility of vascular intervention, MD provider would like to start bivalirudin. Pharmacy has been consulted to initiate and manage bivalrudin infusion.   Baseline Labs: aPTT 30, Hgb 12.7, Hct 39.0, Plt 162   Goal of Therapy:  aPTT 50-85 seconds Monitor platelets by anticoagulation protocol: Yes   Date Time aPTT Rate (mg/h)/Comment  11/16 1929 29 14.1/SUBtherapeutic 11/17 0055 66 18.3/therapeutic x1 11/17 0541 75 18.3/therapeutic x2  Plan:  Continue bivalirudin infusion at  18.3 mg/h (0.195 mg/kg/h) Check aPTT at least daily while receiving bivalirudin Consider Q12H aPTT if patient in critical or unstable condition Check CBC daily and BMP at least weekly with AM labs while receiving bivalirudin  Gretel Acre, PharmD PGY1 Pharmacy Resident 10/07/2022 7:17 AM

## 2022-10-07 NOTE — Progress Notes (Signed)
[facility] NAME: Russell [name]    MR#:  [id number]  DATE OF BIRTH:  Aug 24, 1951  SUBJECTIVE:  client video [place] interpreter. No family at bedside. Patient is status post right lower extremity mechanical thrombectomy. Denies any complaints.   VITALS:  Blood pressure 111/75, pulse 80, temperature 99.2 F (37.3 C), temperature source Oral, resp. rate 19, height '5\' 10"'$  (1.778 m), weight 96.6 kg, SpO2 92 %.  PHYSICAL EXAMINATION:   GENERAL:  71 y.o.-year-old patient lying in the bed with no acute distress.  LUNGS: Normal breath sounds bilaterally, no wheezing CARDIOVASCULAR: S1, S2 normal. No murmurs,   ABDOMEN: Soft, nontender, nondistended. Bowel sounds present.  EXTREMITIES: right lower extremity ace wrap NEUROLOGIC: nonfocal  patient is alert and awake SKIN: No obvious rash, lesion, or ulcer.   LABORATORY PANEL:  CBC Recent Labs  Lab 10/07/22 0541  WBC 7.6  HGB 12.0*  HCT 36.8*  PLT 154    Chemistries  Recent Labs  Lab 10/06/22 1416 10/07/22 0541  NA 137 138  K 4.5 3.8  CL 106 110  CO2 21* 20*  GLUCOSE 124* 110*  BUN 24* 21  CREATININE 1.29* 1.02  CALCIUM 8.8* 8.0*  AST 18  --   ALT 21  --   ALKPHOS 69  --   BILITOT 1.3*  --    Cardiac Enzymes No results for input(s): "TROPONINI" in the last 168 hours. RADIOLOGY:  PERIPHERAL VASCULAR CATHETERIZATION  Result Date: 10/07/2022 See surgical note for result.  CT Angio Chest Pulmonary Embolism (PE) W or WO Contrast  Result Date: 10/06/2022 CLINICAL DATA:  Tachycardia, history of DVT in right lower extremity EXAM: CT ANGIOGRAPHY CHEST WITH CONTRAST TECHNIQUE: Multidetector CT imaging of the chest was performed using the standard protocol during bolus administration of intravenous contrast. Multiplanar CT image reconstructions and MIPs were obtained to evaluate the vascular anatomy. RADIATION DOSE REDUCTION: This exam was performed according to the  departmental dose-optimization program which includes automated exposure control, adjustment of the mA and/or kV according to patient size and/or use of iterative reconstruction technique. CONTRAST:  76m OMNIPAQUE IOHEXOL 350 MG/ML SOLN COMPARISON:  CT chest done on 06/11/2022 FINDINGS: Cardiovascular: There is homogeneous enhancement in thoracic aorta. Calcifications are seen in mitral annulus. There are no intraluminal filling defects in central pulmonary artery branches. Evaluation of small peripheral branches is limited by motion artifacts, especially in the lower lung fields. Mediastinum/Nodes: No significant lymphadenopathy is seen. Lungs/Pleura: Increased interstitial markings are seen in the periphery of both lungs, more so in the lower lung fields. There is no focal consolidation. There is slight improvement in aeration of lower lung fields. There is no pleural effusion or pneumothorax. Upper Abdomen: There is fatty infiltration in liver. There is subtle increased density in the dependent portion of gallbladder. There is no wall thickening in gallbladder. Small hiatal hernia is seen. Musculoskeletal: There is laminectomy and surgical fusion in the thoracic spine there is a metallic structure embedded in the bodies of T3, T4 and T5 vertebrae. Review of the MIP images confirms the above findings. IMPRESSION: There is no evidence of central pulmonary artery embolism. There is no evidence of thoracic aortic dissection. Increased interstitial markings are seen in both lungs, more so in the lower lung fields. 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 Possibility of superimposed interstitial pneumonia is not excluded. There is no pleural effusion or pneumothorax. Small hiatal hernia. There is subtle increased density in the  dependent portion of gallbladder suggesting tiny gallbladder stones or sludge. Other findings as described in the body of the report. Electronically Signed   By: Elmer Picker M.D.   On: 10/06/2022 16:13   US Venous Img Lower Unilateral Right (DVT)  Result Date: 10/06/2022 CLINICAL DATA:  Right lower extremity edema EXAM: RIGHT LOWER EXTREMITY VENOUS DOPPLER ULTRASOUND TECHNIQUE: Gray-scale sonography with graded compression, as well as color Doppler and duplex ultrasound were performed to evaluate the lower extremity deep venous systems from the level of the common femoral vein and including the common femoral, femoral, profunda femoral, popliteal and calf veins including the posterior tibial, peroneal and gastrocnemius veins when visible. The superficial great saphenous vein was also interrogated. Spectral Doppler was utilized to evaluate flow at rest and with distal augmentation maneuvers in the common femoral, femoral and popliteal veins. COMPARISON:  None Available. FINDINGS: Contralateral Common Femoral Vein: Respiratory phasicity is normal and symmetric with the symptomatic side. No evidence of thrombus. Normal compressibility. Common Femoral Vein: The common femoral vein is not compressible. The vein is expanded and filled with low-level internal echoes. Findings are consistent with acute occlusive DVT. No evidence of color flow on color Doppler imaging. Saphenofemoral Junction: Occlusive DVT extends into the saphenofemoral junction. The remainder of the great saphenous vein is patent. Profunda Femoral Vein: Occlusive thrombus extends into the profunda femoral vein. Femoral Vein: Occlusive thrombus extends into the femoral vein throughout the thigh. Popliteal Vein: Occlusive thrombus extends into the popliteal vein. Calf Veins: Occlusive thrombus extends into the posterior tibial veins. Superficial Great Saphenous Vein: No evidence of thrombus. Normal compressibility. Venous Reflux:  None. Other Findings:  None. IMPRESSION: Positive for extensive acute occlusive DVT throughout the right lower extremity from the common femoral vein into the calf. These results  will be called to the ordering clinician or representative by the Radiologist Assistant, and communication documented in the PACS or [employer]. Electronically Signed   By: Jacqulynn Cadet M.D.   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  Of note he was diagnosed with a left leg DVT in May 2023.  Patient completed the initial packet of Eliquis that was prescribed to him.  Upon going to the pharmacy for a refill, he was told by the pharmacy that the refill would be over $700.  They did not have the money and therefore they did not refill the medication.     DVT (deep venous thrombosis) (HCC) Right lower leg DVT in the common femoral vein History of left popliteal to mid left femoral DVT in May 2023 (04/07/22 [place]) - Continue bivalirudin gtt--change to po eliquis from tomorrow. Okay with Dr. Lucky Cowboy. 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  >50% time spent on counselling and coordination of care  Note: This dictation was prepared with Dragon dictation along with smaller phrase technology. Any transcriptional errors that result from this process are unintentional.  Fritzi Mandes M.D    [facility]   CC: Primary care physician; Norm Parcel, MD

## 2022-10-08 ENCOUNTER — Encounter: Payer: Self-pay | Admitting: Internal Medicine

## 2022-10-08 DIAGNOSIS — I82401 Acute embolism and thrombosis of unspecified deep veins of right lower extremity: Secondary | ICD-10-CM | POA: Diagnosis not present

## 2022-10-08 LAB — CBC
HCT: 29.4 % — ABNORMAL LOW (ref 39.0–52.0)
Hemoglobin: 9.8 g/dL — ABNORMAL LOW (ref 13.0–17.0)
MCH: 30.2 pg (ref 26.0–34.0)
MCHC: 33.3 g/dL (ref 30.0–36.0)
MCV: 90.7 fL (ref 80.0–100.0)
Platelets: 137 10*3/uL — ABNORMAL LOW (ref 150–400)
RBC: 3.24 MIL/uL — ABNORMAL LOW (ref 4.22–5.81)
RDW: 14.1 % (ref 11.5–15.5)
WBC: 8.7 10*3/uL (ref 4.0–10.5)
nRBC: 0 % (ref 0.0–0.2)

## 2022-10-08 LAB — APTT: aPTT: 63 seconds — ABNORMAL HIGH (ref 24–36)

## 2022-10-08 NOTE — Progress Notes (Signed)
Pt experiencing moderate increase swelling to RLE extremity, just above dressing at the thigh. Denies pain. RLE has been still and elevated, + palpable pulses throughout leg, and warm to touch. Dr. Lorenso Courier notified and made aware. Additional bandage wrap applied up through thigh area to groin.

## 2022-10-08 NOTE — Progress Notes (Signed)
1 Day Post-Op   Subjective/Chief Complaint: Doing OK. Without complaint. Denies pain. States his leg feels significantly better.   Objective: Vital signs in last 24 hours: Temp:  [97.7 F (36.5 C)-99.2 F (37.3 C)] 98 F (36.7 C) (11/18 0728) Pulse Rate:  [0-97] 90 (11/18 0728) Resp:  [0-29] 16 (11/18 0728) BP: (94-137)/(67-119) 117/82 (11/18 0728) SpO2:  [92 %-97 %] 96 % (11/18 0728) Last BM Date : 10/07/22  Intake/Output from previous day: 11/17 0701 - 11/18 0700 In: 1462.5 [P.O.:720; I.V.:742.5] Out: 3760 [Urine:3260; Blood:500] Intake/Output this shift: No intake/output data recorded.  General appearance: alert and no distress Cardio: regular rate and rhythm Extremities: RIGHT lower extremity- warm, pink, +DP, +motor/sensory, decreased edema, soft.  Lab Results:  Recent Labs    10/07/22 0541 10/08/22 0459  WBC 7.6 8.7  HGB 12.0* 9.8*  HCT 36.8* 29.4*  PLT 154 137*   BMET Recent Labs    10/06/22 1416 10/07/22 0541  NA 137 138  K 4.5 3.8  CL 106 110  CO2 21* 20*  GLUCOSE 124* 110*  BUN 24* 21  CREATININE 1.29* 1.02  CALCIUM 8.8* 8.0*   PT/INR Recent Labs    10/06/22 1416  LABPROT 15.2  INR 1.2   ABG No results for input(s): "PHART", "HCO3" in the last 72 hours.  Invalid input(s): "PCO2", "PO2"  Studies/Results: PERIPHERAL VASCULAR CATHETERIZATION  Result Date: 10/07/2022 See surgical note for result.  CT Angio Chest Pulmonary Embolism (PE) W or WO Contrast  Result Date: 10/06/2022 CLINICAL DATA:  Tachycardia, history of DVT in right lower extremity EXAM: CT ANGIOGRAPHY CHEST WITH CONTRAST TECHNIQUE: Multidetector CT imaging of the chest was performed using the standard protocol during bolus administration of intravenous contrast. Multiplanar CT image reconstructions and MIPs were obtained to evaluate the vascular anatomy. RADIATION DOSE REDUCTION: This exam was performed according to the departmental dose-optimization program which includes  automated exposure control, adjustment of the mA and/or kV according to patient size and/or use of iterative reconstruction technique. CONTRAST:  74m OMNIPAQUE IOHEXOL 350 MG/ML SOLN COMPARISON:  CT chest done on 06/11/2022 FINDINGS: Cardiovascular: There is homogeneous enhancement in thoracic aorta. Calcifications are seen in mitral annulus. There are no intraluminal filling defects in central pulmonary artery branches. Evaluation of small peripheral branches is limited by motion artifacts, especially in the lower lung fields. Mediastinum/Nodes: No significant lymphadenopathy is seen. Lungs/Pleura: Increased interstitial markings are seen in the periphery of both lungs, more so in the lower lung fields. There is no focal consolidation. There is slight improvement in aeration of lower lung fields. There is no pleural effusion or pneumothorax. Upper Abdomen: There is fatty infiltration in liver. There is subtle increased density in the dependent portion of gallbladder. There is no wall thickening in gallbladder. Small hiatal hernia is seen. Musculoskeletal: There is laminectomy and surgical fusion in the thoracic spine there is a metallic structure embedded in the bodies of T3, T4 and T5 vertebrae. Review of the MIP images confirms the above findings. IMPRESSION: There is no evidence of central pulmonary artery embolism. There is no evidence of thoracic aortic dissection. Increased interstitial markings are seen in both lungs, more so in the lower lung fields. 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 Possibility of superimposed interstitial pneumonia is not excluded. There is no pleural effusion or pneumothorax. Small hiatal hernia. There is subtle increased density in the dependent portion of gallbladder suggesting tiny gallbladder stones or sludge. Other findings as described in the body of the report.  Electronically Signed   By: Elmer Picker M.D.   On: 10/06/2022 16:13   US  Venous Img Lower Unilateral Right (DVT)  Result Date: 10/06/2022 CLINICAL DATA:  Right lower extremity edema EXAM: RIGHT LOWER EXTREMITY VENOUS DOPPLER ULTRASOUND TECHNIQUE: Gray-scale sonography with graded compression, as well as color Doppler and duplex ultrasound were performed to evaluate the lower extremity deep venous systems from the level of the common femoral vein and including the common femoral, femoral, profunda femoral, popliteal and calf veins including the posterior tibial, peroneal and gastrocnemius veins when visible. The superficial great saphenous vein was also interrogated. Spectral Doppler was utilized to evaluate flow at rest and with distal augmentation maneuvers in the common femoral, femoral and popliteal veins. COMPARISON:  None Available. FINDINGS: Contralateral Common Femoral Vein: Respiratory phasicity is normal and symmetric with the symptomatic side. No evidence of thrombus. Normal compressibility. Common Femoral Vein: The common femoral vein is not compressible. The vein is expanded and filled with low-level internal echoes. Findings are consistent with acute occlusive DVT. No evidence of color flow on color Doppler imaging. Saphenofemoral Junction: Occlusive DVT extends into the saphenofemoral junction. The remainder of the great saphenous vein is patent. Profunda Femoral Vein: Occlusive thrombus extends into the profunda femoral vein. Femoral Vein: Occlusive thrombus extends into the femoral vein throughout the thigh. Popliteal Vein: Occlusive thrombus extends into the popliteal vein. Calf Veins: Occlusive thrombus extends into the posterior tibial veins. Superficial Great Saphenous Vein: No evidence of thrombus. Normal compressibility. Venous Reflux:  None. Other Findings:  None. IMPRESSION: Positive for extensive acute occlusive DVT throughout the right lower extremity from the common femoral vein into the calf. These results will be called to the ordering clinician or  representative by the Radiologist Assistant, and communication documented in the PACS or [employer]. Electronically Signed   By: Jacqulynn Cadet M.D.   On: 10/06/2022 11:52    Anti-infectives: Anti-infectives (From admission, onward)    Start     Dose/Rate Route Frequency Ordered Stop   10/07/22 0600  ciprofloxacin (CIPRO) IVPB 400 mg        400 mg 200 mL/hr over 60 Minutes Intravenous 60 min pre-op 10/07/22 0256 10/07/22 1056   10/07/22 0018  ciprofloxacin (CIPRO) IVPB 400 mg  Status:  Discontinued        400 mg 200 mL/hr over 60 Minutes Intravenous 60 min pre-op 10/07/22 0018 10/07/22 0024   10/06/22 1445  linezolid (ZYVOX) IVPB 600 mg        600 mg 300 mL/hr over 60 Minutes Intravenous  Once 10/06/22 1442 10/06/22 1654   10/06/22 1430  ceFEPIme (MAXIPIME) 2 g in sodium chloride 0.9 % 100 mL IVPB        2 g 200 mL/hr over 30 Minutes Intravenous  Once 10/06/22 1417 10/06/22 1456       Assessment/Plan: s/p Procedure(s): PERIPHERAL VASCULAR THROMBECTOMY (Right) Monitor HgB Rewrap leg with ACE-toes-groin OK for OOB ambulate Continue Bivalirudin- transition to Eliquis in the next 24-48hrs  LOS: 2 days    Jamesetta So A 10/08/2022

## 2022-10-08 NOTE — Consult Note (Signed)
[facility] for Bivalirudin Infusion Indication: DVT  Allergies  Allergen Reactions   Penicillins Rash    Beta-lactam allergy, DRESS Beta-lactam allergy, DRESS    Shrimp [Shellfish Allergy] Hives   Cisatracurium Other (See Comments)    Other reaction(s): Unknown Other reaction(s): Unknown    Heparin Hives and Swelling    Patient's daughter reports patient had swelling/hives during hospital admission in [place]. They were on several medications at once so she is unsure whether it was VANCOMYCIN that caused this reaction or another concomitant medication.    Rocuronium Other (See Comments)    Other reaction(s): Unknown Other reaction(s): Unknown    Vancomycin Hives and Swelling    Patient's daughter reports patient had swelling/hives during hospital admission in [place]. They were on several medications at once so she is unsure whether it was VANCOMYCIN that caused this reaction or another concomitant medication.     Patient Measurements: Height: '5\' 10"'$  (177.8 cm) Weight: 96.6 kg (213 lb) IBW/kg (Calculated) : 73 Heparin Dosing Weight: 93.9kg  Vital Signs: Temp: 97.9 F (36.6 C) (11/18 0407) Temp Source: Oral (11/17 1958) BP: 109/73 (11/18 0407) Pulse Rate: 83 (11/18 0407)  Labs: Recent Labs    10/06/22 1416 10/06/22 1929 10/07/22 0055 10/07/22 0541 10/08/22 0459  HGB 12.7*  --   --  12.0* 9.8*  HCT 39.0  --   --  36.8* 29.4*  PLT 162  --   --  154 137*  APTT 30   < > 66* 75* 63*  LABPROT 15.2  --   --   --   --   INR 1.2  --   --   --   --   CREATININE 1.29*  --   --  1.02  --    < > = values in this interval not displayed.     Estimated Creatinine Clearance: 76.3 mL/min (by C-G formula based on SCr of 1.02 mg/dL).   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 They were on several medications at once so she is unsure whether it was  heparin that caused this reaction or another concomitant medication.   Assessment: Russell [name] is a 71 y.o. male presenting with RLE pain and swelling, found to have DVT. PMH significant for HTN, HLD, arthritis, recent DVT (03/2022 was on Eliquis but stopped 04/2022 due to cost). Patient was not actively on [facility] PTA per chart review. Daughter reports possible heparin allergy resulting in hives, discussed alternative agents with ED, including bivalirudin. Of note, patient has tolerated multiple doses of enoxaparin (LMWH) in the past. Given possibility of vascular intervention, MD provider would like to start bivalirudin. Pharmacy has been consulted to initiate and manage bivalrudin infusion.   Baseline Labs: aPTT 30, Hgb 12.7, Hct 39.0, Plt 162   Goal  of Therapy:  aPTT 50-85 seconds Monitor platelets by anticoagulation protocol: Yes   Date Time aPTT Rate (mg/h)/Comment  11/16 1929 29 14.1/SUBtherapeutic 11/17 0055 66 18.3/therapeutic x1 11/17 0541 75 18.3/therapeutic x2 11/18 0459 63 Therapeutic x 3  Plan:  Continue bivalirudin infusion at 18.3 mg/h (0.195 mg/kg/h) Infusion scheduled to stop @ 1000 and transition to Apixaban.  Renda Rolls, PharmD, [facility] 10/08/2022 6:15 AM

## 2022-10-08 NOTE — Progress Notes (Signed)
Progress Note    Russell [name]  QAS:[id number] DOB: 02-09-1951  DOA: 10/06/2022 PCP: Norm Parcel, MD      Brief Narrative:    Medical records reviewed and are as summarized below:  Russell [name] is a 71 y.o. male with medical history significant for left lower extremity DVT in May 2023, hyperlipidemia.  Of note, he had completed Eliquis starter pack for DVT in May when he was first diagnosed with left lower extremity DVT.  However, he did not refill prescription for Eliquis because he was told by the pharmacy is unable cost over $700.  He presented to the hospital with right leg pain and right lower extremity DVT on ultrasound done in the outpatient setting.  He was admitted to the hospital for right lower extremity DVT and was treated with IV bivalirudin drip.  He underwent mechanical thrombectomy to right superficial femoral vein, common femoral vein, external iliac vein, common iliac vein and inferior vena cava.      Assessment/Plan:   Principal Problem:   DVT, recurrent, lower extremity, acute, right (HCC) Active Problems:   HTN (hypertension)   Dyslipidemia   History of malnutrition    Body mass index is 30.56 kg/m.  (Obesity)  Acute right lower extremity DVT, history of left lower extremity DVT in May 2023: IV bivalirudin has been discontinued and she has been started on Eliquis.  Monitor patient overnight.  Other comorbidities include hypertension, hyperlipidemia  Plan of care was discussed with his wife at the bedside.  Plan was also discussed with Russell [name], daughter, over the phone.  She confirmed that patient will be able to afford Eliquis and she will ensure that patient gets refills going forward.  Diet Order             Diet Heart Room service appropriate? Yes; Fluid consistency: Thin  Diet effective now                            Consultants: Vascular surgeon  Procedures: Mechanical thrombectomy of right lower extremity  deep veins on 10/07/2022    Medications:    apixaban  10 mg Oral BID   Followed by   Derrill Memo ON 10/15/2022] apixaban  5 mg Oral BID   atorvastatin  20 mg Oral QHS   gabapentin  300 mg Oral TID   Continuous Infusions:   Anti-infectives (From admission, onward)    Start     Dose/Rate Route Frequency Ordered Stop   10/07/22 0600  ciprofloxacin (CIPRO) IVPB 400 mg        400 mg 200 mL/hr over 60 Minutes Intravenous 60 min pre-op 10/07/22 0256 10/07/22 1056   10/07/22 0018  ciprofloxacin (CIPRO) IVPB 400 mg  Status:  Discontinued        400 mg 200 mL/hr over 60 Minutes Intravenous 60 min pre-op 10/07/22 0018 10/07/22 0024   10/06/22 1445  linezolid (ZYVOX) IVPB 600 mg        600 mg 300 mL/hr over 60 Minutes Intravenous  Once 10/06/22 1442 10/06/22 1654   10/06/22 1430  ceFEPIme (MAXIPIME) 2 g in sodium chloride 0.9 % 100 mL IVPB        2 g 200 mL/hr over 30 Minutes Intravenous  Once 10/06/22 1417 10/06/22 1456              Family Communication/Anticipated D/C date and plan/Code Status   DVT prophylaxis: Place TED hose Start: 10/06/22 1457 apixaban (  ELIQUIS) tablet 10 mg  apixaban (ELIQUIS) tablet 5 mg     Code Status: Full Code  Family Communication: Plan discussed with wife and daughter Russell [name] Disposition Plan: Plan to discharge home tomorrow   Status is: Inpatient Remains inpatient appropriate because: Right lower extremity DVT status post thrombectomy       Subjective:   Interval events noted.  He complains of increasing swelling of the right thigh.  Right leg pain is better and he was able to ambulate in the hallway.  Wife is at the bedside.  Objective:    Vitals:   10/08/22 0407 10/08/22 0728 10/08/22 1117 10/08/22 1535  BP: 109/73 117/82 105/74 109/75  Pulse: 83 90 92 95  Resp: '16 16 18 18  '$ Temp: 97.9 F (36.6 C) 98 F (36.7 C) 97.9 F (36.6 C) 97.7 F (36.5 C)  TempSrc:    Oral  SpO2: 95% 96% 96% 95%  Weight:      Height:       No  data found.   Intake/Output Summary (Last 24 hours) at 10/08/2022 1621 Last data filed at 10/08/2022 1413 Gross per 24 hour  Intake 360 ml  Output 2435 ml  Net -2075 ml   Filed Weights   10/06/22 1223 10/06/22 1423  Weight: 93.9 kg 96.6 kg    Exam:  GEN: NAD SKIN: Warm and dry EYES: No pallor or icterus ENT: MMM CV: RRR PULM: CTA B ABD: soft, obese, NT, +BS CNS: AAO x 3, non focal EXT: Right lower extremity swelling covered with compression dressing        Data Reviewed:   I have personally reviewed following labs and imaging studies:  Labs: Labs show the following:   Basic Metabolic Panel: Recent Labs  Lab 10/06/22 1416 10/07/22 0541  NA 137 138  K 4.5 3.8  CL 106 110  CO2 21* 20*  GLUCOSE 124* 110*  BUN 24* 21  CREATININE 1.29* 1.02  CALCIUM 8.8* 8.0*   GFR Estimated Creatinine Clearance: 76.3 mL/min (by C-G formula based on SCr of 1.02 mg/dL). Liver Function Tests: Recent Labs  Lab 10/06/22 1416  AST 18  ALT 21  ALKPHOS 69  BILITOT 1.3*  PROT 7.3  ALBUMIN 3.9   No results for input(s): "LIPASE", "AMYLASE" in the last 168 hours. No results for input(s): "AMMONIA" in the last 168 hours. Coagulation profile Recent Labs  Lab 10/06/22 1416  INR 1.2    CBC: Recent Labs  Lab 10/06/22 1416 10/07/22 0541 10/08/22 0459  WBC 9.7 7.6 8.7  HGB 12.7* 12.0* 9.8*  HCT 39.0 36.8* 29.4*  MCV 91.5 90.9 90.7  PLT 162 154 137*   Cardiac Enzymes: No results for input(s): "CKTOTAL", "CKMB", "CKMBINDEX", "TROPONINI" in the last 168 hours. BNP (last 3 results) No results for input(s): "PROBNP" in the last 8760 hours. CBG: No results for input(s): "GLUCAP" in the last 168 hours. D-Dimer: No results for input(s): "DDIMER" in the last 72 hours. Hgb A1c: No results for input(s): "HGBA1C" in the last 72 hours. Lipid Profile: No results for input(s): "CHOL", "HDL", "LDLCALC", "TRIG", "CHOLHDL", "LDLDIRECT" in the last 72 hours. Thyroid function  studies: No results for input(s): "TSH", "T4TOTAL", "T3FREE", "THYROIDAB" in the last 72 hours.  Invalid input(s): "FREET3" Anemia work up: No results for input(s): "VITAMINB12", "FOLATE", "FERRITIN", "TIBC", "IRON", "RETICCTPCT" in the last 72 hours. Sepsis Labs: Recent Labs  Lab 10/06/22 1416 10/06/22 1533 10/07/22 0541 10/08/22 0459  WBC 9.7  --  7.6 8.7  LATICACIDVEN  1.6 1.1  --   --     Microbiology Recent Results (from the past 240 hour(s))  Blood Culture (routine x 2)     Status: None (Preliminary result)   Collection Time: 10/06/22  2:17 PM   Specimen: BLOOD  Result Value Ref Range Status   Specimen Description BLOOD BLOOD LEFT ARM  Final   Special Requests   Final    BOTTLES DRAWN AEROBIC AND ANAEROBIC Blood Culture adequate volume   Culture   Final    NO GROWTH 2 DAYS Performed at [facility], [address] [place], [place] [address]    Report Status PENDING  Incomplete  Blood Culture (routine x 2)     Status: None (Preliminary result)   Collection Time: 10/06/22  3:31 PM   Specimen: BLOOD  Result Value Ref Range Status   Specimen Description BLOOD BLOOD LEFT ARM  Final   Special Requests   Final    BOTTLES DRAWN AEROBIC AND ANAEROBIC Blood Culture adequate volume   Culture   Final    NO GROWTH 2 DAYS Performed at [facility], [address] [place], [place] [address]    Report Status PENDING  Incomplete    Procedures and diagnostic studies:  PERIPHERAL VASCULAR CATHETERIZATION  Result Date: 10/07/2022 See surgical note for result.              LOS: 2 days   Dasean Brow  [facility]   Pager on [email or web address]. If 7PM-7AM, please contact night-coverage at [email or web address]     10/08/2022, 4:21 PM

## 2022-10-09 DIAGNOSIS — I82401 Acute embolism and thrombosis of unspecified deep veins of right lower extremity: Secondary | ICD-10-CM | POA: Diagnosis not present

## 2022-10-09 DIAGNOSIS — I10 Essential (primary) hypertension: Secondary | ICD-10-CM | POA: Diagnosis not present

## 2022-10-09 MED ORDER — GUAIFENESIN-DM 100-10 MG/5ML PO SYRP
5.0000 mL | ORAL_SOLUTION | Freq: Once | ORAL | Status: AC
  Administered 2022-10-09: 5 mL via ORAL
  Filled 2022-10-09: qty 10

## 2022-10-09 MED ORDER — APIXABAN 5 MG PO TABS: 5.0000 mg | ORAL_TABLET | Freq: Two times a day (BID) | ORAL | 0 refills | Status: DC

## 2022-10-09 MED ORDER — APIXABAN 5 MG PO TABS: 10.0000 mg | ORAL_TABLET | Freq: Two times a day (BID) | ORAL | 0 refills | Status: DC

## 2022-10-09 NOTE — Discharge Summary (Signed)
Physician Discharge Summary   Patient: Russell [name] [name]: [id number] DOB: 1951/08/07  Admit date:     10/06/2022  Discharge date: 10/09/22  Discharge Physician: Jennye Boroughs   PCP: Norm Parcel, MD   Recommendations at discharge:   Follow-up with PCP in 1 week  Discharge Diagnoses: Principal Problem:   DVT, recurrent, lower extremity, acute, right ([place]) Active Problems:   HTN (hypertension)   Dyslipidemia   History of malnutrition  Resolved Problems:   * No resolved hospital problems. [facility] Course:  Russell [name] is a 71 y.o. male with medical history significant for left lower extremity DVT in May 2023, hyperlipidemia.  Of note, he had completed Eliquis starter pack for DVT in May when he was first diagnosed with left lower extremity DVT.  However, he did not refill prescription for Eliquis because he was told by the pharmacy is unable cost over $700.  He presented to the hospital with right leg pain and right lower extremity DVT on ultrasound done in the outpatient setting.   He was admitted to the hospital for right lower extremity DVT and was treated with IV bivalirudin drip.  He underwent mechanical thrombectomy to right superficial femoral vein, common femoral vein, external iliac vein, common iliac vein and inferior vena cava.  He was then started on Eliquis for long-term anticoagulation.  Russell [name], daughter, confirmed that patient has insurance to cover Eliquis prescription and she said she will make sure that patient obtains refills.   His condition has improved and he is deemed stable for discharge to home today.  Discharge plan was discussed with the patient and his wife at the bedside.          Consultants: Vascular surgeon Procedures performed: Mechanical thrombectomy of right lower extremity deep veins on 10/07/2022   Disposition: Home Diet recommendation:  Discharge Diet Orders (From admission, onward)     Start     Ordered   10/09/22  0000  Diet - low sodium heart healthy        10/09/22 1406           Cardiac diet DISCHARGE MEDICATION: Allergies as of 10/09/2022       Reactions   Penicillins Rash   Beta-lactam allergy, DRESS Beta-lactam allergy, DRESS   Shrimp [shellfish Allergy] Hives   Cisatracurium Other (See Comments)   Other reaction(s): Unknown Other reaction(s): Unknown   Heparin Hives, Swelling   Patient's daughter reports patient had swelling/hives during hospital admission in [place]. They were on several medications at once so she is unsure whether it was VANCOMYCIN that caused this reaction or another concomitant medication.    Rocuronium Other (See Comments)   Other reaction(s): Unknown Other reaction(s): Unknown   Vancomycin Hives, Swelling   Patient's daughter reports patient had swelling/hives during hospital admission in [place]. They were on several medications at once so she is unsure whether it was VANCOMYCIN that caused this reaction or another concomitant medication.         Medication List     STOP taking these medications    Apixaban Starter Pack ('10mg'$  and '5mg'$ ) Commonly known as: ELIQUIS STARTER PACK Replaced by: apixaban 5 MG Tabs tablet   atorvastatin 20 MG tablet Commonly known as: LIPITOR   doxycycline 100 MG capsule Commonly known as: VIBRAMYCIN   naproxen 500 MG tablet Commonly known as: NAPROSYN       TAKE these medications    apixaban 5 MG Tabs tablet Commonly known as: ELIQUIS Take 2  tablets (10 mg total) by mouth 2 (two) times daily for 6 days. Replaces: Apixaban Starter Pack ('10mg'$  and '5mg'$ )   apixaban 5 MG Tabs tablet Commonly known as: ELIQUIS Take 1 tablet (5 mg total) by mouth 2 (two) times daily. Start taking on: October 15, 2022   gabapentin 300 MG capsule Commonly known as: NEURONTIN Take 300 mg by mouth 3 (three) times daily.   HYDROcodone-acetaminophen 5-325 MG tablet Commonly known as: NORCO/VICODIN Take 1 tablet by mouth every 6 (six)  hours as needed for moderate pain.   lidocaine 5 % ointment Commonly known as: XYLOCAINE Apply 1 Application topically 3 (three) times daily as needed for mild pain.   methocarbamol 750 MG tablet Commonly known as: ROBAXIN Take 750 mg by mouth every 8 (eight) hours as needed for muscle spasms.   ondansetron 4 MG disintegrating tablet Commonly known as: ZOFRAN-ODT Take 1 tablet (4 mg total) by mouth every 8 (eight) hours as needed for nausea or vomiting.   ondansetron 4 MG tablet Commonly known as: ZOFRAN Take 4 mg by mouth every 8 (eight) hours as needed.   triamcinolone lotion 0.1 % Commonly known as: KENALOG Apply 1 Application topically 3 (three) times daily.        Discharge Exam: Filed Weights   10/06/22 1223 10/06/22 1423  Weight: 93.9 kg 96.6 kg   GEN: NAD SKIN: Erythematous changes on the right thigh EYES: No pallor or icterus ENT: MMM CV: RRR PULM: CTA B ABD: soft, ND, NT, +BS CNS: AAO x 3, non focal EXT: Right lower extremity swelling   Condition at discharge: good  The results of significant diagnostics from this hospitalization (including imaging, microbiology, ancillary and laboratory) are listed below for reference.   Imaging Studies: PERIPHERAL VASCULAR CATHETERIZATION  Result Date: 10/07/2022 See surgical note for result.  CT Angio Chest Pulmonary Embolism (PE) W or WO Contrast  Result Date: 10/06/2022 CLINICAL DATA:  Tachycardia, history of DVT in right lower extremity EXAM: CT ANGIOGRAPHY CHEST WITH CONTRAST TECHNIQUE: Multidetector CT imaging of the chest was performed using the standard protocol during bolus administration of intravenous contrast. Multiplanar CT image reconstructions and MIPs were obtained to evaluate the vascular anatomy. RADIATION DOSE REDUCTION: This exam was performed according to the departmental dose-optimization program which includes automated exposure control, adjustment of the mA and/or kV according to patient size  and/or use of iterative reconstruction technique. CONTRAST:  15m OMNIPAQUE IOHEXOL 350 MG/ML SOLN COMPARISON:  CT chest done on 06/11/2022 FINDINGS: Cardiovascular: There is homogeneous enhancement in thoracic aorta. Calcifications are seen in mitral annulus. There are no intraluminal filling defects in central pulmonary artery branches. Evaluation of small peripheral branches is limited by motion artifacts, especially in the lower lung fields. Mediastinum/Nodes: No significant lymphadenopathy is seen. Lungs/Pleura: Increased interstitial markings are seen in the periphery of both lungs, more so in the lower lung fields. There is no focal consolidation. There is slight improvement in aeration of lower lung fields. There is no pleural effusion or pneumothorax. Upper Abdomen: There is fatty infiltration in liver. There is subtle increased density in the dependent portion of gallbladder. There is no wall thickening in gallbladder. Small hiatal hernia is seen. Musculoskeletal: There is laminectomy and surgical fusion in the thoracic spine there is a metallic structure embedded in the bodies of T3, T4 and T5 vertebrae. Review of the MIP images confirms the above findings. IMPRESSION: There is no evidence of central pulmonary artery embolism. There is no evidence of thoracic aortic dissection. Increased  interstitial markings are seen in both lungs, more so in the lower lung fields. 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 Possibility of superimposed interstitial pneumonia is not excluded. There is no pleural effusion or pneumothorax. Small hiatal hernia. There is subtle increased density in the dependent portion of gallbladder suggesting tiny gallbladder stones or sludge. Other findings as described in the body of the report. Electronically Signed   By: Elmer Picker M.D.   On: 10/06/2022 16:13   US Venous Img Lower Unilateral Right (DVT)  Result Date: 10/06/2022 CLINICAL DATA:  Right  lower extremity edema EXAM: RIGHT LOWER EXTREMITY VENOUS DOPPLER ULTRASOUND TECHNIQUE: Gray-scale sonography with graded compression, as well as color Doppler and duplex ultrasound were performed to evaluate the lower extremity deep venous systems from the level of the common femoral vein and including the common femoral, femoral, profunda femoral, popliteal and calf veins including the posterior tibial, peroneal and gastrocnemius veins when visible. The superficial great saphenous vein was also interrogated. Spectral Doppler was utilized to evaluate flow at rest and with distal augmentation maneuvers in the common femoral, femoral and popliteal veins. COMPARISON:  None Available. FINDINGS: Contralateral Common Femoral Vein: Respiratory phasicity is normal and symmetric with the symptomatic side. No evidence of thrombus. Normal compressibility. Common Femoral Vein: The common femoral vein is not compressible. The vein is expanded and filled with low-level internal echoes. Findings are consistent with acute occlusive DVT. No evidence of color flow on color Doppler imaging. Saphenofemoral Junction: Occlusive DVT extends into the saphenofemoral junction. The remainder of the great saphenous vein is patent. Profunda Femoral Vein: Occlusive thrombus extends into the profunda femoral vein. Femoral Vein: Occlusive thrombus extends into the femoral vein throughout the thigh. Popliteal Vein: Occlusive thrombus extends into the popliteal vein. Calf Veins: Occlusive thrombus extends into the posterior tibial veins. Superficial Great Saphenous Vein: No evidence of thrombus. Normal compressibility. Venous Reflux:  None. Other Findings:  None. IMPRESSION: Positive for extensive acute occlusive DVT throughout the right lower extremity from the common femoral vein into the calf. These results will be called to the ordering clinician or representative by the Radiologist Assistant, and communication documented in the PACS or [employer]. Electronically Signed   By: Jacqulynn Cadet M.D.   On: 10/06/2022 11:52   DG Lumbar Spine 2-3 Views  Result Date: 09/22/2022 CLINICAL DATA:  Low back pain and left leg pain EXAM: LUMBAR SPINE - 2-3 VIEW COMPARISON:  09/01/2021 FINDINGS: There is no evidence of acute lumbar spine fracture. Alignment is normal. Intervertebral disc spaces are maintained. Mild lower lumbar facet arthropathy. Unchanged appearance of the IVC filter with fractured limb. Multiple vascular coils within the abdomen. IMPRESSION: Mild lower lumbar facet arthropathy. No acute findings. Electronically Signed   By: Davina Poke D.O.   On: 09/22/2022 13:43    Microbiology: Results for orders placed or performed during the hospital encounter of 10/06/22  Blood Culture (routine x 2)     Status: None (Preliminary result)   Collection Time: 10/06/22  2:17 PM   Specimen: BLOOD  Result Value Ref Range Status   Specimen Description BLOOD BLOOD LEFT ARM  Final   Special Requests   Final    BOTTLES DRAWN AEROBIC AND ANAEROBIC Blood Culture adequate volume   Culture   Final    NO GROWTH 3 DAYS Performed at [facility], [address], [place], [place] [address]    Report Status PENDING  Incomplete  Blood Culture (routine x 2)  Status: None (Preliminary result)   Collection Time: 10/06/22  3:31 PM   Specimen: BLOOD  Result Value Ref Range Status   Specimen Description BLOOD BLOOD LEFT ARM  Final   Special Requests   Final    BOTTLES DRAWN AEROBIC AND ANAEROBIC Blood Culture adequate volume   Culture   Final    NO GROWTH 3 DAYS Performed at [facility], [address] [place], [place] [address]    Report Status PENDING  Incomplete    Labs: CBC: Recent Labs  Lab 10/06/22 1416 10/07/22 0541 10/08/22 0459  WBC 9.7 7.6 8.7  HGB 12.7* 12.0* 9.8*  HCT 39.0 36.8* 29.4*  MCV 91.5 90.9 90.7  PLT 162 154 863*   Basic Metabolic Panel: Recent Labs  Lab 10/06/22 1416 10/07/22 0541   NA 137 138  K 4.5 3.8  CL 106 110  CO2 21* 20*  GLUCOSE 124* 110*  BUN 24* 21  CREATININE 1.29* 1.02  CALCIUM 8.8* 8.0*   Liver Function Tests: Recent Labs  Lab 10/06/22 1416  AST 18  ALT 21  ALKPHOS 69  BILITOT 1.3*  PROT 7.3  ALBUMIN 3.9   CBG: No results for input(s): "GLUCAP" in the last 168 hours.  Discharge time spent: less than 30 minutes.  Signed: Jennye Boroughs, MD [facility] 10/09/2022

## 2022-10-10 ENCOUNTER — Encounter: Payer: Self-pay | Admitting: Vascular Surgery

## 2022-10-11 LAB — CULTURE, BLOOD (ROUTINE X 2)
Culture: NO GROWTH
Culture: NO GROWTH
Special Requests: ADEQUATE
Special Requests: ADEQUATE

## 2022-10-24 ENCOUNTER — Telehealth (INDEPENDENT_AMBULATORY_CARE_PROVIDER_SITE_OTHER): Payer: Self-pay | Admitting: Vascular Surgery

## 2022-10-24 NOTE — Telephone Encounter (Signed)
Patient's daughter called and wanted to know if she should bring patient in earlier due to his right leg is swollen where he had the surgery and she would like to speak to the doctor not the nurse.  Please advise.

## 2022-10-25 ENCOUNTER — Emergency Department: Payer: Medicare Other

## 2022-10-25 ENCOUNTER — Inpatient Hospital Stay
Admission: EM | Admit: 2022-10-25 | Discharge: 2022-10-31 | DRG: 271 | Disposition: A | Discharge status: Home / Self Care | Payer: Medicare Other | Attending: Internal Medicine | Admitting: Internal Medicine

## 2022-10-25 ENCOUNTER — Encounter: Payer: Self-pay | Admitting: Emergency Medicine

## 2022-10-25 ENCOUNTER — Other Ambulatory Visit: Payer: Self-pay

## 2022-10-25 DIAGNOSIS — Z1152 Encounter for screening for COVID-19: Secondary | ICD-10-CM

## 2022-10-25 DIAGNOSIS — Z79899 Other long term (current) drug therapy: Secondary | ICD-10-CM | POA: Diagnosis not present

## 2022-10-25 DIAGNOSIS — Z86718 Personal history of other venous thrombosis and embolism: Secondary | ICD-10-CM | POA: Diagnosis not present

## 2022-10-25 DIAGNOSIS — I739 Peripheral vascular disease, unspecified: Secondary | ICD-10-CM | POA: Diagnosis present

## 2022-10-25 DIAGNOSIS — Z881 Allergy status to other antibiotic agents status: Secondary | ICD-10-CM

## 2022-10-25 DIAGNOSIS — E669 Obesity, unspecified: Secondary | ICD-10-CM | POA: Diagnosis present

## 2022-10-25 DIAGNOSIS — Z7901 Long term (current) use of anticoagulants: Secondary | ICD-10-CM

## 2022-10-25 DIAGNOSIS — Z9049 Acquired absence of other specified parts of digestive tract: Secondary | ICD-10-CM | POA: Diagnosis not present

## 2022-10-25 DIAGNOSIS — I82220 Acute embolism and thrombosis of inferior vena cava: Secondary | ICD-10-CM | POA: Diagnosis not present

## 2022-10-25 DIAGNOSIS — I10 Essential (primary) hypertension: Secondary | ICD-10-CM | POA: Diagnosis not present

## 2022-10-25 DIAGNOSIS — Z9889 Other specified postprocedural states: Secondary | ICD-10-CM | POA: Diagnosis not present

## 2022-10-25 DIAGNOSIS — D62 Acute posthemorrhagic anemia: Secondary | ICD-10-CM | POA: Diagnosis not present

## 2022-10-25 DIAGNOSIS — E876 Hypokalemia: Secondary | ICD-10-CM | POA: Diagnosis not present

## 2022-10-25 DIAGNOSIS — D649 Anemia, unspecified: Secondary | ICD-10-CM | POA: Diagnosis not present

## 2022-10-25 DIAGNOSIS — R579 Shock, unspecified: Secondary | ICD-10-CM | POA: Diagnosis not present

## 2022-10-25 DIAGNOSIS — J849 Interstitial pulmonary disease, unspecified: Secondary | ICD-10-CM | POA: Diagnosis present

## 2022-10-25 DIAGNOSIS — Z91013 Allergy to seafood: Secondary | ICD-10-CM | POA: Diagnosis not present

## 2022-10-25 DIAGNOSIS — E2740 Unspecified adrenocortical insufficiency: Secondary | ICD-10-CM | POA: Diagnosis present

## 2022-10-25 DIAGNOSIS — M1990 Unspecified osteoarthritis, unspecified site: Secondary | ICD-10-CM | POA: Diagnosis present

## 2022-10-25 DIAGNOSIS — Z6830 Body mass index (BMI) 30.0-30.9, adult: Secondary | ICD-10-CM

## 2022-10-25 DIAGNOSIS — R339 Retention of urine, unspecified: Secondary | ICD-10-CM | POA: Diagnosis not present

## 2022-10-25 DIAGNOSIS — E785 Hyperlipidemia, unspecified: Secondary | ICD-10-CM | POA: Diagnosis present

## 2022-10-25 DIAGNOSIS — I871 Compression of vein: Secondary | ICD-10-CM | POA: Diagnosis not present

## 2022-10-25 DIAGNOSIS — Z880 Allergy status to penicillin: Secondary | ICD-10-CM | POA: Diagnosis not present

## 2022-10-25 DIAGNOSIS — Z86711 Personal history of pulmonary embolism: Secondary | ICD-10-CM | POA: Diagnosis not present

## 2022-10-25 DIAGNOSIS — I82401 Acute embolism and thrombosis of unspecified deep veins of right lower extremity: Secondary | ICD-10-CM | POA: Diagnosis present

## 2022-10-25 DIAGNOSIS — I82421 Acute embolism and thrombosis of right iliac vein: Secondary | ICD-10-CM | POA: Diagnosis not present

## 2022-10-25 DIAGNOSIS — I959 Hypotension, unspecified: Secondary | ICD-10-CM | POA: Diagnosis not present

## 2022-10-25 DIAGNOSIS — I82409 Acute embolism and thrombosis of unspecified deep veins of unspecified lower extremity: Secondary | ICD-10-CM | POA: Diagnosis present

## 2022-10-25 DIAGNOSIS — Z888 Allergy status to other drugs, medicaments and biological substances status: Secondary | ICD-10-CM

## 2022-10-25 DIAGNOSIS — R578 Other shock: Secondary | ICD-10-CM | POA: Diagnosis not present

## 2022-10-25 DIAGNOSIS — I119 Hypertensive heart disease without heart failure: Secondary | ICD-10-CM | POA: Diagnosis present

## 2022-10-25 DIAGNOSIS — Z91199 Patient's noncompliance with other medical treatment and regimen due to unspecified reason: Secondary | ICD-10-CM

## 2022-10-25 DIAGNOSIS — E861 Hypovolemia: Secondary | ICD-10-CM | POA: Diagnosis not present

## 2022-10-25 DIAGNOSIS — I82411 Acute embolism and thrombosis of right femoral vein: Principal | ICD-10-CM | POA: Diagnosis present

## 2022-10-25 DIAGNOSIS — I9589 Other hypotension: Secondary | ICD-10-CM | POA: Diagnosis not present

## 2022-10-25 DIAGNOSIS — Z981 Arthrodesis status: Secondary | ICD-10-CM

## 2022-10-25 DIAGNOSIS — T82868A Thrombosis of vascular prosthetic devices, implants and grafts, initial encounter: Secondary | ICD-10-CM | POA: Diagnosis not present

## 2022-10-25 LAB — CBC WITH DIFFERENTIAL/PLATELET
Abs Immature Granulocytes: 0.02 10*3/uL (ref 0.00–0.07)
Basophils Absolute: 0.1 10*3/uL (ref 0.0–0.1)
Basophils Relative: 1 %
Eosinophils Absolute: 0.1 10*3/uL (ref 0.0–0.5)
Eosinophils Relative: 2 %
HCT: 37 % — ABNORMAL LOW (ref 39.0–52.0)
Hemoglobin: 11.2 g/dL — ABNORMAL LOW (ref 13.0–17.0)
Immature Granulocytes: 0 %
Lymphocytes Relative: 22 %
Lymphs Abs: 1.4 10*3/uL (ref 0.7–4.0)
MCH: 29.2 pg (ref 26.0–34.0)
MCHC: 30.3 g/dL (ref 30.0–36.0)
MCV: 96.6 fL (ref 80.0–100.0)
Monocytes Absolute: 0.7 10*3/uL (ref 0.1–1.0)
Monocytes Relative: 11 %
Neutro Abs: 4 10*3/uL (ref 1.7–7.7)
Neutrophils Relative %: 64 %
Platelets: 262 10*3/uL (ref 150–400)
RBC: 3.83 MIL/uL — ABNORMAL LOW (ref 4.22–5.81)
RDW: 14.7 % (ref 11.5–15.5)
WBC: 6.3 10*3/uL (ref 4.0–10.5)
nRBC: 0 % (ref 0.0–0.2)

## 2022-10-25 LAB — RESP PANEL BY RT-PCR (FLU A&B, COVID) ARPGX2
Influenza A by PCR: NEGATIVE
Influenza B by PCR: NEGATIVE
SARS Coronavirus 2 by RT PCR: NEGATIVE

## 2022-10-25 LAB — COMPREHENSIVE METABOLIC PANEL
ALT: 20 U/L (ref 0–44)
AST: 14 U/L — ABNORMAL LOW (ref 15–41)
Albumin: 4 g/dL (ref 3.5–5.0)
Alkaline Phosphatase: 75 U/L (ref 38–126)
Anion gap: 9 (ref 5–15)
BUN: 20 mg/dL (ref 8–23)
CO2: 17 mmol/L — ABNORMAL LOW (ref 22–32)
Calcium: 9.1 mg/dL (ref 8.9–10.3)
Chloride: 113 mmol/L — ABNORMAL HIGH (ref 98–111)
Creatinine, Ser: 1.01 mg/dL (ref 0.61–1.24)
GFR, Estimated: >60 mL/min (ref >60)
Glucose, Bld: 103 mg/dL — ABNORMAL HIGH (ref 70–99)
Potassium: 4.3 mmol/L (ref 3.5–5.1)
Sodium: 139 mmol/L (ref 135–145)
Total Bilirubin: 1.1 mg/dL (ref 0.3–1.2)
Total Protein: 7.2 g/dL (ref 6.5–8.1)

## 2022-10-25 LAB — URINALYSIS, ROUTINE W REFLEX MICROSCOPIC
Bilirubin Urine: NEGATIVE
Glucose, UA: NEGATIVE mg/dL
Ketones, ur: NEGATIVE mg/dL
Nitrite: NEGATIVE
Protein, ur: NEGATIVE mg/dL
Specific Gravity, Urine: 1.014 (ref 1.005–1.030)
pH: 6 (ref 5.0–8.0)

## 2022-10-25 LAB — BRAIN NATRIURETIC PEPTIDE: B Natriuretic Peptide: 55.1 pg/mL (ref 0.0–100.0)

## 2022-10-25 LAB — PROTIME-INR
INR: 1.5 — ABNORMAL HIGH (ref 0.8–1.2)
Prothrombin Time: 18.4 seconds — ABNORMAL HIGH (ref 11.4–15.2)

## 2022-10-25 LAB — APTT: aPTT: 63 seconds — ABNORMAL HIGH (ref 24–36)

## 2022-10-25 MED ORDER — OXYCODONE-ACETAMINOPHEN 5-325 MG PO TABS: 1.0000 | ORAL_TABLET | ORAL | Status: DC | PRN

## 2022-10-25 MED ORDER — SODIUM CHLORIDE 0.9 % IV SOLN
0.1950 mg/kg/h | INTRAVENOUS | Status: DC
  Administered 2022-10-25 – 2022-10-26 (×2): 0.195 mg/kg/h via INTRAVENOUS
  Filled 2022-10-25 (×3): qty 250

## 2022-10-25 MED ORDER — ACETAMINOPHEN 325 MG PO TABS
650.0000 mg | ORAL_TABLET | Freq: Four times a day (QID) | ORAL | Status: DC | PRN
  Administered 2022-10-26 – 2022-10-27 (×2): 650 mg via ORAL
  Filled 2022-10-25 (×2): qty 2

## 2022-10-25 MED ORDER — ALBUTEROL SULFATE HFA 108 (90 BASE) MCG/ACT IN AERS: 2.0000 | INHALATION_SPRAY | RESPIRATORY_TRACT | Status: DC | PRN

## 2022-10-25 MED ORDER — GABAPENTIN 300 MG PO CAPS
300.0000 mg | ORAL_CAPSULE | Freq: Three times a day (TID) | ORAL | Status: DC
  Administered 2022-10-25 – 2022-10-31 (×17): 300 mg via ORAL
  Filled 2022-10-25 (×18): qty 1

## 2022-10-25 MED ORDER — ALBUTEROL SULFATE (2.5 MG/3ML) 0.083% IN NEBU: 2.5000 mg | INHALATION_SOLUTION | RESPIRATORY_TRACT | Status: DC | PRN

## 2022-10-25 MED ORDER — ONDANSETRON HCL 4 MG/2ML IJ SOLN: 4.0000 mg | Freq: Three times a day (TID) | INTRAMUSCULAR | Status: DC | PRN

## 2022-10-25 MED ORDER — HYDRALAZINE HCL 20 MG/ML IJ SOLN: 5.0000 mg | INTRAMUSCULAR | Status: DC | PRN

## 2022-10-25 MED ORDER — DM-GUAIFENESIN ER 30-600 MG PO TB12: 1.0000 | ORAL_TABLET | Freq: Two times a day (BID) | ORAL | Status: DC | PRN

## 2022-10-25 NOTE — ED Notes (Signed)
Called and notified pharm that this RN and Tom RN cannot find dose of angiomax anywhere in main ER or flex ER; have checked tube stations, nurse stations and pt's bedside table. Pharm states will mix a dose and send to flex ED as soon as possible. Pt sleeping; chest rise and fall noted. Will start med once received.

## 2022-10-25 NOTE — H&P (View-Only) (Signed)
Russell [name]  [name] : [id number]  Russell [name] is a 71 y.o. (1951/07/19) male who presents with chief complaint of  Chief Complaint  Patient presents with   Cough   Leg Swelling  .   Consulting Physician:Dr. Lavonia Drafts MD Reason for consult:Right Leg Swelling and Cough History of Present Illness:   Russell [name] is a 71 y.o. male who presents with complaints of right leg swelling, he also reports he is having upper respiratory symptoms for about a week now.  Patient reports his legs have been swollen since he had  IVC venogram and right lower extremity venogram with mechanical thrombectomy.  Review of records demonstrates the patient had a mechanical thrombectomy on November 17 for extensive clot extending all the way from his popliteal vein to his IVC.   Today the patient presents to the emergency department with a swollen right leg approximately twice the size of his left leg.  Patient states is very tight and it burns.  Patient states is difficult to walk and is very painful.  He denies any fevers or chills.  Denies any open sores or cuts to the lower extremity.  He currently is using a cane to ambulate.  He currently states that he has been on Eliquis and is taking every day as required.  After looking into his chart the starter pack of Eliquis he was given expired on 10/15/2022.  I asked the patient if he had gotten his refill of his Eliquis and was taking it?  Patient states "I think so ".  Russell [name] has a history of not taking his Eliquis.  Current Facility-Administered Medications  Medication Dose Route Frequency Provider Last Rate Last Admin   acetaminophen (TYLENOL) tablet 650 mg  650 mg Oral Q6H PRN Ivor Costa, MD       albuterol (PROVENTIL) (2.5 MG/3ML) 0.083% nebulizer solution 2.5 mg  2.5 mg Nebulization Q4H PRN Alison Murray, RPH       bivalirudin (ANGIOMAX) 250 mg in sodium chloride 0.9 % 500 mL (0.5 mg/mL) infusion   0.195 mg/kg/hr (Order-Specific) Intravenous Continuous Beers, Shanon Brow, RPH       dextromethorphan-guaiFENesin (MUCINEX DM) 30-600 MG per 12 hr tablet 1 tablet  1 tablet Oral BID PRN Ivor Costa, MD       hydrALAZINE (APRESOLINE) injection 5 mg  5 mg Intravenous Q2H PRN Ivor Costa, MD       ondansetron [facility]) injection 4 mg  4 mg Intravenous Q8H PRN Ivor Costa, MD       oxyCODONE-acetaminophen (PERCOCET/ROXICET) 5-325 MG per tablet 1 tablet  1 tablet Oral Q4H PRN Ivor Costa, MD       Current Outpatient Medications  Medication Sig Dispense Refill   apixaban (ELIQUIS) 5 MG TABS tablet Take 2 tablets (10 mg total) by mouth 2 (two) times daily for 6 days. 24 tablet 0   apixaban (ELIQUIS) 5 MG TABS tablet Take 1 tablet (5 mg total) by mouth 2 (two) times daily. 60 tablet 0   gabapentin (NEURONTIN) 300 MG capsule Take 300 mg by mouth 3 (three) times daily.     HYDROcodone-acetaminophen (NORCO/VICODIN) 5-325 MG tablet Take 1 tablet by mouth every 6 (six) hours as needed for moderate pain. 12 tablet 0   lidocaine (XYLOCAINE) 5 % ointment Apply 1 Application topically 3 (three) times daily as needed for mild pain.     methocarbamol (ROBAXIN) 750 MG tablet Take 750 mg by mouth every 8 (eight) hours  as needed for muscle spasms.     ondansetron (ZOFRAN) 4 MG tablet Take 4 mg by mouth every 8 (eight) hours as needed.     ondansetron (ZOFRAN-ODT) 4 MG disintegrating tablet Take 1 tablet (4 mg total) by mouth every 8 (eight) hours as needed for nausea or vomiting. 10 tablet 0   triamcinolone lotion (KENALOG) 0.1 % Apply 1 Application topically 3 (three) times daily.      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   Hyperlipidemia    Hypertension     Past Surgical History:  Procedure Laterality Date   BACK SURGERY     COLONOSCOPY WITH PROPOFOL N/A 11/05/2019   Procedure: COLONOSCOPY WITH PROPOFOL;  Surgeon: [name], MD;  Location: [facility]  ENDOSCOPY;  Service: Gastroenterology;  Laterality: N/A;   PARTIAL COLECTOMY N/A 02/11/2020   Procedure: PARTIAL COLECTOMY -- sigmoid;  Surgeon: Olean Ree, MD;  Location: [facility] ORS;  Service: General;  Laterality: N/A;   PERIPHERAL VASCULAR THROMBECTOMY Right 10/07/2022   Procedure: PERIPHERAL VASCULAR THROMBECTOMY;  Surgeon: Algernon Huxley, MD;  Location: [place];  Service: Cardiovascular;  Laterality: Right;   SPINE SURGERY     TAKE DOWN OF INTESTINAL FISTULA N/A 02/11/2020   Procedure: TAKE DOWN OF INTESTINAL FISTULA -- colovesical fistula;  Surgeon: Olean Ree, MD;  Location: [facility] ORS;  Service: General;  Laterality: N/A;   TRACHEAL SURGERY      Social History Social History   Tobacco Use   Smoking status: Never   Smokeless tobacco: Never  Vaping Use   Vaping Use: Never used  Substance Use Topics   Alcohol use: No   Drug use: No    Family History Family History  Problem Relation Age of Onset   Prostate cancer Neg Hx    Bladder Cancer Neg Hx    Kidney cancer Neg Hx     Allergies  Allergen Reactions   Penicillins Rash    Beta-lactam allergy, DRESS Beta-lactam allergy, DRESS    Shrimp [Shellfish Allergy] Hives   Cisatracurium Other (See Comments)    Other reaction(s): Unknown Other reaction(s): Unknown    Heparin Hives and Swelling    Patient's daughter reports patient had swelling/hives during hospital admission in [place]. They were on several medications at once so she is unsure whether it was VANCOMYCIN that caused this reaction or another concomitant medication.    Rocuronium Other (See Comments)    Other reaction(s): Unknown Other reaction(s): Unknown    Vancomycin Hives and Swelling    Patient's daughter reports patient had swelling/hives during hospital admission in [place]. They were on several medications at once so she is unsure whether it was VANCOMYCIN that caused this reaction or another concomitant medication.      REVIEW OF SYSTEMS  (Negative unless checked)  Constitutional: '[]'$ Weight loss  '[]'$ Fever  '[]'$ Chills Cardiac: '[]'$ Chest pain   '[]'$ Chest pressure   '[]'$ Palpitations   '[]'$ Shortness of breath when laying flat   '[]'$ Shortness of breath at rest   '[]'$ Shortness of breath with exertion. Vascular:  '[]'$ Pain in legs with walking   '[x]'$ Pain in legs at rest   '[x]'$ Pain in legs when laying flat   '[]'$ Claudication   '[x]'$ Pain in feet when walking  '[x]'$ Pain in feet at rest  '[x]'$ Pain in feet when laying flat   '[x]'$ History of DVT   '[]'$ Phlebitis   '[x]'$ Swelling in legs   '[]'$ Varicose veins   '[]'$ Non-healing ulcers Pulmonary:   '[]'$   Uses home oxygen   '[]'$ Productive cough   '[]'$ Hemoptysis   '[]'$ Wheeze  '[]'$ COPD   '[]'$ Asthma Neurologic:  '[]'$ Dizziness  '[]'$ Blackouts   '[]'$ Seizures   '[]'$ History of stroke   '[]'$ History of TIA  '[]'$ Aphasia   '[]'$ Temporary blindness   '[]'$ Dysphagia   '[]'$ Weakness or numbness in arms   '[]'$ Weakness or numbness in legs Musculoskeletal:  '[]'$ Arthritis   '[]'$ Joint swelling   '[]'$ Joint pain   '[]'$ Low back pain Hematologic:  '[]'$ Easy bruising  '[]'$ Easy bleeding   '[]'$ Hypercoagulable state   '[]'$ Anemic  '[]'$ Hepatitis Gastrointestinal:  '[]'$ Blood in stool   '[]'$ Vomiting blood  '[]'$ Gastroesophageal reflux/heartburn   '[]'$ Difficulty swallowing. Genitourinary:  '[]'$ Chronic kidney disease   '[]'$ Difficult urination  '[]'$ Frequent urination  '[]'$ Burning with urination   '[]'$ Blood in urine Skin:  '[]'$ Rashes   '[]'$ Ulcers   '[]'$ Wounds Psychological:  '[]'$ History of anxiety   '[]'$  History of major depression.  Physical Examination  Vitals:   10/25/22 1140 10/25/22 1145  BP:  108/81  Pulse:  75  Resp:  19  Temp:  97.8 F (36.6 C)  TempSrc:  Oral  SpO2:  97%  Height: '5\' 10"'$  (1.778 m)    Body mass index is 30.56 kg/m. Gen:  WD/WN, NAD Head: [place]/AT, No temporalis wasting. Prominent temp pulse not noted. Ear/Nose/Throat: Hearing grossly intact, nares w/o erythema or drainage, oropharynx w/o Erythema/Exudate Eyes: Sclera non-icteric, conjunctiva clear Neck: Trachea midline.  No JVD.  Pulmonary:  Good air movement, respirations  not labored, equal bilaterally.  Cardiac: RRR, normal S1, S2. Vascular: No palpable pulses to the right lower extremity.  Palpable pulses were found in the left lower extremity.  Right lower extremity is very tight and warm to the touch.  No cellulitic changes noted of the right lower extremity. Vessel Right Left                      Femoral None Palpable  Popliteal None Palpable  PT None Palpable  DP None Palpable   Gastrointestinal: soft, non-tender/non-distended. No guarding/reflex.  Musculoskeletal: M/S 5/5 throughout.  Extremities with ischemic changes.   Positive plus 4  edema to right lower extremity. Neurologic: Sensation grossly intact in extremities.  Symmetrical.  Speech is fluent. Motor exam as listed above. Psychiatric: Judgment intact, Mood & affect appropriate for pt's clinical situation. Dermatologic: No rashes or ulcers noted.  No cellulitis or open wounds. Lymph : No Cervical, Axillary, or Inguinal lymphadenopathy.    CBC Lab Results  Component Value Date   WBC 6.3 10/25/2022   HGB 11.2 (L) 10/25/2022   HCT 37.0 (L) 10/25/2022   MCV 96.6 10/25/2022   PLT 262 10/25/2022    BMET    Component Value Date/Time   NA 139 10/25/2022 1144   NA 143 10/14/2019 1424   K 4.3 10/25/2022 1144   CL 113 (H) 10/25/2022 1144   CO2 17 (L) 10/25/2022 1144   GLUCOSE 103 (H) 10/25/2022 1144   BUN 20 10/25/2022 1144   BUN 16 10/14/2019 1424   CREATININE 1.01 10/25/2022 1144   CALCIUM 9.1 10/25/2022 1144   GFRNONAA >60 10/25/2022 1144   GFRAA >60 05/03/2020 0604   CrCl cannot be calculated (Unknown ideal weight.).  COAG Lab Results  Component Value Date   INR 1.2 10/06/2022    Radiology US Venous Img Lower Unilateral Right  Result Date: 10/25/2022 CLINICAL DATA:  Pain and swelling EXAM: Right LOWER EXTREMITY VENOUS DOPPLER ULTRASOUND TECHNIQUE: Gray-scale sonography with compression, as well as color and duplex ultrasound, were performed to evaluate  the deep  venous system(s) from the level of the common femoral vein through the popliteal and proximal calf veins. COMPARISON:  None Available. FINDINGS: VENOUS There is evidence of occlusive deep venous thrombosis in right common femoral vein. Nonocclusive DVT is noted in the proximal femoral vein. Rest of the major deep veins appear patent. There is nonocclusive superficial phlebitis in greater saphenous vein. Limited views of the contralateral common femoral vein are unremarkable. OTHER None. Limitations: none IMPRESSION: Occlusive DVT is seen in right common femoral vein. Nonocclusive DVT is noted in proximal course of right femoral vein. Superficial phlebitis is noted in the right greater saphenous vein. Electronically Signed   By: Elmer Picker M.D.   On: 10/25/2022 13:33   DG Chest 2 View  Result Date: 10/25/2022 CLINICAL DATA:  Cough and shortness of breath.  Leg swelling. EXAM: CHEST - 2 VIEW COMPARISON:  06/11/2022 FINDINGS: Thoracic spine fixation, suboptimally evaluated. Upper thoracic vertebral corpectomy. IVC filter. Patient rotated to the right. Mild cardiomegaly. No pleural effusion or pneumothorax. Peripheral and basilar predominant interstitial thickening likely corresponds to interstitial lung disease on 10/06/2022 CT. No superimposed congestive failure or lobar consolidation. IMPRESSION: Interstitial lung disease, grossly similar to the prior CTA chest. Consider pulmonology consultation for eventual dedicated high-resolution chest CT. Cardiomegaly without superimposed congestive failure or acute process. Electronically Signed   By: Abigail Miyamoto M.D.   On: 10/25/2022 12:26   PERIPHERAL VASCULAR CATHETERIZATION  Result Date: 10/07/2022 See surgical [name] for result.  CT Angio Chest Pulmonary Embolism (PE) W or WO Contrast  Result Date: 10/06/2022 CLINICAL DATA:  Tachycardia, history of DVT in right lower extremity EXAM: CT ANGIOGRAPHY CHEST WITH CONTRAST TECHNIQUE: Multidetector CT  imaging of the chest was performed using the standard protocol during bolus administration of intravenous contrast. Multiplanar CT image reconstructions and MIPs were obtained to evaluate the vascular anatomy. RADIATION DOSE REDUCTION: This exam was performed according to the departmental dose-optimization program which includes automated exposure control, adjustment of the mA and/or kV according to patient size and/or use of iterative reconstruction technique. CONTRAST:  31m OMNIPAQUE IOHEXOL 350 MG/ML SOLN COMPARISON:  CT chest done on 06/11/2022 FINDINGS: Cardiovascular: There is homogeneous enhancement in thoracic aorta. Calcifications are seen in mitral annulus. There are no intraluminal filling defects in central pulmonary artery branches. Evaluation of small peripheral branches is limited by motion artifacts, especially in the lower lung fields. Mediastinum/Nodes: No significant lymphadenopathy is seen. Lungs/Pleura: Increased interstitial markings are seen in the periphery of both lungs, more so in the lower lung fields. There is no focal consolidation. There is slight improvement in aeration of lower lung fields. There is no pleural effusion or pneumothorax. Upper Abdomen: There is fatty infiltration in liver. There is subtle increased density in the dependent portion of gallbladder. There is no wall thickening in gallbladder. Small hiatal hernia is seen. Musculoskeletal: There is laminectomy and surgical fusion in the thoracic spine there is a metallic structure embedded in the bodies of T3, T4 and T5 vertebrae. Review of the MIP images confirms the above findings. IMPRESSION: There is no evidence of central pulmonary artery embolism. There is no evidence of thoracic aortic dissection. Increased interstitial markings are seen in both lungs, more so in the lower lung fields. 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 Possibility of superimposed interstitial pneumonia is not excluded.  There is no pleural effusion or pneumothorax. Small hiatal hernia. There is subtle increased density in the dependent portion of gallbladder suggesting tiny gallbladder stones or  sludge. Other findings as described in the body of the report. Electronically Signed   By: Elmer Picker M.D.   On: 10/06/2022 16:13   US Venous Img Lower Unilateral Right (DVT)  Result Date: 10/06/2022 CLINICAL DATA:  Right lower extremity edema EXAM: RIGHT LOWER EXTREMITY VENOUS DOPPLER ULTRASOUND TECHNIQUE: Gray-scale sonography with graded compression, as well as color Doppler and duplex ultrasound were performed to evaluate the lower extremity deep venous systems from the level of the common femoral vein and including the common femoral, femoral, profunda femoral, popliteal and calf veins including the posterior tibial, peroneal and gastrocnemius veins when visible. The superficial great saphenous vein was also interrogated. Spectral Doppler was utilized to evaluate flow at rest and with distal augmentation maneuvers in the common femoral, femoral and popliteal veins. COMPARISON:  None Available. FINDINGS: Contralateral Common Femoral Vein: Respiratory phasicity is normal and symmetric with the symptomatic side. No evidence of thrombus. Normal compressibility. Common Femoral Vein: The common femoral vein is not compressible. The vein is expanded and filled with low-level internal echoes. Findings are consistent with acute occlusive DVT. No evidence of color flow on color Doppler imaging. Saphenofemoral Junction: Occlusive DVT extends into the saphenofemoral junction. The remainder of the great saphenous vein is patent. Profunda Femoral Vein: Occlusive thrombus extends into the profunda femoral vein. Femoral Vein: Occlusive thrombus extends into the femoral vein throughout the thigh. Popliteal Vein: Occlusive thrombus extends into the popliteal vein. Calf Veins: Occlusive thrombus extends into the posterior tibial veins.  Superficial Great Saphenous Vein: No evidence of thrombus. Normal compressibility. Venous Reflux:  None. Other Findings:  None. IMPRESSION: Positive for extensive acute occlusive DVT throughout the right lower extremity from the common femoral vein into the calf. These results will be called to the ordering clinician or representative by the Radiologist Assistant, and communication documented in the PACS or [employer]. Electronically Signed   By: Jacqulynn Cadet M.D.   On: 10/06/2022 11:52      Assessment/Plan 1. PVD: Patient presents to the ER today with what appears to be a recurrent thrombus of his right lower extremity.  Patient's right leg is twice the size of his left leg.  Very edematous and his skin is very tight.  No signs of cellulitis or any infection.  No ulcers or open sores.  Plan is to take him back to the vascular lab sometime this week for an IVC gram with a right lower extremity venogram.  He most likely will need mechanical thrombectomy of the right lower extremity.  2. Continue antihypertensive medications as already ordered, these medications have been reviewed and there are no changes at this time.  3. Continue statin as ordered and reviewed, no changes at this time  Plan of care discussed with Dr [name] MD and he is in agreement with plan noted above.   Family Communication:  Total Time:75 I spent 75 minutes in this encounter including personally reviewing extensive medical records, personally reviewing imaging studies and compared to prior scans, counseling the patient, placing orders, coordinating care and performing appropriate documentation  Thank you for allowing [place] to participate in the care of this patient.   [name], NP [facility] [phone] (Office Phone) [phone] (Office Fax) [phone] (Pager)  10/25/2022 4:45 PM  Staff may message me via secure chat in [place]  but this may not receive immediate response,   please page for urgent matters!  Dictation software was used to generate the above [name]. Typos may  occur and escape review, as with typed/written notes. Any error is purely unintentional.  Please contact me directly for clarity if needed.

## 2022-10-25 NOTE — Consult Note (Addendum)
ANTICOAGULATION CONSULT NOTE - Consult  Pharmacy Consult for heparin gtt Indication: VTE treatment  Allergies  Allergen Reactions   Penicillins Rash    Beta-lactam allergy, DRESS Beta-lactam allergy, DRESS    Shrimp [Shellfish Allergy] Hives   Cisatracurium Other (See Comments)    Other reaction(s): Unknown Other reaction(s): Unknown    Heparin Hives and Swelling    Patient's daughter reports patient had swelling/hives during hospital admission in [place]. They were on several medications at once so she is unsure whether it was VANCOMYCIN that caused this reaction or another concomitant medication.    Rocuronium Other (See Comments)    Other reaction(s): Unknown Other reaction(s): Unknown    Vancomycin Hives and Swelling    Patient's daughter reports patient had swelling/hives during hospital admission in [place]. They were on several medications at once so she is unsure whether it was VANCOMYCIN that caused this reaction or another concomitant medication.     Patient Measurements: Height: '5\' 10"'$  (177.8 cm) IBW/kg (Calculated) : 73 Heparin Dosing Weight: 93.9 kg  Vital Signs: Temp: 97.8 F (36.6 C) (12/05 1145) Temp Source: Oral (12/05 1145) BP: 108/81 (12/05 1145) Pulse Rate: 75 (12/05 1145)  Labs: Recent Labs    10/25/22 1144  HGB 11.2*  HCT 37.0*  PLT 262  CREATININE 1.01    CrCl cannot be calculated (Unknown ideal weight.).  Medications:  Allergies: Patient's daughter reports patient had swelling/hives during hospital admission in [place]. They were on several medications at once so she is unsure whether it was  heparin that caused this reaction or another concomitant medication.    Assessment: Russell [name] is a 71 y.o. male w/ h/o HTN, HLD, arthritis, history of DVT (03/2022 was on Eliquis but stopped 04/2022 due to cost) whom was seen on 09/2022 with worsening of DVT. Pt discharged on Eliquis on 11/19, but now presenting with worsening of DVT RLE.   Of note, pt's  daughter reports possible heparin allergy resulting in hives, discussed alternative agents with ED, including bivalirudin. Of note, patient has tolerated multiple doses of enoxaparin (LMWH) in the past. On last admission, possibility of vascular intervention, led to starting bivalirudin over lovenox. Pharmacy has been consulted to initiate and manage bivalrudin infusion.   Baseline Labs: aPTT 30s, Hgb 12.7>11.2, Hct 39>37, Plt 162>262   Goal of Therapy:  aPTT 50-85 seconds Monitor platelets by anticoagulation protocol: Yes   Date    Time    aPTT   Rate (mg/h)/Comment  11/16   1929    29        (14.1); SUBtherapeutic 0.15>0.195 mg/k/h 11/17   0055    66        (18.3); therapeutic x1; 0.195 mg/k/h 11/17   0541    75        (18.3); therapeutic x2; 0.195 mg/k/h  ----- recently therapeutic on the above 12/5    Plan:  Pt recently admitted and on bivalirudin, last therapeutic at 18.'3mg'$ /h (0.'195mg'$ /k/h -- dosing weight of 93.9kg was used). Will resume at prior therapeutic rate of 18.3 mg/h Next aPTT level in 4hrs; then daily once consecutively therapeutic or more frequently if needed in critical illness. CTM daily CBC's  Lorna Dibble 10/25/2022,2:25 PM

## 2022-10-25 NOTE — Consult Note (Addendum)
[facility] Vascular Consult Note  [name] : [id number]  Russell [name] is a 71 y.o. (09/12/1951) male who presents with chief complaint of  Chief Complaint  Patient presents with   Cough   Leg Swelling  .   Consulting Physician:Dr. Lavonia Drafts MD Reason for consult:Right Leg Swelling and Cough History of Present Illness:   Russell [name] is a 72 y.o. male who presents with complaints of right leg swelling, he also reports he is having upper respiratory symptoms for about a week now.  Patient reports his legs have been swollen since he had  IVC venogram and right lower extremity venogram with mechanical thrombectomy.  Review of records demonstrates the patient had a mechanical thrombectomy on November 17 for extensive clot extending all the way from his popliteal vein to his IVC.   Today the patient presents to the emergency department with a swollen right leg approximately twice the size of his left leg.  Patient states is very tight and it burns.  Patient states is difficult to walk and is very painful.  He denies any fevers or chills.  Denies any open sores or cuts to the lower extremity.  He currently is using a cane to ambulate.  He currently states that he has been on Eliquis and is taking every day as required.  After looking into his chart the starter pack of Eliquis he was given expired on 10/15/2022.  I asked the patient if he had gotten his refill of his Eliquis and was taking it?  Patient states "I think so ".  Russell [name] has a history of not taking his Eliquis.  Current Facility-Administered Medications  Medication Dose Route Frequency Provider Last Rate Last Admin   acetaminophen (TYLENOL) tablet 650 mg  650 mg Oral Q6H PRN Ivor Costa, MD       albuterol (PROVENTIL) (2.5 MG/3ML) 0.083% nebulizer solution 2.5 mg  2.5 mg Nebulization Q4H PRN Alison Murray, RPH       bivalirudin (ANGIOMAX) 250 mg in sodium chloride 0.9 % 500 mL (0.5 mg/mL) infusion   0.195 mg/kg/hr (Order-Specific) Intravenous Continuous Beers, Shanon Brow, RPH       dextromethorphan-guaiFENesin (MUCINEX DM) 30-600 MG per 12 hr tablet 1 tablet  1 tablet Oral BID PRN Ivor Costa, MD       hydrALAZINE (APRESOLINE) injection 5 mg  5 mg Intravenous Q2H PRN Ivor Costa, MD       ondansetron [facility]) injection 4 mg  4 mg Intravenous Q8H PRN Ivor Costa, MD       oxyCODONE-acetaminophen (PERCOCET/ROXICET) 5-325 MG per tablet 1 tablet  1 tablet Oral Q4H PRN Ivor Costa, MD       Current Outpatient Medications  Medication Sig Dispense Refill   apixaban (ELIQUIS) 5 MG TABS tablet Take 2 tablets (10 mg total) by mouth 2 (two) times daily for 6 days. 24 tablet 0   apixaban (ELIQUIS) 5 MG TABS tablet Take 1 tablet (5 mg total) by mouth 2 (two) times daily. 60 tablet 0   gabapentin (NEURONTIN) 300 MG capsule Take 300 mg by mouth 3 (three) times daily.     HYDROcodone-acetaminophen (NORCO/VICODIN) 5-325 MG tablet Take 1 tablet by mouth every 6 (six) hours as needed for moderate pain. 12 tablet 0   lidocaine (XYLOCAINE) 5 % ointment Apply 1 Application topically 3 (three) times daily as needed for mild pain.     methocarbamol (ROBAXIN) 750 MG tablet Take 750 mg by mouth every 8 (eight) hours  as needed for muscle spasms.     ondansetron (ZOFRAN) 4 MG tablet Take 4 mg by mouth every 8 (eight) hours as needed.     ondansetron (ZOFRAN-ODT) 4 MG disintegrating tablet Take 1 tablet (4 mg total) by mouth every 8 (eight) hours as needed for nausea or vomiting. 10 tablet 0   triamcinolone lotion (KENALOG) 0.1 % Apply 1 Application topically 3 (three) times daily.      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   Hyperlipidemia    Hypertension     Past Surgical History:  Procedure Laterality Date   BACK SURGERY     COLONOSCOPY WITH PROPOFOL N/A 11/05/2019   Procedure: COLONOSCOPY WITH PROPOFOL;  Surgeon: [name], MD;  Location: [facility]  ENDOSCOPY;  Service: Gastroenterology;  Laterality: N/A;   PARTIAL COLECTOMY N/A 02/11/2020   Procedure: PARTIAL COLECTOMY -- sigmoid;  Surgeon: Olean Ree, MD;  Location: [facility] ORS;  Service: General;  Laterality: N/A;   PERIPHERAL VASCULAR THROMBECTOMY Right 10/07/2022   Procedure: PERIPHERAL VASCULAR THROMBECTOMY;  Surgeon: Algernon Huxley, MD;  Location: [facility] CV LAB;  Service: Cardiovascular;  Laterality: Right;   SPINE SURGERY     TAKE DOWN OF INTESTINAL FISTULA N/A 02/11/2020   Procedure: TAKE DOWN OF INTESTINAL FISTULA -- colovesical fistula;  Surgeon: Olean Ree, MD;  Location: [facility] ORS;  Service: General;  Laterality: N/A;   TRACHEAL SURGERY      Social History Social History   Tobacco Use   Smoking status: Never   Smokeless tobacco: Never  Vaping Use   Vaping Use: Never used  Substance Use Topics   Alcohol use: No   Drug use: No    Family History Family History  Problem Relation Age of Onset   Prostate cancer Neg Hx    Bladder Cancer Neg Hx    Kidney cancer Neg Hx     Allergies  Allergen Reactions   Penicillins Rash    Beta-lactam allergy, DRESS Beta-lactam allergy, DRESS    Shrimp [Shellfish Allergy] Hives   Cisatracurium Other (See Comments)    Other reaction(s): Unknown Other reaction(s): Unknown    Heparin Hives and Swelling    Patient's daughter reports patient had swelling/hives during hospital admission in [place]. They were on several medications at once so she is unsure whether it was VANCOMYCIN that caused this reaction or another concomitant medication.    Rocuronium Other (See Comments)    Other reaction(s): Unknown Other reaction(s): Unknown    Vancomycin Hives and Swelling    Patient's daughter reports patient had swelling/hives during hospital admission in [place]. They were on several medications at once so she is unsure whether it was VANCOMYCIN that caused this reaction or another concomitant medication.      REVIEW OF SYSTEMS  (Negative unless checked)  Constitutional: '[]'$ Weight loss  '[]'$ Fever  '[]'$ Chills Cardiac: '[]'$ Chest pain   '[]'$ Chest pressure   '[]'$ Palpitations   '[]'$ Shortness of breath when laying flat   '[]'$ Shortness of breath at rest   '[]'$ Shortness of breath with exertion. Vascular:  '[]'$ Pain in legs with walking   '[x]'$ Pain in legs at rest   '[x]'$ Pain in legs when laying flat   '[]'$ Claudication   '[x]'$ Pain in feet when walking  '[x]'$ Pain in feet at rest  '[x]'$ Pain in feet when laying flat   '[x]'$ History of DVT   '[]'$ Phlebitis   '[x]'$ Swelling in legs   '[]'$ Varicose veins   '[]'$ Non-healing ulcers Pulmonary:   '[]'$   Uses home oxygen   '[]'$ Productive cough   '[]'$ Hemoptysis   '[]'$ Wheeze  '[]'$ COPD   '[]'$ Asthma Neurologic:  '[]'$ Dizziness  '[]'$ Blackouts   '[]'$ Seizures   '[]'$ History of stroke   '[]'$ History of TIA  '[]'$ Aphasia   '[]'$ Temporary blindness   '[]'$ Dysphagia   '[]'$ Weakness or numbness in arms   '[]'$ Weakness or numbness in legs Musculoskeletal:  '[]'$ Arthritis   '[]'$ Joint swelling   '[]'$ Joint pain   '[]'$ Low back pain Hematologic:  '[]'$ Easy bruising  '[]'$ Easy bleeding   '[]'$ Hypercoagulable state   '[]'$ Anemic  '[]'$ Hepatitis Gastrointestinal:  '[]'$ Blood in stool   '[]'$ Vomiting blood  '[]'$ Gastroesophageal reflux/heartburn   '[]'$ Difficulty swallowing. Genitourinary:  '[]'$ Chronic kidney disease   '[]'$ Difficult urination  '[]'$ Frequent urination  '[]'$ Burning with urination   '[]'$ Blood in urine Skin:  '[]'$ Rashes   '[]'$ Ulcers   '[]'$ Wounds Psychological:  '[]'$ History of anxiety   '[]'$  History of major depression.  Physical Examination  Vitals:   10/25/22 1140 10/25/22 1145  BP:  108/81  Pulse:  75  Resp:  19  Temp:  97.8 F (36.6 C)  TempSrc:  Oral  SpO2:  97%  Height: '5\' 10"'$  (1.778 m)    Body mass index is 30.56 kg/m. Gen:  WD/WN, NAD Head: [place]/AT, No temporalis wasting. Prominent temp pulse not noted. Ear/Nose/Throat: Hearing grossly intact, nares w/o erythema or drainage, oropharynx w/o Erythema/Exudate Eyes: Sclera non-icteric, conjunctiva clear Neck: Trachea midline.  No JVD.  Pulmonary:  Good air movement, respirations  not labored, equal bilaterally.  Cardiac: RRR, normal S1, S2. Vascular: No palpable pulses to the right lower extremity.  Palpable pulses were found in the left lower extremity.  Right lower extremity is very tight and warm to the touch.  No cellulitic changes noted of the right lower extremity. Vessel Right Left                      Femoral None Palpable  Popliteal None Palpable  PT None Palpable  DP None Palpable   Gastrointestinal: soft, non-tender/non-distended. No guarding/reflex.  Musculoskeletal: M/S 5/5 throughout.  Extremities with ischemic changes.   Positive plus 4  edema to right lower extremity. Neurologic: Sensation grossly intact in extremities.  Symmetrical.  Speech is fluent. Motor exam as listed above. Psychiatric: Judgment intact, Mood & affect appropriate for pt's clinical situation. Dermatologic: No rashes or ulcers noted.  No cellulitis or open wounds. Lymph : No Cervical, Axillary, or Inguinal lymphadenopathy.    CBC Lab Results  Component Value Date   WBC 6.3 10/25/2022   HGB 11.2 (L) 10/25/2022   HCT 37.0 (L) 10/25/2022   MCV 96.6 10/25/2022   PLT 262 10/25/2022    BMET    Component Value Date/Time   NA 139 10/25/2022 1144   NA 143 10/14/2019 1424   K 4.3 10/25/2022 1144   CL 113 (H) 10/25/2022 1144   CO2 17 (L) 10/25/2022 1144   GLUCOSE 103 (H) 10/25/2022 1144   BUN 20 10/25/2022 1144   BUN 16 10/14/2019 1424   CREATININE 1.01 10/25/2022 1144   CALCIUM 9.1 10/25/2022 1144   GFRNONAA >60 10/25/2022 1144   GFRAA >60 05/03/2020 0604   CrCl cannot be calculated (Unknown ideal weight.).  COAG Lab Results  Component Value Date   INR 1.2 10/06/2022    Radiology US Venous Img Lower Unilateral Right  Result Date: 10/25/2022 CLINICAL DATA:  Pain and swelling EXAM: Right LOWER EXTREMITY VENOUS DOPPLER ULTRASOUND TECHNIQUE: Gray-scale sonography with compression, as well as color and duplex ultrasound, were performed to evaluate  the deep  venous system(s) from the level of the common femoral vein through the popliteal and proximal calf veins. COMPARISON:  None Available. FINDINGS: VENOUS There is evidence of occlusive deep venous thrombosis in right common femoral vein. Nonocclusive DVT is noted in the proximal femoral vein. Rest of the major deep veins appear patent. There is nonocclusive superficial phlebitis in greater saphenous vein. Limited views of the contralateral common femoral vein are unremarkable. OTHER None. Limitations: none IMPRESSION: Occlusive DVT is seen in right common femoral vein. Nonocclusive DVT is noted in proximal course of right femoral vein. Superficial phlebitis is noted in the right greater saphenous vein. Electronically Signed   By: Elmer Picker M.D.   On: 10/25/2022 13:33   DG Chest 2 View  Result Date: 10/25/2022 CLINICAL DATA:  Cough and shortness of breath.  Leg swelling. EXAM: CHEST - 2 VIEW COMPARISON:  06/11/2022 FINDINGS: Thoracic spine fixation, suboptimally evaluated. Upper thoracic vertebral corpectomy. IVC filter. Patient rotated to the right. Mild cardiomegaly. No pleural effusion or pneumothorax. Peripheral and basilar predominant interstitial thickening likely corresponds to interstitial lung disease on 10/06/2022 CT. No superimposed congestive failure or lobar consolidation. IMPRESSION: Interstitial lung disease, grossly similar to the prior CTA chest. Consider pulmonology consultation for eventual dedicated high-resolution chest CT. Cardiomegaly without superimposed congestive failure or acute process. Electronically Signed   By: Abigail Miyamoto M.D.   On: 10/25/2022 12:26   PERIPHERAL VASCULAR CATHETERIZATION  Result Date: 10/07/2022 See surgical note for result.  CT Angio Chest Pulmonary Embolism (PE) W or WO Contrast  Result Date: 10/06/2022 CLINICAL DATA:  Tachycardia, history of DVT in right lower extremity EXAM: CT ANGIOGRAPHY CHEST WITH CONTRAST TECHNIQUE: Multidetector CT  imaging of the chest was performed using the standard protocol during bolus administration of intravenous contrast. Multiplanar CT image reconstructions and MIPs were obtained to evaluate the vascular anatomy. RADIATION DOSE REDUCTION: This exam was performed according to the departmental dose-optimization program which includes automated exposure control, adjustment of the mA and/or kV according to patient size and/or use of iterative reconstruction technique. CONTRAST:  42m OMNIPAQUE IOHEXOL 350 MG/ML SOLN COMPARISON:  CT chest done on 06/11/2022 FINDINGS: Cardiovascular: There is homogeneous enhancement in thoracic aorta. Calcifications are seen in mitral annulus. There are no intraluminal filling defects in central pulmonary artery branches. Evaluation of small peripheral branches is limited by motion artifacts, especially in the lower lung fields. Mediastinum/Nodes: No significant lymphadenopathy is seen. Lungs/Pleura: Increased interstitial markings are seen in the periphery of both lungs, more so in the lower lung fields. There is no focal consolidation. There is slight improvement in aeration of lower lung fields. There is no pleural effusion or pneumothorax. Upper Abdomen: There is fatty infiltration in liver. There is subtle increased density in the dependent portion of gallbladder. There is no wall thickening in gallbladder. Small hiatal hernia is seen. Musculoskeletal: There is laminectomy and surgical fusion in the thoracic spine there is a metallic structure embedded in the bodies of T3, T4 and T5 vertebrae. Review of the MIP images confirms the above findings. IMPRESSION: There is no evidence of central pulmonary artery embolism. There is no evidence of thoracic aortic dissection. Increased interstitial markings are seen in both lungs, more so in the lower lung fields. 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 Possibility of superimposed interstitial pneumonia is not excluded.  There is no pleural effusion or pneumothorax. Small hiatal hernia. There is subtle increased density in the dependent portion of gallbladder suggesting tiny gallbladder stones or  sludge. Other findings as described in the body of the report. Electronically Signed   By: Elmer Picker M.D.   On: 10/06/2022 16:13   US Venous Img Lower Unilateral Right (DVT)  Result Date: 10/06/2022 CLINICAL DATA:  Right lower extremity edema EXAM: RIGHT LOWER EXTREMITY VENOUS DOPPLER ULTRASOUND TECHNIQUE: Gray-scale sonography with graded compression, as well as color Doppler and duplex ultrasound were performed to evaluate the lower extremity deep venous systems from the level of the common femoral vein and including the common femoral, femoral, profunda femoral, popliteal and calf veins including the posterior tibial, peroneal and gastrocnemius veins when visible. The superficial great saphenous vein was also interrogated. Spectral Doppler was utilized to evaluate flow at rest and with distal augmentation maneuvers in the common femoral, femoral and popliteal veins. COMPARISON:  None Available. FINDINGS: Contralateral Common Femoral Vein: Respiratory phasicity is normal and symmetric with the symptomatic side. No evidence of thrombus. Normal compressibility. Common Femoral Vein: The common femoral vein is not compressible. The vein is expanded and filled with low-level internal echoes. Findings are consistent with acute occlusive DVT. No evidence of color flow on color Doppler imaging. Saphenofemoral Junction: Occlusive DVT extends into the saphenofemoral junction. The remainder of the great saphenous vein is patent. Profunda Femoral Vein: Occlusive thrombus extends into the profunda femoral vein. Femoral Vein: Occlusive thrombus extends into the femoral vein throughout the thigh. Popliteal Vein: Occlusive thrombus extends into the popliteal vein. Calf Veins: Occlusive thrombus extends into the posterior tibial veins.  Superficial Great Saphenous Vein: No evidence of thrombus. Normal compressibility. Venous Reflux:  None. Other Findings:  None. IMPRESSION: Positive for extensive acute occlusive DVT throughout the right lower extremity from the common femoral vein into the calf. These results will be called to the ordering clinician or representative by the Radiologist Assistant, and communication documented in the PACS or [employer]. Electronically Signed   By: Jacqulynn Cadet M.D.   On: 10/06/2022 11:52      Assessment/Plan 1. Deep Vein Thrombosis: Patient presents to the ER today with what appears to be a recurrent thrombus of his right lower extremity. Duplex today confirms this.  Patient's right leg is twice the size of his left leg.  Very edematous and his skin is very tight.  No signs of cellulitis or any infection.  No ulcers or open sores.  Plan is to take him back to the vascular lab sometime this week for an IVC gram with a right lower extremity venogram.  He most likely will need mechanical thrombectomy of the right lower extremity.  2. Hypertension: Continue antihypertensive medications as already ordered, these medications have been reviewed and there are no changes at this time.  3. Hyperlipidemia: Continue statin as ordered and reviewed, no changes at this time  Plan of care discussed with Dr [name] MD and he is in agreement with plan noted above.   Family Communication:  Total Time:75 I spent 75 minutes in this encounter including personally reviewing extensive medical records, personally reviewing imaging studies and compared to prior scans, counseling the patient, placing orders, coordinating care and performing appropriate documentation  Thank you for allowing [place] to participate in the care of this patient.   [name], NP [facility] [phone] (Office Phone) [phone] (Office Fax) [phone] (Pager)  10/25/2022 4:45 PM  Staff may  message me via secure chat in [place]  but this may not receive immediate response,  please page for urgent matters!  Dictation software was  used to generate the above note. Typos may occur and escape review, as with typed/written notes. Any error is purely unintentional.  Please contact me directly for clarity if needed.

## 2022-10-25 NOTE — ED Triage Notes (Signed)
Says he has cold/cough for 2 weeks.  Also says right side leg swelling is worse.

## 2022-10-25 NOTE — Telephone Encounter (Signed)
It appears he is currently in the ED

## 2022-10-25 NOTE — ED Provider Triage Note (Signed)
Emergency Medicine Provider Triage Evaluation Note  Russell [name] , a 71 y.o. male  was evaluated in triage.  Pt complains of right  leg swelling.  Cough and congestion  Review of Systems  Positive:  Negative:   Physical Exam  There were no vitals taken for this visit. Gen:   Awake, no distress   Resp:  Normal effort  MSK:   Moves extremities without difficulty large amount of swelling noted to the right leg, much larger than the left Other:    Medical Decision Making  Medically screening exam initiated at 11:40 AM.  Appropriate orders placed.  Russell [name] was informed that the remainder of the evaluation will be completed by another provider, this initial triage assessment does not replace that evaluation, and the importance of remaining in the ED until their evaluation is complete.     Versie Starks, PA-C 10/25/22 1140

## 2022-10-25 NOTE — ED Notes (Signed)
Angiomax continued as pt transported to floor with Cristie Hem RN now.

## 2022-10-25 NOTE — ED Notes (Signed)
Pt called out stating IV is burning. Wants IV removed. IV team consulted for new placement.

## 2022-10-25 NOTE — H&P (Signed)
History and Physical    Russell [name] DGU:[id number] DOB: 23-Oct-1951 DOA: 10/25/2022  Referring MD/NP/PA:   PCP: Norm Parcel, MD   Patient coming from:  The patient is coming from home.  At baseline, pt is independent for most of ADL.        Chief Complaint: right leg pain and swelling  HPI: Russell [name] is a 71 y.o. male with medical history significant of PE and DVT, HTN, HLD, anemia, ILD, who present with right leg pain and swelling.   Patient speaks Spanish and some [place].  I interviewed patient with the help of iPad interpreter.  Pt has hx of left leg DVT in May 2023. Due to noncompliance of taking Eliquis, he developed right leg DVT.  Patient was hospitalized from 11/16 - 11/19.  Patient is s/p of thrombectomy for right leg DVT.  He states that he is taking his blood thinner consistently.  In the past several days, his right leg swelling has been worsening.  He has erythema and pain in right leg, which is constant, moderate to severe, aching, nonradiating.  Patient denies chest pain, cough, fever or chills.  No nausea vomiting, diarrhea or abdominal pain.  No symptoms of UTI.  Patient does not have recent fall or head injury.  No active bleeding.  No dark stool or rectal bleeding.  Data reviewed independently and ED Course: pt was found to have WBC 6.3, negative COVID PCR, GFR> 60, temperature normal, blood pressure 108/81, heart rate 75, RR 19, oxygen saturation 97% on room air.  Chest x-ray showed cardiomegaly and interstitial lung disease.  Right lower extremity venous Doppler showed DVT.  Patient is admitted to telemetry bed as inpatient.  Dr. Delana Meyer of vascular surgery is consulted.  LE doppler of right leg: Occlusive DVT is seen in right common femoral vein. Nonocclusive DVT is noted in proximal course of right femoral vein. Superficial phlebitis is noted in the right greater saphenous vein.  EKG: I have personally reviewed.  Sinus rhythm, QTc 428, LAD, poor R wave  progression, low voltage, artificial effects   Review of Systems:   General: no fevers, chills, no body weight gain, fatigue HEENT: no blurry vision, hearing changes or sore throat Respiratory: no dyspnea, coughing, wheezing CV: no chest pain, no palpitations GI: no nausea, vomiting, abdominal pain, diarrhea, constipation GU: no dysuria, burning on urination, increased urinary frequency, hematuria  Ext: has right leg swelling and pain Neuro: no unilateral weakness, numbness, or tingling, no vision change or hearing loss Skin: no rash, no skin tear. MSK: No muscle spasm, no deformity, no limitation of range of movement in spin Heme: No easy bruising.  Travel history: No recent long distant travel.   Allergy:  Allergies  Allergen Reactions   Penicillins Rash    Beta-lactam allergy, DRESS Beta-lactam allergy, DRESS    Shrimp [Shellfish Allergy] Hives   Cisatracurium Other (See Comments)    Other reaction(s): Unknown Other reaction(s): Unknown    Heparin Hives and Swelling    Patient's daughter reports patient had swelling/hives during hospital admission in [place]. They were on several medications at once so she is unsure whether it was VANCOMYCIN that caused this reaction or another concomitant medication.    Rocuronium Other (See Comments)    Other reaction(s): Unknown Other reaction(s): Unknown    Vancomycin Hives and Swelling    Patient's daughter reports patient had swelling/hives during hospital admission in [place]. They were on several medications at once so she is unsure whether it  was VANCOMYCIN that caused this reaction or another concomitant medication.     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   Hyperlipidemia    Hypertension     Past Surgical History:  Procedure Laterality Date   BACK SURGERY     COLONOSCOPY WITH PROPOFOL N/A 11/05/2019   Procedure: COLONOSCOPY WITH PROPOFOL;  Surgeon: [name],  MD;  Location[facility] END[facility];  Service: Gastroenterology;  Laterality: N/A;   PARTIAL COLECTOMY N/A 02/11/2020   Procedure: PARTIAL COLECTOMY -- sigmoid;  Surgeon: Olean Ree, MD;  Location: [facility] ORS;  Service: General;  Laterality: N/A;   PERIPHERAL VASCULAR THROMBECTOMY Right 10/07/2022   Procedure: PERIPHERAL VASCULAR THROMBECTOMY;  Surgeon: Algernon Huxley, MD;  Location: [place] [facility];  Service: Cardiovascular;  Laterality: Right;   SPINE SURGERY     TAKE DOWN OF INTESTINAL FISTULA N/A 02/11/2020   Procedure: TAKE DOWN OF INTESTINAL FISTULA -- colovesical fistula;  Surgeon: Olean Ree, MD;  Location: [facility] ORS;  Service: General;  Laterality: N/A;   TRACHEAL SURGERY      Social History:  reports that he has never smoked. He has never used smokeless tobacco. He reports that he does not drink alcohol and does not use drugs.  Family History:  Family History  Problem Relation Age of Onset   Prostate cancer Neg Hx    Bladder Cancer Neg Hx    Kidney cancer Neg Hx      Prior to Admission medications   Medication Sig Start Date End Date Taking? Authorizing Provider  apixaban (ELIQUIS) 5 MG TABS tablet Take 2 tablets (10 mg total) by mouth 2 (two) times daily for 6 days. 10/09/22 10/15/22  Jennye Boroughs, MD  apixaban (ELIQUIS) 5 MG TABS tablet Take 1 tablet (5 mg total) by mouth 2 (two) times daily. 10/15/22   Jennye Boroughs, MD  gabapentin (NEURONTIN) 300 MG capsule Take 300 mg by mouth 3 (three) times daily.    [provider]  HYDROcodone-acetaminophen (NORCO/VICODIN) 5-325 MG tablet Take 1 tablet by mouth every 6 (six) hours as needed for moderate pain. 09/22/22 09/22/23  Johnn Hai, PA-C  lidocaine (XYLOCAINE) 5 % ointment Apply 1 Application topically 3 (three) times daily as needed for mild pain. 09/28/22   [provider]  methocarbamol (ROBAXIN) 750 MG tablet Take 750 mg by mouth every 8 (eight) hours as needed for muscle spasms.    [provider]  ondansetron (ZOFRAN) 4 MG tablet Take 4 mg by mouth every 8 (eight) hours as needed. 09/28/22   [provider]  ondansetron (ZOFRAN-ODT) 4 MG disintegrating tablet Take 1 tablet (4 mg total) by mouth every 8 (eight) hours as needed for nausea or vomiting. 06/11/22   Duffy Bruce, MD  triamcinolone lotion (KENALOG) 0.1 % Apply 1 Application topically 3 (three) times daily.    [provider]    Physical Exam: Vitals:   10/25/22 1140 10/25/22 1145  BP:  108/81  Pulse:  75  Resp:  19  Temp:  97.8 F (36.6 C)  TempSrc:  Oral  SpO2:  97%  Height: '5\' 10"'$  (1.778 m)    General: Not in acute distress HEENT:       Eyes: PERRL, EOMI, no scleral icterus.       ENT: No discharge from the ears and nose, no pharynx injection, no tonsillar enlargement.        Neck: No JVD, no  bruit, no mass felt. Heme: No neck lymph node enlargement. Cardiac: S1/S2, RRR, No murmurs, No gallops or rubs. Respiratory: No rales, wheezing, rhonchi or rubs. GI: Soft, nondistended, nontender, no rebound pain, no organomegaly, BS present. GU: No hematuria Ext: Has erythema, tenderness, swelling, warmth in the whole right leg Musculoskeletal: No joint deformities, No joint redness or warmth, no limitation of ROM in spin. Skin: No rashes.  Neuro: Alert, oriented X3, cranial nerves II-XII grossly intact, moves all extremities normally. Psych: Patient is not psychotic, no suicidal or hemocidal ideation.  Labs on Admission: I have personally reviewed following labs and imaging studies  CBC: Recent Labs  Lab 10/25/22 1144  WBC 6.3  NEUTROABS 4.0  HGB 11.2*  HCT 37.0*  MCV 96.6  PLT 149   Basic Metabolic Panel: Recent Labs  Lab 10/25/22 1144  NA 139  K 4.3  CL 113*  CO2 17*  GLUCOSE 103*  BUN 20  CREATININE 1.01  CALCIUM 9.1   GFR: CrCl cannot be calculated (Unknown ideal weight.). Liver Function Tests: Recent Labs  Lab 10/25/22 1144  AST 14*  ALT 20  ALKPHOS  75  BILITOT 1.1  PROT 7.2  ALBUMIN 4.0   No results for input(s): "LIPASE", "AMYLASE" in the last 168 hours. No results for input(s): "AMMONIA" in the last 168 hours. Coagulation Profile: No results for input(s): "INR", "PROTIME" in the last 168 hours. Cardiac Enzymes: No results for input(s): "CKTOTAL", "CKMB", "CKMBINDEX", "TROPONINI" in the last 168 hours. BNP (last 3 results) No results for input(s): "PROBNP" in the last 8760 hours. HbA1C: No results for input(s): "HGBA1C" in the last 72 hours. CBG: No results for input(s): "GLUCAP" in the last 168 hours. Lipid Profile: No results for input(s): "CHOL", "HDL", "LDLCALC", "TRIG", "CHOLHDL", "LDLDIRECT" in the last 72 hours. Thyroid Function Tests: No results for input(s): "TSH", "T4TOTAL", "FREET4", "T3FREE", "THYROIDAB" in the last 72 hours. Anemia Panel: No results for input(s): "VITAMINB12", "FOLATE", "FERRITIN", "TIBC", "IRON", "RETICCTPCT" in the last 72 hours. Urine analysis:    Component Value Date/Time   COLORURINE YELLOW (A) 09/22/2022 1154   APPEARANCEUR CLEAR (A) 09/22/2022 1154   APPEARANCEUR Hazy (A) 03/10/2020 1151   LABSPEC 1.006 09/22/2022 1154   PHURINE 5.0 09/22/2022 1154   GLUCOSEU NEGATIVE 09/22/2022 1154   HGBUR SMALL (A) 09/22/2022 1154   BILIRUBINUR NEGATIVE 09/22/2022 1154   BILIRUBINUR Negative 03/10/2020 1151   KETONESUR NEGATIVE 09/22/2022 1154   PROTEINUR NEGATIVE 09/22/2022 1154   NITRITE NEGATIVE 09/22/2022 1154   LEUKOCYTESUR NEGATIVE 09/22/2022 1154   Sepsis Labs: '@LABRCNTIP'$ (procalcitonin:4,lacticidven:4) ) Recent Results (from the past 240 hour(s))  Resp Panel by RT-PCR (Flu A&B, Covid) Anterior Nasal Swab     Status: None   Collection Time: 10/25/22 11:44 AM   Specimen: Anterior Nasal Swab  Result Value Ref Range Status   SARS Coronavirus 2 by RT PCR NEGATIVE NEGATIVE Final    Comment: (NOTE) SARS-CoV-2 target nucleic acids are NOT DETECTED.  The SARS-CoV-2 RNA is generally  detectable in upper respiratory specimens during the acute phase of infection. The lowest concentration of SARS-CoV-2 viral copies this assay can detect is 138 copies/mL. A negative result does not preclude SARS-Cov-2 infection and should not be used as the sole basis for treatment or other patient management decisions. A negative result may occur with  improper specimen collection/handling, submission of specimen other than nasopharyngeal swab, presence of viral mutation(s) within the areas targeted by this assay, and inadequate number of viral copies(<138 copies/mL). A negative result must  be combined with clinical observations, patient history, and epidemiological information. The expected result is Negative.  Fact Sheet for Patients:  EntrepreneurPulse.com.au  Fact Sheet for Healthcare Providers:  IncredibleEmployment.be  This test is no t yet approved or cleared by the [place] FDA and  has been authorized for detection and/or diagnosis of SARS-CoV-2 by FDA under an Emergency Use Authorization (EUA). This EUA will remain  in effect (meaning this test can be used) for the duration of the COVID-19 declaration under Section 564(b)(1) of the Act, 21 U.S.C.section 360bbb-3(b)(1), unless the authorization is terminated  or revoked sooner.       Influenza A by PCR NEGATIVE NEGATIVE Final   Influenza B by PCR NEGATIVE NEGATIVE Final    Comment: (NOTE) The Xpert Xpress SARS-CoV-2/FLU/RSV plus assay is intended as an aid in the diagnosis of influenza from Nasopharyngeal swab specimens and should not be used as a sole basis for treatment. Nasal washings and aspirates are unacceptable for Xpert Xpress SARS-CoV-2/FLU/RSV testing.  Fact Sheet for Patients: EntrepreneurPulse.com.au  Fact Sheet for Healthcare Providers: IncredibleEmployment.be  This test is not yet approved or cleared by the [place] FDA  and has been authorized for detection and/or diagnosis of SARS-CoV-2 by FDA under an Emergency Use Authorization (EUA). This EUA will remain in effect (meaning this test can be used) for the duration of the COVID-19 declaration under Section 564(b)(1) of the Act, 21 U.S.C. section 360bbb-3(b)(1), unless the authorization is terminated or revoked.  Performed at [facility], [address], [place], [place] [address]      Radiological Exams on Admission: US Venous Img Lower Unilateral Right  Result Date: 10/25/2022 CLINICAL DATA:  Pain and swelling EXAM: Right LOWER EXTREMITY VENOUS DOPPLER ULTRASOUND TECHNIQUE: Gray-scale sonography with compression, as well as color and duplex ultrasound, were performed to evaluate the deep venous system(s) from the level of the common femoral vein through the popliteal and proximal calf veins. COMPARISON:  None Available. FINDINGS: VENOUS There is evidence of occlusive deep venous thrombosis in right common femoral vein. Nonocclusive DVT is noted in the proximal femoral vein. Rest of the major deep veins appear patent. There is nonocclusive superficial phlebitis in greater saphenous vein. Limited views of the contralateral common femoral vein are unremarkable. OTHER None. Limitations: none IMPRESSION: Occlusive DVT is seen in right common femoral vein. Nonocclusive DVT is noted in proximal course of right femoral vein. Superficial phlebitis is noted in the right greater saphenous vein. Electronically Signed   By: Elmer Picker M.D.   On: 10/25/2022 13:33   DG Chest 2 View  Result Date: 10/25/2022 CLINICAL DATA:  Cough and shortness of breath.  Leg swelling. EXAM: CHEST - 2 VIEW COMPARISON:  06/11/2022 FINDINGS: Thoracic spine fixation, suboptimally evaluated. Upper thoracic vertebral corpectomy. IVC filter. Patient rotated to the right. Mild cardiomegaly. No pleural effusion or pneumothorax. Peripheral and basilar predominant interstitial  thickening likely corresponds to interstitial lung disease on 10/06/2022 CT. No superimposed congestive failure or lobar consolidation. IMPRESSION: Interstitial lung disease, grossly similar to the prior CTA chest. Consider pulmonology consultation for eventual dedicated high-resolution chest CT. Cardiomegaly without superimposed congestive failure or acute process. Electronically Signed   By: Abigail Miyamoto M.D.   On: 10/25/2022 12:26      Assessment/Plan Active Problems:   DVT, recurrent, lower extremity, acute, right (HCC)   History of pulmonary embolism   ILD (interstitial lung disease) (HCC)   Normocytic anemia   HTN (hypertension)   Dyslipidemia   Assessment and  Plan:  DVT, recurrent, lower extremity, acute, right ([place]): -admit to tele bed as inpt -start IV Angiomax drip -Consulted Dr. Delana Meyer of vascular surgery, possible thrombectomy again -As needed Percocet, Tylenol for pain  History of pulmonary embolism: CTA on 11/16 negative for PE -On Angiomax drip now  ILD (interstitial lung disease) ([place]): Denies shortness breath -As needed albuterol and Mucinex  Normocytic anemia: Hemoglobin stable 11.2 -Follow-up with CBC  HTN (hypertension): Patient does not have medication list -IV hydralazine as needed  Dyslipidemia: Patient is not taking medications currently -Follow-up with PCP      DVT ppx: On Angiomax drip  Code Status: Full code  Family Communication: not done, no family member is at bed side.     Disposition Plan:  Anticipate discharge back to previous environment  Consults called: Dr. Delana Meyer of vascular surgery  Admission status and Level of care: Telemetry Medical:    as inpt       Dispo: The patient is from: Home              Anticipated d/c is to: Home              Anticipated d/c date is: 2 days              Patient currently is not medically stable to d/c.    Severity of Illness:  The appropriate patient status for this patient is  INPATIENT. Inpatient status is judged to be reasonable and necessary in order to provide the required intensity of service to ensure the patient's safety. The patient's presenting symptoms, physical exam findings, and initial radiographic and laboratory data in the context of their chronic comorbidities is felt to place them at high risk for further clinical deterioration. Furthermore, it is not anticipated that the patient will be medically stable for discharge from the hospital within 2 midnights of admission.   * I certify that at the point of admission it is my clinical judgment that the patient will require inpatient hospital care spanning beyond 2 midnights from the point of admission due to high intensity of service, high risk for further deterioration and high frequency of surveillance required.*       Date of Service 10/25/2022    Ivor Costa [facility]   If 7PM-7AM, please contact night-coverage [email or web address] 10/25/2022, 3:39 PM

## 2022-10-25 NOTE — ED Provider Notes (Signed)
[facility] Provider Note    Event Date/Time   First MD Initiated Contact with Patient 10/25/22 1429     (approximate)   History   Cough and Leg Swelling  Spanish interpreter used. HPI  Russell [name] is a 71 y.o. male who presents with complaints of right leg swelling, he also reports he is having upper respiratory symptoms for about a week now.  Patient reports his legs been swollen since he had surgery.  Review of records demonstrates the patient had a mechanical thrombectomy on November 17 for extensive clot extending all the way from his popliteal vein to his IVC.     Physical Exam   Triage Vital Signs: ED Triage Vitals  Enc Vitals Group     BP 10/25/22 1145 108/81     Pulse Rate 10/25/22 1145 75     Resp 10/25/22 1145 19     Temp 10/25/22 1145 97.8 F (36.6 C)     Temp Source 10/25/22 1145 Oral     SpO2 10/25/22 1145 97 %     Weight --      Height 10/25/22 1140 1.778 m ('5\' 10"'$ )     Head Circumference --      Peak Flow --      Pain Score 10/25/22 1140 0     Pain Loc --      Pain Edu? --      Excl. in [place]? --     Most recent vital signs: Vitals:   10/25/22 1145  BP: 108/81  Pulse: 75  Resp: 19  Temp: 97.8 F (36.6 C)  SpO2: 97%     General: Awake, no distress.  CV:  Good peripheral perfusion.  Resp:  Normal effort.  Abd:  No distention.  Other:  Right leg: Diffuse swelling noted, warm and well-perfused   ED Results / Procedures / Treatments   Labs (all labs ordered are listed, but only abnormal results are displayed) Labs Reviewed  COMPREHENSIVE METABOLIC PANEL - Abnormal; Notable for the following components:      Result Value   Chloride 113 (*)    CO2 17 (*)    Glucose, Bld 103 (*)    AST 14 (*)    All other components within normal limits  CBC WITH DIFFERENTIAL/PLATELET - Abnormal; Notable for the following components:   RBC 3.83 (*)    Hemoglobin 11.2 (*)    HCT 37.0 (*)    All other components within normal  limits  RESP PANEL BY RT-PCR (FLU A&B, COVID) ARPGX2  BRAIN NATRIURETIC PEPTIDE  URINALYSIS, ROUTINE W REFLEX MICROSCOPIC  APTT  APTT     EKG  ED ECG REPORT I, Lavonia Drafts, the attending physician, personally viewed and interpreted this ECG.  Date: 10/25/2022  Rhythm: normal sinus rhythm QRS Axis: normal Intervals: normal ST/T Wave abnormalities: Nonspecific changes Narrative Interpretation: no evidence of acute ischemia    RADIOLOGY Chest x-ray viewed interpreted by me, no pneumonia    PROCEDURES:  Critical Care performed: yes  CRITICAL CARE Performed by: Lavonia Drafts   Total critical care time: 30 minutes  Critical care time was exclusive of separately billable procedures and treating other patients.  Critical care was necessary to treat or prevent imminent or life-threatening deterioration.  Critical care was time spent personally by me on the following activities: development of treatment plan with patient and/or surrogate as well as nursing, discussions with consultants, evaluation of patient's response to treatment, examination of patient, obtaining history from  patient or surrogate, ordering and performing treatments and interventions, ordering and review of laboratory studies, ordering and review of radiographic studies, pulse oximetry and re-evaluation of patient's condition.   Procedures   MEDICATIONS ORDERED IN ED: Medications  oxyCODONE-acetaminophen (PERCOCET/ROXICET) 5-325 MG per tablet 1 tablet (has no administration in time range)  acetaminophen (TYLENOL) tablet 650 mg (has no administration in time range)  hydrALAZINE (APRESOLINE) injection 5 mg (has no administration in time range)  ondansetron (ZOFRAN) injection 4 mg (has no administration in time range)  dextromethorphan-guaiFENesin (MUCINEX DM) 30-600 MG per 12 hr tablet 1 tablet (has no administration in time range)  albuterol (PROVENTIL) (2.5 MG/3ML) 0.083% nebulizer solution 2.5 mg  (has no administration in time range)  bivalirudin (ANGIOMAX) 250 mg in sodium chloride 0.9 % 500 mL (0.5 mg/mL) infusion (has no administration in time range)     IMPRESSION / MDM / ASSESSMENT AND PLAN / ED COURSE  I reviewed the triage vital signs and the nursing notes. Patient's presentation is most consistent with acute presentation with potential threat to life or bodily function.   Patient presents with right leg swelling as noted above.  Has a history of a DVT, has been on Eliquis, has had mechanical thrombectomy.  Still with extensive swelling noted.  He reports is not improved since procedure.  Work reviewed and is overall reassuring, ultrasound demonstrates occlusive thrombus  Discussed with Dr. Delana Meyer of vascular surgery who recommends heparin, admission for repeat mechanical thrombectomy  I have discussed with hospitalist for admission       FINAL CLINICAL IMPRESSION(S) / ED DIAGNOSES   Final diagnoses:  Deep vein thrombosis (DVT) of femoral vein of right lower extremity, unspecified chronicity ([place])     Rx / DC Orders   ED Discharge Orders     None        Note:  This document was prepared using Dragon voice recognition software and may include unintentional dictation errors.   Lavonia Drafts, MD 10/25/22 [phone]

## 2022-10-25 NOTE — ED Notes (Signed)
Urinal emptied and given back to pt before this RN realized urine sample needed to be sent to lab. Will have next sample sent to lab.

## 2022-10-25 NOTE — ED Notes (Signed)
Called 2C's secretary Meralind to notify Floor RN that pt heading up in next 3-5 minutes with float RN Cristie Hem.

## 2022-10-25 NOTE — ED Notes (Signed)
Pt currently providing new urine sample. Will give gabapentin when pt is finished.

## 2022-10-26 DIAGNOSIS — I82401 Acute embolism and thrombosis of unspecified deep veins of right lower extremity: Secondary | ICD-10-CM | POA: Diagnosis not present

## 2022-10-26 DIAGNOSIS — Z86711 Personal history of pulmonary embolism: Secondary | ICD-10-CM | POA: Diagnosis not present

## 2022-10-26 LAB — BASIC METABOLIC PANEL
Anion gap: 5 (ref 5–15)
BUN: 18 mg/dL (ref 8–23)
CO2: 22 mmol/L (ref 22–32)
Calcium: 8.6 mg/dL — ABNORMAL LOW (ref 8.9–10.3)
Chloride: 114 mmol/L — ABNORMAL HIGH (ref 98–111)
Creatinine, Ser: 0.92 mg/dL (ref 0.61–1.24)
GFR, Estimated: >60 mL/min (ref >60)
Glucose, Bld: 101 mg/dL — ABNORMAL HIGH (ref 70–99)
Potassium: 3.9 mmol/L (ref 3.5–5.1)
Sodium: 141 mmol/L (ref 135–145)

## 2022-10-26 LAB — CBC
HCT: 32 % — ABNORMAL LOW (ref 39.0–52.0)
Hemoglobin: 10 g/dL — ABNORMAL LOW (ref 13.0–17.0)
MCH: 29.6 pg (ref 26.0–34.0)
MCHC: 31.3 g/dL (ref 30.0–36.0)
MCV: 94.7 fL (ref 80.0–100.0)
Platelets: 241 10*3/uL (ref 150–400)
RBC: 3.38 MIL/uL — ABNORMAL LOW (ref 4.22–5.81)
RDW: 14.8 % (ref 11.5–15.5)
WBC: 4.1 10*3/uL (ref 4.0–10.5)
nRBC: 0 % (ref 0.0–0.2)

## 2022-10-26 LAB — APTT: aPTT: 65 seconds — ABNORMAL HIGH (ref 24–36)

## 2022-10-26 NOTE — TOC Initial Note (Signed)
Transition of Care [facility]) - Initial/Assessment Note    Patient Details  Name: Russell [name] [name]: [id number] Date of Birth: 05-Jun-1951  Transition of Care [facility] CM/SW Contact:    Beverly Sessions, RN Phone Number: 10/26/2022, 11:55 AM  Clinical Narrative:                   Transition of Care [facility]) Screening Note   Patient Details  Name: Russell [name] Date of Birth: 1951-02-04   Transition of Care [facility]) CM/SW Contact:    Beverly Sessions, RN Phone Number: 10/26/2022, 11:56 AM    Transition of Care Department [facility]) has reviewed patient and no TOC needs have been identified at this time. We will continue to monitor patient advancement through interdisciplinary progression rounds. If new patient transition needs arise, please place a TOC consult.         Patient Goals and CMS Choice        Expected Discharge Plan and Services                                                Prior Living Arrangements/Services                       Activities of Daily Living Home Assistive Devices/Equipment: Cane (specify quad or straight) ADL Screening (condition at time of admission) Patient's cognitive ability adequate to safely complete daily activities?: No Is the patient deaf or have difficulty hearing?: No Does the patient have difficulty seeing, even when wearing glasses/contacts?: No Does the patient have difficulty concentrating, remembering, or making decisions?: No Patient able to express need for assistance with ADLs?: No Does the patient have difficulty dressing or bathing?: No Independently performs ADLs?: Yes (appropriate for developmental age) Communication: Independent Dressing (OT): Independent Grooming: Independent Feeding: Independent Bathing: Independent Toileting: Independent In/Out Bed: Independent Walks in Home: Independent Does the patient have difficulty walking or climbing stairs?: No Weakness of Legs: Right Weakness of  Arms/Hands: None  Permission Sought/Granted                  Emotional Assessment              Admission diagnosis:  DVT (deep venous thrombosis) (HCC) [I82.409] Deep vein thrombosis (DVT) of femoral vein of right lower extremity, unspecified chronicity ([place]) [I82.411] Patient Active Problem List   Diagnosis Date Noted   DVT (deep venous thrombosis) ([place]) 10/25/2022   Normocytic anemia 10/25/2022   ILD (interstitial lung disease) ([place]) 10/25/2022   Overweight (BMI 25.0-29.9) 04/08/2022   HTN (hypertension) 04/07/2022   Pulmonary embolism ([place]) 04/07/2022   MRSA infection 04/07/2022   Peripheral neuropathy 04/07/2022   DVT, recurrent, lower extremity, acute, right ([place]) 04/07/2022   Fever 04/07/2022   Campylobacter diarrhea 04/07/2022   Knee pain 09/22/2021   Dyslipidemia 09/05/2021   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   Peripheral polyneuropathy 02/10/2021   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   Primary osteoarthritis of left knee 01/06/2021   Diverticulitis of colon with perforation 05/03/2020   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   Carpal joint sprain 09/11/2019   Cervical spondylosis without myelopathy 09/11/2019   DDD (degenerative disc disease), cervical 09/11/2019   Lumbosacral stenosis 08/30/2019   Degeneration of lumbosacral intervertebral disc 08/30/2019   Unilateral vocal cord paralysis 08/03/2018   Hypotension 02/10/2018   Acute blood loss  anemia 02/10/2018   S/P spinal fusion 01/17/2018   Other specified abnormal immunological findings in serum 11/27/2017   Postoperative or surgical complication 24/07/7352   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[place]) 11/25/2017   Fluid collection at surgical site 11/25/2017   Delirium due to another medical condition 10/05/2017   BMI 26.0-26.9,adult 06/26/2017   GERD (gastroesophageal reflux disease) 05/09/2017   Chronic cough 11/21/2016   Heterotopic ossification 03/01/2016   DRESS syndrome 11/27/2015    Pyuria 11/27/2015   Oropharyngeal dysphagia 11/27/2015   Macrocytic anemia 11/27/2015   History of pulmonary embolism 11/27/2015   History of malnutrition 11/27/2015   Acute encephalopathy 11/27/2015   Intensive care (ICU) myopathy 11/27/2015   Urinary retention 11/27/2015   Personal history of other malignant neoplasm of skin 07/17/2015   Hyperlipidemia, mixed 05/05/2015   Excessive cerumen in both ear canals 04/23/2015   Skin lesions, generalized 04/23/2015   Need for vaccination 04/23/2015   Increased frequency of urination 29/92/4268   Helicobacter pylori (H. pylori) infection 02/11/2014   Dyspepsia 02/05/2014   Neck pain 10/11/2013   Pain in joint, pelvic region and thigh 10/11/2013   Low back pain 10/11/2013   PCP:  Norm Parcel, MD Pharmacy:   [facility] [address], [place] [address] [place] [address] Phone: [phone] Fax: [phone]     Social Determinants of Health (SDOH) Interventions    Readmission Risk Interventions     No data to display

## 2022-10-26 NOTE — Consult Note (Signed)
ANTICOAGULATION CONSULT NOTE - Consult  Pharmacy Consult for heparin gtt Indication: VTE treatment  Allergies  Allergen Reactions   Penicillins Rash    Beta-lactam allergy, DRESS Beta-lactam allergy, DRESS    Shrimp [Shellfish Allergy] Hives   Cisatracurium Other (See Comments)    Other reaction(s): Unknown Other reaction(s): Unknown    Heparin Hives and Swelling    Patient's daughter reports patient had swelling/hives during hospital admission in [place]. They were on several medications at once so she is unsure whether it was VANCOMYCIN that caused this reaction or another concomitant medication.    Rocuronium Other (See Comments)    Other reaction(s): Unknown Other reaction(s): Unknown    Vancomycin Hives and Swelling    Patient's daughter reports patient had swelling/hives during hospital admission in [place]. They were on several medications at once so she is unsure whether it was VANCOMYCIN that caused this reaction or another concomitant medication.     Patient Measurements: Height: '5\' 10"'$  (177.8 cm) IBW/kg (Calculated) : 73 Heparin Dosing Weight: 93.9 kg  Vital Signs: Temp: 98.2 F (36.8 C) (12/05 2145) Temp Source: Oral (12/05 2145) BP: 109/78 (12/05 2145) Pulse Rate: 79 (12/05 2145)  Labs: Recent Labs    10/25/22 1144 10/25/22 2128 10/25/22 2130  HGB 11.2*  --   --   HCT 37.0*  --   --   PLT 262  --   --   APTT  --   --  63*  LABPROT  --  18.4*  --   INR  --  1.5*  --   CREATININE 1.01  --   --      CrCl cannot be calculated (Unknown ideal weight.).  Medications:  Allergies: Patient's daughter reports patient had swelling/hives during hospital admission in [place]. They were on several medications at once so she is unsure whether it was  heparin that caused this reaction or another concomitant medication.    Assessment: Russell [name] is a 71 y.o. male w/ h/o HTN, HLD, arthritis, history of DVT (03/2022 was on Eliquis but stopped 04/2022 due to cost) whom  was seen on 09/2022 with worsening of DVT. Pt discharged on Eliquis on 11/19, but now presenting with worsening of DVT RLE.   Of note, pt's daughter reports possible heparin allergy resulting in hives, discussed alternative agents with ED, including bivalirudin. Of note, patient has tolerated multiple doses of enoxaparin (LMWH) in the past. On last admission, possibility of vascular intervention, led to starting bivalirudin over lovenox. Pharmacy has been consulted to initiate and manage bivalrudin infusion.   Baseline Labs: aPTT 30s, Hgb 12.7>11.2, Hct 39>37, Plt 162>262   Goal of Therapy:  aPTT 50-85 seconds Monitor platelets by anticoagulation protocol: Yes   Date    Time    aPTT   Rate (mg/h)/Comment  11/16   1929    29        (14.1); SUBtherapeutic 0.15>0.195 mg/k/h 11/17   0055    66        (18.3); therapeutic x1; 0.195 mg/k/h 11/17   0541    75        (18.3); therapeutic x2; 0.195 mg/k/h  ----- recently therapeutic on the above  12/5 2130    63         (18.3); therapeutic X 1; 0.195 mg/kg/hr    Plan:  Pt recently admitted and on bivalirudin, last therapeutic at 18.'3mg'$ /h (0.'195mg'$ /k/h -- dosing weight of 93.9kg was used).  12/5:  aPTT @ 2130 = 63, therapeutic X  1  Will continue pt on current rate and recheck aPTT in 4 hrs on 12/6 @ 0200.  Next aPTT level in 4hrs; then daily once consecutively therapeutic or more frequently if needed in critical illness. CTM daily CBC's  Khalea Ventura D 10/26/2022,12:36 AM

## 2022-10-26 NOTE — Progress Notes (Addendum)
Progress Note    Russell [name]  HKV:[id number] DOB: 08/05/1951  DOA: 10/25/2022 PCP: Norm Parcel, MD      Brief Narrative:    Medical records reviewed and are as summarized below:  Russell [name] is a 71 y.o. male with medical history significant of PE and DVT, HTN, HLD, anemia, ILD, who present with right leg pain and swelling.         Assessment/Plan:   Active Problems:   DVT, recurrent, lower extremity, acute, right (HCC)   History of pulmonary embolism   ILD (interstitial lung disease) (HCC)   Normocytic anemia   HTN (hypertension)   Dyslipidemia    Body mass index is 30.62 kg/m.  (Obesity)  Recurrent acute right lower extremity DVT: Continue IV bivalirudin infusion and monitor aPTT per protocol.  Analgesia used as needed for pain.  Plan for thrombectomy tomorrow.  History of pulmonary embolism: CTA on 10/06/2022 was negative for PE.  Interstitial lung disease: Compensated  Other comorbidities include normocytic anemia, hypertension, dyslipidemia   Diet Order             Diet Heart Room service appropriate? Yes; Fluid consistency: Thin  Diet effective now                            Consultants: Vascular surgeon  Procedures: None    Medications:    gabapentin  300 mg Oral TID   Continuous Infusions:  bivalirudin (ANGIOMAX) 250 mg in sodium chloride 0.9 % 500 mL (0.5 mg/mL) infusion 0.1949 mg/kg/hr (10/26/22 0804)     Anti-infectives (From admission, onward)    None              Family Communication/Anticipated D/C date and plan/Code Status   DVT prophylaxis:      Code Status: Full Code  Family Communication: None Disposition Plan: Plan to discharge home when medically stable   Status is: Inpatient Remains inpatient appropriate because: Needs right lower extremity thrombectomy       Subjective:   Interval events noted.  He complains of significant swelling of right lower extremity.  No  pain in the lower extremities.  No shortness of breath or chest pain.   Objective:    Vitals:   10/25/22 2100 10/25/22 2104 10/25/22 2145 10/26/22 0804  BP: 105/70 104/70 109/78 108/69  Pulse:  77 79 81  Resp:  '20 20 18  '$ Temp:   98.2 F (36.8 C) 98.3 F (36.8 C)  TempSrc:   Oral Oral  SpO2:  95% 96% 94%  Weight:   96.8 kg   Height:   '5\' 10"'$  (1.778 m)    No data found.   Intake/Output Summary (Last 24 hours) at 10/26/2022 1047 Last data filed at 10/26/2022 0900 Gross per 24 hour  Intake 797.87 ml  Output 1550 ml  Net -752.13 ml   Filed Weights   10/25/22 2145  Weight: 96.8 kg    Exam:  GEN: NAD SKIN: Warm and dry EYES: No pallor or icterus ENT: MMM CV: RRR PULM: CTA B ABD: soft, ND, NT, +BS CNS: AAO x 3, non focal EXT: Significant swelling of entire right lower extremity.  No erythema or tenderness.  Mild edema of left leg.        Data Reviewed:   I have personally reviewed following labs and imaging studies:  Labs: Labs show the following:   Basic Metabolic Panel: Recent Labs  Lab 10/25/22 1144  10/26/22 0308  NA 139 141  K 4.3 3.9  CL 113* 114*  CO2 17* 22  GLUCOSE 103* 101*  BUN 20 18  CREATININE 1.01 0.92  CALCIUM 9.1 8.6*   GFR Estimated Creatinine Clearance: 84.7 mL/min (by C-G formula based on SCr of 0.92 mg/dL). Liver Function Tests: Recent Labs  Lab 10/25/22 1144  AST 14*  ALT 20  ALKPHOS 75  BILITOT 1.1  PROT 7.2  ALBUMIN 4.0   No results for input(s): "LIPASE", "AMYLASE" in the last 168 hours. No results for input(s): "AMMONIA" in the last 168 hours. Coagulation profile Recent Labs  Lab 10/25/22 2128  INR 1.5*    CBC: Recent Labs  Lab 10/25/22 1144 10/26/22 0308  WBC 6.3 4.1  NEUTROABS 4.0  --   HGB 11.2* 10.0*  HCT 37.0* 32.0*  MCV 96.6 94.7  PLT 262 241   Cardiac Enzymes: No results for input(s): "CKTOTAL", "CKMB", "CKMBINDEX", "TROPONINI" in the last 168 hours. BNP (last 3 results) No results for  input(s): "PROBNP" in the last 8760 hours. CBG: No results for input(s): "GLUCAP" in the last 168 hours. D-Dimer: No results for input(s): "DDIMER" in the last 72 hours. Hgb A1c: No results for input(s): "HGBA1C" in the last 72 hours. Lipid Profile: No results for input(s): "CHOL", "HDL", "LDLCALC", "TRIG", "CHOLHDL", "LDLDIRECT" in the last 72 hours. Thyroid function studies: No results for input(s): "TSH", "T4TOTAL", "T3FREE", "THYROIDAB" in the last 72 hours.  Invalid input(s): "FREET3" Anemia work up: No results for input(s): "VITAMINB12", "FOLATE", "FERRITIN", "TIBC", "IRON", "RETICCTPCT" in the last 72 hours. Sepsis Labs: Recent Labs  Lab 10/25/22 1144 10/26/22 0308  WBC 6.3 4.1    Microbiology Recent Results (from the past 240 hour(s))  Resp Panel by RT-PCR (Flu A&B, Covid) Anterior Nasal Swab     Status: None   Collection Time: 10/25/22 11:44 AM   Specimen: Anterior Nasal Swab  Result Value Ref Range Status   SARS Coronavirus 2 by RT PCR NEGATIVE NEGATIVE Final    Comment: (NOTE) SARS-CoV-2 target nucleic acids are NOT DETECTED.  The SARS-CoV-2 RNA is generally detectable in upper respiratory specimens during the acute phase of infection. The lowest concentration of SARS-CoV-2 viral copies this assay can detect is 138 copies/mL. A negative result does not preclude SARS-Cov-2 infection and should not be used as the sole basis for treatment or other patient management decisions. A negative result may occur with  improper specimen collection/handling, submission of specimen other than nasopharyngeal swab, presence of viral mutation(s) within the areas targeted by this assay, and inadequate number of viral copies(<138 copies/mL). A negative result must be combined with clinical observations, patient history, and epidemiological information. The expected result is Negative.  Fact Sheet for Patients:  EntrepreneurPulse.com.au  Fact Sheet for  Healthcare Providers:  IncredibleEmployment.be  This test is no t yet approved or cleared by the [place] FDA and  has been authorized for detection and/or diagnosis of SARS-CoV-2 by FDA under an Emergency Use Authorization (EUA). This EUA will remain  in effect (meaning this test can be used) for the duration of the COVID-19 declaration under Section 564(b)(1) of the Act, 21 U.S.C.section 360bbb-3(b)(1), unless the authorization is terminated  or revoked sooner.       Influenza A by PCR NEGATIVE NEGATIVE Final   Influenza B by PCR NEGATIVE NEGATIVE Final    Comment: (NOTE) The Xpert Xpress SARS-CoV-2/FLU/RSV plus assay is intended as an aid in the diagnosis of influenza from Nasopharyngeal swab specimens and  should not be used as a sole basis for treatment. Nasal washings and aspirates are unacceptable for Xpert Xpress SARS-CoV-2/FLU/RSV testing.  Fact Sheet for Patients: EntrepreneurPulse.com.au  Fact Sheet for Healthcare Providers: IncredibleEmployment.be  This test is not yet approved or cleared by the [place] FDA and has been authorized for detection and/or diagnosis of SARS-CoV-2 by FDA under an Emergency Use Authorization (EUA). This EUA will remain in effect (meaning this test can be used) for the duration of the COVID-19 declaration under Section 564(b)(1) of the Act, 21 U.S.C. section 360bbb-3(b)(1), unless the authorization is terminated or revoked.  Performed at [facility], [address] [place], [place] [address]     Procedures and diagnostic studies:  US Venous Img Lower Unilateral Right  Result Date: 10/25/2022 CLINICAL DATA:  Pain and swelling EXAM: Right LOWER EXTREMITY VENOUS DOPPLER ULTRASOUND TECHNIQUE: Gray-scale sonography with compression, as well as color and duplex ultrasound, were performed to evaluate the deep venous system(s) from the level of the common femoral vein  through the popliteal and proximal calf veins. COMPARISON:  None Available. FINDINGS: VENOUS There is evidence of occlusive deep venous thrombosis in right common femoral vein. Nonocclusive DVT is noted in the proximal femoral vein. Rest of the major deep veins appear patent. There is nonocclusive superficial phlebitis in greater saphenous vein. Limited views of the contralateral common femoral vein are unremarkable. OTHER None. Limitations: none IMPRESSION: Occlusive DVT is seen in right common femoral vein. Nonocclusive DVT is noted in proximal course of right femoral vein. Superficial phlebitis is noted in the right greater saphenous vein. Electronically Signed   By: Elmer Picker M.D.   On: 10/25/2022 13:33   DG Chest 2 View  Result Date: 10/25/2022 CLINICAL DATA:  Cough and shortness of breath.  Leg swelling. EXAM: CHEST - 2 VIEW COMPARISON:  06/11/2022 FINDINGS: Thoracic spine fixation, suboptimally evaluated. Upper thoracic vertebral corpectomy. IVC filter. Patient rotated to the right. Mild cardiomegaly. No pleural effusion or pneumothorax. Peripheral and basilar predominant interstitial thickening likely corresponds to interstitial lung disease on 10/06/2022 CT. No superimposed congestive failure or lobar consolidation. IMPRESSION: Interstitial lung disease, grossly similar to the prior CTA chest. Consider pulmonology consultation for eventual dedicated high-resolution chest CT. Cardiomegaly without superimposed congestive failure or acute process. Electronically Signed   By: Abigail Miyamoto M.D.   On: 10/25/2022 12:26               LOS: 1 day   [place]  [facility]   Pager on [email or web address]. If 7PM-7AM, please contact night-coverage at [email or web address]     10/26/2022, 10:47 AM

## 2022-10-26 NOTE — Plan of Care (Signed)

## 2022-10-26 NOTE — Consult Note (Signed)
ANTICOAGULATION CONSULT NOTE - Consult  Pharmacy Consult for heparin gtt Indication: VTE treatment  Allergies  Allergen Reactions   Penicillins Rash    Beta-lactam allergy, DRESS Beta-lactam allergy, DRESS    Shrimp [Shellfish Allergy] Hives   Cisatracurium Other (See Comments)    Other reaction(s): Unknown Other reaction(s): Unknown    Heparin Hives and Swelling    Patient's daughter reports patient had swelling/hives during hospital admission in [place]. They were on several medications at once so she is unsure whether it was VANCOMYCIN that caused this reaction or another concomitant medication.    Rocuronium Other (See Comments)    Other reaction(s): Unknown Other reaction(s): Unknown    Vancomycin Hives and Swelling    Patient's daughter reports patient had swelling/hives during hospital admission in [place]. They were on several medications at once so she is unsure whether it was VANCOMYCIN that caused this reaction or another concomitant medication.     Patient Measurements: Height: '5\' 10"'$  (177.8 cm) Weight: 96.8 kg (213 lb 6.5 oz) IBW/kg (Calculated) : 73 Heparin Dosing Weight: 93.9 kg  Vital Signs: Temp: 98.2 F (36.8 C) (12/05 2145) Temp Source: Oral (12/05 2145) BP: 109/78 (12/05 2145) Pulse Rate: 79 (12/05 2145)  Labs: Recent Labs    10/25/22 1144 10/25/22 2128 10/25/22 2130 10/26/22 0308  HGB 11.2*  --   --  10.0*  HCT 37.0*  --   --  32.0*  PLT 262  --   --  241  APTT  --   --  63* 65*  LABPROT  --  18.4*  --   --   INR  --  1.5*  --   --   CREATININE 1.01  --   --  0.92     Estimated Creatinine Clearance: 84.7 mL/min (by C-G formula based on SCr of 0.92 mg/dL).  Medications:  Allergies: Patient's daughter reports patient had swelling/hives during hospital admission in [place]. They were on several medications at once so she is unsure whether it was  heparin that caused this reaction or another concomitant medication.    Assessment: Russell [name]  is a 71 y.o. male w/ h/o HTN, HLD, arthritis, history of DVT (03/2022 was on Eliquis but stopped 04/2022 due to cost) whom was seen on 09/2022 with worsening of DVT. Pt discharged on Eliquis on 11/19, but now presenting with worsening of DVT RLE.   Of note, pt's daughter reports possible heparin allergy resulting in hives, discussed alternative agents with ED, including bivalirudin. Of note, patient has tolerated multiple doses of enoxaparin (LMWH) in the past. On last admission, possibility of vascular intervention, led to starting bivalirudin over lovenox. Pharmacy has been consulted to initiate and manage bivalrudin infusion.   Baseline Labs: aPTT 30s, Hgb 12.7>11.2, Hct 39>37, Plt 162>262   Goal of Therapy:  aPTT 50-85 seconds Monitor platelets by anticoagulation protocol: Yes   Date    Time    aPTT   Rate (mg/h)/Comment  11/16   1929    29        (14.1); SUBtherapeutic 0.15>0.195 mg/k/h 11/17   0055    66        (18.3); therapeutic x1; 0.195 mg/k/h 11/17   0541    75        (18.3); therapeutic x2; 0.195 mg/k/h  ----- recently therapeutic on the above  12/5 2130    63         (18.3); therapeutic X 1; 0.195 mg/kg/hr  12/6  0308    65         (18.3); therapeutic X 2: 0.195 mg/kg/hr   Plan:  Pt recently admitted and on bivalirudin, last therapeutic at 18.'3mg'$ /h (0.'195mg'$ /k/h -- dosing weight of 93.9kg was used).  12/5:  aPTT @ 2130 = 63, therapeutic X 1  Will continue pt on current rate and recheck aPTT in 4 hrs on 12/6 @ 0200.   12/6: aPTT @ 0308 = 65; therapeutic X 2 Will continue pt on current rate and recheck aPTT on 12/7 with AM labs.   Next aPTT level in 4hrs; then daily once consecutively therapeutic or more frequently if needed in critical illness. CTM daily CBC's  Deangela Randleman D 10/26/2022,3:40 AM

## 2022-10-27 ENCOUNTER — Other Ambulatory Visit (HOSPITAL_COMMUNITY): Payer: Self-pay

## 2022-10-27 ENCOUNTER — Encounter
Admission: EM | Disposition: A | Discharge status: Home / Self Care | Payer: Self-pay | Source: Home / Self Care | Attending: Internal Medicine

## 2022-10-27 DIAGNOSIS — I82220 Acute embolism and thrombosis of inferior vena cava: Secondary | ICD-10-CM | POA: Diagnosis not present

## 2022-10-27 DIAGNOSIS — T82868A Thrombosis of vascular prosthetic devices, implants and grafts, initial encounter: Secondary | ICD-10-CM | POA: Diagnosis not present

## 2022-10-27 DIAGNOSIS — I871 Compression of vein: Secondary | ICD-10-CM

## 2022-10-27 DIAGNOSIS — Z9889 Other specified postprocedural states: Secondary | ICD-10-CM | POA: Diagnosis not present

## 2022-10-27 DIAGNOSIS — J849 Interstitial pulmonary disease, unspecified: Secondary | ICD-10-CM | POA: Diagnosis not present

## 2022-10-27 DIAGNOSIS — I82411 Acute embolism and thrombosis of right femoral vein: Secondary | ICD-10-CM | POA: Diagnosis not present

## 2022-10-27 DIAGNOSIS — I9589 Other hypotension: Secondary | ICD-10-CM | POA: Diagnosis not present

## 2022-10-27 DIAGNOSIS — I82401 Acute embolism and thrombosis of unspecified deep veins of right lower extremity: Secondary | ICD-10-CM | POA: Diagnosis not present

## 2022-10-27 DIAGNOSIS — E861 Hypovolemia: Secondary | ICD-10-CM

## 2022-10-27 DIAGNOSIS — I82421 Acute embolism and thrombosis of right iliac vein: Secondary | ICD-10-CM

## 2022-10-27 DIAGNOSIS — Z86711 Personal history of pulmonary embolism: Secondary | ICD-10-CM | POA: Diagnosis not present

## 2022-10-27 HISTORY — PX: PERIPHERAL VASCULAR THROMBECTOMY: CATH118306

## 2022-10-27 LAB — CBC
HCT: 34.4 % — ABNORMAL LOW (ref 39.0–52.0)
HCT: 34.5 % — ABNORMAL LOW (ref 39.0–52.0)
Hemoglobin: 10.7 g/dL — ABNORMAL LOW (ref 13.0–17.0)
Hemoglobin: 11.1 g/dL — ABNORMAL LOW (ref 13.0–17.0)
MCH: 29.6 pg (ref 26.0–34.0)
MCH: 30.8 pg (ref 26.0–34.0)
MCHC: 31.1 g/dL (ref 30.0–36.0)
MCHC: 32.2 g/dL (ref 30.0–36.0)
MCV: 95 fL (ref 80.0–100.0)
MCV: 95.8 fL (ref 80.0–100.0)
Platelets: 225 10*3/uL (ref 150–400)
Platelets: 248 10*3/uL (ref 150–400)
RBC: 3.62 MIL/uL — ABNORMAL LOW (ref 4.22–5.81)
RBC: 3.6 MIL/uL — ABNORMAL LOW (ref 4.22–5.81)
RDW: 14.6 % (ref 11.5–15.5)
RDW: 14.5 % (ref 11.5–15.5)
WBC: 4.2 10*3/uL (ref 4.0–10.5)
WBC: 12.6 10*3/uL — ABNORMAL HIGH (ref 4.0–10.5)
nRBC: 0 % (ref 0.0–0.2)
nRBC: 0 % (ref 0.0–0.2)

## 2022-10-27 LAB — APTT: aPTT: 62 seconds — ABNORMAL HIGH (ref 24–36)

## 2022-10-27 SURGERY — PERIPHERAL VASCULAR THROMBECTOMY
Anesthesia: Moderate Sedation

## 2022-10-27 MED ORDER — MIDAZOLAM HCL 2 MG/2ML IJ SOLN
INTRAMUSCULAR | Status: DC | PRN
  Administered 2022-10-27 (×2): 1 mg via INTRAVENOUS

## 2022-10-27 MED ORDER — CHLORHEXIDINE GLUCONATE 4 % EX LIQD: 60.0000 mL | Freq: Once | CUTANEOUS | Status: DC

## 2022-10-27 MED ORDER — ALTEPLASE 2 MG IJ SOLR
INTRAMUSCULAR | Status: AC
  Filled 2022-10-27: qty 8

## 2022-10-27 MED ORDER — METHYLPREDNISOLONE SODIUM SUCC 125 MG IJ SOLR: 125.0000 mg | Freq: Once | INTRAMUSCULAR | Status: DC

## 2022-10-27 MED ORDER — METHYLPREDNISOLONE SODIUM SUCC 125 MG IJ SOLR
125.0000 mg | Freq: Once | INTRAMUSCULAR | Status: AC | PRN
  Administered 2022-10-27: 125 mg via INTRAVENOUS

## 2022-10-27 MED ORDER — CIPROFLOXACIN IN D5W 400 MG/200ML IV SOLN
400.0000 mg | Freq: Once | INTRAVENOUS | Status: AC
  Administered 2022-10-27: 400 mg via INTRAVENOUS

## 2022-10-27 MED ORDER — DIPHENHYDRAMINE HCL 50 MG/ML IJ SOLN: 50.0000 mg | Freq: Once | INTRAMUSCULAR | Status: DC | PRN

## 2022-10-27 MED ORDER — ONDANSETRON HCL 4 MG/2ML IJ SOLN
INTRAMUSCULAR | Status: AC
  Administered 2022-10-27: 4 mg via INTRAVENOUS
  Filled 2022-10-27: qty 2

## 2022-10-27 MED ORDER — SODIUM CHLORIDE 0.9 % IV SOLN: INTRAVENOUS | Status: DC

## 2022-10-27 MED ORDER — DOPAMINE-DEXTROSE 3.2-5 MG/ML-% IV SOLN
0.0000 ug/kg/min | INTRAVENOUS | Status: DC
  Administered 2022-10-27: 5 ug/kg/min via INTRAVENOUS
  Administered 2022-10-28: 10 ug/kg/min via INTRAVENOUS
  Filled 2022-10-27: qty 250

## 2022-10-27 MED ORDER — CEFAZOLIN SODIUM-DEXTROSE 2-4 GM/100ML-% IV SOLN
INTRAVENOUS | Status: AC
  Filled 2022-10-27: qty 100

## 2022-10-27 MED ORDER — FAMOTIDINE 20 MG PO TABS: 40.0000 mg | ORAL_TABLET | Freq: Once | ORAL | Status: DC | PRN

## 2022-10-27 MED ORDER — SODIUM CHLORIDE FLUSH 0.9 % IV SOLN
INTRAVENOUS | Status: AC
  Filled 2022-10-27: qty 20

## 2022-10-27 MED ORDER — METHYLPREDNISOLONE SODIUM SUCC 125 MG IJ SOLR
INTRAMUSCULAR | Status: AC
  Filled 2022-10-27: qty 2

## 2022-10-27 MED ORDER — CIPROFLOXACIN IN D5W 400 MG/200ML IV SOLN
INTRAVENOUS | Status: AC
  Administered 2022-10-27: 2 mg
  Filled 2022-10-27: qty 200

## 2022-10-27 MED ORDER — CHLORHEXIDINE GLUCONATE CLOTH 2 % EX PADS
6.0000 | MEDICATED_PAD | Freq: Every day | CUTANEOUS | Status: DC
  Administered 2022-10-27 – 2022-10-31 (×5): 6 via TOPICAL

## 2022-10-27 MED ORDER — MIDAZOLAM HCL 2 MG/2ML IJ SOLN
INTRAMUSCULAR | Status: AC
  Filled 2022-10-27: qty 2

## 2022-10-27 MED ORDER — DOPAMINE-DEXTROSE 3.2-5 MG/ML-% IV SOLN
INTRAVENOUS | Status: AC
  Filled 2022-10-27: qty 250

## 2022-10-27 MED ORDER — HEPARIN SODIUM (PORCINE) 1000 UNIT/ML IJ SOLN
INTRAMUSCULAR | Status: AC
  Filled 2022-10-27: qty 10

## 2022-10-27 MED ORDER — ALTEPLASE 2 MG IJ SOLR
INTRAMUSCULAR | Status: DC | PRN
  Administered 2022-10-27: 8 mg

## 2022-10-27 MED ORDER — FENTANYL CITRATE (PF) 100 MCG/2ML IJ SOLN
INTRAMUSCULAR | Status: DC | PRN
  Administered 2022-10-27 (×2): 50 ug via INTRAVENOUS

## 2022-10-27 MED ORDER — CEFAZOLIN SODIUM-DEXTROSE 2-4 GM/100ML-% IV SOLN
2.0000 g | INTRAVENOUS | Status: DC
  Filled 2022-10-27: qty 100

## 2022-10-27 MED ORDER — ONDANSETRON HCL 4 MG/2ML IJ SOLN: 4.0000 mg | Freq: Four times a day (QID) | INTRAMUSCULAR | Status: DC | PRN

## 2022-10-27 MED ORDER — FENTANYL CITRATE (PF) 100 MCG/2ML IJ SOLN
INTRAMUSCULAR | Status: AC
  Filled 2022-10-27: qty 2

## 2022-10-27 MED ORDER — DOPAMINE-DEXTROSE 3.2-5 MG/ML-% IV SOLN
INTRAVENOUS | Status: DC | PRN
  Administered 2022-10-27: 20 ug/kg/min via INTRAVENOUS

## 2022-10-27 MED ORDER — SODIUM CHLORIDE 0.9 % IV BOLUS
500.0000 mL | Freq: Once | INTRAVENOUS | Status: AC
  Administered 2022-10-27: 500 mL via INTRAVENOUS

## 2022-10-27 MED ORDER — MIDAZOLAM HCL 2 MG/ML PO SYRP: 8.0000 mg | ORAL_SOLUTION | Freq: Once | ORAL | Status: DC | PRN

## 2022-10-27 MED ORDER — HYDROMORPHONE HCL 1 MG/ML IJ SOLN
1.0000 mg | Freq: Once | INTRAMUSCULAR | Status: DC | PRN
  Filled 2022-10-27: qty 1

## 2022-10-27 SURGICAL SUPPLY — 21 items
BALLN ULTRVRSE 12X80X75 (BALLOONS) ×1
BALLOON ULTRVRSE 12X80X75 (BALLOONS) IMPLANT
CANISTER PENUMBRA ENGINE (MISCELLANEOUS) IMPLANT
CATH BEACON 5 .035 65 KMP TIP (CATHETERS) IMPLANT
CATH LIGHTNI FLASH 16XTORQ 100 (CATHETERS) IMPLANT
CATH LIGHTNING FLASH XTORQ 100 (CATHETERS) ×1
CLOSURE PERCLOSE PROSTYLE (VASCULAR PRODUCTS) IMPLANT
COVER PROBE ULTRASOUND 5X96 (MISCELLANEOUS) IMPLANT
DRYSEAL FLEXSHEATH 16FR 33CM (SHEATH) ×1
GLIDEWIRE ADV .035X260CM (WIRE) IMPLANT
KIT ENCORE 26 ADVANTAGE (KITS) IMPLANT
KIT MICROPUNCTURE NIT STIFF (SHEATH) IMPLANT
NDL ENTRY 21GA 7CM ECHOTIP (NEEDLE) IMPLANT
NEEDLE ENTRY 21GA 7CM ECHOTIP (NEEDLE) ×1 IMPLANT
PACK ANGIOGRAPHY (CUSTOM PROCEDURE TRAY) ×1 IMPLANT
SHEATH BRITE TIP 6FRX11 (SHEATH) IMPLANT
SHEATH DRYSEAL FLEX 16FR 33CM (SHEATH) IMPLANT
SHEATH PINNACLE 11FRX10 (SHEATH) IMPLANT
SUT MNCRL AB 4-0 PS2 18 (SUTURE) IMPLANT
SUT PROLENE 0 CT 1 30 (SUTURE) IMPLANT
WIRE GUIDERIGHT .035X150 (WIRE) IMPLANT

## 2022-10-27 NOTE — Progress Notes (Signed)
Report given to OR nurse. Surgical consent signed. Pt left the floor at 1004

## 2022-10-27 NOTE — Progress Notes (Signed)
Pt. Vomited 250 ml clear emesis. Med. With Zofran 4 mgl slow IVP now. NS bolus 500 ml started secondary to continued low BP with Dopamine gtt.

## 2022-10-27 NOTE — Interval H&P Note (Signed)
History and Physical Interval Note:  10/27/2022 10:36 AM  Russell [name]  has presented today for surgery, with the diagnosis of DVT.  The various methods of treatment have been discussed with the patient and family. After consideration of risks, benefits and other options for treatment, the patient has consented to  Procedure(s): PERIPHERAL VASCULAR THROMBECTOMY (N/A) as a surgical intervention.  The patient's history has been reviewed, patient examined, no change in status, stable for surgery.  I have reviewed the patient's chart and labs.  Questions were answered to the patient's satisfaction.     [name]

## 2022-10-27 NOTE — Consult Note (Signed)
ANTICOAGULATION CONSULT NOTE - Consult  Pharmacy Consult for heparin gtt Indication: VTE treatment  Allergies  Allergen Reactions   Penicillins Rash    Beta-lactam allergy, DRESS Beta-lactam allergy, DRESS    Shrimp [Shellfish Allergy] Hives   Cisatracurium Other (See Comments)    Other reaction(s): Unknown Other reaction(s): Unknown    Heparin Hives and Swelling    Patient's daughter reports patient had swelling/hives during hospital admission in [place]. They were on several medications at once so she is unsure whether it was Russell [name] that caused this reaction or another concomitant medication.    Rocuronium Other (See Comments)    Other reaction(s): Unknown Other reaction(s): Unknown    Russell [name] Hives and Swelling    Patient's daughter reports patient had swelling/hives during hospital admission in [place]. They were on several medications at once so she is unsure whether it was Russell [name] that caused this reaction or another concomitant medication.     Patient Measurements: Height: '5\' 10"'$  (177.8 cm) Weight: 96.8 kg (213 lb 6.5 oz) IBW/kg (Calculated) : 73 Heparin Dosing Weight: 93.9 kg  Vital Signs: Temp: 97.8 F (36.6 C) (12/07 0348) Temp Source: Oral (12/07 0348) BP: 107/73 (12/07 0453) Pulse Rate: 77 (12/07 0453)  Labs: Recent Labs    10/25/22 1144 10/25/22 2128 10/25/22 2130 10/26/22 0308 10/27/22 0414  HGB 11.2*  --   --  10.0* 10.7*  HCT 37.0*  --   --  32.0* 34.4*  PLT 262  --   --  241 225  APTT  --   --  63* 65* 62*  LABPROT  --  18.4*  --   --   --   INR  --  1.5*  --   --   --   CREATININE 1.01  --   --  0.92  --      Estimated Creatinine Clearance: 84.7 mL/min (by C-G formula based on SCr of 0.92 mg/dL).  Medications:  Allergies: Patient's daughter reports patient had swelling/hives during hospital admission in [place]. They were on several medications at once so she is unsure whether it was  heparin that caused this reaction or another  concomitant medication.    Assessment: Russell [name] is a 71 y.o. male w/ h/o HTN, HLD, arthritis, history of DVT (03/2022 was on Russell [name] but stopped 04/2022 due to cost) whom was seen on 09/2022 with worsening of DVT. Pt discharged on Russell [name] on 11/19, but now presenting with worsening of DVT RLE.   Of note, pt's daughter reports possible heparin allergy resulting in hives, discussed alternative agents with ED, including Russell [name]. Of note, patient has tolerated multiple doses of Russell [name] (LMWH) in the past. On last admission, possibility of vascular intervention, led to starting Russell [name] over Russell [name]. Pharmacy has been consulted to initiate and manage bivalrudin infusion.   Baseline Labs: aPTT 30s, Hgb 12.7>11.2, Hct 39>37, Plt 162>262   Goal of Therapy:  aPTT 50-85 seconds Monitor platelets by anticoagulation protocol: Yes   Date    Time    aPTT   Rate (mg/h)/Comment  11/16   1929    29        (14.1); SUBtherapeutic 0.15>0.195 mg/k/h 11/17   0055    66        (18.3); therapeutic x1; 0.195 mg/k/h 11/17   0541    75        (18.3); therapeutic x2; 0.195 mg/k/h  ----- recently therapeutic on the above  12/5 2130    63         (  18.3); therapeutic X 1; 0.195 mg/kg/hr  12/6     0308    65         (18.3); therapeutic X 2: 0.195 mg/kg/hr 12/7     0414    62         (18.3); therapeutic X 3; 0.195 mg/kg/hr    Plan:  Pt recently admitted and on Russell [name], last therapeutic at 18.'3mg'$ /h (0.'195mg'$ /k/h -- dosing weight of 93.9kg was used).  12/5:  aPTT @ 2130 = 63, therapeutic X 1  Will continue pt on current rate and recheck aPTT in 4 hrs on 12/6 @ 0200.   12/6: aPTT @ 0308 = 65; therapeutic X 2 Will continue pt on current rate and recheck aPTT on 12/7 with AM labs.   12/7:  aPTT @ 7425 = 62; therapeutic X 3 Will continue pt on current rate and recheck aPTT on 12/7 with AM labs.  Next aPTT level in 4hrs; then daily once consecutively therapeutic or more frequently if needed in critical  illness. CTM daily CBC's  Analeese Andreatta D 10/27/2022,6:01 AM

## 2022-10-27 NOTE — Op Note (Signed)
[facility]   OPERATIVE NOTE   PRE-OPERATIVE DIAGNOSIS: extensive RLE and IVC DVT  POST-OPERATIVE DIAGNOSIS: same   PROCEDURE: 1.   US guidance for vascular access to right popliteal vein 2.   Catheter placement into right common femoral vein vein from right popliteal approach 3.   IVC gram and right lower extremity venogram 4.   Catheter directed thrombolysis with 8 mg of tPA to the iliac and common femoral veins 5.   Mechanical thrombectomy to right common femoral vein, iliac veins, and IVC with the penumbra 16 lightening device 6.   PTA of the common femoral vein and distal external iliac vein with 12 mm balloon    SURGEON: [name], MD  ASSISTANT(S): none  ANESTHESIA: local with moderate conscious sedation for 40 minutes using 2 mg of Versed and 100 mcg of Fentanyl  ESTIMATED BLOOD LOSS: 600 cc  FINDING(S): 1.  Thrombosis with essentially no flow in the right common femoral vein, external and common iliac veins.  The IVC has a filter in place and there is occlusion of the IVC thrombosis up to and above the IVC filter.  SPECIMEN(S):  none  INDICATIONS:    Patient is a 71 y.o. male who presents with recurrent thrombosis in the right lower extremity after previous intervention with questionable compliance.  Patient has marked leg swelling and pain.  Venous intervention is performed to reduce the symtpoms and avoid long term postphlebitic symptoms.    DESCRIPTION: After obtaining full informed written consent, the patient was brought back to the vascular suite and placed supine upon the table. Moderate conscious sedation was administered during a face to face encounter with the patient throughout the procedure with my supervision of the RN administering medicines and monitoring the patient's vital signs, pulse oximetry, telemetry and mental status throughout from the start of the procedure until the patient was taken to the recovery room.  After obtaining  adequate anesthesia, the patient was prepped and draped in the standard fashion.    The patient was then placed into the prone position.  The right popliteal vein was then accessed under direct ultrasound guidance without difficulty with a micropuncture needle and a permanent image was recorded.  A ProGlide device was placed.  I then upsized to an 16Fr sheath over a J wire.  A Kumpe catheter was then advanced up to the common femoral vein and imaging showed extensive DVT with minimal flow.   I was able to cross the thrombus and stenosis and advance into the IVC which was also thrombosed up into the IVC filter and also appeared thrombosed above the IVC filter.  I then used the Kumpe catheter and instilled 8 milligrams of tpa throughout the iliac veins and common femoral veins.  After this dwelled, I used the Penumbra Cat 16 Lightning catheter and evacuated about 400 cc of effluent with mechanical thrombectomy throughout the iliac veins, CFV, and IVC.  This had marked improvement.  I then treated the distal external iliac vein and CFV with 12 mm diameter angioplasty balloon to treat the residual thrombus and stenosis in this location that was seen after thrombectomy.  At this point, there was only mild residual stenosis and thrombus in the right common femoral vein, the iliac veins were now patent without significant residual thrombus or stenosis.  The IVC was still thrombosed at and above the IVC filter but it was clear up to this point.  We now had over 500 cc of blood loss  and I really had done all we can do from this approach.  I then elected to terminate the procedure.  The sheath was removed and a dressing was placed.  She was taken to the recovery room in stable condition having tolerated the procedure well.    COMPLICATIONS: None  CONDITION: Stable  [name] 10/27/2022 11:53 AM

## 2022-10-27 NOTE — Progress Notes (Addendum)
Progress Note    Russell [name]  NWG:[id number] DOB: November 09, 1951  DOA: 10/25/2022 PCP: Norm Parcel, MD      Brief Narrative:    Medical records reviewed and are as summarized below:  Russell [name] is a 71 y.o. male with medical history significant of PE and DVT, HTN, HLD, anemia, ILD, who present with right leg pain and swelling.         Assessment/Plan:   Principal Problem:   DVT, recurrent, lower extremity, acute, right (HCC) Active Problems:   History of pulmonary embolism   ILD (interstitial lung disease) (HCC)   Hypotension   Normocytic anemia   HTN (hypertension)   Dyslipidemia    Body mass index is 30.62 kg/m.  (Obesity)  Recurrent acute right lower extremity DVT: S/p catheter directed thrombolysis to the right iliac and common femoral vein, mechanical thrombectomy right common femoral, iliac veins and IVC on 10/27/2022. History of recent thrombectomy to right common femoral, external iliac, common iliac veins and inferior vena cava on 10/07/2022.   Hypotension s/p thrombectomy: He was given IV normal saline bolus.  He is on low-dose dopamine drip.  Repeat hemoglobin stable after thrombectomy.   History of pulmonary embolism: CTA on 10/06/2022 was negative for PE.  Interstitial lung disease: Compensated  Other comorbidities include normocytic anemia, hypertension, dyslipidemia   Diet Order             Diet regular Room service appropriate? Yes; Fluid consistency: Thin  Diet effective now                            Consultants: Vascular surgeon  Procedures: None    Medications:    alteplase       fentaNYL       [MAR Hold] gabapentin  300 mg Oral TID   methylPREDNISolone (SOLU-MEDROL) injection  125 mg Intravenous Once   methylPREDNISolone sodium succinate       midazolam       sodium chloride flush       Continuous Infusions:  ceFAZolin     ciprofloxacin     DOPamine       Anti-infectives (From admission,  onward)    Start     Dose/Rate Route Frequency Ordered Stop   10/27/22 1033  ciprofloxacin (CIPRO) 400 MG/200ML IVPB       Note to Pharmacy: Corlis Hove H: cabinet override      10/27/22 1033 10/27/22 2244   10/27/22 1030  ciprofloxacin (CIPRO) IVPB 400 mg        400 mg 200 mL/hr over 60 Minutes Intravenous  Once 10/27/22 1028 10/27/22 1135   10/27/22 0949  ceFAZolin (ANCEF) 2-4 GM/100ML-% IVPB       Note to Pharmacy: Delametter, Gretchen: cabinet override      10/27/22 0949 10/27/22 2159   10/27/22 0615  ceFAZolin (ANCEF) IVPB 2g/100 mL premix  Status:  Discontinued        2 g 200 mL/hr over 30 Minutes Intravenous 30 min pre-op 10/27/22 0615 10/27/22 1034              Family Communication/Anticipated D/C date and plan/Code Status   DVT prophylaxis:      Code Status: Full Code  Family Communication: None Disposition Plan: Plan to discharge home when medically stable   Status is: Inpatient Remains inpatient appropriate because: Needs right lower extremity thrombectomy       Subjective:   Interval is  noted.  Patient was seen at the vascular lab.  Chart review showed he had vomited this afternoon and he was started on normal saline and dopamine drip for low blood pressure after thrombectomy.  Objective:    Vitals:   10/27/22 1545 10/27/22 1550 10/27/22 1555 10/27/22 1600  BP: 103/65 98/63  104/72  Pulse: (!) 108 (!) 104 (!) 104 (!) 103  Resp: (!) 21 (!) 21 (!) 21 (!) 21  Temp:      TempSrc:      SpO2: 94% 93% 93% 92%  Weight:      Height:       No data found.   Intake/Output Summary (Last 24 hours) at 10/27/2022 1636 Last data filed at 10/27/2022 1600 Gross per 24 hour  Intake 1085.93 ml  Output 4550 ml  Net -3464.07 ml   Filed Weights   10/25/22 2145  Weight: 96.8 kg    Exam:    GEN: NAD SKIN: Warm and dry EYES: No pallor or icterus ENT: MMM CV: RRR PULM: CTA B ABD: soft, ND, NT, +BS CNS: AAO x 3, non focal EXT: Right lower  extremity swelling (slightly improved compared to yesterday).  Mild edema of left leg.      Data Reviewed:   I have personally reviewed following labs and imaging studies:  Labs: Labs show the following:   Basic Metabolic Panel: Recent Labs  Lab 10/25/22 1144 10/26/22 0308  NA 139 141  K 4.3 3.9  CL 113* 114*  CO2 17* 22  GLUCOSE 103* 101*  BUN 20 18  CREATININE 1.01 0.92  CALCIUM 9.1 8.6*   GFR Estimated Creatinine Clearance: 84.7 mL/min (by C-G formula based on SCr of 0.92 mg/dL). Liver Function Tests: Recent Labs  Lab 10/25/22 1144  AST 14*  ALT 20  ALKPHOS 75  BILITOT 1.1  PROT 7.2  ALBUMIN 4.0   No results for input(s): "LIPASE", "AMYLASE" in the last 168 hours. No results for input(s): "AMMONIA" in the last 168 hours. Coagulation profile Recent Labs  Lab 10/25/22 2128  INR 1.5*    CBC: Recent Labs  Lab 10/25/22 1144 10/26/22 0308 10/27/22 0414 10/27/22 1234  WBC 6.3 4.1 4.2 12.6*  NEUTROABS 4.0  --   --   --   HGB 11.2* 10.0* 10.7* 11.1*  HCT 37.0* 32.0* 34.4* 34.5*  MCV 96.6 94.7 95.0 95.8  PLT 262 241 225 248   Cardiac Enzymes: No results for input(s): "CKTOTAL", "CKMB", "CKMBINDEX", "TROPONINI" in the last 168 hours. BNP (last 3 results) No results for input(s): "PROBNP" in the last 8760 hours. CBG: No results for input(s): "GLUCAP" in the last 168 hours. D-Dimer: No results for input(s): "DDIMER" in the last 72 hours. Hgb A1c: No results for input(s): "HGBA1C" in the last 72 hours. Lipid Profile: No results for input(s): "CHOL", "HDL", "LDLCALC", "TRIG", "CHOLHDL", "LDLDIRECT" in the last 72 hours. Thyroid function studies: No results for input(s): "TSH", "T4TOTAL", "T3FREE", "THYROIDAB" in the last 72 hours.  Invalid input(s): "FREET3" Anemia work up: No results for input(s): "VITAMINB12", "FOLATE", "FERRITIN", "TIBC", "IRON", "RETICCTPCT" in the last 72 hours. Sepsis Labs: Recent Labs  Lab 10/25/22 1144 10/26/22 0308  10/27/22 0414 10/27/22 1234  WBC 6.3 4.1 4.2 12.6*    Microbiology Recent Results (from the past 240 hour(s))  Resp Panel by RT-PCR (Flu A&B, Covid) Anterior Nasal Swab     Status: None   Collection Time: 10/25/22 11:44 AM   Specimen: Anterior Nasal Swab  Result Value Ref Range  Status   SARS Coronavirus 2 by RT PCR NEGATIVE NEGATIVE Final    Comment: (NOTE) SARS-CoV-2 target nucleic acids are NOT DETECTED.  The SARS-CoV-2 RNA is generally detectable in upper respiratory specimens during the acute phase of infection. The lowest concentration of SARS-CoV-2 viral copies this assay can detect is 138 copies/mL. A negative result does not preclude SARS-Cov-2 infection and should not be used as the sole basis for treatment or other patient management decisions. A negative result may occur with  improper specimen collection/handling, submission of specimen other than nasopharyngeal swab, presence of viral mutation(s) within the areas targeted by this assay, and inadequate number of viral copies(<138 copies/mL). A negative result must be combined with clinical observations, patient history, and epidemiological information. The expected result is Negative.  Fact Sheet for Patients:  EntrepreneurPulse.com.au  Fact Sheet for Healthcare Providers:  IncredibleEmployment.be  This test is no t yet approved or cleared by the [place] FDA and  has been authorized for detection and/or diagnosis of SARS-CoV-2 by FDA under an Emergency Use Authorization (EUA). This EUA will remain  in effect (meaning this test can be used) for the duration of the COVID-19 declaration under Section 564(b)(1) of the Act, 21 U.S.C.section 360bbb-3(b)(1), unless the authorization is terminated  or revoked sooner.       Influenza A by PCR NEGATIVE NEGATIVE Final   Influenza B by PCR NEGATIVE NEGATIVE Final    Comment: (NOTE) The Xpert Xpress SARS-CoV-2/FLU/RSV plus assay  is intended as an aid in the diagnosis of influenza from Nasopharyngeal swab specimens and should not be used as a sole basis for treatment. Nasal washings and aspirates are unacceptable for Xpert Xpress SARS-CoV-2/FLU/RSV testing.  Fact Sheet for Patients: EntrepreneurPulse.com.au  Fact Sheet for Healthcare Providers: IncredibleEmployment.be  This test is not yet approved or cleared by the [place] FDA and has been authorized for detection and/or diagnosis of SARS-CoV-2 by FDA under an Emergency Use Authorization (EUA). This EUA will remain in effect (meaning this test can be used) for the duration of the COVID-19 declaration under Section 564(b)(1) of the Act, 21 U.S.C. section 360bbb-3(b)(1), unless the authorization is terminated or revoked.  Performed at [facility], [address] [place], [place] [address]     Procedures and diagnostic studies:  PERIPHERAL VASCULAR CATHETERIZATION  Result Date: 10/27/2022 See surgical note for result.              LOS: 2 days   Marbeth Smedley  [facility]   Pager on [email or web address]. If 7PM-7AM, please contact night-coverage at [email or web address]     10/27/2022, 4:36 PM

## 2022-10-27 NOTE — Consult Note (Signed)
ANTICOAGULATION CONSULT NOTE - Consult  Pharmacy Consult for heparin gtt Indication: VTE treatment  Allergies  Allergen Reactions   Penicillins Rash    Beta-lactam allergy, DRESS Beta-lactam allergy, DRESS    Shrimp [Shellfish Allergy] Hives   Cisatracurium Other (See Comments)    Other reaction(s): Unknown Other reaction(s): Unknown    Heparin Hives and Swelling    Patient's daughter reports patient had swelling/hives during hospital admission in [place]. They were on several medications at once so she is unsure whether it was VANCOMYCIN that caused this reaction or another concomitant medication.    Rocuronium Other (See Comments)    Other reaction(s): Unknown Other reaction(s): Unknown    Vancomycin Hives and Swelling    Patient's daughter reports patient had swelling/hives during hospital admission in [place]. They were on several medications at once so she is unsure whether it was VANCOMYCIN that caused this reaction or another concomitant medication.     Patient Measurements: Height: '5\' 10"'$  (177.8 cm) Weight: 96.8 kg (213 lb 6.5 oz) IBW/kg (Calculated) : 73 Heparin Dosing Weight: 93.9 kg  Vital Signs: Temp: 98 F (36.7 C) (12/07 0812) Temp Source: Oral (12/07 0348) BP: 106/76 (12/07 0812) Pulse Rate: 76 (12/07 0812)  Labs: Recent Labs    10/25/22 1144 10/25/22 2128 10/25/22 2130 10/26/22 0308 10/27/22 0414  HGB 11.2*  --   --  10.0* 10.7*  HCT 37.0*  --   --  32.0* 34.4*  PLT 262  --   --  241 225  APTT  --   --  63* 65* 62*  LABPROT  --  18.4*  --   --   --   INR  --  1.5*  --   --   --   CREATININE 1.01  --   --  0.92  --      Estimated Creatinine Clearance: 84.7 mL/min (by C-G formula based on SCr of 0.92 mg/dL).  Medications:  Allergies: Patient's daughter reports patient had swelling/hives during hospital admission in [place]. They were on several medications at once so she is unsure whether it was  heparin that caused this reaction or another  concomitant medication.    Assessment: Russell [name] is a 71 y.o. male w/ h/o HTN, HLD, arthritis, history of DVT (03/2022 was on Eliquis but stopped 04/2022 due to cost) whom was seen on 09/2022 with worsening of DVT. Pt discharged on Eliquis on 11/19, but now presenting with worsening of DVT RLE.   Of note, pt's daughter reports possible heparin allergy resulting in hives, discussed alternative agents with ED, including bivalirudin. Of note, patient has tolerated multiple doses of enoxaparin (LMWH) in the past. On last admission, possibility of vascular intervention, led to starting bivalirudin over lovenox. Pharmacy has been consulted to initiate and manage bivalrudin infusion.   Baseline Labs: aPTT 30s, Hgb 12.7>11.2, Hct 39>37, Plt 162>262   Goal of Therapy:  aPTT 50-85 seconds Monitor platelets by anticoagulation protocol: Yes   Date    Time    aPTT   Rate (mg/h)/Comment  11/16   1929    29        (14.1); SUBtherapeutic 0.15>0.195 mg/k/h 11/17   0055    66        (18.3); therapeutic x1; 0.195 mg/k/h 11/17   0541    75        (18.3); therapeutic x2; 0.195 mg/k/h  ----- recently therapeutic on the above  12/5 2130    63         (  18.3); therapeutic X 1; 0.195 mg/kg/hr  12/6     0308    65         (18.3); therapeutic X 2: 0.195 mg/kg/hr 12/7     0414    62         (18.3); therapeutic X 3; 0.195 mg/kg/hr    Plan:  Hold bivalirudin for peripheral vascular thrombectomy planned this morning  Please reconsult if further anticoagulation is warranted after surgical procedure.  Darrick Penna 10/27/2022,9:22 AM

## 2022-10-27 NOTE — TOC Benefit Eligibility Note (Signed)
Patient Advocate Encounter  Insurance verification completed.    The patient is currently admitted and upon discharge could be taking Xarelto 20 mg.  The current 30 day co-pay is $0.00.   The patient is insured through [employer] Medicare Part D   Russell [name], CPHT Pharmacy Patient Advocate Specialist [facility] Patient Advocate Team Direct Number: [phone]  Fax: [phone]       

## 2022-10-28 ENCOUNTER — Encounter: Payer: Self-pay | Admitting: Vascular Surgery

## 2022-10-28 DIAGNOSIS — E861 Hypovolemia: Secondary | ICD-10-CM | POA: Diagnosis not present

## 2022-10-28 DIAGNOSIS — I9589 Other hypotension: Secondary | ICD-10-CM | POA: Diagnosis not present

## 2022-10-28 DIAGNOSIS — J849 Interstitial pulmonary disease, unspecified: Secondary | ICD-10-CM | POA: Diagnosis not present

## 2022-10-28 DIAGNOSIS — I82401 Acute embolism and thrombosis of unspecified deep veins of right lower extremity: Secondary | ICD-10-CM | POA: Diagnosis not present

## 2022-10-28 LAB — LACTIC ACID, PLASMA
Lactic Acid, Venous: 1.6 mmol/L (ref 0.5–1.9)
Lactic Acid, Venous: 1.3 mmol/L (ref 0.5–1.9)

## 2022-10-28 LAB — URINALYSIS, COMPLETE (UACMP) WITH MICROSCOPIC
Bacteria, UA: NONE SEEN
Bilirubin Urine: NEGATIVE
Glucose, UA: NEGATIVE mg/dL
Ketones, ur: NEGATIVE mg/dL
Leukocytes,Ua: NEGATIVE
Nitrite: NEGATIVE
Protein, ur: NEGATIVE mg/dL
Specific Gravity, Urine: 1.006 (ref 1.005–1.030)
Squamous Epithelial / HPF: NONE SEEN (ref 0–5)
pH: 6 (ref 5.0–8.0)

## 2022-10-28 LAB — BASIC METABOLIC PANEL
Anion gap: 7 (ref 5–15)
BUN: 16 mg/dL (ref 8–23)
CO2: 20 mmol/L — ABNORMAL LOW (ref 22–32)
Calcium: 8.3 mg/dL — ABNORMAL LOW (ref 8.9–10.3)
Chloride: 113 mmol/L — ABNORMAL HIGH (ref 98–111)
Creatinine, Ser: 0.89 mg/dL (ref 0.61–1.24)
GFR, Estimated: >60 mL/min (ref >60)
Glucose, Bld: 132 mg/dL — ABNORMAL HIGH (ref 70–99)
Potassium: 3.7 mmol/L (ref 3.5–5.1)
Sodium: 140 mmol/L (ref 135–145)

## 2022-10-28 LAB — CBC
HCT: 33.4 % — ABNORMAL LOW (ref 39.0–52.0)
Hemoglobin: 10.5 g/dL — ABNORMAL LOW (ref 13.0–17.0)
MCH: 29.5 pg (ref 26.0–34.0)
MCHC: 31.4 g/dL (ref 30.0–36.0)
MCV: 93.8 fL (ref 80.0–100.0)
Platelets: 229 10*3/uL (ref 150–400)
RBC: 3.56 MIL/uL — ABNORMAL LOW (ref 4.22–5.81)
RDW: 14.1 % (ref 11.5–15.5)
WBC: 9.7 10*3/uL (ref 4.0–10.5)
nRBC: 0 % (ref 0.0–0.2)

## 2022-10-28 LAB — CORTISOL: Cortisol, Plasma: 2.2 ug/dL

## 2022-10-28 LAB — TROPONIN I (HIGH SENSITIVITY): Troponin I (High Sensitivity): 9 ng/L (ref <18)

## 2022-10-28 MED ORDER — NOREPINEPHRINE 4 MG/250ML-% IV SOLN
2.0000 ug/min | INTRAVENOUS | Status: DC
  Administered 2022-10-28: 2 ug/min via INTRAVENOUS
  Filled 2022-10-28: qty 250

## 2022-10-28 MED ORDER — MIDODRINE HCL 5 MG PO TABS
10.0000 mg | ORAL_TABLET | Freq: Three times a day (TID) | ORAL | Status: DC
  Administered 2022-10-28 – 2022-10-30 (×7): 10 mg via ORAL
  Filled 2022-10-28 (×7): qty 2

## 2022-10-28 MED ORDER — SODIUM BICARBONATE 8.4 % IV SOLN
50.0000 meq | Freq: Once | INTRAVENOUS | Status: AC
  Administered 2022-10-28: 50 meq via INTRAVENOUS
  Filled 2022-10-28: qty 50

## 2022-10-28 MED ORDER — APIXABAN 5 MG PO TABS
10.0000 mg | ORAL_TABLET | Freq: Two times a day (BID) | ORAL | Status: DC
  Administered 2022-10-28 – 2022-10-31 (×7): 10 mg via ORAL
  Filled 2022-10-28 (×7): qty 2

## 2022-10-28 MED ORDER — LACTATED RINGERS IV BOLUS
1000.0000 mL | Freq: Once | INTRAVENOUS | Status: AC
  Administered 2022-10-28: 1000 mL via INTRAVENOUS

## 2022-10-28 MED ORDER — LACTATED RINGERS IV BOLUS
500.0000 mL | Freq: Once | INTRAVENOUS | Status: AC
  Administered 2022-10-28: 500 mL via INTRAVENOUS

## 2022-10-28 MED ORDER — SODIUM CHLORIDE 0.9 % IV BOLUS
500.0000 mL | Freq: Once | INTRAVENOUS | Status: AC
  Administered 2022-10-28: 500 mL via INTRAVENOUS

## 2022-10-28 MED ORDER — HYDROCORTISONE SOD SUC (PF) 100 MG IJ SOLR
100.0000 mg | Freq: Three times a day (TID) | INTRAMUSCULAR | Status: AC
  Administered 2022-10-28 – 2022-10-29 (×3): 100 mg via INTRAVENOUS
  Filled 2022-10-28 (×3): qty 2

## 2022-10-28 MED ORDER — APIXABAN 5 MG PO TABS: 5.0000 mg | ORAL_TABLET | Freq: Two times a day (BID) | ORAL | Status: DC

## 2022-10-28 NOTE — Consult Note (Signed)
NAME:  Russell [name], [name]:  [id number], DOB:  12-27-1950, LOS: 3 ADMISSION DATE:  10/25/2022, CONSULTATION DATE:  10/28/2022 REFERRING MD:  Dr. Lucky Cowboy, CHIEF COMPLAINT:  Right Lower Extremity pain & swelling.   Brief Pt Description / Synopsis:  71 y.o. Male admitted with recurrent thrombus of right lower extremity status post angiogram with catheter directed thrombolysis and thrombectomy.  Post-op with Hypotension requiring vasopressors.  History of Present Illness:  Russell [name] is a 71 y.o. male with medical history significant of PE and DVT, HTN, HLD, anemia, ILD, who presented to [facility] ED on 10/25/2022 with right leg pain and swelling.  Pt has hx of left leg DVT in May 2023. Due to noncompliance of taking Eliquis, he developed right leg DVT.  Patient was hospitalized from 11/16 - 11/19.  Patient is s/p of thrombectomy for right leg DVT.  He states that he is taking his blood thinner consistently.  In the past several days, his right leg swelling has been worsening.  He has erythema and pain in right leg, which is constant, moderate to severe, aching, nonradiating.  Patient denies chest pain, cough, fever or chills.  No nausea vomiting, diarrhea or abdominal pain.  No symptoms of UTI.  Patient does not have recent fall or head injury. No active bleeding.  No dark stool or rectal bleeding.   ED Course: Initial Vital Signs: temperature normal, blood pressure 108/81, heart rate 75, RR 19, oxygen saturation 97% on room air  Significant Labs: WBC 6.3, negative COVID PCR, GFR> 60  Imaging: Chest X-ray>>cardiomegaly and interstitial lung disease  Right lower extremity venous Doppler>>Occlusive DVT is seen in right common femoral vein. Nonocclusive DVT is noted in proximal course of right femoral vein. Superficial phlebitis is noted in the right greater saphenous vein.   Hospitalist were asked to admit for further workup and treatment.  Vascular surgery was consulted for evaluation management of the  DVT.  Please see "significant hospital events" section below for full detailed hospital course.  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  Admitted by Hospitalist.  Vascular Surgery consulted. 12/7: Vascular Surgery performed venogram with catheter directed thrombolysis and mechanical thrombectomy.  Hypotensive requiring Dopamine. 12/8: With persistent Hypotension requiring Dopamine.  PCCM consulted.  Complains of pain/discomfort in lower abdomen, noted to be retaining over 500 cc, plan for I&O catheterization, check UA  Interim History / Subjective:  -Pt underwent thrombectomy yesterday, post-op requiring Dopamine -This morning continues to require Dopamine ~ PCCM consulted for asssitance with hypotension and vasopressors -Noted to be tachycardic in 130's ~ will give 1L fluid bolus to assess fluid responsiveness, transition to Levophed given tachycardia, and start Midodrine -Pt's only complaint is pain/discomfort to lower abdominal/pelvic region ~ noted to be unable to void and retaining over 500 cc of urine ~ plan for I&O catheterization and obtain UA -Otherwise denies chest pain,  SOB, N/V/D  Objective   Blood pressure 94/61, pulse (!) 118, temperature 98.5 F (36.9 C), temperature source Axillary, resp. rate (!) 22, height '5\' 10"'$  (1.778 m), weight 96.8 kg, SpO2 (!) 87 %.        Intake/Output Summary (Last 24 hours) at 10/28/2022  1109 Last data filed at 10/28/2022 1050 Gross per 24 hour  Intake 3426.86 ml  Output 4425 ml  Net -998.14 ml   Filed Weights   10/25/22 2145  Weight: 96.8 kg    Examination: General: Acutely ill-appearing male, laying in bed, on 2 L nasal cannula, no acute distress HENT: Atraumatic, normocephalic, neck supple, no JVD Lungs: Clear breath sounds bilaterally, even, nonlabored Cardiovascular: Tachycardia, regular rhythm, S1-S2, no murmurs, rubs, gallops Abdomen: Obese, soft, tender to lower quadrants, bowel sounds positive x 4 Extremities: Normal bulk and tone, right lower extremity is wrapped in bandage Neuro: Awake and alert, oriented x 4, moves all extremities to command, no focal deficits, speech clear, pupils PERRLA GU: Currently unable to void and retaining urine on bladder scan  Resolved Hospital Problem list     Assessment & Plan:   #Hypotension: Suspect hypovolemia #Tachycardia ~suspect related to dopamine PMHx: HTN, HLD -Continuous cardiac monitoring -Maintain MAP >65 -IV fluids ~will give 1 L fluid bolus to assess for fluid responsiveness -Vasopressors as needed to maintain MAP goal ~ transition to Levophed from dopamine due to tachycardia -Start midodrine -Trend lactic acid until normalized -Consider echocardiogram   #Extensive Right lower extremity and IVC DVT PMHx: Pulmonary Embolism S/p angiogram, catheter directed thrombolysis and mechanical thrombectomy on 12/7 CTA 10/06/22 was negative for PE -Vascular surgery following, appreciate input -Site assessment as per Vascular  #Chronic ILD without acute exacerbation -Supplemental O2 as needed to maintain O2 sats >88% -Follow intermittent Chest X-ray & ABG as needed -Bronchodilators as needed -Pulmonary toilet as able  #Normocytic Normochromic Anemia -Monitor for S/Sx of bleeding -Trend CBC -SCD's for VTE Prophylaxis  ~ can resume anticoagulation when approved by Vascular Surgery -Transfuse for Hgb  <8     Best Practice (right click and "Reselect all SmartList Selections" daily)   Diet/type: Regular consistency (see orders) DVT prophylaxis: SCD GI prophylaxis: N/A Lines: N/A Foley:  N/A Code Status:  full code Last date of multidisciplinary goals of care discussion [N/A]  12/8: Pt updated at bedside.  All questions answered.  Labs   CBC: Recent Labs  Lab 10/25/22 1144 10/26/22 0308 10/27/22 0414 10/27/22 1234 10/28/22 0323  WBC 6.3 4.1 4.2 12.6* 9.7  NEUTROABS 4.0  --   --   --   --   HGB 11.2* 10.0* 10.7* 11.1* 10.5*  HCT 37.0* 32.0* 34.4* 34.5* 33.4*  MCV 96.6 94.7 95.0 95.8 93.8  PLT 262 241 225 248 409    Basic Metabolic Panel: Recent Labs  Lab 10/25/22 1144 10/26/22 0308  NA 139 141  K 4.3 3.9  CL 113* 114*  CO2 17* 22  GLUCOSE 103* 101*  BUN 20 18  CREATININE 1.01 0.92  CALCIUM 9.1 8.6*   GFR: Estimated Creatinine Clearance: 84.7 mL/min (by C-G formula based on SCr of 0.92 mg/dL). Recent Labs  Lab 10/26/22 0308 10/27/22 0414 10/27/22 1234 10/28/22 0323  WBC 4.1 4.2 12.6* 9.7    Liver Function Tests: Recent Labs  Lab 10/25/22 1144  AST 14*  ALT 20  ALKPHOS 75  BILITOT 1.1  PROT 7.2  ALBUMIN 4.0   No results for input(s): "LIPASE", "AMYLASE" in the last 168 hours. No results  for input(s): "AMMONIA" in the last 168 hours.  ABG No results found for: "PHART", "PCO2ART", "PO2ART", "HCO3", "TCO2", "ACIDBASEDEF", "O2SAT"   Coagulation Profile: Recent Labs  Lab 10/25/22 2128  INR 1.5*    Cardiac Enzymes: No results for input(s): "CKTOTAL", "CKMB", "CKMBINDEX", "TROPONINI" in the last 168 hours.  HbA1C: Hgb A1c MFr Bld  Date/Time Value Ref Range Status  04/07/2022 04:18 AM 5.6 4.8 - 5.6 % Final    Comment:    (NOTE) Pre diabetes:          5.7%-6.4%  Diabetes:              >6.4%  Glycemic control for   <7.0% adults with diabetes     CBG: No results for input(s): "GLUCAP" in the last 168 hours.  Review of Systems:    Positives in BOLD: Gen: Denies fever, chills, weight change, fatigue, night sweats HEENT: Denies blurred vision, double vision, hearing loss, tinnitus, sinus congestion, rhinorrhea, sore throat, neck stiffness, dysphagia PULM: Denies shortness of breath, cough, sputum production, hemoptysis, wheezing CV: Denies chest pain, edema, orthopnea, paroxysmal nocturnal dyspnea, palpitations GI: Denies abdominal pain, nausea, vomiting, diarrhea, hematochezia, melena, constipation, change in bowel habits GU: Denies dysuria, hematuria, polyuria, oliguria, urethral discharge Endocrine: Denies hot or cold intolerance, polyuria, polyphagia or appetite change Derm: Denies rash, dry skin, scaling or peeling skin change Heme: Denies easy bruising, bleeding, bleeding gums Neuro: Denies headache, numbness, weakness, slurred speech, loss of memory or consciousness   Past Medical History:  He,  has a past medical history of Arthritis, Back pain, Colovesical fistula (09/06/2019), Hyperlipidemia, and Hypertension.   Surgical History:   Past Surgical History:  Procedure Laterality Date   BACK SURGERY     COLONOSCOPY WITH PROPOFOL N/A 11/05/2019   Procedure: COLONOSCOPY WITH PROPOFOL;  Surgeon: [name], MD;  Location: [facility] ENDOSCOPY;  Service: Gastroenterology;  Laterality: N/A;   PARTIAL COLECTOMY N/A 02/11/2020   Procedure: PARTIAL COLECTOMY -- sigmoid;  Surgeon: Olean Ree, MD;  Location: [facility] ORS;  Service: General;  Laterality: N/A;   PERIPHERAL VASCULAR THROMBECTOMY Right 10/07/2022   Procedure: PERIPHERAL VASCULAR THROMBECTOMY;  Surgeon: Algernon Huxley, MD;  Location: [place] [facility] LAB;  Service: Cardiovascular;  Laterality: Right;   PERIPHERAL VASCULAR THROMBECTOMY N/A 10/27/2022   Procedure: PERIPHERAL VASCULAR THROMBECTOMY;  Surgeon: Algernon Huxley, MD;  Location: [place] [facility] LAB;  Service: Cardiovascular;  Laterality: N/A;   SPINE SURGERY     TAKE DOWN OF INTESTINAL FISTULA N/A 02/11/2020    Procedure: TAKE DOWN OF INTESTINAL FISTULA -- colovesical fistula;  Surgeon: Olean Ree, MD;  Location: [facility] ORS;  Service: General;  Laterality: N/A;   TRACHEAL SURGERY       Social History:   reports that he has never smoked. He has never used smokeless tobacco. He reports that he does not drink alcohol and does not use drugs.   Family History:  His family history is negative for Prostate cancer, Bladder Cancer, and Kidney cancer.   Allergies Allergies  Allergen Reactions   Penicillins Swelling   Shrimp [Shellfish Allergy] Hives   Cisatracurium Other (See Comments)    Other reaction(s): Unknown Other reaction(s): Unknown    Heparin Hives and Swelling    Patient's daughter reports patient had swelling/hives during hospital admission in [place]. They were on several medications at once so she is unsure whether it was VANCOMYCIN that caused this reaction or another concomitant medication.    Rocuronium Other (See Comments)    Other reaction(s):  Unknown Other reaction(s): Unknown    Vancomycin Hives and Swelling    Patient's daughter reports patient had swelling/hives during hospital admission in [place]. They were on several medications at once so she is unsure whether it was VANCOMYCIN that caused this reaction or another concomitant medication.    Wound Dressings Rash    Tegaderm      Home Medications  Prior to Admission medications   Medication Sig Start Date End Date Taking? Authorizing Provider  apixaban (ELIQUIS) 5 MG TABS tablet Take 1 tablet (5 mg total) by mouth 2 (two) times daily. 10/15/22  Yes Jennye Boroughs, MD  doxycycline (VIBRAMYCIN) 100 MG capsule Take 100 mg by mouth daily. 10/17/22  Yes [provider]  gabapentin (NEURONTIN) 300 MG capsule Take 300 mg by mouth 3 (three) times daily.   Yes [provider]  HYDROcodone-acetaminophen (NORCO/VICODIN) 5-325 MG tablet Take 1 tablet by mouth every 6 (six) hours as needed for moderate pain. Patient  not taking: Reported on 10/25/2022 09/22/22 09/22/23  Letitia Neri L, PA-C  lidocaine (XYLOCAINE) 5 % ointment Apply 1 Application topically 3 (three) times daily as needed for mild pain. 09/28/22   [provider]  methocarbamol (ROBAXIN) 750 MG tablet Take 750 mg by mouth every 8 (eight) hours as needed for muscle spasms. Patient not taking: Reported on 10/25/2022    [provider]  ondansetron (ZOFRAN) 4 MG tablet Take 4 mg by mouth every 8 (eight) hours as needed. Patient not taking: Reported on 10/25/2022 09/28/22   [provider]  ondansetron (ZOFRAN-ODT) 4 MG disintegrating tablet Take 1 tablet (4 mg total) by mouth every 8 (eight) hours as needed for nausea or vomiting. Patient not taking: Reported on 10/25/2022 06/11/22   Duffy Bruce, MD  triamcinolone lotion (KENALOG) 0.1 % Apply 1 Application topically 3 (three) times daily. Patient not taking: Reported on 10/25/2022    [provider]     Critical care time: 45 minutes     Darel Hong, AGACNP-BC [facility] epic messenger for cross cover needs If after hours, please call E-link

## 2022-10-28 NOTE — Progress Notes (Signed)
[facility]  Daily Progress Note   Subjective  - POD # 1 S/P IVC gram and right lower extremity venogram with Catheter directed thrombolysis with 8 mg of tPA to the iliac and common femoral veins and Mechanical thrombectomy to right common femoral vein, iliac veins, and IVC with PTA of the common femoral vein and distal external iliac vein   Russell [name] is a 71 y.o. 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     Objective Vitals:   10/28/22 1555 10/28/22 1600 10/28/22 1615 10/28/22 1630  BP: (!) 95/57 100/62 98/67 108/67  Pulse: 88 82 81 87  Resp: (!) 27 (!) 21 (!) 27 (!) 27  Temp:      TempSrc:      SpO2: 97% 95% 95% 91%  Weight:      Height:        Intake/Output Summary (Last 24 hours) at 10/28/2022 1709 Last data filed at 10/28/2022 1559 Gross per 24 hour  Intake 5032.46 ml  Output 4650 ml  Net 382.46 ml    PULM  CTAB CV  RRR VASC  Palpable pulse to bilateral lower extremities. Both lower extremities are warm to the touch.   Laboratory CBC    Component Value Date/Time   WBC 9.7 10/28/2022 0323   HGB 10.5 (L) 10/28/2022 0323   HGB 14.9 10/14/2019 1424   HCT 33.4 (L) 10/28/2022 0323   HCT 45.9 10/14/2019 1424   PLT 229 10/28/2022 0323   PLT 208 10/14/2019 1424    BMET    Component Value Date/Time   NA 140 10/28/2022 1221   NA 143 10/14/2019 1424   K 3.7 10/28/2022 1221   CL 113 (H) 10/28/2022 1221   CO2 20 (L) 10/28/2022 1221   GLUCOSE 132 (H) 10/28/2022 1221   BUN 16 10/28/2022 1221   BUN 16 10/14/2019 1424   CREATININE 0.89 10/28/2022 1221   CALCIUM 8.3 (L) 10/28/2022 1221   GFRNONAA >60 10/28/2022 1221   GFRAA >60 05/03/2020 0604    Assessment/Planning: POD #1   S/P IVC gram and right lower extremity venogram with Catheter directed thrombolysis with 8 mg  of tPA to the iliac and common femoral veins and Mechanical thrombectomy to right common femoral vein, iliac veins, and IVC with PTA of the common femoral vein and distal external iliac vein   Patient is recovering as expected with palpable pulses DP and PT bilaterally. Both lower extremities are warm to the touch. Behind the right knee insertion site is without hematoma, seroma. Dressing remains dry and intact.  Patients blood pressures remain soft. Primary team is treating.   [name]  10/28/2022, 5:09 PM

## 2022-10-28 NOTE — Progress Notes (Signed)
An USGPIV (ultrasound guided PIV) has been placed for short-term vasopressor infusion. A correctly placed ivWatch must be used when administering Vasopressors. Should this treatment be needed beyond 72 hours, central line access should be obtained.  It will be the responsibility of the bedside nurse to follow best practice to prevent extravasations.   ?

## 2022-10-28 NOTE — Progress Notes (Signed)
Upon follow up on patient, nursing reports concern that IV where vasopressors infusing may not be good (SBP dropped into 80's).  Infusion changed to different site and now BP improving.  Lung sounds remain clear and pt on room air, will give additional 500 cc NS bolus.  Given persistent shock, will order Cortisol, troponin, and Echocardiogram to try and identify etiology of shock.    Darel Hong, AGACNP-BC [facility] Prefer epic messenger for cross cover needs If after hours, please call E-link

## 2022-10-28 NOTE — Progress Notes (Signed)
Progress Note    Russell [name]  TGY:[id number] DOB: 06-12-1951  DOA: 10/25/2022 PCP: Norm Parcel, MD      Brief Narrative:    Medical records reviewed and are as summarized below:  Russell [name] is a 71 y.o. male with medical history significant of PE and DVT, HTN, HLD, anemia, ILD, who present with right leg pain and swelling.         Assessment/Plan:   Principal Problem:   DVT, recurrent, lower extremity, acute, right (HCC) Active Problems:   History of pulmonary embolism   ILD (interstitial lung disease) (HCC)   Hypotension   Normocytic anemia   HTN (hypertension)   Dyslipidemia    Body mass index is 30.62 kg/m.  (Obesity)  Recurrent acute right lower extremity DVT: S/p catheter directed thrombolysis to the right iliac and common femoral vein, mechanical thrombectomy right common femoral, iliac veins and IVC on 10/27/2022. History of recent thrombectomy to right common femoral, external iliac, common iliac veins and inferior vena cava on 10/07/2022. Follow-up with vascular surgeon for further recommendations  Refractory hypotension s/p thrombectomy: Consulted intensivist, Dr. Mortimer Fries, to assist with management.  IV dopamine has been switched to IV Levophed to infusion.  1 L of Ringer's lactate bolus was given.  Continue normal saline infusion.  H&H is stable.  History of pulmonary embolism: CTA on 10/06/2022 was negative for PE.  Interstitial lung disease: No acute issues  Other comorbidities include normocytic anemia, hypertension, dyslipidemia   Diet Order             Diet regular Room service appropriate? Yes; Fluid consistency: Thin  Diet effective now                            Consultants: Vascular surgeon  Procedures: S/p right common femoral, iliac vein and IVC thrombectomy on 10/27/2022   Medications:    Chlorhexidine Gluconate Cloth  6 each Topical Daily   gabapentin  300 mg Oral TID   methylPREDNISolone  (SOLU-MEDROL) injection  125 mg Intravenous Once   midodrine  10 mg Oral TID WC   Continuous Infusions:  sodium chloride Stopped (10/28/22 1123)   norepinephrine (LEVOPHED) Adult infusion 6 mcg/min (10/28/22 1131)     Anti-infectives (From admission, onward)    Start     Dose/Rate Route Frequency Ordered Stop   10/27/22 1033  ciprofloxacin (CIPRO) 400 MG/200ML IVPB       Note to Pharmacy: Corlis Hove H: cabinet override      10/27/22 1033 10/27/22 1826   10/27/22 1030  ciprofloxacin (CIPRO) IVPB 400 mg        400 mg 200 mL/hr over 60 Minutes Intravenous  Once 10/27/22 1028 10/27/22 1135   10/27/22 0949  ceFAZolin (ANCEF) 2-4 GM/100ML-% IVPB       Note to Pharmacy: Delametter, Gretchen: cabinet override      10/27/22 0949 10/27/22 1826   10/27/22 0615  ceFAZolin (ANCEF) IVPB 2g/100 mL premix  Status:  Discontinued        2 g 200 mL/hr over 30 Minutes Intravenous 30 min pre-op 10/27/22 0615 10/27/22 1034              Family Communication/Anticipated D/C date and plan/Code Status   DVT prophylaxis:      Code Status: Full Code  Family Communication: None Disposition Plan: Plan to discharge home when medically stable   Status is: Inpatient Remains inpatient appropriate because: Needs right  lower extremity thrombectomy       Subjective:   Interval events noted.  He has no complaints.  No shortness of breath, chest pain, headache, lower extremity pain.  Swelling in right leg is improving.  Objective:    Vitals:   10/28/22 1100 10/28/22 1127 10/28/22 1130 10/28/22 1145  BP: 128/89 (!) 77/53 (!) 93/58 (!) 92/59  Pulse: (!) 118 (!) 128 98 80  Resp: (!) 23 (!) 24 (!) 23 (!) 21  Temp:      TempSrc:      SpO2: 92% 100% 92% 94%  Weight:      Height:       No data found.   Intake/Output Summary (Last 24 hours) at 10/28/2022 1219 Last data filed at 10/28/2022 1131 Gross per 24 hour  Intake 3600.94 ml  Output 4575 ml  Net -974.06 ml   Filed Weights    10/25/22 2145  Weight: 96.8 kg    Exam:   GEN: NAD SKIN: No rash EYES: EOMI ENT: MMM CV: RRR PULM: CTA B ABD: soft, ND, NT, +BS CNS: AAO x 3, non focal EXT: Right lower extremity swelling is improving.  No tenderness    Data Reviewed:   I have personally reviewed following labs and imaging studies:  Labs: Labs show the following:   Basic Metabolic Panel: Recent Labs  Lab 10/25/22 1144 10/26/22 0308  NA 139 141  K 4.3 3.9  CL 113* 114*  CO2 17* 22  GLUCOSE 103* 101*  BUN 20 18  CREATININE 1.01 0.92  CALCIUM 9.1 8.6*   GFR Estimated Creatinine Clearance: 84.7 mL/min (by C-G formula based on SCr of 0.92 mg/dL). Liver Function Tests: Recent Labs  Lab 10/25/22 1144  AST 14*  ALT 20  ALKPHOS 75  BILITOT 1.1  PROT 7.2  ALBUMIN 4.0   No results for input(s): "LIPASE", "AMYLASE" in the last 168 hours. No results for input(s): "AMMONIA" in the last 168 hours. Coagulation profile Recent Labs  Lab 10/25/22 2128  INR 1.5*    CBC: Recent Labs  Lab 10/25/22 1144 10/26/22 0308 10/27/22 0414 10/27/22 1234 10/28/22 0323  WBC 6.3 4.1 4.2 12.6* 9.7  NEUTROABS 4.0  --   --   --   --   HGB 11.2* 10.0* 10.7* 11.1* 10.5*  HCT 37.0* 32.0* 34.4* 34.5* 33.4*  MCV 96.6 94.7 95.0 95.8 93.8  PLT 262 241 225 248 229   Cardiac Enzymes: No results for input(s): "CKTOTAL", "CKMB", "CKMBINDEX", "TROPONINI" in the last 168 hours. BNP (last 3 results) No results for input(s): "PROBNP" in the last 8760 hours. CBG: No results for input(s): "GLUCAP" in the last 168 hours. D-Dimer: No results for input(s): "DDIMER" in the last 72 hours. Hgb A1c: No results for input(s): "HGBA1C" in the last 72 hours. Lipid Profile: No results for input(s): "CHOL", "HDL", "LDLCALC", "TRIG", "CHOLHDL", "LDLDIRECT" in the last 72 hours. Thyroid function studies: No results for input(s): "TSH", "T4TOTAL", "T3FREE", "THYROIDAB" in the last 72 hours.  Invalid input(s): "FREET3" Anemia  work up: No results for input(s): "VITAMINB12", "FOLATE", "FERRITIN", "TIBC", "IRON", "RETICCTPCT" in the last 72 hours. Sepsis Labs: Recent Labs  Lab 10/26/22 0308 10/27/22 0414 10/27/22 1234 10/28/22 0323  WBC 4.1 4.2 12.6* 9.7    Microbiology Recent Results (from the past 240 hour(s))  Resp Panel by RT-PCR (Flu A&B, Covid) Anterior Nasal Swab     Status: None   Collection Time: 10/25/22 11:44 AM   Specimen: Anterior Nasal Swab  Result Value  Ref Range Status   SARS Coronavirus 2 by RT PCR NEGATIVE NEGATIVE Final    Comment: (NOTE) SARS-CoV-2 target nucleic acids are NOT DETECTED.  The SARS-CoV-2 RNA is generally detectable in upper respiratory specimens during the acute phase of infection. The lowest concentration of SARS-CoV-2 viral copies this assay can detect is 138 copies/mL. A negative result does not preclude SARS-Cov-2 infection and should not be used as the sole basis for treatment or other patient management decisions. A negative result may occur with  improper specimen collection/handling, submission of specimen other than nasopharyngeal swab, presence of viral mutation(s) within the areas targeted by this assay, and inadequate number of viral copies(<138 copies/mL). A negative result must be combined with clinical observations, patient history, and epidemiological information. The expected result is Negative.  Fact Sheet for Patients:  EntrepreneurPulse.com.au  Fact Sheet for Healthcare Providers:  IncredibleEmployment.be  This test is no t yet approved or cleared by the [place] FDA and  has been authorized for detection and/or diagnosis of SARS-CoV-2 by FDA under an Emergency Use Authorization (EUA). This EUA will remain  in effect (meaning this test can be used) for the duration of the COVID-19 declaration under Section 564(b)(1) of the Act, 21 U.S.C.section 360bbb-3(b)(1), unless the authorization is terminated   or revoked sooner.       Influenza A by PCR NEGATIVE NEGATIVE Final   Influenza B by PCR NEGATIVE NEGATIVE Final    Comment: (NOTE) The Xpert Xpress SARS-CoV-2/FLU/RSV plus assay is intended as an aid in the diagnosis of influenza from Nasopharyngeal swab specimens and should not be used as a sole basis for treatment. Nasal washings and aspirates are unacceptable for Xpert Xpress SARS-CoV-2/FLU/RSV testing.  Fact Sheet for Patients: EntrepreneurPulse.com.au  Fact Sheet for Healthcare Providers: IncredibleEmployment.be  This test is not yet approved or cleared by the [place] FDA and has been authorized for detection and/or diagnosis of SARS-CoV-2 by FDA under an Emergency Use Authorization (EUA). This EUA will remain in effect (meaning this test can be used) for the duration of the COVID-19 declaration under Section 564(b)(1) of the Act, 21 U.S.C. section 360bbb-3(b)(1), unless the authorization is terminated or revoked.  Performed at [facility], [address] [place], [place] [address]     Procedures and diagnostic studies:  PERIPHERAL VASCULAR CATHETERIZATION  Result Date: 10/27/2022 See surgical note for result.              LOS: 3 days   Kaylib Furness  [facility]   Pager on [email or web address]. If 7PM-7AM, please contact night-coverage at [email or web address]     10/28/2022, 12:19 PM

## 2022-10-29 ENCOUNTER — Inpatient Hospital Stay (HOSPITAL_COMMUNITY)
Admission: EM | Admit: 2022-10-29 | Discharge: 2022-10-29 | Disposition: A | Discharge status: Home / Self Care | Payer: Medicare Other | Source: Home / Self Care | Attending: Pulmonary Disease | Admitting: Pulmonary Disease

## 2022-10-29 DIAGNOSIS — I10 Essential (primary) hypertension: Secondary | ICD-10-CM

## 2022-10-29 DIAGNOSIS — I9589 Other hypotension: Secondary | ICD-10-CM | POA: Diagnosis not present

## 2022-10-29 DIAGNOSIS — E861 Hypovolemia: Secondary | ICD-10-CM | POA: Diagnosis not present

## 2022-10-29 DIAGNOSIS — R578 Other shock: Secondary | ICD-10-CM

## 2022-10-29 DIAGNOSIS — I82401 Acute embolism and thrombosis of unspecified deep veins of right lower extremity: Secondary | ICD-10-CM | POA: Diagnosis not present

## 2022-10-29 DIAGNOSIS — J849 Interstitial pulmonary disease, unspecified: Secondary | ICD-10-CM | POA: Diagnosis not present

## 2022-10-29 LAB — BASIC METABOLIC PANEL
Anion gap: 4 — ABNORMAL LOW (ref 5–15)
BUN: 23 mg/dL (ref 8–23)
CO2: 24 mmol/L (ref 22–32)
Calcium: 8.4 mg/dL — ABNORMAL LOW (ref 8.9–10.3)
Chloride: 114 mmol/L — ABNORMAL HIGH (ref 98–111)
Creatinine, Ser: 0.92 mg/dL (ref 0.61–1.24)
GFR, Estimated: >60 mL/min (ref >60)
Glucose, Bld: 124 mg/dL — ABNORMAL HIGH (ref 70–99)
Potassium: 3.9 mmol/L (ref 3.5–5.1)
Sodium: 142 mmol/L (ref 135–145)

## 2022-10-29 LAB — CBC
HCT: 26.7 % — ABNORMAL LOW (ref 39.0–52.0)
Hemoglobin: 8.3 g/dL — ABNORMAL LOW (ref 13.0–17.0)
MCH: 29.9 pg (ref 26.0–34.0)
MCHC: 31.1 g/dL (ref 30.0–36.0)
MCV: 96 fL (ref 80.0–100.0)
Platelets: 182 10*3/uL (ref 150–400)
RBC: 2.78 MIL/uL — ABNORMAL LOW (ref 4.22–5.81)
RDW: 14.6 % (ref 11.5–15.5)
WBC: 7.3 10*3/uL (ref 4.0–10.5)
nRBC: 0 % (ref 0.0–0.2)

## 2022-10-29 LAB — ECHOCARDIOGRAM COMPLETE
AR max vel: 2.47 cm2
AV Area VTI: 2.89 cm2
AV Area mean vel: 2.61 cm2
AV Mean grad: 7 mmHg
AV Peak grad: 14.1 mmHg
Ao pk vel: 1.88 m/s
Area-P 1/2: 3.54 cm2
Calc EF: 63.3 %
Height: 70 in
S' Lateral: 2.9 cm
Single Plane A2C EF: 55.9 %
Single Plane A4C EF: 67.9 %
Weight: 3414.48 [oz_av]

## 2022-10-29 LAB — HEMOGLOBIN AND HEMATOCRIT, BLOOD
HCT: 27.4 % — ABNORMAL LOW (ref 39.0–52.0)
Hemoglobin: 8.7 g/dL — ABNORMAL LOW (ref 13.0–17.0)

## 2022-10-29 MED ORDER — DOXYCYCLINE HYCLATE 100 MG PO TABS
100.0000 mg | ORAL_TABLET | Freq: Every day | ORAL | Status: DC
  Administered 2022-10-29 – 2022-10-31 (×3): 100 mg via ORAL
  Filled 2022-10-29 (×4): qty 1

## 2022-10-29 MED ORDER — HYDROCORTISONE SOD SUC (PF) 100 MG IJ SOLR
50.0000 mg | Freq: Three times a day (TID) | INTRAMUSCULAR | Status: DC
  Administered 2022-10-29 – 2022-10-30 (×2): 50 mg via INTRAVENOUS
  Filled 2022-10-29 (×3): qty 1

## 2022-10-29 NOTE — Progress Notes (Signed)
*  PRELIMINARY RESULTS* Echocardiogram 2D Echocardiogram has been performed.  Russell [name] 10/29/2022, 9:20 AM

## 2022-10-29 NOTE — Progress Notes (Addendum)
Progress Note    Russell [name]  [id number] DOB: 1951/07/14  DOA: 10/25/2022 PCP: Norm Parcel, MD      Brief Narrative:    Medical records reviewed and are as summarized below:  Russell [name] is a 71 y.o. 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         Assessment/Plan:   Principal Problem:   DVT, recurrent, lower extremity, acute, right (HCC) Active Problems:   History of pulmonary embolism   ILD (interstitial lung disease) (HCC)   Hypotension   Normocytic anemia   HTN (hypertension)   Dyslipidemia    Body mass index is 30.62 kg/m.  (Obesity)  Recurrent acute right lower extremity DVT: S/p catheter directed thrombolysis to the right iliac and common femoral vein, mechanical thrombectomy right common femoral, iliac veins and IVC on 10/27/2022. History of recent thrombectomy to right common femoral, external iliac, common iliac veins and inferior vena cava on 10/07/2022. He has been restarted on Eliquis.  Refractory hypotension s/p thrombectomy: Resolved.  He has been weaned off of Levophed.  Adrenal insufficiency: Cortisol level was 2.2. Check ACTH level. Continue IV Hydrocortisone  Acute blood loss anemia probably due to recent thrombectomy: Hemoglobin on admission was 11.2.  Hemoglobin was 8.3 earlier this morning.  Repeat hemoglobin today is 8.7.  Monitor H&H.  Transient hematuria: Resolved.  This was probably due to recent in and out urethral catheterization (on 10/28/2022) for acute urinary retention.  History of pulmonary embolism: CTA on 10/06/2022 was negative for PE.  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  Other comorbidities include normocytic anemia, hypertension, dyslipidemia  Transfer from stepdown unit to [place] regular Room service appropriate? Yes; Fluid consistency: Thin  Diet effective now                            Consultants: Vascular surgeon  Procedures: S/p right common femoral, iliac vein and IVC thrombectomy on 10/27/2022   Medications:    apixaban  10 mg Oral BID   Followed by   Derrill Memo ON 11/04/2022] apixaban  5 mg Oral BID   Chlorhexidine Gluconate Cloth  6 each Topical Daily   gabapentin  300 mg Oral TID   methylPREDNISolone (SOLU-MEDROL) injection  125 mg Intravenous Once   midodrine  10 mg Oral TID WC   Continuous Infusions:  sodium chloride Stopped (10/28/22 1953)   norepinephrine (LEVOPHED) Adult infusion Stopped (10/28/22 2204)     Anti-infectives (From admission, onward)    Start     Dose/Rate Route Frequency Ordered Stop   10/27/22 1033  ciprofloxacin (CIPRO) 400 MG/200ML IVPB       Note to Pharmacy: Corlis Hove H: cabinet override      10/27/22 1033 10/27/22 1826   10/27/22 1030  ciprofloxacin (CIPRO) IVPB 400 mg        400 mg 200 mL/hr over 60 Minutes Intravenous  Once 10/27/22 1028 10/27/22 1135   10/27/22 0949  ceFAZolin (ANCEF) 2-4 GM/100ML-% IVPB       Note to Pharmacy: Delametter, Elzie Rings: cabinet override  10/27/22 0949 10/27/22 1826   10/27/22 0615  ceFAZolin (ANCEF) IVPB 2g/100 mL premix  Status:  Discontinued        2 g 200 mL/hr over 30 Minutes Intravenous 30 min pre-op 10/27/22 0615 10/27/22 1034              Family Communication/Anticipated D/C date and plan/Code Status   DVT prophylaxis:  apixaban (ELIQUIS) tablet 10 mg  apixaban (ELIQUIS) tablet 5 mg     Code Status: Full Code  Family Communication: None Disposition Plan: Plan to discharge home when medically stable   Status is: Inpatient Remains inpatient appropriate because: Needs right  lower extremity thrombectomy       Subjective:   Interval events noted.  He had bloody urine earlier this morning according to [place], Therapist, sports. He had in and out cath yesterday for retention of urine  Objective:    Vitals:   10/29/22 0300 10/29/22 0400 10/29/22 0500 10/29/22 0600  BP: 97/64 102/62 102/66   Pulse: 74 61 77 70  Resp: (!) '21 18 20 '$ (!) 22  Temp:  98.1 F (36.7 C)    TempSrc:  Oral    SpO2: 97% 96% 94% (!) 87%  Weight:      Height:       No data found.   Intake/Output Summary (Last 24 hours) at 10/29/2022 1016 Last data filed at 10/29/2022 1194 Gross per 24 hour  Intake 3413.57 ml  Output 3000 ml  Net 413.57 ml   Filed Weights   10/25/22 2145  Weight: 96.8 kg    Exam:  GEN: NAD SKIN: Warm and dry EYES: No pallor or icterus ENT: MMM CV: RRR PULM: CTA B ABD: soft, ND, NT, +BS CNS: AAO x 3, non focal EXT: Right lower extremity swelling is better.  No erythema or tenderness GU: No Foley catheter in place.  Urine bottle at the bedside has clear and amber urine.   Data Reviewed:   I have personally reviewed following labs and imaging studies:  Labs: Labs show the following:   Basic Metabolic Panel: Recent Labs  Lab 10/25/22 1144 10/26/22 0308 10/28/22 1221 10/29/22 0412  NA 139 141 140 142  K 4.3 3.9 3.7 3.9  CL 113* 114* 113* 114*  CO2 17* 22 20* 24  GLUCOSE 103* 101* 132* 124*  BUN '20 18 16 23  '$ CREATININE 1.01 0.92 0.89 0.92  CALCIUM 9.1 8.6* 8.3* 8.4*   GFR Estimated Creatinine Clearance: 84.7 mL/min (by C-G formula based on SCr of 0.92 mg/dL). Liver Function Tests: Recent Labs  Lab 10/25/22 1144  AST 14*  ALT 20  ALKPHOS 75  BILITOT 1.1  PROT 7.2  ALBUMIN 4.0   No results for input(s): "LIPASE", "AMYLASE" in the last 168 hours. No results for input(s): "AMMONIA" in the last 168 hours. Coagulation profile Recent Labs  Lab 10/25/22 2128  INR 1.5*    CBC: Recent Labs  Lab 10/25/22 1144 10/26/22 0308  10/27/22 0414 10/27/22 1234 10/28/22 0323 10/29/22 0412  WBC 6.3 4.1 4.2 12.6* 9.7 7.3  NEUTROABS 4.0  --   --   --   --   --   HGB 11.2* 10.0* 10.7* 11.1* 10.5* 8.3*  HCT 37.0* 32.0* 34.4* 34.5* 33.4* 26.7*  MCV 96.6 94.7 95.0 95.8 93.8 96.0  PLT 262 241 225 248 229 182   Cardiac Enzymes: No results for input(s): "CKTOTAL", "CKMB", "CKMBINDEX", "TROPONINI" in the last 168 hours. BNP (last 3 results) No results for input(s): "PROBNP" in  the last 8760 hours. CBG: No results for input(s): "GLUCAP" in the last 168 hours. D-Dimer: No results for input(s): "DDIMER" in the last 72 hours. Hgb A1c: No results for input(s): "HGBA1C" in the last 72 hours. Lipid Profile: No results for input(s): "CHOL", "HDL", "LDLCALC", "TRIG", "CHOLHDL", "LDLDIRECT" in the last 72 hours. Thyroid function studies: No results for input(s): "TSH", "T4TOTAL", "T3FREE", "THYROIDAB" in the last 72 hours.  Invalid input(s): "FREET3" Anemia work up: No results for input(s): "VITAMINB12", "FOLATE", "FERRITIN", "TIBC", "IRON", "RETICCTPCT" in the last 72 hours. Sepsis Labs: Recent Labs  Lab 10/27/22 0414 10/27/22 1234 10/28/22 0323 10/28/22 1221 10/28/22 1421 10/29/22 0412  WBC 4.2 12.6* 9.7  --   --  7.3  LATICACIDVEN  --   --   --  1.6 1.3  --     Microbiology Recent Results (from the past 240 hour(s))  Resp Panel by RT-PCR (Flu A&B, Covid) Anterior Nasal Swab     Status: None   Collection Time: 10/25/22 11:44 AM   Specimen: Anterior Nasal Swab  Result Value Ref Range Status   SARS Coronavirus 2 by RT PCR NEGATIVE NEGATIVE Final    Comment: (NOTE) SARS-CoV-2 target nucleic acids are NOT DETECTED.  The SARS-CoV-2 RNA is generally detectable in upper respiratory specimens during the acute phase of infection. The lowest concentration of SARS-CoV-2 viral copies this assay can detect is 138 copies/mL. A negative result does not preclude SARS-Cov-2 infection and should not be used as the sole basis  for treatment or other patient management decisions. A negative result may occur with  improper specimen collection/handling, submission of specimen other than nasopharyngeal swab, presence of viral mutation(s) within the areas targeted by this assay, and inadequate number of viral copies(<138 copies/mL). A negative result must be combined with clinical observations, patient history, and epidemiological information. The expected result is Negative.  Fact Sheet for Patients:  EntrepreneurPulse.com.au  Fact Sheet for Healthcare Providers:  IncredibleEmployment.be  This test is no t yet approved or cleared by the [place] FDA and  has been authorized for detection and/or diagnosis of SARS-CoV-2 by FDA under an Emergency Use Authorization (EUA). This EUA will remain  in effect (meaning this test can be used) for the duration of the COVID-19 declaration under Section 564(b)(1) of the Act, 21 U.S.C.section 360bbb-3(b)(1), unless the authorization is terminated  or revoked sooner.       Influenza A by PCR NEGATIVE NEGATIVE Final   Influenza B by PCR NEGATIVE NEGATIVE Final    Comment: (NOTE) The Xpert Xpress SARS-CoV-2/FLU/RSV plus assay is intended as an aid in the diagnosis of influenza from Nasopharyngeal swab specimens and should not be used as a sole basis for treatment. Nasal washings and aspirates are unacceptable for Xpert Xpress SARS-CoV-2/FLU/RSV testing.  Fact Sheet for Patients: EntrepreneurPulse.com.au  Fact Sheet for Healthcare Providers: IncredibleEmployment.be  This test is not yet approved or cleared by the [place] FDA and has been authorized for detection and/or diagnosis of SARS-CoV-2 by FDA under an Emergency Use Authorization (EUA). This EUA will remain in effect (meaning this test can be used) for the duration of the COVID-19 declaration under Section 564(b)(1) of the Act, 21  U.S.C. section 360bbb-3(b)(1), unless the authorization is terminated or revoked.  Performed at [facility], [address] [place], [place] [address]     Procedures and diagnostic studies:  PERIPHERAL VASCULAR CATHETERIZATION  Result Date: 10/27/2022 See surgical note for result.  LOS: 4 days   Dilan Fullenwider  [facility]   Pager on [email or web address]. If 7PM-7AM, please contact night-coverage at [email or web address]     10/29/2022, 10:16 AM

## 2022-10-30 DIAGNOSIS — I82401 Acute embolism and thrombosis of unspecified deep veins of right lower extremity: Secondary | ICD-10-CM | POA: Diagnosis not present

## 2022-10-30 LAB — BASIC METABOLIC PANEL
Anion gap: 3 — ABNORMAL LOW (ref 5–15)
BUN: 20 mg/dL (ref 8–23)
CO2: 24 mmol/L (ref 22–32)
Calcium: 8.3 mg/dL — ABNORMAL LOW (ref 8.9–10.3)
Chloride: 115 mmol/L — ABNORMAL HIGH (ref 98–111)
Creatinine, Ser: 0.86 mg/dL (ref 0.61–1.24)
GFR, Estimated: >60 mL/min (ref >60)
Glucose, Bld: 126 mg/dL — ABNORMAL HIGH (ref 70–99)
Potassium: 3.6 mmol/L (ref 3.5–5.1)
Sodium: 142 mmol/L (ref 135–145)

## 2022-10-30 LAB — CBC
HCT: 27.7 % — ABNORMAL LOW (ref 39.0–52.0)
Hemoglobin: 8.5 g/dL — ABNORMAL LOW (ref 13.0–17.0)
MCH: 29.7 pg (ref 26.0–34.0)
MCHC: 30.7 g/dL (ref 30.0–36.0)
MCV: 96.9 fL (ref 80.0–100.0)
Platelets: 175 10*3/uL (ref 150–400)
RBC: 2.86 MIL/uL — ABNORMAL LOW (ref 4.22–5.81)
RDW: 14.8 % (ref 11.5–15.5)
WBC: 5.7 10*3/uL (ref 4.0–10.5)
nRBC: 0 % (ref 0.0–0.2)

## 2022-10-30 MED ORDER — ORAL CARE MOUTH RINSE: 15.0000 mL | OROMUCOSAL | Status: DC | PRN

## 2022-10-30 NOTE — Plan of Care (Signed)
  Problem: Education: Goal: Knowledge of General Education information will improve Description: Including pain rating scale, medication(s)/side effects and non-pharmacologic comfort measures Outcome: Progressing   Problem: Health Behavior/Discharge Planning: Goal: Ability to manage health-related needs will improve Outcome: Progressing   Problem: Clinical Measurements: Goal: Diagnostic test results will improve Outcome: Progressing   Problem: Activity: Goal: Risk for activity intolerance will decrease Outcome: Progressing   Problem: Nutrition: Goal: Adequate nutrition will be maintained Outcome: Progressing   Problem: Elimination: Goal: Will not experience complications related to bowel motility Outcome: Progressing Goal: Will not experience complications related to urinary retention Outcome: Progressing   Problem: Pain Managment: Goal: General experience of comfort will improve Outcome: Progressing

## 2022-10-30 NOTE — Progress Notes (Addendum)
Progress Note    Russell [name]  WUJ:[id number] DOB: [birth year]  DOA: 10/25/2022 PCP: Norm Parcel, MD      Brief Narrative:    Medical records reviewed and are as summarized below:  Russell [name] is a 71 y.o. 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         Assessment/Plan:   Principal Problem:   DVT, recurrent, lower extremity, acute, right (HCC) Active Problems:   History of pulmonary embolism   ILD (interstitial lung disease) (HCC)   Hypotension   Normocytic anemia   HTN (hypertension)   Dyslipidemia    Body mass index is 30.62 kg/m.  (Obesity)  Recurrent acute right lower extremity DVT: S/p catheter directed thrombolysis to the right iliac and common femoral vein, mechanical thrombectomy right common femoral, iliac veins and IVC on 10/27/2022. History of recent thrombectomy to right common femoral, external iliac, common iliac veins and inferior vena cava on 10/07/2022. Continue Eliquis  Refractory hypotension s/p thrombectomy: Discontinue midodrine and monitor BP overnight  Adrenal insufficiency: Cortisol level was 2.2.  ACTH level is pending.  Discontinue hydrocortisone and monitor BP for now.  Reviewed blood pressure from previous visits and I did not see any trend to suggest that he has chronic hypotension.  Of note, patient had IV Solu-Medrol before cortisol level was obtained.  Unclear whether this has anything to do with the low cortisol level.  Acute blood loss anemia probably due to recent thrombectomy: H&H is stable.  No indication for blood transfusion.   Transient hematuria: Resolved.  This was probably due to recent in and out urethral catheterization (on 10/28/2022) for acute urinary retention.  History of pulmonary embolism: CTA on 10/06/2022 was negative for  PE.  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  Other comorbidities include normocytic anemia, hypertension, dyslipidemia  Plan discussed with Dawson Bills, his daughter, over the phone.   Diet Order             Diet regular Room service appropriate? Yes; Fluid consistency: Thin  Diet effective now                            Consultants: Vascular surgeon  Procedures: S/p right common femoral, iliac vein and IVC thrombectomy on 10/27/2022   Medications:    apixaban  10 mg Oral BID   Followed by   Derrill Memo ON 11/04/2022] apixaban  5 mg Oral BID   Chlorhexidine Gluconate Cloth  6 each Topical Daily   doxycycline  100 mg Oral Daily   gabapentin  300 mg Oral TID   Continuous Infusions:  sodium chloride Stopped (10/28/22 1953)     Anti-infectives (From admission, onward)    Start     Dose/Rate Route Frequency Ordered Stop   10/29/22 1230  doxycycline (VIBRA-TABS) tablet 100 mg        100 mg Oral Daily 10/29/22 1132     10/27/22 1033  ciprofloxacin (CIPRO) 400 MG/200ML IVPB       Note to Pharmacy: Corlis Hove H: cabinet override      10/27/22 1033 10/27/22 1826   10/27/22 1030  ciprofloxacin (CIPRO) IVPB 400 mg  400 mg 200 mL/hr over 60 Minutes Intravenous  Once 10/27/22 1028 10/27/22 1135   10/27/22 0949  ceFAZolin (ANCEF) 2-4 GM/100ML-% IVPB       Note to Pharmacy: Delametter, Gretchen: cabinet override      10/27/22 0949 10/27/22 1826   10/27/22 0615  ceFAZolin (ANCEF) IVPB 2g/100 mL premix  Status:  Discontinued        2 g 200 mL/hr over 30 Minutes Intravenous 30 min pre-op 10/27/22 0615 10/27/22 1034              Family Communication/Anticipated D/C date and plan/Code Status   DVT prophylaxis:  apixaban (ELIQUIS) tablet 10 mg  apixaban (ELIQUIS) tablet 5 mg     Code Status: Full Code  Family  Communication: Technical brewer, daughter Disposition Plan: Plan to discharge home tomorrow   Status is: Inpatient Remains inpatient appropriate because: Needs right lower extremity thrombectomy       Subjective:   He has no complaints.  He feels better today.  He says the swelling in the right lower extremity has improved significantly.  No pain.  Objective:    Vitals:   10/29/22 2357 10/30/22 0749 10/30/22 1311 10/30/22 1313  BP: 98/64 102/69 111/69 111/69  Pulse: 60 78 74 74  Resp: 18 17    Temp: 97.9 F (36.6 C) 97.7 F (36.5 C)    TempSrc:      SpO2: 96% 96% 96% 96%  Weight:      Height:       No data found.   Intake/Output Summary (Last 24 hours) at 10/30/2022 1319 Last data filed at 10/30/2022 0901 Gross per 24 hour  Intake --  Output 450 ml  Net -450 ml   Filed Weights   10/25/22 2145  Weight: 96.8 kg    Exam:  GEN: NAD SKIN: Warm and dry EYES: No pallor or icterus ENT: MMM CV: RRR PULM: CTA B ABD: soft, ND, NT, +BS CNS: AAO x 3, non focal EXT: Right lower extremity swelling has improved    Data Reviewed:   I have personally reviewed following labs and imaging studies:  Labs: Labs show the following:   Basic Metabolic Panel: Recent Labs  Lab 10/25/22 1144 10/26/22 0308 10/28/22 1221 10/29/22 0412 10/30/22 0517  NA 139 141 140 142 142  K 4.3 3.9 3.7 3.9 3.6  CL 113* 114* 113* 114* 115*  CO2 17* 22 20* 24 24  GLUCOSE 103* 101* 132* 124* 126*  BUN '20 18 16 23 20  '$ CREATININE 1.01 0.92 0.89 0.92 0.86  CALCIUM 9.1 8.6* 8.3* 8.4* 8.3*   GFR Estimated Creatinine Clearance: 90.6 mL/min (by C-G formula based on SCr of 0.86 mg/dL). Liver Function Tests: Recent Labs  Lab 10/25/22 1144  AST 14*  ALT 20  ALKPHOS 75  BILITOT 1.1  PROT 7.2  ALBUMIN 4.0   No results for input(s): "LIPASE", "AMYLASE" in the last 168 hours. No results for input(s): "AMMONIA" in the last 168 hours. Coagulation profile Recent Labs  Lab 10/25/22 2128   INR 1.5*    CBC: Recent Labs  Lab 10/25/22 1144 10/26/22 0308 10/27/22 0414 10/27/22 1234 10/28/22 0323 10/29/22 0412 10/29/22 1124 10/30/22 0517  WBC 6.3   < > 4.2 12.6* 9.7 7.3  --  5.7  NEUTROABS 4.0  --   --   --   --   --   --   --   HGB 11.2*   < > 10.7* 11.1* 10.5* 8.3* 8.7* 8.5*  HCT 37.0*   < > 34.4* 34.5* 33.4* 26.7* 27.4* 27.7*  MCV 96.6   < > 95.0 95.8 93.8 96.0  --  96.9  PLT 262   < > 225 248 229 182  --  175   < > = values in this interval not displayed.   Cardiac Enzymes: No results for input(s): "CKTOTAL", "CKMB", "CKMBINDEX", "TROPONINI" in the last 168 hours. BNP (last 3 results) No results for input(s): "PROBNP" in the last 8760 hours. CBG: No results for input(s): "GLUCAP" in the last 168 hours. D-Dimer: No results for input(s): "DDIMER" in the last 72 hours. Hgb A1c: No results for input(s): "HGBA1C" in the last 72 hours. Lipid Profile: No results for input(s): "CHOL", "HDL", "LDLCALC", "TRIG", "CHOLHDL", "LDLDIRECT" in the last 72 hours. Thyroid function studies: No results for input(s): "TSH", "T4TOTAL", "T3FREE", "THYROIDAB" in the last 72 hours.  Invalid input(s): "FREET3" Anemia work up: No results for input(s): "VITAMINB12", "FOLATE", "FERRITIN", "TIBC", "IRON", "RETICCTPCT" in the last 72 hours. Sepsis Labs: Recent Labs  Lab 10/27/22 1234 10/28/22 0323 10/28/22 1221 10/28/22 1421 10/29/22 0412 10/30/22 0517  WBC 12.6* 9.7  --   --  7.3 5.7  LATICACIDVEN  --   --  1.6 1.3  --   --     Microbiology Recent Results (from the past 240 hour(s))  Resp Panel by RT-PCR (Flu A&B, Covid) Anterior Nasal Swab     Status: None   Collection Time: 10/25/22 11:44 AM   Specimen: Anterior Nasal Swab  Result Value Ref Range Status   SARS Coronavirus 2 by RT PCR NEGATIVE NEGATIVE Final    Comment: (NOTE) SARS-CoV-2 target nucleic acids are NOT DETECTED.  The SARS-CoV-2 RNA is generally detectable in upper respiratory specimens during the acute  phase of infection. The lowest concentration of SARS-CoV-2 viral copies this assay can detect is 138 copies/mL. A negative result does not preclude SARS-Cov-2 infection and should not be used as the sole basis for treatment or other patient management decisions. A negative result may occur with  improper specimen collection/handling, submission of specimen other than nasopharyngeal swab, presence of viral mutation(s) within the areas targeted by this assay, and inadequate number of viral copies(<138 copies/mL). A negative result must be combined with clinical observations, patient history, and epidemiological information. The expected result is Negative.  Fact Sheet for Patients:  EntrepreneurPulse.com.au  Fact Sheet for Healthcare Providers:  IncredibleEmployment.be  This test is no t yet approved or cleared by the [place] FDA and  has been authorized for detection and/or diagnosis of SARS-CoV-2 by FDA under an Emergency Use Authorization (EUA). This EUA will remain  in effect (meaning this test can be used) for the duration of the COVID-19 declaration under Section 564(b)(1) of the Act, 21 U.S.C.section 360bbb-3(b)(1), unless the authorization is terminated  or revoked sooner.       Influenza A by PCR NEGATIVE NEGATIVE Final   Influenza B by PCR NEGATIVE NEGATIVE Final    Comment: (NOTE) The Xpert Xpress SARS-CoV-2/FLU/RSV plus assay is intended as an aid in the diagnosis of influenza from Nasopharyngeal swab specimens and should not be used as a sole basis for treatment. Nasal washings and aspirates are unacceptable for Xpert Xpress SARS-CoV-2/FLU/RSV testing.  Fact Sheet for Patients: EntrepreneurPulse.com.au  Fact Sheet for Healthcare Providers: IncredibleEmployment.be  This test is not yet approved or cleared by the [place] FDA and has been authorized for detection and/or diagnosis of  SARS-CoV-2 by FDA under an Emergency Use Authorization (  EUA). This EUA will remain in effect (meaning this test can be used) for the duration of the COVID-19 declaration under Section 564(b)(1) of the Act, 21 U.S.C. section 360bbb-3(b)(1), unless the authorization is terminated or revoked.  Performed at [facility], [address] [place], [place] [address]     Procedures and diagnostic studies:  ECHOCARDIOGRAM COMPLETE  Result Date: 10/29/2022    ECHOCARDIOGRAM REPORT   Patient Name:   Russell [name] Date of Exam: 10/29/2022 Medical Rec #:  [id number]      Height:       70.0 in Accession #:    [id number]     Weight:       213.4 lb Date of Birth:  1951/07/22      BSA:          2.146 m Patient Age:    56 years       BP:           102/66 mmHg Patient Gender: M              HR:           78 bpm. Exam Location:  [facility] Procedure: 2D Echo and Strain Analysis Indications:     Shock  History:         Patient has prior history of Echocardiogram examinations, most                  recent 04/07/2022.  Sonographer:     Kathlen Brunswick RDCS Referring Phys:  [id number] Bradly Bienenstock Diagnosing Phys: Ida Rogue MD  Sonographer Comments: Global longitudinal strain was attempted. IMPRESSIONS  1. Left ventricular ejection fraction, by estimation, is 60 to 65%. The left ventricle has normal function. The left ventricle has no regional wall motion abnormalities. There is moderate left ventricular hypertrophy. Left ventricular diastolic parameters are consistent with Grade I diastolic dysfunction (impaired relaxation). The average left ventricular global longitudinal strain is -23.8 %.  2. Right ventricular systolic function is normal. The right ventricular size is normal. Tricuspid regurgitation signal is inadequate for assessing PA pressure.  3. Left atrial size was mildly dilated.  4. The mitral valve is normal in structure. Mild to moderate mitral valve regurgitation. No evidence of mitral stenosis.  5.  The aortic valve is normal in structure. Aortic valve regurgitation is not visualized. Aortic valve sclerosis is present, with no evidence of aortic valve stenosis.  6. There is mild dilatation of the aortic root, measuring 40 mm.  7. The inferior vena cava is normal in size with greater than 50% respiratory variability, suggesting right atrial pressure of 3 mmHg. FINDINGS  Left Ventricle: Left ventricular ejection fraction, by estimation, is 60 to 65%. The left ventricle has normal function. The left ventricle has no regional wall motion abnormalities. The average left ventricular global longitudinal strain is -23.8 %. The left ventricular internal cavity size was normal in size. There is moderate left ventricular hypertrophy. Left ventricular diastolic parameters are consistent with Grade I diastolic dysfunction (impaired relaxation). Right Ventricle: The right ventricular size is normal. No increase in right ventricular wall thickness. Right ventricular systolic function is normal. Tricuspid regurgitation signal is inadequate for assessing PA pressure. Left Atrium: Left atrial size was mildly dilated. Right Atrium: Right atrial size was normal in size. Pericardium: There is no evidence of pericardial effusion. Mitral Valve: The mitral valve is normal in structure. Mild to moderate mitral valve regurgitation. No evidence of mitral valve stenosis. Tricuspid Valve: The tricuspid valve  is normal in structure. Tricuspid valve regurgitation is mild . No evidence of tricuspid stenosis. Aortic Valve: The aortic valve is normal in structure. Aortic valve regurgitation is not visualized. Aortic valve sclerosis is present, with no evidence of aortic valve stenosis. Aortic valve mean gradient measures 7.0 mmHg. Aortic valve peak gradient measures 14.1 mmHg. Aortic valve area, by VTI measures 2.89 cm. Pulmonic Valve: The pulmonic valve was normal in structure. Pulmonic valve regurgitation is not visualized. No evidence of  pulmonic stenosis. Aorta: The aortic root is normal in size and structure. There is mild dilatation of the aortic root, measuring 40 mm. Venous: The inferior vena cava is normal in size with greater than 50% respiratory variability, suggesting right atrial pressure of 3 mmHg. IAS/Shunts: No atrial level shunt detected by color flow Doppler.  LEFT VENTRICLE PLAX 2D LVIDd:         4.20 cm     Diastology LVIDs:         2.90 cm     LV e' medial:    8.81 cm/s LV PW:         1.50 cm     LV E/e' medial:  9.2 LV IVS:        1.40 cm     LV e' lateral:   11.00 cm/s LVOT diam:     2.20 cm     LV E/e' lateral: 7.4 LV SV:         103 LV SV Index:   48          2D Longitudinal Strain LVOT Area:     3.80 cm    2D Strain GLS Avg:     -23.8 %  LV Volumes (MOD) LV vol d, MOD A2C: 77.1 ml LV vol d, MOD A4C: 85.9 ml LV vol s, MOD A2C: 34.0 ml LV vol s, MOD A4C: 27.6 ml LV SV MOD A2C:     43.1 ml LV SV MOD A4C:     85.9 ml LV SV MOD BP:      52.8 ml RIGHT VENTRICLE RV Basal diam:  2.50 cm RV S prime:     14.90 cm/s TAPSE (M-mode): 1.6 cm LEFT ATRIUM             Index        RIGHT ATRIUM           Index LA diam:        4.40 cm 2.05 cm/m   RA Area:     15.50 cm LA Vol (A2C):   61.1 ml 28.48 ml/m  RA Volume:   30.80 ml  14.35 ml/m LA Vol (A4C):   79.0 ml 36.82 ml/m LA Biplane Vol: 73.8 ml 34.39 ml/m  AORTIC VALVE                     PULMONIC VALVE AV Area (Vmax):    2.47 cm      PV Vmax:       1.06 m/s AV Area (Vmean):   2.61 cm      PV Peak grad:  4.5 mmHg AV Area (VTI):     2.89 cm AV Vmax:           188.00 cm/s AV Vmean:          120.000 cm/s AV VTI:            0.355 m AV Peak Grad:      14.1 mmHg AV Mean Grad:  7.0 mmHg LVOT Vmax:         122.00 cm/s LVOT Vmean:        82.400 cm/s LVOT VTI:          0.270 m LVOT/AV VTI ratio: 0.76  AORTA Ao Root diam: 4.00 cm Ao Asc diam:  3.10 cm MITRAL VALVE MV Area (PHT): 3.54 cm     SHUNTS MV Decel Time: 214 msec     Systemic VTI:  0.27 m MV E velocity: 81.20 cm/s   Systemic Diam:  2.20 cm MV A velocity: 121.00 cm/s MV E/A ratio:  0.67 Ida Rogue MD Electronically signed by Ida Rogue MD Signature Date/Time: 10/29/2022/12:23:32 PM    Final                LOS: 5 days   Torin Whisner  [facility]   Pager on [email or web address]. If 7PM-7AM, please contact night-coverage at [email or web address]     10/30/2022, 1:19 PM

## 2022-10-30 NOTE — Plan of Care (Signed)
  Problem: Coping: Goal: Level of anxiety will decrease Outcome: Progressing   Problem: Nutrition: Goal: Adequate nutrition will be maintained Outcome: Progressing   Problem: Activity: Goal: Risk for activity intolerance will decrease Outcome: Progressing   Problem: Pain Managment: Goal: General experience of comfort will improve Outcome: Progressing   Problem: Safety: Goal: Ability to remain free from injury will improve Outcome: Progressing

## 2022-10-31 LAB — CBC
HCT: 27.4 % — ABNORMAL LOW (ref 39.0–52.0)
Hemoglobin: 8.4 g/dL — ABNORMAL LOW (ref 13.0–17.0)
MCH: 29.3 pg (ref 26.0–34.0)
MCHC: 30.7 g/dL (ref 30.0–36.0)
MCV: 95.5 fL (ref 80.0–100.0)
Platelets: 167 10*3/uL (ref 150–400)
RBC: 2.87 MIL/uL — ABNORMAL LOW (ref 4.22–5.81)
RDW: 14.6 % (ref 11.5–15.5)
WBC: 4.9 10*3/uL (ref 4.0–10.5)
nRBC: 0 % (ref 0.0–0.2)

## 2022-10-31 LAB — ACTH: C206 ACTH: 3.5 pg/mL — ABNORMAL LOW (ref 7.2–63.3)

## 2022-10-31 LAB — BASIC METABOLIC PANEL
Anion gap: 4 — ABNORMAL LOW (ref 5–15)
BUN: 19 mg/dL (ref 8–23)
CO2: 25 mmol/L (ref 22–32)
Calcium: 8.2 mg/dL — ABNORMAL LOW (ref 8.9–10.3)
Chloride: 116 mmol/L — ABNORMAL HIGH (ref 98–111)
Creatinine, Ser: 0.87 mg/dL (ref 0.61–1.24)
GFR, Estimated: >60 mL/min (ref >60)
Glucose, Bld: 92 mg/dL (ref 70–99)
Potassium: 3.3 mmol/L — ABNORMAL LOW (ref 3.5–5.1)
Sodium: 145 mmol/L (ref 135–145)

## 2022-10-31 LAB — POTASSIUM: Potassium: 3.4 mmol/L — ABNORMAL LOW (ref 3.5–5.1)

## 2022-10-31 MED ORDER — APIXABAN 5 MG PO TABS: 5.0000 mg | ORAL_TABLET | Freq: Two times a day (BID) | ORAL | 0 refills | Status: DC

## 2022-10-31 MED ORDER — POTASSIUM CHLORIDE CRYS ER 20 MEQ PO TBCR
40.0000 meq | EXTENDED_RELEASE_TABLET | Freq: Once | ORAL | Status: AC
  Administered 2022-10-31: 40 meq via ORAL
  Filled 2022-10-31: qty 2

## 2022-10-31 MED ORDER — APIXABAN 5 MG PO TABS: 10.0000 mg | ORAL_TABLET | Freq: Two times a day (BID) | ORAL | 0 refills | Status: DC

## 2022-10-31 MED ORDER — POTASSIUM CHLORIDE CRYS ER 20 MEQ PO TBCR: 20.0000 meq | EXTENDED_RELEASE_TABLET | Freq: Every day | ORAL | 0 refills | Status: DC

## 2022-10-31 NOTE — Progress Notes (Signed)
[facility]  Daily Progress Note   Subjective  POD #4 S/P right common femoral, iliac vein and IVC thrombectomy on 10/27/2022   Russell [name] is a 71 y.o. 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    He is now status post catheter directed thrombolysis to the right iliac, femoral vein, mechanical thrombectomy right common femoral, iliac veins and IVC on 10/27/2022.  This is his second intervention in 60 days.  Patient has no complaints today.  Patient states his leg is still swollen and tight but better than before.  Patient states he would like to go home today.    Objective Vitals:   10/30/22 2334 10/31/22 0101 10/31/22 0700 10/31/22 1430  BP: 92/65 111/76 112/71 117/83  Pulse: 72 67 98 89  Resp: '18  18 18  '$ Temp: 97.9 F (36.6 C)  97.9 F (36.6 C) 98.3 F (36.8 C)  TempSrc:   Oral Oral  SpO2: 98%  100% 96%  Weight:      Height:        Intake/Output Summary (Last 24 hours) at 10/31/2022 1932 Last data filed at 10/31/2022 0900 Gross per 24 hour  Intake 1200 ml  Output 1100 ml  Net 100 ml    PULM  CTAB CV  RRR VASC  right lower extremity swelling +2 edema.  Posttibial and dorsalis pedis pulses were dopplerable.  Right lower extremity was warm to touch.  Laboratory CBC    Component Value Date/Time   WBC 4.9 10/31/2022 0637   HGB 8.4 (L) 10/31/2022 0637   HGB 14.9 10/14/2019 1424   HCT 27.4 (L) 10/31/2022 0637   HCT 45.9 10/14/2019 1424   PLT 167 10/31/2022 0637   PLT 208 10/14/2019 1424    BMET    Component Value Date/Time   NA 145 10/31/2022 0637   NA 143 10/14/2019 1424   K 3.4 (L) 10/31/2022 1258   CL 116 (H) 10/31/2022 0637   CO2 25 10/31/2022 0637   GLUCOSE 92 10/31/2022 0637   BUN 19 10/31/2022 0637   BUN 16 10/14/2019 1424   CREATININE 0.87 10/31/2022 0637    CALCIUM 8.2 (L) 10/31/2022 0637   GFRNONAA >60 10/31/2022 0637   GFRAA >60 05/03/2020 0604    Assessment/Planning: POD # 4 S/P IVC gram and right lower extremity venogram with Catheter directed thrombolysis with 8 mg of tPA to the iliac and common femoral veins and Mechanical thrombectomy to right common femoral vein, iliac veins, and IVC with PTA of the common femoral vein and distal external iliac vein.   Russell [name] is being discharged today postprocedure.  I had a long discussion with him regarding taking his anticoagulation medication.  He is currently discharged on 10 mg of apixaban twice daily and on 11/04/2022 he is to change to 5 mg twice daily.  He was instructed that if he does not take this medication he will have another thrombus to his right leg.  He was instructed to walk is much as he can in order to help reduce the swelling that continues to his right lower extremity.  Russell [name] his understanding of all my discharge instructions.  We will see him back in clinic in 1 to 2 weeks for follow-up.  [name]  10/31/2022, 7:32 PM

## 2022-10-31 NOTE — Plan of Care (Signed)
Patient discharged per MD orders at this time.All dc instructions, education and medications reviewed with the patient.Pt expressed understanding & will comply with dc instructions.f/u appointments was also communicated to the patient.no verbal c/o or any ssx of distress.Pt was discharged home with self-care per order.patient was transported home by spouse in a privately owned vehicle.

## 2022-10-31 NOTE — Discharge Summary (Signed)
Physician Discharge Summary   Patient: Russell [name] [name]: [id number] DOB: 1950-12-26  Admit date:     10/25/2022  Discharge date: {dischdate:26783}  Discharge Physician: Jennye Boroughs   PCP: Norm Parcel, MD   Recommendations at discharge:  {Tip this will not be part of the note when signed- Example include specific recommendations for outpatient follow-up, pending tests to follow-up on. (Optional):26781}  ***  Discharge Diagnoses: Principal Problem:   DVT, recurrent, lower extremity, acute, right ([place]) Active Problems:   History of pulmonary embolism   ILD (interstitial lung disease) (HCC)   Hypotension   Normocytic anemia   HTN (hypertension)   Dyslipidemia  Resolved Problems:   * No resolved hospital problems. [facility] Course: No notes on file   Discharge plan discussed with Russell [name], daughter, over the phone  Assessment and Plan: No notes have been filed under this hospital service. Service: Hospitalist     {Tip this will not be part of the note when signed Body mass index is 30.62 kg/m. , ,  (Optional):26781}  {(NOTE) Pain control PDMP Statment (Optional):26782} Consultants: *** Procedures performed: ***  Disposition: {Plan; Disposition:26390} Diet recommendation:  Discharge Diet Orders (From admission, onward)     Start     Ordered   10/31/22 0000  Diet - low sodium heart healthy        10/31/22 1516           {Diet_Plan:26776} DISCHARGE MEDICATION: Allergies as of 10/31/2022       Reactions   Penicillins Swelling   Shrimp [shellfish Allergy] Hives   Cisatracurium Other (See Comments)   Other reaction(s): Unknown Other reaction(s): Unknown   Heparin Hives, Swelling   Patient's daughter reports patient had swelling/hives during hospital admission in [place]. They were on several medications at once so she is unsure whether it was VANCOMYCIN that caused this reaction or another concomitant medication.    Rocuronium Other (See Comments)    Other reaction(s): Unknown Other reaction(s): Unknown   Vancomycin Hives, Swelling   Patient's daughter reports patient had swelling/hives during hospital admission in [place]. They were on several medications at once so she is unsure whether it was VANCOMYCIN that caused this reaction or another concomitant medication.    Wound Dressings Rash   Tegaderm        Medication List     STOP taking these medications    HYDROcodone-acetaminophen 5-325 MG tablet Commonly known as: NORCO/VICODIN   methocarbamol 750 MG tablet Commonly known as: ROBAXIN   ondansetron 4 MG disintegrating tablet Commonly known as: ZOFRAN-ODT   ondansetron 4 MG tablet Commonly known as: ZOFRAN   triamcinolone lotion 0.1 % Commonly known as: KENALOG       TAKE these medications    apixaban 5 MG Tabs tablet Commonly known as: ELIQUIS Take 2 tablets (10 mg total) by mouth 2 (two) times daily for 3 days. What changed: how much to take   apixaban 5 MG Tabs tablet Commonly known as: ELIQUIS Take 1 tablet (5 mg total) by mouth 2 (two) times daily. Start taking on: November 04, 2022 What changed: You were already taking a medication with the same name, and this prescription was added. Make sure you understand how and when to take each.   doxycycline 100 MG capsule Commonly known as: VIBRAMYCIN Take 100 mg by mouth daily.   gabapentin 300 MG capsule Commonly known as: NEURONTIN Take 300 mg by mouth 3 (three) times daily.   lidocaine 5 % ointment Commonly known  as: XYLOCAINE Apply 1 Application topically 3 (three) times daily as needed for mild pain.   potassium chloride SA 20 MEQ tablet Commonly known as: KLOR-CON M Take 1 tablet (20 mEq total) by mouth daily for 3 days.        Follow-up Information     Dew, Erskine Squibb, MD. Schedule an appointment as soon as possible for a visit on 11/04/2022.   Specialties: Vascular Surgery, Radiology, Interventional Cardiology Contact information: [address] [place] [address] [phone]                Discharge Exam: Danley Danker Weights   10/25/22 2145  Weight: 96.8 kg   ***  Condition at discharge: {DC Condition:26389}  The results of significant diagnostics from this hospitalization (including imaging, microbiology, ancillary and laboratory) are listed below for reference.   Imaging Studies: ECHOCARDIOGRAM COMPLETE  Result Date: 10/29/2022    ECHOCARDIOGRAM REPORT   Patient Name:   Russell [name] Date of Exam: 10/29/2022 Medical Rec #:  [id number]      Height:       70.0 in Accession #:    [id number]     Weight:       213.4 lb Date of Birth:  1951-08-31      BSA:          2.146 m Patient Age:    71 years       BP:           102/66 mmHg Patient Gender: M              HR:           78 bpm. Exam Location:  [facility] Procedure: 2D Echo and Strain Analysis Indications:     Shock  History:         Patient has prior history of Echocardiogram examinations, most                  recent 04/07/2022.  Sonographer:     Kathlen Brunswick RDCS Referring Phys:  [id number] Bradly Bienenstock Diagnosing Phys: Ida Rogue MD  Sonographer Comments: Global longitudinal strain was attempted. IMPRESSIONS  1. Left ventricular ejection fraction, by estimation, is 60 to 65%. The left ventricle has normal function. The left ventricle has no regional wall motion abnormalities. There is moderate left ventricular hypertrophy. Left ventricular diastolic parameters are consistent with Grade I diastolic dysfunction (impaired relaxation). The average left ventricular global longitudinal strain is -23.8 %.  2. Right ventricular systolic function is normal. The right ventricular size is normal. Tricuspid regurgitation signal is inadequate for assessing PA pressure.  3. Left atrial size was mildly dilated.  4. The mitral valve is normal in structure. Mild to moderate mitral valve regurgitation. No evidence of mitral stenosis.  5. The aortic valve is normal in structure. Aortic  valve regurgitation is not visualized. Aortic valve sclerosis is present, with no evidence of aortic valve stenosis.  6. There is mild dilatation of the aortic root, measuring 40 mm.  7. The inferior vena cava is normal in size with greater than 50% respiratory variability, suggesting right atrial pressure of 3 mmHg. FINDINGS  Left Ventricle: Left ventricular ejection fraction, by estimation, is 60 to 65%. The left ventricle has normal function. The left ventricle has no regional wall motion abnormalities. The average left ventricular global longitudinal strain is -23.8 %. The left ventricular internal cavity size was normal in size. There is moderate left ventricular hypertrophy. Left ventricular diastolic parameters are consistent  with Grade I diastolic dysfunction (impaired relaxation). Right Ventricle: The right ventricular size is normal. No increase in right ventricular wall thickness. Right ventricular systolic function is normal. Tricuspid regurgitation signal is inadequate for assessing PA pressure. Left Atrium: Left atrial size was mildly dilated. Right Atrium: Right atrial size was normal in size. Pericardium: There is no evidence of pericardial effusion. Mitral Valve: The mitral valve is normal in structure. Mild to moderate mitral valve regurgitation. No evidence of mitral valve stenosis. Tricuspid Valve: The tricuspid valve is normal in structure. Tricuspid valve regurgitation is mild . No evidence of tricuspid stenosis. Aortic Valve: The aortic valve is normal in structure. Aortic valve regurgitation is not visualized. Aortic valve sclerosis is present, with no evidence of aortic valve stenosis. Aortic valve mean gradient measures 7.0 mmHg. Aortic valve peak gradient measures 14.1 mmHg. Aortic valve area, by VTI measures 2.89 cm. Pulmonic Valve: The pulmonic valve was normal in structure. Pulmonic valve regurgitation is not visualized. No evidence of pulmonic stenosis. Aorta: The aortic root is  normal in size and structure. There is mild dilatation of the aortic root, measuring 40 mm. Venous: The inferior vena cava is normal in size with greater than 50% respiratory variability, suggesting right atrial pressure of 3 mmHg. IAS/Shunts: No atrial level shunt detected by color flow Doppler.  LEFT VENTRICLE PLAX 2D LVIDd:         4.20 cm     Diastology LVIDs:         2.90 cm     LV e' medial:    8.81 cm/s LV PW:         1.50 cm     LV E/e' medial:  9.2 LV IVS:        1.40 cm     LV e' lateral:   11.00 cm/s LVOT diam:     2.20 cm     LV E/e' lateral: 7.4 LV SV:         103 LV SV Index:   48          2D Longitudinal Strain LVOT Area:     3.80 cm    2D Strain GLS Avg:     -23.8 %  LV Volumes (MOD) LV vol d, MOD A2C: 77.1 ml LV vol d, MOD A4C: 85.9 ml LV vol s, MOD A2C: 34.0 ml LV vol s, MOD A4C: 27.6 ml LV SV MOD A2C:     43.1 ml LV SV MOD A4C:     85.9 ml LV SV MOD BP:      52.8 ml RIGHT VENTRICLE RV Basal diam:  2.50 cm RV S prime:     14.90 cm/s TAPSE (M-mode): 1.6 cm LEFT ATRIUM             Index        RIGHT ATRIUM           Index LA diam:        4.40 cm 2.05 cm/m   RA Area:     15.50 cm LA Vol (A2C):   61.1 ml 28.48 ml/m  RA Volume:   30.80 ml  14.35 ml/m LA Vol (A4C):   79.0 ml 36.82 ml/m LA Biplane Vol: 73.8 ml 34.39 ml/m  AORTIC VALVE                     PULMONIC VALVE AV Area (Vmax):    2.47 cm      PV Vmax:  1.06 m/s AV Area (Vmean):   2.61 cm      PV Peak grad:  4.5 mmHg AV Area (VTI):     2.89 cm AV Vmax:           188.00 cm/s AV Vmean:          120.000 cm/s AV VTI:            0.355 m AV Peak Grad:      14.1 mmHg AV Mean Grad:      7.0 mmHg LVOT Vmax:         122.00 cm/s LVOT Vmean:        82.400 cm/s LVOT VTI:          0.270 m LVOT/AV VTI ratio: 0.76  AORTA Ao Root diam: 4.00 cm Ao Asc diam:  3.10 cm MITRAL VALVE MV Area (PHT): 3.54 cm     SHUNTS MV Decel Time: 214 msec     Systemic VTI:  0.27 m MV E velocity: 81.20 cm/s   Systemic Diam: 2.20 cm MV A velocity: 121.00 cm/s MV E/A  ratio:  0.67 Ida Rogue MD Electronically signed by Ida Rogue MD Signature Date/Time: 10/29/2022/12:23:32 PM    Final    PERIPHERAL VASCULAR CATHETERIZATION  Result Date: 10/27/2022 See surgical note for result.  US Venous Img Lower Unilateral Right  Result Date: 10/25/2022 CLINICAL DATA:  Pain and swelling EXAM: Right LOWER EXTREMITY VENOUS DOPPLER ULTRASOUND TECHNIQUE: Gray-scale sonography with compression, as well as color and duplex ultrasound, were performed to evaluate the deep venous system(s) from the level of the common femoral vein through the popliteal and proximal calf veins. COMPARISON:  None Available. FINDINGS: VENOUS There is evidence of occlusive deep venous thrombosis in right common femoral vein. Nonocclusive DVT is noted in the proximal femoral vein. Rest of the major deep veins appear patent. There is nonocclusive superficial phlebitis in greater saphenous vein. Limited views of the contralateral common femoral vein are unremarkable. OTHER None. Limitations: none IMPRESSION: Occlusive DVT is seen in right common femoral vein. Nonocclusive DVT is noted in proximal course of right femoral vein. Superficial phlebitis is noted in the right greater saphenous vein. Electronically Signed   By: Elmer Picker M.D.   On: 10/25/2022 13:33   DG Chest 2 View  Result Date: 10/25/2022 CLINICAL DATA:  Cough and shortness of breath.  Leg swelling. EXAM: CHEST - 2 VIEW COMPARISON:  06/11/2022 FINDINGS: Thoracic spine fixation, suboptimally evaluated. Upper thoracic vertebral corpectomy. IVC filter. Patient rotated to the right. Mild cardiomegaly. No pleural effusion or pneumothorax. Peripheral and basilar predominant interstitial thickening likely corresponds to interstitial lung disease on 10/06/2022 CT. No superimposed congestive failure or lobar consolidation. IMPRESSION: Interstitial lung disease, grossly similar to the prior CTA chest. Consider pulmonology consultation for eventual  dedicated high-resolution chest CT. Cardiomegaly without superimposed congestive failure or acute process. Electronically Signed   By: Abigail Miyamoto M.D.   On: 10/25/2022 12:26   PERIPHERAL VASCULAR CATHETERIZATION  Result Date: 10/07/2022 See surgical note for result.  CT Angio Chest Pulmonary Embolism (PE) W or WO Contrast  Result Date: 10/06/2022 CLINICAL DATA:  Tachycardia, history of DVT in right lower extremity EXAM: CT ANGIOGRAPHY CHEST WITH CONTRAST TECHNIQUE: Multidetector CT imaging of the chest was performed using the standard protocol during bolus administration of intravenous contrast. Multiplanar CT image reconstructions and MIPs were obtained to evaluate the vascular anatomy. RADIATION DOSE REDUCTION: This exam was performed according to the departmental dose-optimization program which includes automated exposure  control, adjustment of the mA and/or kV according to patient size and/or use of iterative reconstruction technique. CONTRAST:  2m OMNIPAQUE IOHEXOL 350 MG/ML SOLN COMPARISON:  CT chest done on 06/11/2022 FINDINGS: Cardiovascular: There is homogeneous enhancement in thoracic aorta. Calcifications are seen in mitral annulus. There are no intraluminal filling defects in central pulmonary artery branches. Evaluation of small peripheral branches is limited by motion artifacts, especially in the lower lung fields. Mediastinum/Nodes: No significant lymphadenopathy is seen. Lungs/Pleura: Increased interstitial markings are seen in the periphery of both lungs, more so in the lower lung fields. There is no focal consolidation. There is slight improvement in aeration of lower lung fields. There is no pleural effusion or pneumothorax. Upper Abdomen: There is fatty infiltration in liver. There is subtle increased density in the dependent portion of gallbladder. There is no wall thickening in gallbladder. Small hiatal hernia is seen. Musculoskeletal: There is laminectomy and surgical fusion in  the thoracic spine there is a metallic structure embedded in the bodies of T3, T4 and T5 vertebrae. Review of the MIP images confirms the above findings. IMPRESSION: There is no evidence of central pulmonary artery embolism. There is no evidence of thoracic aortic dissection. Increased interstitial markings are seen in both lungs, more so in the lower lung fields. 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 Possibility of superimposed interstitial pneumonia is not excluded. There is no pleural effusion or pneumothorax. Small hiatal hernia. There is subtle increased density in the dependent portion of gallbladder suggesting tiny gallbladder stones or sludge. Other findings as described in the body of the report. Electronically Signed   By: PElmer PickerM.D.   On: 10/06/2022 16:13   UKoreaVenous Img Lower Unilateral Right (DVT)  Result Date: 10/06/2022 CLINICAL DATA:  Right lower extremity edema EXAM: RIGHT LOWER EXTREMITY VENOUS DOPPLER ULTRASOUND TECHNIQUE: Gray-scale sonography with graded compression, as well as color Doppler and duplex ultrasound were performed to evaluate the lower extremity deep venous systems from the level of the common femoral vein and including the common femoral, femoral, profunda femoral, popliteal and calf veins including the posterior tibial, peroneal and gastrocnemius veins when visible. The superficial great saphenous vein was also interrogated. Spectral Doppler was utilized to evaluate flow at rest and with distal augmentation maneuvers in the common femoral, femoral and popliteal veins. COMPARISON:  None Available. FINDINGS: Contralateral Common Femoral Vein: Respiratory phasicity is normal and symmetric with the symptomatic side. No evidence of thrombus. Normal compressibility. Common Femoral Vein: The common femoral vein is not compressible. The vein is expanded and filled with low-level internal echoes. Findings are consistent with acute occlusive  DVT. No evidence of color flow on color Doppler imaging. Saphenofemoral Junction: Occlusive DVT extends into the saphenofemoral junction. The remainder of the great saphenous vein is patent. Profunda Femoral Vein: Occlusive thrombus extends into the profunda femoral vein. Femoral Vein: Occlusive thrombus extends into the femoral vein throughout the thigh. Popliteal Vein: Occlusive thrombus extends into the popliteal vein. Calf Veins: Occlusive thrombus extends into the posterior tibial veins. Superficial Great Saphenous Vein: No evidence of thrombus. Normal compressibility. Venous Reflux:  None. Other Findings:  None. IMPRESSION: Positive for extensive acute occlusive DVT throughout the right lower extremity from the common femoral vein into the calf. These results will be called to the ordering clinician or representative by the Radiologist Assistant, and communication documented in the PACS or [employer] Electronically Signed   By: HJacqulynn CadetM.D.   On: 10/06/2022 11:52  Microbiology: Results for orders placed or performed during the hospital encounter of 10/25/22  Resp Panel by RT-PCR (Flu A&B, Covid) Anterior Nasal Swab     Status: None   Collection Time: 10/25/22 11:44 AM   Specimen: Anterior Nasal Swab  Result Value Ref Range Status   SARS Coronavirus 2 by RT PCR NEGATIVE NEGATIVE Final    Comment: (NOTE) SARS-CoV-2 target nucleic acids are NOT DETECTED.  The SARS-CoV-2 RNA is generally detectable in upper respiratory specimens during the acute phase of infection. The lowest concentration of SARS-CoV-2 viral copies this assay can detect is 138 copies/mL. A negative result does not preclude SARS-Cov-2 infection and should not be used as the sole basis for treatment or other patient management decisions. A negative result may occur with  improper specimen collection/handling, submission of specimen other than nasopharyngeal swab, presence of viral mutation(s) within the areas  targeted by this assay, and inadequate number of viral copies(<138 copies/mL). A negative result must be combined with clinical observations, patient history, and epidemiological information. The expected result is Negative.  Fact Sheet for Patients:  EntrepreneurPulse.com.au  Fact Sheet for Healthcare Providers:  IncredibleEmployment.be  This test is no t yet approved or cleared by the [place] FDA and  has been authorized for detection and/or diagnosis of SARS-CoV-2 by FDA under an Emergency Use Authorization (EUA). This EUA will remain  in effect (meaning this test can be used) for the duration of the COVID-19 declaration under Section 564(b)(1) of the Act, 21 U.S.C.section 360bbb-3(b)(1), unless the authorization is terminated  or revoked sooner.       Influenza A by PCR NEGATIVE NEGATIVE Final   Influenza B by PCR NEGATIVE NEGATIVE Final    Comment: (NOTE) The Xpert Xpress SARS-CoV-2/FLU/RSV plus assay is intended as an aid in the diagnosis of influenza from Nasopharyngeal swab specimens and should not be used as a sole basis for treatment. Nasal washings and aspirates are unacceptable for Xpert Xpress SARS-CoV-2/FLU/RSV testing.  Fact Sheet for Patients: EntrepreneurPulse.com.au  Fact Sheet for Healthcare Providers: IncredibleEmployment.be  This test is not yet approved or cleared by the [place] FDA and has been authorized for detection and/or diagnosis of SARS-CoV-2 by FDA under an Emergency Use Authorization (EUA). This EUA will remain in effect (meaning this test can be used) for the duration of the COVID-19 declaration under Section 564(b)(1) of the Act, 21 U.S.C. section 360bbb-3(b)(1), unless the authorization is terminated or revoked.  Performed at [facility], [address] [place], [place] [address]     Labs: CBC: Recent Labs  Lab 10/25/22 1144  10/26/22 0308 10/27/22 1234 10/28/22 0323 10/29/22 0412 10/29/22 1124 10/30/22 0517 10/31/22 0637  WBC 6.3   < > 12.6* 9.7 7.3  --  5.7 4.9  NEUTROABS 4.0  --   --   --   --   --   --   --   HGB 11.2*   < > 11.1* 10.5* 8.3* 8.7* 8.5* 8.4*  HCT 37.0*   < > 34.5* 33.4* 26.7* 27.4* 27.7* 27.4*  MCV 96.6   < > 95.8 93.8 96.0  --  96.9 95.5  PLT 262   < > 248 229 182  --  175 167   < > = values in this interval not displayed.   Basic Metabolic Panel: Recent Labs  Lab 10/26/22 0308 10/28/22 1221 10/29/22 0412 10/30/22 0517 10/31/22 0637 10/31/22 1258  NA 141 140 142 142 145  --   K 3.9 3.7  3.9 3.6 3.3* 3.4*  CL 114* 113* 114* 115* 116*  --   CO2 22 20* '24 24 25  '$ --   GLUCOSE 101* 132* 124* 126* 92  --   BUN '18 16 23 20 19  '$ --   CREATININE 0.92 0.89 0.92 0.86 0.87  --   CALCIUM 8.6* 8.3* 8.4* 8.3* 8.2*  --    Liver Function Tests: Recent Labs  Lab 10/25/22 1144  AST 14*  ALT 20  ALKPHOS 75  BILITOT 1.1  PROT 7.2  ALBUMIN 4.0   CBG: No results for input(s): "GLUCAP" in the last 168 hours.  Discharge time spent: {LESS THAN/GREATER HDTP:12258} 30 minutes.  Signed: Jennye Boroughs, MD [facility] 10/31/2022

## 2022-11-03 ENCOUNTER — Other Ambulatory Visit (INDEPENDENT_AMBULATORY_CARE_PROVIDER_SITE_OTHER): Payer: Self-pay | Admitting: Vascular Surgery

## 2022-11-03 DIAGNOSIS — I82411 Acute embolism and thrombosis of right femoral vein: Secondary | ICD-10-CM

## 2022-11-04 ENCOUNTER — Ambulatory Visit (INDEPENDENT_AMBULATORY_CARE_PROVIDER_SITE_OTHER): Payer: Medicare Other

## 2022-11-04 ENCOUNTER — Ambulatory Visit (INDEPENDENT_AMBULATORY_CARE_PROVIDER_SITE_OTHER): Payer: Medicare Other | Admitting: Nurse Practitioner

## 2022-11-04 ENCOUNTER — Encounter (INDEPENDENT_AMBULATORY_CARE_PROVIDER_SITE_OTHER): Payer: Self-pay | Admitting: Nurse Practitioner

## 2022-11-04 VITALS — BP 108/73 | HR 92 | Resp 16 | Wt 213.0 lb

## 2022-11-04 DIAGNOSIS — I82411 Acute embolism and thrombosis of right femoral vein: Secondary | ICD-10-CM

## 2022-11-04 DIAGNOSIS — M25551 Pain in right hip: Secondary | ICD-10-CM | POA: Diagnosis not present

## 2022-11-04 DIAGNOSIS — I10 Essential (primary) hypertension: Secondary | ICD-10-CM | POA: Diagnosis not present

## 2022-11-04 DIAGNOSIS — R21 Rash and other nonspecific skin eruption: Secondary | ICD-10-CM | POA: Diagnosis not present

## 2022-11-05 ENCOUNTER — Emergency Department: Payer: Medicare Other

## 2022-11-05 ENCOUNTER — Inpatient Hospital Stay: Payer: Medicare Other

## 2022-11-05 ENCOUNTER — Other Ambulatory Visit: Payer: Self-pay

## 2022-11-05 ENCOUNTER — Encounter (INDEPENDENT_AMBULATORY_CARE_PROVIDER_SITE_OTHER): Payer: Self-pay | Admitting: Nurse Practitioner

## 2022-11-05 ENCOUNTER — Inpatient Hospital Stay
Admission: EM | Admit: 2022-11-05 | Discharge: 2022-11-10 | DRG: 270 | Disposition: A | Discharge status: Home / Self Care | Payer: Medicare Other | Attending: Hospitalist | Admitting: Hospitalist

## 2022-11-05 DIAGNOSIS — I824Y1 Acute embolism and thrombosis of unspecified deep veins of right proximal lower extremity: Principal | ICD-10-CM

## 2022-11-05 DIAGNOSIS — M1990 Unspecified osteoarthritis, unspecified site: Secondary | ICD-10-CM | POA: Diagnosis present

## 2022-11-05 DIAGNOSIS — I10 Essential (primary) hypertension: Secondary | ICD-10-CM | POA: Diagnosis present

## 2022-11-05 DIAGNOSIS — Z86718 Personal history of other venous thrombosis and embolism: Secondary | ICD-10-CM | POA: Diagnosis not present

## 2022-11-05 DIAGNOSIS — I82220 Acute embolism and thrombosis of inferior vena cava: Secondary | ICD-10-CM | POA: Diagnosis present

## 2022-11-05 DIAGNOSIS — J440 Chronic obstructive pulmonary disease with acute lower respiratory infection: Secondary | ICD-10-CM | POA: Diagnosis present

## 2022-11-05 DIAGNOSIS — Z881 Allergy status to other antibiotic agents status: Secondary | ICD-10-CM

## 2022-11-05 DIAGNOSIS — I82401 Acute embolism and thrombosis of unspecified deep veins of right lower extremity: Secondary | ICD-10-CM | POA: Diagnosis not present

## 2022-11-05 DIAGNOSIS — D509 Iron deficiency anemia, unspecified: Secondary | ICD-10-CM | POA: Diagnosis present

## 2022-11-05 DIAGNOSIS — G629 Polyneuropathy, unspecified: Secondary | ICD-10-CM | POA: Diagnosis present

## 2022-11-05 DIAGNOSIS — I82411 Acute embolism and thrombosis of right femoral vein: Principal | ICD-10-CM | POA: Diagnosis present

## 2022-11-05 DIAGNOSIS — Z95828 Presence of other vascular implants and grafts: Secondary | ICD-10-CM | POA: Diagnosis not present

## 2022-11-05 DIAGNOSIS — M0510 Rheumatoid lung disease with rheumatoid arthritis of unspecified site: Secondary | ICD-10-CM | POA: Diagnosis present

## 2022-11-05 DIAGNOSIS — T82515A Breakdown (mechanical) of umbrella device, initial encounter: Secondary | ICD-10-CM | POA: Diagnosis present

## 2022-11-05 DIAGNOSIS — R21 Rash and other nonspecific skin eruption: Secondary | ICD-10-CM

## 2022-11-05 DIAGNOSIS — Z91013 Allergy to seafood: Secondary | ICD-10-CM

## 2022-11-05 DIAGNOSIS — I82441 Acute embolism and thrombosis of right tibial vein: Secondary | ICD-10-CM | POA: Diagnosis present

## 2022-11-05 DIAGNOSIS — J189 Pneumonia, unspecified organism: Secondary | ICD-10-CM | POA: Diagnosis not present

## 2022-11-05 DIAGNOSIS — I82421 Acute embolism and thrombosis of right iliac vein: Secondary | ICD-10-CM | POA: Diagnosis not present

## 2022-11-05 DIAGNOSIS — J8410 Pulmonary fibrosis, unspecified: Secondary | ICD-10-CM | POA: Diagnosis present

## 2022-11-05 DIAGNOSIS — M5136 Other intervertebral disc degeneration, lumbar region: Secondary | ICD-10-CM | POA: Diagnosis present

## 2022-11-05 DIAGNOSIS — Z792 Long term (current) use of antibiotics: Secondary | ICD-10-CM

## 2022-11-05 DIAGNOSIS — Z7901 Long term (current) use of anticoagulants: Secondary | ICD-10-CM

## 2022-11-05 DIAGNOSIS — M79604 Pain in right leg: Secondary | ICD-10-CM | POA: Diagnosis present

## 2022-11-05 DIAGNOSIS — Z85828 Personal history of other malignant neoplasm of skin: Secondary | ICD-10-CM

## 2022-11-05 DIAGNOSIS — Z8614 Personal history of Methicillin resistant Staphylococcus aureus infection: Secondary | ICD-10-CM | POA: Diagnosis not present

## 2022-11-05 DIAGNOSIS — Z888 Allergy status to other drugs, medicaments and biological substances status: Secondary | ICD-10-CM

## 2022-11-05 DIAGNOSIS — E785 Hyperlipidemia, unspecified: Secondary | ICD-10-CM | POA: Diagnosis present

## 2022-11-05 DIAGNOSIS — J180 Bronchopneumonia, unspecified organism: Secondary | ICD-10-CM | POA: Diagnosis present

## 2022-11-05 DIAGNOSIS — T82868A Thrombosis of vascular prosthetic devices, implants and grafts, initial encounter: Secondary | ICD-10-CM | POA: Diagnosis not present

## 2022-11-05 DIAGNOSIS — K219 Gastro-esophageal reflux disease without esophagitis: Secondary | ICD-10-CM | POA: Diagnosis present

## 2022-11-05 DIAGNOSIS — J441 Chronic obstructive pulmonary disease with (acute) exacerbation: Secondary | ICD-10-CM | POA: Diagnosis present

## 2022-11-05 DIAGNOSIS — Z9109 Other allergy status, other than to drugs and biological substances: Secondary | ICD-10-CM

## 2022-11-05 DIAGNOSIS — J470 Bronchiectasis with acute lower respiratory infection: Secondary | ICD-10-CM | POA: Diagnosis present

## 2022-11-05 DIAGNOSIS — Y838 Other surgical procedures as the cause of abnormal reaction of the patient, or of later complication, without mention of misadventure at the time of the procedure: Secondary | ICD-10-CM | POA: Diagnosis present

## 2022-11-05 DIAGNOSIS — Z789 Other specified health status: Secondary | ICD-10-CM | POA: Insufficient documentation

## 2022-11-05 DIAGNOSIS — Z880 Allergy status to penicillin: Secondary | ICD-10-CM

## 2022-11-05 DIAGNOSIS — Z79899 Other long term (current) drug therapy: Secondary | ICD-10-CM

## 2022-11-05 DIAGNOSIS — Z9049 Acquired absence of other specified parts of digestive tract: Secondary | ICD-10-CM

## 2022-11-05 DIAGNOSIS — Z8619 Personal history of other infectious and parasitic diseases: Secondary | ICD-10-CM | POA: Diagnosis not present

## 2022-11-05 DIAGNOSIS — D649 Anemia, unspecified: Secondary | ICD-10-CM | POA: Diagnosis not present

## 2022-11-05 LAB — CBC WITH DIFFERENTIAL/PLATELET
Abs Immature Granulocytes: 0.01 10*3/uL (ref 0.00–0.07)
Basophils Absolute: 0.1 10*3/uL (ref 0.0–0.1)
Basophils Relative: 1 %
Eosinophils Absolute: 0.3 10*3/uL (ref 0.0–0.5)
Eosinophils Relative: 5 %
HCT: 32.3 % — ABNORMAL LOW (ref 39.0–52.0)
Hemoglobin: 9.7 g/dL — ABNORMAL LOW (ref 13.0–17.0)
Immature Granulocytes: 0 %
Lymphocytes Relative: 23 %
Lymphs Abs: 1.3 10*3/uL (ref 0.7–4.0)
MCH: 28.7 pg (ref 26.0–34.0)
MCHC: 30 g/dL (ref 30.0–36.0)
MCV: 95.6 fL (ref 80.0–100.0)
Monocytes Absolute: 0.8 10*3/uL (ref 0.1–1.0)
Monocytes Relative: 14 %
Neutro Abs: 3.2 10*3/uL (ref 1.7–7.7)
Neutrophils Relative %: 57 %
Platelets: 274 10*3/uL (ref 150–400)
RBC: 3.38 MIL/uL — ABNORMAL LOW (ref 4.22–5.81)
RDW: 14.3 % (ref 11.5–15.5)
WBC: 5.6 10*3/uL (ref 4.0–10.5)
nRBC: 0 % (ref 0.0–0.2)

## 2022-11-05 LAB — PROTIME-INR
INR: 1.2 (ref 0.8–1.2)
Prothrombin Time: 14.9 seconds (ref 11.4–15.2)

## 2022-11-05 LAB — COMPREHENSIVE METABOLIC PANEL
ALT: 15 U/L (ref 0–44)
AST: 15 U/L (ref 15–41)
Albumin: 4 g/dL (ref 3.5–5.0)
Alkaline Phosphatase: 74 U/L (ref 38–126)
Anion gap: 8 (ref 5–15)
BUN: 15 mg/dL (ref 8–23)
CO2: 22 mmol/L (ref 22–32)
Calcium: 8.9 mg/dL (ref 8.9–10.3)
Chloride: 109 mmol/L (ref 98–111)
Creatinine, Ser: 0.91 mg/dL (ref 0.61–1.24)
GFR, Estimated: >60 mL/min (ref >60)
Glucose, Bld: 109 mg/dL — ABNORMAL HIGH (ref 70–99)
Potassium: 4.5 mmol/L (ref 3.5–5.1)
Sodium: 139 mmol/L (ref 135–145)
Total Bilirubin: 1 mg/dL (ref 0.3–1.2)
Total Protein: 7.1 g/dL (ref 6.5–8.1)

## 2022-11-05 LAB — APTT: aPTT: 20 seconds — ABNORMAL LOW (ref 24–36)

## 2022-11-05 LAB — TROPONIN I (HIGH SENSITIVITY): Troponin I (High Sensitivity): 5 ng/L (ref <18)

## 2022-11-05 MED ORDER — DOXYCYCLINE HYCLATE 100 MG PO TABS
100.0000 mg | ORAL_TABLET | Freq: Every day | ORAL | Status: DC
  Administered 2022-11-05 – 2022-11-06 (×2): 100 mg via ORAL
  Filled 2022-11-05 (×2): qty 1

## 2022-11-05 MED ORDER — OXYCODONE HCL 5 MG PO TABS: 5.0000 mg | ORAL_TABLET | ORAL | Status: DC | PRN

## 2022-11-05 MED ORDER — POLYETHYLENE GLYCOL 3350 17 G PO PACK: 17.0000 g | PACK | Freq: Every day | ORAL | Status: DC | PRN

## 2022-11-05 MED ORDER — SODIUM CHLORIDE 0.9% FLUSH
3.0000 mL | Freq: Two times a day (BID) | INTRAVENOUS | Status: DC
  Administered 2022-11-05 – 2022-11-09 (×5): 3 mL via INTRAVENOUS

## 2022-11-05 MED ORDER — METHYLPREDNISOLONE SODIUM SUCC 125 MG IJ SOLR
125.0000 mg | Freq: Once | INTRAMUSCULAR | Status: AC
  Administered 2022-11-05: 125 mg via INTRAVENOUS
  Filled 2022-11-05: qty 2

## 2022-11-05 MED ORDER — HYDROMORPHONE HCL 1 MG/ML IJ SOLN: 0.5000 mg | INTRAMUSCULAR | Status: DC | PRN

## 2022-11-05 MED ORDER — IOHEXOL 350 MG/ML SOLN
100.0000 mL | Freq: Once | INTRAVENOUS | Status: AC | PRN
  Administered 2022-11-05: 100 mL via INTRAVENOUS

## 2022-11-05 MED ORDER — TRAZODONE HCL 50 MG PO TABS
50.0000 mg | ORAL_TABLET | Freq: Every evening | ORAL | Status: DC | PRN
  Administered 2022-11-06 – 2022-11-08 (×2): 50 mg via ORAL
  Filled 2022-11-05 (×2): qty 1

## 2022-11-05 MED ORDER — SODIUM CHLORIDE 0.9 % IV SOLN
0.1500 mg/kg/h | INTRAVENOUS | Status: DC
  Administered 2022-11-05 – 2022-11-09 (×6): 0.15 mg/kg/h via INTRAVENOUS
  Filled 2022-11-05 (×6): qty 250

## 2022-11-05 MED ORDER — ONDANSETRON HCL 4 MG/2ML IJ SOLN: 4.0000 mg | Freq: Four times a day (QID) | INTRAMUSCULAR | Status: DC | PRN

## 2022-11-05 MED ORDER — GABAPENTIN 300 MG PO CAPS
300.0000 mg | ORAL_CAPSULE | Freq: Three times a day (TID) | ORAL | Status: DC
  Administered 2022-11-05 – 2022-11-10 (×14): 300 mg via ORAL
  Filled 2022-11-05 (×14): qty 1

## 2022-11-05 MED ORDER — ACETAMINOPHEN 650 MG RE SUPP: 650.0000 mg | Freq: Four times a day (QID) | RECTAL | Status: DC | PRN

## 2022-11-05 MED ORDER — ACETAMINOPHEN 325 MG PO TABS
650.0000 mg | ORAL_TABLET | Freq: Four times a day (QID) | ORAL | Status: DC | PRN
  Administered 2022-11-07 – 2022-11-10 (×2): 650 mg via ORAL
  Filled 2022-11-05 (×2): qty 2

## 2022-11-05 MED ORDER — ONDANSETRON HCL 4 MG PO TABS: 4.0000 mg | ORAL_TABLET | Freq: Four times a day (QID) | ORAL | Status: DC | PRN

## 2022-11-05 NOTE — Consult Note (Signed)
ANTICOAGULATION CONSULT NOTE  Pharmacy Consult for Bivalirudin infusion Indication: DVT  Allergies  Allergen Reactions   Penicillins Swelling   Shrimp [Shellfish Allergy] Hives   Cisatracurium Other (See Comments)    Other reaction(s): Unknown Other reaction(s): Unknown    Heparin Hives and Swelling    Patient's daughter reports patient had swelling/hives during hospital admission in [place]. They were on several medications at once so she is unsure whether it was VANCOMYCIN that caused this reaction or another concomitant medication.    Rocuronium Other (See Comments)    Other reaction(s): Unknown Other reaction(s): Unknown    Vancomycin Hives and Swelling    Patient's daughter reports patient had swelling/hives during hospital admission in [place]. They were on several medications at once so she is unsure whether it was VANCOMYCIN that caused this reaction or another concomitant medication.    Wound Dressings Rash    Tegaderm     Patient Measurements: Height: '5\' 10"'$  (177.8 cm) Weight: 90.7 kg (200 lb) IBW/kg (Calculated) : 73   Vital Signs: Temp: 98 F (36.7 C) (12/16 1835) Temp Source: Oral (12/16 1835) BP: 117/80 (12/16 1835) Pulse Rate: 74 (12/16 1835)  Labs: Recent Labs    11/05/22 1611  HGB 9.7*  HCT 32.3*  PLT 274  APTT 20*  LABPROT 14.9  INR 1.2  CREATININE 0.91    Estimated Creatinine Clearance: 83.1 mL/min (by C-G formula based on SCr of 0.91 mg/dL).   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  Last reported dose 12/16 @ 0900  Assessment: 71 yo male with a history of recurrent lower extremity DVTs.  Presented to the ED with worsening swelling and pain in lower extremities.  Patient was recently hospitalized and underwent a thrombectomy.  Had been on Eliquis at home.  Ultrasound  confirmed DVTs.  Patient reports allergy to heparin so being started on bivalirudin.  Goal of Therapy:  aPTT 50-85 seconds Monitor platelets by anticoagulation protocol: Yes   Plan:  -Initiate bivalirudin infusion at 0.15 mg/kg/hr.  13.5 mg/hr. Start 12/16 @ 2100 (12 hours after last Eliquis dose) -Check aPTT in 4 hours after initiation of infusion   Lorin Picket, PharmD 11/05/2022,7:23 PM

## 2022-11-05 NOTE — ED Provider Notes (Addendum)
[facility] Provider Note    Event Date/Time   First MD Initiated Contact with Patient 11/05/22 1809     (approximate)   History   Rash, Groin Pain, and Leg Swelling   HPI  Russell [name] is a 71 y.o. male with history of PE, DVT, hypertension, hyperlipidemia thoracic spine fusion on prophylactic Doxy who came in with right leg swelling.  On review of records patient was admitted from 12/5 until 12/12.  Patient is remaining on Eliquis.  Patient reports being compliant with his Eliquis 10 mg twice daily.  He goes down to 5 mg twice daily starting tomorrow.  Denies any falls hitting his head.  He does report continued swelling in his right leg but now having increasing pain in his right lower abdomen and into his right groin area.  Denies any testicle pain.  His wife does report some increasing shortness of breath mostly with ambulation as well that is been going going on since having the surgery.  They also report a little bit of a rash noted today on his stomach.  They gave him some Allegra to try to help.  No rashes mouth or on his hands or feet  Physical Exam   Triage Vital Signs: ED Triage Vitals  Enc Vitals Group     BP 11/05/22 1609 106/76     Pulse Rate 11/05/22 1609 87     Resp 11/05/22 1609 18     Temp 11/05/22 1609 98.5 F (36.9 C)     Temp Source 11/05/22 1609 Oral     SpO2 11/05/22 1609 98 %     Weight 11/05/22 1604 200 lb (90.7 kg)     Height 11/05/22 1604 '5\' 10"'$  (1.778 m)     Head Circumference --      Peak Flow --      Pain Score 11/05/22 1603 8     Pain Loc --      Pain Edu? --      Excl. in [place]? --     Most recent vital signs: Vitals:   11/05/22 1609  BP: 106/76  Pulse: 87  Resp: 18  Temp: 98.5 F (36.9 C)  SpO2: 98%     General: Awake, no distress.  CV:  Good peripheral perfusion.  Resp:  Normal effort.  Abd:  No distention.  Other:  Patient has good distal pulse noted in the foot.  He has got warmth and swelling noted  to the right leg.  He is has a little bit of right groin right lower abdomen tenderness.  No swelling of the testicles   ED Results / Procedures / Treatments   Labs (all labs ordered are listed, but only abnormal results are displayed) Labs Reviewed  COMPREHENSIVE METABOLIC PANEL - Abnormal; Notable for the following components:      Result Value   Glucose, Bld 109 (*)    All other components within normal limits  CBC WITH DIFFERENTIAL/PLATELET - Abnormal; Notable for the following components:   RBC 3.38 (*)    Hemoglobin 9.7 (*)    HCT 32.3 (*)    All other components within normal limits  APTT - Abnormal; Notable for the following components:   aPTT 20 (*)    All other components within normal limits  PROTIME-INR     EKG  My interpretation of EKG:  Normal sinus rate of 81 without any ST elevation, T wave version in lead III, normal intervals  RADIOLOGY I have reviewed  the ultrasound personally interpreted and patient has DVT  PROCEDURES:  Critical Care performed: Yes, see critical care procedure note(s)  .1-3 Lead EKG Interpretation  Performed by: Vanessa [place], MD Authorized by: Vanessa [place], MD     Interpretation: normal     ECG rate:  70   ECG rate assessment: normal     Rhythm: sinus rhythm     Ectopy: none     Conduction: normal   .Critical Care  Performed by: Vanessa [place], MD Authorized by: Vanessa [place], MD   Critical care provider statement:    Critical care time (minutes):  30   Critical care was necessary to treat or prevent imminent or life-threatening deterioration of the following conditions: DVT.   Critical care was time spent personally by me on the following activities:  Development of treatment plan with patient or surrogate, discussions with consultants, evaluation of patient's response to treatment, examination of patient, ordering and review of laboratory studies, ordering and review of radiographic studies, ordering and performing  treatments and interventions, pulse oximetry, re-evaluation of patient's condition and review of old charts    [place] ED: Medications  bivalirudin (ANGIOMAX) 250 mg in sodium chloride 0.9 % 500 mL (0.5 mg/mL) infusion (has no administration in time range)  methylPREDNISolone sodium succinate (SOLU-MEDROL) 125 mg/2 mL injection 125 mg (125 mg Intravenous Given 11/05/22 2002)     IMPRESSION / MDM / [place] / ED COURSE  I reviewed the triage vital signs and the nursing notes.   Patient's presentation is most consistent with acute presentation with potential threat to life or bodily function.   Patient comes in with a concern for worsening right leg pain, swelling.  Ultrasound confirms DVT.  Discussed with Dr. Bridgett Larsson and will admit for IV anticoagulation and will most likely need conversion to Coumadin.  He will see patient tomorrow.  I did order CT PE to evaluate for pulmonary embolism and CT venogram to make sure no venous clots higher up.  Labs ordered evaluate for any anemia electrolyte abnormalities, ACS.  CMP reassuring CBC shows hemoglobin that is uptrending from few days ago.  PTT is low.  Lance Bosch is negative  I did discuss with the hospital team for admission.  His CT scans are still pending.  Unclear what is the cause of this rash but seems pretty benign in nature will give a dose of steroids to help facilitate healing.  The patient is on the cardiac monitor to evaluate for evidence of arrhythmia and/or significant heart rate changes.      FINAL CLINICAL IMPRESSION(S) / ED DIAGNOSES   Final diagnoses:  Acute deep vein thrombosis (DVT) of proximal vein of right lower extremity (HCC)  Rash     Rx / DC Orders   ED Discharge Orders     None        Note:  This document was prepared using Dragon voice recognition software and may include unintentional dictation errors.   Vanessa [place], MD 11/05/22 2008    Vanessa [place], MD 11/05/22 2009

## 2022-11-05 NOTE — ED Notes (Signed)
Interpreter used for MD/RN eval.  [id number]

## 2022-11-05 NOTE — ED Notes (Signed)
Had to stick for labs 3 times.

## 2022-11-05 NOTE — Assessment & Plan Note (Signed)
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

## 2022-11-05 NOTE — H&P (Signed)
History and Physical    Patient: Russell [name] XAJ:[id number] DOB: January 04, 1951 DOA: 11/05/2022 DOS: the patient was seen and examined on 11/05/2022 PCP: Norm Parcel, MD  Patient coming from: Home  Chief Complaint:  Chief Complaint  Patient presents with   Rash   Groin Pain   Leg Swelling   HPI: Russell [name] is a 71 y.o. male with medical history significant for recurrent lower extremity DVT, interstitial lung disease, hypertension, hyperlipidemia, who presents with pain of his right lower extremity.  Patient was recently admitted from December 5 to 12, and underwent a thrombectomy.  Of note patient was also admitted at the end of November 2023 for the same and also underwent a thrombectomy during that admission.  He was discharged on Eliquis after his most recent admission.  Patient reports that for the past 2 days he has begun to have worsening pain and swelling in his right lower extremity.  He reports he has been taking all of his medicines exactly as prescribed.  His wife endorses this as well.  He also reports no chest pain or shortness of breath while at rest, but does note exertional shortness of breath with basic activities of daily living such as going up and down the stairs.  He attributes this to being out of shape after multiple recent hospitalizations.  In the ED his vital signs were unremarkable.  CBC, CMP, and coags were normal.  Repeat lower right extremity ultrasound showed reaccumulation of occlusive thrombus in the right common femoral vein, and nonocclusive thrombi in the profunda femoral vein and posterior tibial vein.  Per ED provider signout that discussed with Dr. Bridgett Larsson from vascular surgery who recommended admission for bivalirudin gtt. for anticoagulation, and anticipate he will ultimately need to go on Coumadin.  They also said they would see the patient tomorrow.  Given presentation CTPA was also ordered as well as CT venogram of the abdomen pelvis and lower  extremities which are pending at this time.   Review of Systems: As mentioned in the history of present illness. All other systems reviewed and are negative. 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   Hyperlipidemia    Hypertension    Past Surgical History:  Procedure Laterality Date   BACK SURGERY     COLONOSCOPY WITH PROPOFOL N/A 11/05/2019   Procedure: COLONOSCOPY WITH PROPOFOL;  Surgeon: [name], MD;  Location: [facility] ENDOSCOPY;  Service: Gastroenterology;  Laterality: N/A;   PARTIAL COLECTOMY N/A 02/11/2020   Procedure: PARTIAL COLECTOMY -- sigmoid;  Surgeon: Olean Ree, MD;  Location: [facility] ORS;  Service: General;  Laterality: N/A;   PERIPHERAL VASCULAR THROMBECTOMY Right 10/07/2022   Procedure: PERIPHERAL VASCULAR THROMBECTOMY;  Surgeon: Algernon Huxley, MD;  Location: [place] [facility];  Service: Cardiovascular;  Laterality: Right;   PERIPHERAL VASCULAR THROMBECTOMY N/A 10/27/2022   Procedure: PERIPHERAL VASCULAR THROMBECTOMY;  Surgeon: Algernon Huxley, MD;  Location: [place] [facility];  Service: Cardiovascular;  Laterality: N/A;   SPINE SURGERY     TAKE DOWN OF INTESTINAL FISTULA N/A 02/11/2020   Procedure: TAKE DOWN OF INTESTINAL FISTULA -- colovesical fistula;  Surgeon: Olean Ree, MD;  Location: [facility] ORS;  Service: General;  Laterality: N/A;   TRACHEAL SURGERY     Social History:  reports that he has never smoked. He has never used smokeless tobacco. He reports that he does not drink alcohol and does not use drugs.  Allergies  Allergen Reactions   Penicillins Swelling   Shrimp [Shellfish Allergy] Hives   Cisatracurium Other (See Comments)    Other reaction(s): Unknown Other reaction(s): Unknown    Heparin Hives and Swelling    Patient's daughter reports patient had swelling/hives during hospital admission in [place]. They were on several medications at once so she is unsure whether it was VANCOMYCIN that  caused this reaction or another concomitant medication.    Rocuronium Other (See Comments)    Other reaction(s): Unknown Other reaction(s): Unknown    Vancomycin Hives and Swelling    Patient's daughter reports patient had swelling/hives during hospital admission in [place]. They were on several medications at once so she is unsure whether it was VANCOMYCIN that caused this reaction or another concomitant medication.    Wound Dressings Rash    Tegaderm     Family History  Problem Relation Age of Onset   Prostate cancer Neg Hx    Bladder Cancer Neg Hx    Kidney cancer Neg Hx     Prior to Admission medications   Medication Sig Start Date End Date Taking? Authorizing Provider  doxycycline (VIBRAMYCIN) 100 MG capsule Take 100 mg by mouth daily. 10/17/22  Yes [provider]  methylPREDNISolone (MEDROL DOSEPAK) 4 MG TBPK tablet Take 4-24 mg by mouth daily. 6 Day Taper 11/04/22  Yes [provider]  apixaban (ELIQUIS) 5 MG TABS tablet Take 2 tablets (10 mg total) by mouth 2 (two) times daily for 3 days. 10/31/22 11/03/22  Jennye Boroughs, MD  apixaban (ELIQUIS) 5 MG TABS tablet Take 1 tablet (5 mg total) by mouth 2 (two) times daily. 11/04/22   Jennye Boroughs, MD  gabapentin (NEURONTIN) 300 MG capsule Take 300 mg by mouth 3 (three) times daily.    [provider]  lidocaine (XYLOCAINE) 5 % ointment Apply 1 Application topically 3 (three) times daily as needed for mild pain. 09/28/22   [provider]  potassium chloride (KLOR-CON M) 20 MEQ tablet Take 1 tablet (20 mEq total) by mouth daily for 3 days. 10/31/22 11/03/22  Jennye Boroughs, MD    Physical Exam: Vitals:   11/05/22 1835 11/05/22 1900 11/05/22 1930 11/05/22 2006  BP: 117/80 110/75    Pulse: 74 (!) 105 81 77  Resp: 20   18  Temp: 98 F (36.7 C)     TempSrc: Oral     SpO2: 99%   98%  Weight:      Height:       Physical Exam Constitutional:      General: He is not in acute distress.     Appearance: Normal appearance. He is not ill-appearing, toxic-appearing or diaphoretic.  Eyes:     General: No scleral icterus. Cardiovascular:     Rate and Rhythm: Normal rate and regular rhythm.     Heart sounds: Normal heart sounds.     Comments: Bilateral dorsalis pedis pulses 2+ Pulmonary:     Effort: Pulmonary effort is normal. No respiratory distress.     Breath sounds: Normal breath sounds. No wheezing or rales.  Abdominal:     General: Abdomen is flat. Bowel sounds are normal. There is no distension.     Palpations: Abdomen is soft. There is no mass.  Musculoskeletal:     Right lower leg: Edema present.     Left lower leg: No edema.     Comments: Notable swelling of right lower extremity along with erythema  Skin:    General: Skin is warm and  dry.  Neurological:     General: No focal deficit present.     Mental Status: He is alert.     Comments: Sensation intact to light touch in both lower extremities unable to move bilateral lower extremities without issue  Psychiatric:        Mood and Affect: Mood normal.        Behavior: Behavior normal.      Data Reviewed: Results for orders placed or performed during the hospital encounter of 11/05/22 (from the past 24 hour(s))  Comprehensive metabolic panel     Status: Abnormal   Collection Time: 11/05/22  4:11 PM  Result Value Ref Range   Sodium 139 135 - 145 mmol/L   Potassium 4.5 3.5 - 5.1 mmol/L   Chloride 109 98 - 111 mmol/L   CO2 22 22 - 32 mmol/L   Glucose, Bld 109 (H) 70 - 99 mg/dL   BUN 15 8 - 23 mg/dL   Creatinine, Ser 0.91 0.61 - 1.24 mg/dL   Calcium 8.9 8.9 - 10.3 mg/dL   Total Protein 7.1 6.5 - 8.1 g/dL   Albumin 4.0 3.5 - 5.0 g/dL   AST 15 15 - 41 U/L   ALT 15 0 - 44 U/L   Alkaline Phosphatase 74 38 - 126 U/L   Total Bilirubin 1.0 0.3 - 1.2 mg/dL   GFR, Estimated >60 >60 mL/min   Anion gap 8 5 - 15  Protime-INR     Status: None   Collection Time: 11/05/22  4:11 PM  Result Value Ref Range    Prothrombin Time 14.9 11.4 - 15.2 seconds   INR 1.2 0.8 - 1.2  CBC with Differential     Status: Abnormal   Collection Time: 11/05/22  4:11 PM  Result Value Ref Range   WBC 5.6 4.0 - 10.5 K/uL   RBC 3.38 (L) 4.22 - 5.81 MIL/uL   Hemoglobin 9.7 (L) 13.0 - 17.0 g/dL   HCT 32.3 (L) 39.0 - 52.0 %   MCV 95.6 80.0 - 100.0 fL   MCH 28.7 26.0 - 34.0 pg   MCHC 30.0 30.0 - 36.0 g/dL   RDW 14.3 11.5 - 15.5 %   Platelets 274 150 - 400 K/uL   nRBC 0.0 0.0 - 0.2 %   Neutrophils Relative % 57 %   Neutro Abs 3.2 1.7 - 7.7 K/uL   Lymphocytes Relative 23 %   Lymphs Abs 1.3 0.7 - 4.0 K/uL   Monocytes Relative 14 %   Monocytes Absolute 0.8 0.1 - 1.0 K/uL   Eosinophils Relative 5 %   Eosinophils Absolute 0.3 0.0 - 0.5 K/uL   Basophils Relative 1 %   Basophils Absolute 0.1 0.0 - 0.1 K/uL   Immature Granulocytes 0 %   Abs Immature Granulocytes 0.01 0.00 - 0.07 K/uL  APTT     Status: Abnormal   Collection Time: 11/05/22  4:11 PM  Result Value Ref Range   aPTT 20 (L) 24 - 36 seconds  Troponin I (High Sensitivity)     Status: None   Collection Time: 11/05/22  7:30 PM  Result Value Ref Range   Troponin I (High Sensitivity) 5 <18 ng/L   US Venous Img Lower Unilateral Right  Result Date: 11/05/2022 CLINICAL DATA:  History of DVT. Recent thrombectomy. Increased groin pain. EXAM: RIGHT LOWER EXTREMITY VENOUS DOPPLER ULTRASOUND TECHNIQUE: Gray-scale sonography with compression, as well as color and duplex ultrasound, were performed to evaluate the deep venous system(s) from the level of  the common femoral vein through the popliteal and proximal calf veins. COMPARISON:  Right lower extremity venous Doppler ultrasound 10/25/2022 FINDINGS: VENOUS There is occlusive thrombus within the common femoral vein. There is nonocclusive thrombus within the profunda femoral vein and femoral vein proximally, but normal flow distally. There is normal flow and compressibility throughout the popliteal vein and saphenofemoral  junction. There is also nonocclusive thrombus within the posterior tibial vein. The peroneal vein is not visualized. Limited views of the contralateral common femoral vein were unable to be obtained secondary to patient pain. OTHER None. Limitations: none IMPRESSION: 1. Occlusive thrombus within the right common femoral vein. 2. Nonocclusive thrombus within the profunda femoral vein and femoral vein proximally. 3. Nonocclusive thrombus within the posterior tibial vein. Electronically Signed   By: Ronney Asters M.D.   On: 11/05/2022 17:27   ECHOCARDIOGRAM COMPLETE  Result Date: 10/29/2022    ECHOCARDIOGRAM REPORT   Patient Name:   Russell [name] Date of Exam: 10/29/2022 Medical Rec #:  [id number]      Height:       70.0 in Accession #:    [id number]     Weight:       213.4 lb Date of Birth:  1951-03-14      BSA:          2.146 m Patient Age:    22 years       BP:           102/66 mmHg Patient Gender: M              HR:           78 bpm. Exam Location:  [facility] Procedure: 2D Echo and Strain Analysis Indications:     Shock  History:         Patient has prior history of Echocardiogram examinations, most                  recent 04/07/2022.  Sonographer:     Kathlen Brunswick RDCS Referring Phys:  [id number] Bradly Bienenstock Diagnosing Phys: Ida Rogue MD  Sonographer Comments: Global longitudinal strain was attempted. IMPRESSIONS  1. Left ventricular ejection fraction, by estimation, is 60 to 65%. The left ventricle has normal function. The left ventricle has no regional wall motion abnormalities. There is moderate left ventricular hypertrophy. Left ventricular diastolic parameters are consistent with Grade I diastolic dysfunction (impaired relaxation). The average left ventricular global longitudinal strain is -23.8 %.  2. Right ventricular systolic function is normal. The right ventricular size is normal. Tricuspid regurgitation signal is inadequate for assessing PA pressure.  3. Left atrial size was mildly dilated.  4.  The mitral valve is normal in structure. Mild to moderate mitral valve regurgitation. No evidence of mitral stenosis.  5. The aortic valve is normal in structure. Aortic valve regurgitation is not visualized. Aortic valve sclerosis is present, with no evidence of aortic valve stenosis.  6. There is mild dilatation of the aortic root, measuring 40 mm.  7. The inferior vena cava is normal in size with greater than 50% respiratory variability, suggesting right atrial pressure of 3 mmHg. FINDINGS  Left Ventricle: Left ventricular ejection fraction, by estimation, is 60 to 65%. The left ventricle has normal function. The left ventricle has no regional wall motion abnormalities. The average left ventricular global longitudinal strain is -23.8 %. The left ventricular internal cavity size was normal in size. There is moderate left ventricular hypertrophy. Left ventricular diastolic parameters are consistent with Grade  I diastolic dysfunction (impaired relaxation). Right Ventricle: The right ventricular size is normal. No increase in right ventricular wall thickness. Right ventricular systolic function is normal. Tricuspid regurgitation signal is inadequate for assessing PA pressure. Left Atrium: Left atrial size was mildly dilated. Right Atrium: Right atrial size was normal in size. Pericardium: There is no evidence of pericardial effusion. Mitral Valve: The mitral valve is normal in structure. Mild to moderate mitral valve regurgitation. No evidence of mitral valve stenosis. Tricuspid Valve: The tricuspid valve is normal in structure. Tricuspid valve regurgitation is mild . No evidence of tricuspid stenosis. Aortic Valve: The aortic valve is normal in structure. Aortic valve regurgitation is not visualized. Aortic valve sclerosis is present, with no evidence of aortic valve stenosis. Aortic valve mean gradient measures 7.0 mmHg. Aortic valve peak gradient measures 14.1 mmHg. Aortic valve area, by VTI measures 2.89 cm.  Pulmonic Valve: The pulmonic valve was normal in structure. Pulmonic valve regurgitation is not visualized. No evidence of pulmonic stenosis. Aorta: The aortic root is normal in size and structure. There is mild dilatation of the aortic root, measuring 40 mm. Venous: The inferior vena cava is normal in size with greater than 50% respiratory variability, suggesting right atrial pressure of 3 mmHg. IAS/Shunts: No atrial level shunt detected by color flow Doppler.  LEFT VENTRICLE PLAX 2D LVIDd:         4.20 cm     Diastology LVIDs:         2.90 cm     LV e' medial:    8.81 cm/s LV PW:         1.50 cm     LV E/e' medial:  9.2 LV IVS:        1.40 cm     LV e' lateral:   11.00 cm/s LVOT diam:     2.20 cm     LV E/e' lateral: 7.4 LV SV:         103 LV SV Index:   48          2D Longitudinal Strain LVOT Area:     3.80 cm    2D Strain GLS Avg:     -23.8 %  LV Volumes (MOD) LV vol d, MOD A2C: 77.1 ml LV vol d, MOD A4C: 85.9 ml LV vol s, MOD A2C: 34.0 ml LV vol s, MOD A4C: 27.6 ml LV SV MOD A2C:     43.1 ml LV SV MOD A4C:     85.9 ml LV SV MOD BP:      52.8 ml RIGHT VENTRICLE RV Basal diam:  2.50 cm RV S prime:     14.90 cm/s TAPSE (M-mode): 1.6 cm LEFT ATRIUM             Index        RIGHT ATRIUM           Index LA diam:        4.40 cm 2.05 cm/m   RA Area:     15.50 cm LA Vol (A2C):   61.1 ml 28.48 ml/m  RA Volume:   30.80 ml  14.35 ml/m LA Vol (A4C):   79.0 ml 36.82 ml/m LA Biplane Vol: 73.8 ml 34.39 ml/m  AORTIC VALVE                     PULMONIC VALVE AV Area (Vmax):    2.47 cm      PV Vmax:  1.06 m/s AV Area (Vmean):   2.61 cm      PV Peak grad:  4.5 mmHg AV Area (VTI):     2.89 cm AV Vmax:           188.00 cm/s AV Vmean:          120.000 cm/s AV VTI:            0.355 m AV Peak Grad:      14.1 mmHg AV Mean Grad:      7.0 mmHg LVOT Vmax:         122.00 cm/s LVOT Vmean:        82.400 cm/s LVOT VTI:          0.270 m LVOT/AV VTI ratio: 0.76  AORTA Ao Root diam: 4.00 cm Ao Asc diam:  3.10 cm MITRAL VALVE MV Area  (PHT): 3.54 cm     SHUNTS MV Decel Time: 214 msec     Systemic VTI:  0.27 m MV E velocity: 81.20 cm/s   Systemic Diam: 2.20 cm MV A velocity: 121.00 cm/s MV E/A ratio:  0.67 Ida Rogue MD Electronically signed by Ida Rogue MD Signature Date/Time: 10/29/2022/12:23:32 PM    Final    PERIPHERAL VASCULAR CATHETERIZATION  Result Date: 10/27/2022 See surgical note for result.  US Venous Img Lower Unilateral Right  Result Date: 10/25/2022 CLINICAL DATA:  Pain and swelling EXAM: Right LOWER EXTREMITY VENOUS DOPPLER ULTRASOUND TECHNIQUE: Gray-scale sonography with compression, as well as color and duplex ultrasound, were performed to evaluate the deep venous system(s) from the level of the common femoral vein through the popliteal and proximal calf veins. COMPARISON:  None Available. FINDINGS: VENOUS There is evidence of occlusive deep venous thrombosis in right common femoral vein. Nonocclusive DVT is noted in the proximal femoral vein. Rest of the major deep veins appear patent. There is nonocclusive superficial phlebitis in greater saphenous vein. Limited views of the contralateral common femoral vein are unremarkable. OTHER None. Limitations: none IMPRESSION: Occlusive DVT is seen in right common femoral vein. Nonocclusive DVT is noted in proximal course of right femoral vein. Superficial phlebitis is noted in the right greater saphenous vein. Electronically Signed   By: Elmer Picker M.D.   On: 10/25/2022 13:33   DG Chest 2 View  Result Date: 10/25/2022 CLINICAL DATA:  Cough and shortness of breath.  Leg swelling. EXAM: CHEST - 2 VIEW COMPARISON:  06/11/2022 FINDINGS: Thoracic spine fixation, suboptimally evaluated. Upper thoracic vertebral corpectomy. IVC filter. Patient rotated to the right. Mild cardiomegaly. No pleural effusion or pneumothorax. Peripheral and basilar predominant interstitial thickening likely corresponds to interstitial lung disease on 10/06/2022 CT. No superimposed  congestive failure or lobar consolidation. IMPRESSION: Interstitial lung disease, grossly similar to the prior CTA chest. Consider pulmonology consultation for eventual dedicated high-resolution chest CT. Cardiomegaly without superimposed congestive failure or acute process. Electronically Signed   By: Abigail Miyamoto M.D.   On: 10/25/2022 12:26   PERIPHERAL VASCULAR CATHETERIZATION  Result Date: 10/07/2022 See surgical note for result.     Assessment and Plan: Russell [name] is a 70 y.o. male with medical history significant for recurrent lower extremity DVT, interstitial lung disease, hypertension, hyperlipidemia, who presents with pain of his right lower extremity concerning for Eliquis treatment failure.  * 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 [facility]) Patient recently discharged and reportedly compliant with his Eliquis, concerning for treatment failure.  Per initial verbal consultation with vascular surgeon repeat thrombectomy seems unlikely, will likely  need to be transition to Coumadin. - Bivalirudin gtt per pharmacy - Follow-up results of CTPA and CT venogram abdomen/pelvis/lower extremities - Follow-up vascular surgery recommendations  Known medical problems Suppressive therapy for prior MRSA infection-continue daily doxycycline Neuropathy-continue gabapentin      Advance Care Planning:   Code Status: Full Code , confirmed with patient during interview  Consults: Vascular Surgery  Family Communication: Wife updated at bedside, daughter Russell [name] by phone. NB: daughter would like to be updated as often as possible, she lives in [place] but is very involved in her father's care.   Severity of Illness: The appropriate patient status for this patient is INPATIENT. Inpatient status is judged to be reasonable and necessary in order to provide the required intensity of service to ensure the patient's safety. The patient's presenting symptoms, physical exam findings,  and initial radiographic and laboratory data in the context of their chronic comorbidities is felt to place them at high risk for further clinical deterioration. Furthermore, it is not anticipated that the patient will be medically stable for discharge from the hospital within 2 midnights of admission.   * I certify that at the point of admission it is my clinical judgment that the patient will require inpatient hospital care spanning beyond 2 midnights from the point of admission due to high intensity of service, high risk for further deterioration and high frequency of surveillance required.*  Author: Clarnce Flock, MD 11/05/2022 8:17 PM  For on call review [email or web address].

## 2022-11-05 NOTE — Assessment & Plan Note (Signed)
Patient recently discharged and reportedly compliant with his Eliquis, concerning for treatment failure.  Per initial verbal consultation with vascular surgeon repeat thrombectomy seems unlikely, will likely need to be transition to Coumadin. - Bivalirudin gtt per pharmacy - Follow-up results of CTPA and CT venogram abdomen/pelvis/lower extremities - Follow-up vascular surgery recommendations

## 2022-11-05 NOTE — ED Triage Notes (Signed)
Pt to ED with wife, Spanish speaking, pt has R groin pain since last night especially with lifting leg. Pt has new splotchy, itchy rash to abdomen since last night.  Pt has bilateral LE swelling, worse on R leg. Marland Kitchen Recently had 2 thrombectomies to R leg for DVT, last was about 1 week ago. Lower legs are nontender to palpation. Both are swollen, R worse.  Wife gave pt 1 dose of claritin today (1 hr ago).  Pt also becomes dyspneic with exertion (this is his baseline).  Wife unsure but thinks pt may have had cardiac arrest during last thrombectomy. Pt has allergy to heparin and 2 paralytics, listed in med hx.  PA saw pt at bedside.

## 2022-11-05 NOTE — ED Provider Triage Note (Signed)
Emergency Medicine Provider Triage Evaluation Note  Russell [name] , a 71 y.o. male  was evaluated in triage.  Pt complains of right groin/upper leg pain, rash to the abdomen.  Patient was recently in this department for repeat DVT.  He has had 2 thrombectomies in the last 2 months.  Is on anticoagulation.  Having increasing pain in the groin and upper leg, increased edema in the right lower extremity.  No abdominal pain or chest pain.  Patient also notes a erythematous rash to the abdomen that is developed that is nonpruritic and nonpainful.  No other symptoms of fevers, chills.  Review of Systems  Positive: Increased right lower extremity edema, right groin/leg pain, rash Negative: Fever, chills, chest pain, shortness of breath  Physical Exam  There were no vitals taken for this visit. Gen:   Awake, no distress   Resp:  Normal effort  MSK:   Moves extremities without difficulty  Other:    Medical Decision Making  Medically screening exam initiated at 4:05 PM.  Appropriate orders placed.  Russell [name] was informed that the remainder of the evaluation will be completed by another provider, this initial triage assessment does not replace that evaluation, and the importance of remaining in the ED until their evaluation is complete.  Patient presents with increasing right groin/upper leg pain, edema of the lower extremity.  He has a history of repeat DVT despite anticoagulation use.  Recent thrombectomy.  Patient will have labs, ultrasound at this time.  No chest pain or shortness of breath.  Will hold off on PE study at this time until patient's roomed   Russell [name], Russell [name] 11/05/22 1605

## 2022-11-05 NOTE — Progress Notes (Signed)
Subjective:    Patient ID: Russell [name], male    DOB: 19-Sep-1951, 71 y.o.   [name]: [id number] Chief Complaint  Patient presents with   New Patient (Initial Visit)    [place] follow up for dvt    Russell [name] is a 71 year old male who returns today for follow-up of pulmonary embolism and DVT.  The patient originally presented on 10/06/2022 due to having pain and swelling in the right lower extremity.  He had previously had a deep vein thrombosis but it was noted that he had stopped taking his Eliquis.  He underwent extensive intervention on 10/07/2022 including:  PROCEDURE: 1.   US guidance for vascular access to right popliteal vein 2.   Catheter placement into right common iliac vein from right popliteal approach 3.   IVC gram and right lower extremity venogram 4.   Catheter directed thrombolysis with 8 mg of tPA  5.   Mechanical thrombectomy to right superficial femoral vein, common femoral vein, external iliac vein, common iliac vein, and inferior vena cava with the penumbra 16 device  The patient subsequently represented to the emergency room on 10/28/2022 with worsening pain and swelling in the right lower extremity including:   PROCEDURE: 1.   US guidance for vascular access to right popliteal vein 2.   Catheter placement into right common femoral vein vein from right popliteal approach 3.   IVC gram and right lower extremity venogram 4.   Catheter directed thrombolysis with 8 mg of tPA to the iliac and common femoral veins 5.   Mechanical thrombectomy to right common femoral vein, iliac veins, and IVC with the penumbra 16 lightening device 6.   PTA of the common femoral vein and distal external iliac vein with 12 mm balloon   Today he notes that he has been compliant with his Eliquis.  He has some complaints of pain in the groin area as well as a rash.  He notes that he still has some swelling in the right leg but it is improved.  He notes that the pain in his groin is more so  when he palpates the area.  Today, his noninvasive studies are consistent with an acute DVT in the right common femoral vein.  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  The studies are improvement from his recent intervention.    Review of Systems  Cardiovascular:  Positive for leg swelling.  Skin:  Positive for rash.  All other systems reviewed and are negative.      Objective:   Physical Exam Vitals reviewed.  HENT:     Head: Normocephalic.  Cardiovascular:     Rate and Rhythm: Normal rate.  Pulmonary:     Effort: Pulmonary effort is normal.  Musculoskeletal:     Right lower leg: Edema present.  Skin:    General: Skin is warm and dry.     Findings: Rash present.  Neurological:     Mental Status: He is alert and oriented to person, place, and time.  Psychiatric:        Mood and Affect: Mood normal.        Behavior: Behavior normal.        Thought Content: Thought content normal.        Judgment: Judgment normal.     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   Hyperlipidemia    Hypertension     Social History   Socioeconomic History   Marital status: Married    Spouse name: Not on file   Number of children: Not on file   Years of education: Not on file   Highest education level: Not on file  Occupational History   Not on file  Tobacco Use   Smoking status: Never   Smokeless tobacco: Never  Vaping Use   Vaping Use: Never used  Substance and Sexual Activity   Alcohol use: No   Drug use: No   Sexual activity: Yes    Birth control/protection: None  Other Topics Concern   Not on file  Social History Narrative   Not on file   Social Determinants of Health   Financial Resource Strain: Not on file  Food  Insecurity: No Food Insecurity (10/26/2022)   Hunger Vital Sign    Worried About Running Out of Food in the Last Year: Never true    Ran Out of Food in the Last Year: Never true  Transportation Needs: No Transportation Needs (10/26/2022)   PRAPARE - Hydrologist (Medical): No    Lack of Transportation (Non-Medical): No  Physical Activity: Not on file  Stress: Not on file  Social Connections: Not on file  Intimate Partner Violence: Not At Risk (10/26/2022)   Humiliation, Afraid, Rape, and Kick questionnaire    Fear of Current or Ex-Partner: No    Emotionally Abused: No    Physically Abused: No    Sexually Abused: No    Past Surgical History:  Procedure Laterality Date   BACK SURGERY     COLONOSCOPY WITH PROPOFOL N/A 11/05/2019   Procedure: COLONOSCOPY WITH PROPOFOL;  Surgeon: [name], MD;  Location: [facility] ENDOSCOPY;  Service: Gastroenterology;  Laterality: N/A;   PARTIAL COLECTOMY N/A 02/11/2020   Procedure: PARTIAL COLECTOMY -- sigmoid;  Surgeon: Olean Ree, MD;  Location: [facility] ORS;  Service: General;  Laterality: N/A;   PERIPHERAL VASCULAR THROMBECTOMY Right 10/07/2022   Procedure: PERIPHERAL VASCULAR THROMBECTOMY;  Surgeon: Algernon Huxley, MD;  Location: [place] [facility];  Service: Cardiovascular;  Laterality: Right;   PERIPHERAL VASCULAR THROMBECTOMY N/A 10/27/2022   Procedure: PERIPHERAL VASCULAR THROMBECTOMY;  Surgeon: Algernon Huxley, MD;  Location: [place] [facility];  Service: Cardiovascular;  Laterality: N/A;   SPINE SURGERY     TAKE DOWN OF INTESTINAL FISTULA N/A 02/11/2020   Procedure: TAKE DOWN OF INTESTINAL FISTULA -- colovesical fistula;  Surgeon: Olean Ree, MD;  Location: [facility] ORS;  Service: General;  Laterality: N/A;   TRACHEAL SURGERY      Family History  Problem Relation Age of Onset   Prostate cancer Neg Hx    Bladder Cancer Neg Hx    Kidney cancer Neg Hx     Allergies  Allergen Reactions   Penicillins Swelling   Shrimp  [Shellfish Allergy] Hives   Cisatracurium Other (See Comments)    Other reaction(s): Unknown Other reaction(s): Unknown    Heparin Hives and Swelling    Patient's daughter reports patient had swelling/hives during hospital admission in [place]. They were on several medications at once so she is unsure whether it was VANCOMYCIN that caused this reaction or another concomitant medication.    Rocuronium Other (See Comments)    Other  reaction(s): Unknown Other reaction(s): Unknown    Vancomycin Hives and Swelling    Patient's daughter reports patient had swelling/hives during hospital admission in [place]. They were on several medications at once so she is unsure whether it was VANCOMYCIN that caused this reaction or another concomitant medication.    Wound Dressings Rash    Tegaderm        Latest Ref Rng & Units 10/31/2022    6:37 AM 10/30/2022    5:17 AM 10/29/2022   11:24 AM  CBC  WBC 4.0 - 10.5 K/uL 4.9  5.7    Hemoglobin 13.0 - 17.0 g/dL 8.4  8.5  8.7   Hematocrit 39.0 - 52.0 % 27.4  27.7  27.4   Platelets 150 - 400 K/uL 167  175        CMP     Component Value Date/Time   NA 145 10/31/2022 0637   NA 143 10/14/2019 1424   K 3.4 (L) 10/31/2022 1258   CL 116 (H) 10/31/2022 0637   CO2 25 10/31/2022 0637   GLUCOSE 92 10/31/2022 0637   BUN 19 10/31/2022 0637   BUN 16 10/14/2019 1424   CREATININE 0.87 10/31/2022 0637   CALCIUM 8.2 (L) 10/31/2022 0637   PROT 7.2 10/25/2022 1144   PROT 6.8 10/14/2019 1424   ALBUMIN 4.0 10/25/2022 1144   ALBUMIN 4.3 10/14/2019 1424   AST 14 (L) 10/25/2022 1144   ALT 20 10/25/2022 1144   ALKPHOS 75 10/25/2022 1144   BILITOT 1.1 10/25/2022 1144   BILITOT 0.4 10/14/2019 1424   GFRNONAA >60 10/31/2022 0637   GFRAA >60 05/03/2020 0604     No results found.     Assessment & Plan:   1. Deep vein thrombosis (DVT) of femoral vein of right lower extremity, unspecified chronicity (HCC) I discussed with patient and strongly reminding him that he  must continue his Eliquis without stopping his doses.  We suspect that his recurrent DVTs have been due to him stopping his anticoagulation.  This was repeatedly stressed to the patient.  Based on his noninvasive studies his DVTs are resolving but it is an a evolving state from acute to chronic.  Based on this no further intervention is indicated at this time. - VAS [place] LOWER EXTREMITY VENOUS (DVT)  2. Primary hypertension Continue antihypertensive medications as already ordered, these medications have been reviewed and there are no changes at this time.    3. Pain in joint involving right pelvic region and thigh This is unrelated to his recent procedures.  Suspect this may be related to joint pain or possible hernia.  Patient is advised to follow-up with his primary care provider.  4. Rash and nonspecific skin eruption I suspect this may be related to contrast dye recently use.  We will send the patient a Medrol Dosepak  Current Outpatient Medications on File Prior to Visit  Medication Sig Dispense Refill   apixaban (ELIQUIS) 5 MG TABS tablet Take 1 tablet (5 mg total) by mouth 2 (two) times daily. 60 tablet 0   doxycycline (VIBRAMYCIN) 100 MG capsule Take 100 mg by mouth daily.     gabapentin (NEURONTIN) 300 MG capsule Take 300 mg by mouth 3 (three) times daily.     lidocaine (XYLOCAINE) 5 % ointment Apply 1 Application topically 3 (three) times daily as needed for mild pain.     apixaban (ELIQUIS) 5 MG TABS tablet Take 2 tablets (10 mg total) by mouth 2 (two) times daily for 3 days.  12 tablet 0   potassium chloride (KLOR-CON M) 20 MEQ tablet Take 1 tablet (20 mEq total) by mouth daily for 3 days. 3 tablet 0   No current facility-administered medications on file prior to visit.    There are no Patient Instructions on file for this visit. No follow-ups on file.   [name], NP

## 2022-11-06 DIAGNOSIS — I82401 Acute embolism and thrombosis of unspecified deep veins of right lower extremity: Secondary | ICD-10-CM | POA: Diagnosis not present

## 2022-11-06 LAB — CBC
HCT: 30.5 % — ABNORMAL LOW (ref 39.0–52.0)
Hemoglobin: 9.4 g/dL — ABNORMAL LOW (ref 13.0–17.0)
MCH: 28.8 pg (ref 26.0–34.0)
MCHC: 30.8 g/dL (ref 30.0–36.0)
MCV: 93.6 fL (ref 80.0–100.0)
Platelets: 249 10*3/uL (ref 150–400)
RBC: 3.26 MIL/uL — ABNORMAL LOW (ref 4.22–5.81)
RDW: 14.2 % (ref 11.5–15.5)
WBC: 3.6 10*3/uL — ABNORMAL LOW (ref 4.0–10.5)
nRBC: 0 % (ref 0.0–0.2)

## 2022-11-06 LAB — APTT
aPTT: 61 seconds — ABNORMAL HIGH (ref 24–36)
aPTT: 58 seconds — ABNORMAL HIGH (ref 24–36)
aPTT: 52 seconds — ABNORMAL HIGH (ref 24–36)

## 2022-11-06 MED ORDER — HYDROCOD POLI-CHLORPHE POLI ER 10-8 MG/5ML PO SUER: 5.0000 mL | Freq: Two times a day (BID) | ORAL | Status: DC | PRN

## 2022-11-06 MED ORDER — BUDESONIDE 0.5 MG/2ML IN SUSP: 0.5000 mg | Freq: Two times a day (BID) | RESPIRATORY_TRACT | Status: DC

## 2022-11-06 MED ORDER — PREDNISONE 20 MG PO TABS
40.0000 mg | ORAL_TABLET | Freq: Every day | ORAL | Status: AC
  Administered 2022-11-06 – 2022-11-09 (×3): 40 mg via ORAL
  Filled 2022-11-06 (×3): qty 2

## 2022-11-06 MED ORDER — IPRATROPIUM-ALBUTEROL 0.5-2.5 (3) MG/3ML IN SOLN
3.0000 mL | Freq: Three times a day (TID) | RESPIRATORY_TRACT | Status: DC
  Administered 2022-11-07: 3 mL via RESPIRATORY_TRACT
  Filled 2022-11-06: qty 3

## 2022-11-06 MED ORDER — SODIUM CHLORIDE 0.9 % IV SOLN
2.0000 g | INTRAVENOUS | Status: DC
  Administered 2022-11-06: 2 g via INTRAVENOUS
  Filled 2022-11-06: qty 20

## 2022-11-06 MED ORDER — CIPROFLOXACIN HCL 500 MG PO TABS: 500.0000 mg | ORAL_TABLET | Freq: Two times a day (BID) | ORAL | Status: DC

## 2022-11-06 MED ORDER — FAMOTIDINE 20 MG PO TABS
20.0000 mg | ORAL_TABLET | Freq: Every day | ORAL | Status: DC
  Administered 2022-11-06 – 2022-11-10 (×4): 20 mg via ORAL
  Filled 2022-11-06 (×4): qty 1

## 2022-11-06 MED ORDER — BUDESONIDE 0.5 MG/2ML IN SUSP
0.5000 mg | Freq: Two times a day (BID) | RESPIRATORY_TRACT | Status: DC
  Administered 2022-11-06 – 2022-11-08 (×4): 0.5 mg via RESPIRATORY_TRACT
  Filled 2022-11-06 (×4): qty 2

## 2022-11-06 MED ORDER — IPRATROPIUM-ALBUTEROL 0.5-2.5 (3) MG/3ML IN SOLN
3.0000 mL | Freq: Four times a day (QID) | RESPIRATORY_TRACT | Status: DC
  Administered 2022-11-06 (×2): 3 mL via RESPIRATORY_TRACT
  Filled 2022-11-06 (×2): qty 3

## 2022-11-06 NOTE — Progress Notes (Addendum)
Progress Note   Patient: Russell [name] JHE:[id number] DOB: 1951/01/01 DOA: 11/05/2022     1 DOS: the patient was seen and examined on 11/06/2022   Brief hospital course: 71 year old Spanish speaking male with PMH of HTN, HL, RA. Chronic Interstitial Lung Disease, 03/2022 LLE DVT and 09/2022 RLE DVT that extended from the common femoral vein to the calf s/p IV Bivalirduin and mechanical thrombectomy presents to the ED on 12/16 with an occlusive DVT in the right common femoral vein and nonocclusive thrombus in the superficial femoral veins.  This is his his third DVT in two months while on Eliquis.  He was briefly not taking Eliquis due to finances.  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 - Currently there is little evidence available to guide treatment of recurrent VTE events.  According to the 2016 ACCP guidelines, patients with recurrent VTE during DOAC therapy can make a temporary switch to LMWH for at least one month.  This will need to be bridge to PO Warfarin.   - I spoke with Heme/Onc outpatient Dr. Janese Banks who recommends discharge on Heparin drip and follow up with her outpatient. - Vascular Surgery is also following and documented that they may consider another thrombectomy or IVC filter removal, appreciate.  We will touch base with Vascular on Monday.  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 - This is Day 2 out of 5 of steroids.  Bilateral Lower Lobe Pneumonia: Patient has severe allergy documented as DRESS to penicillin and cephalosporin and carbapenems and vancomycin.  I asked pharmacy to add these allergies. - Start Ciprofloxacin 500 mg BID x 5 days and continue home Doxycycline.  This is Day 2 out of 5.  Acute on  Chronic Cough: - Give Tussionex BID PRN.  GERD:  - Start Pepcid and see if this helps relieve cough symptoms.  Avoid Protonix given chronic antibiotic use and association with Cdiff.  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  Stage 3 Sacral Ulcers: - Consult wound care.  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  He has mild cough and mild wheezing on exam.  He endorses shortness of breath "occasionally."  Plan of care was discussed with daughter over the phone.  All questions were answered.  Physical Exam: Vitals:   11/05/22 2131 11/05/22 2202 11/06/22 0528 11/06/22 0604  BP: 115/80 122/84 (!) 86/58 97/65  Pulse: 80 82 79   Resp: '18 17 16   '$ Temp:  97.7 F (36.5 C) 97.6 F (36.4 C)   TempSrc:      SpO2: 95% 97% 95%   Weight:      Height:       Examination: General exam: chronically ill appearing fatigued and deconditioned HEENT: NCAT, PERRL Respiratory system: mild expiratory wheezing Cardiovascular system: Did not appreciate a murmur, regular, No JVD. Gastrointestinal system: No flank pain, Abdomen soft, NT,ND, BS+. Nervous System: No focal deficits. Extremities: RLE pain and tenderness to palpation, pulses intact Skin: No rashes, scattered bruises MSK: deconditioning   Data Reviewed:  There are no new results to review at  this time.  CT Angio Chest PE W and/or Wo Contrast CLINICAL DATA:  Right lower extremity swelling. Pulmonary embolism suspected. High probability. History of thoracic spine fusion surgery.  EXAM: CT ANGIOGRAPHY CHEST WITH CONTRAST  TECHNIQUE: Multidetector CT imaging of the chest was performed using the standard protocol during bolus administration of intravenous contrast. Multiplanar CT image  reconstructions and MIPs were obtained to evaluate the vascular anatomy.  RADIATION DOSE REDUCTION: This exam was performed according to the departmental dose-optimization program which includes automated exposure control, adjustment of the mA and/or kV according to patient size and/or use of iterative reconstruction technique.  CONTRAST:  118m OMNIPAQUE IOHEXOL 350 MG/ML SOLN  COMPARISON:  Most recent chest films PA Lat 10/25/2022, with prior CTA chest 10/06/2022, prior chest CT no contrast 06/11/2022.  FINDINGS: Cardiovascular: Largest moderately enlarged with left chamber predominance. There is no pericardial effusion. There is prominence of the superior pulmonary veins.  The pulmonary trunk slightly prominent 3.3 cm, unchanged, indicating arterial hypertension but without visible arterial embolism.  There is calcification in the superior mitral ring, scattered calcification LAD coronary artery.  The great vessels and aorta are tortuous. There are mild scattered calcific plaques. There is no aortic or great vessel aneurysm, stenosis or dissection.  Mediastinum/Nodes: Scattered bilateral borderline prominent hilar lymph nodes are unchanged in the interval. Additional right and left paratracheal lymph nodes up to 8 mm in short axis, subcarinal nodes up to 10 mm short axis are unaltered as well.  No new adenopathy. Small hiatal hernia. The thoracic esophagus, trachea and main bronchi are unremarkable. No thyroid mass.  Lungs/Pleura: Diffuse bronchial thickening has increased, especially in the lower lobes. There is patchy increased opacity over portions of both lower lobes consistent with likely bronchopneumonia, specifically in the right lower lobe superior segment and in the posteromedial basal left lower lobe.  Findings are superimposed on chronic interstitial lung disease with subpleural reticulation and ground-glass opacities predominating in the bases and with  honeycombing in both basal lateral areas.  There are bilateral trace pleural effusions. There does not appear to be superimposed interstitial edema at this time.  Upper Abdomen: No ascites or mesenteric congestion. No acute findings. Mild hepatic steatosis. Cholelithiasis without evidence of cholecystitis.  Musculoskeletal: Spray artifact arises from again noted dorsal fusion rod construct C7-T11, with T3-5 laminectomies, central corpectomies and cylinder graft imbedded hardware with mature fusion. Moderate thoracic kyphosis is unchanged.  The ribcage is intact, with multiple healed fracture deformities posteriorly.  There is multilevel thoracic spine bridging enthesopathy, noted from T3 down.  Review of the MIP images confirms the above findings.  IMPRESSION: 1. No evidence of arterial embolus. Slightly prominent pulmonary trunk indicating arterial hypertension. 2. Cardiomegaly with prominence of the superior pulmonary veins but no superimposed interstitial edema. 3. Aortic and coronary artery atherosclerosis. 4. Chronic interstitial lung disease with honeycombing in the lateral bases. 5. Bilateral lower lobe bronchopneumonia with increased bronchial thickening especially in the lower lobes. 6. Trace pleural effusions. 7. Stable borderline prominent mediastinal and hilar lymph nodes. 8. Cholelithiasis without evidence of cholecystitis. 9. Hepatic steatosis. 10. Small hiatal hernia. 11. Postsurgical and degenerative changes of the spine with chronic thoracic kyphosis.  Electronically Signed   By: KTelford NabM.D.   On: 11/05/2022 21:55 [place] Img Lower Unilateral Right CLINICAL DATA:  History of DVT. Recent thrombectomy. Increased groin pain.  EXAM: RIGHT LOWER EXTREMITY VENOUS DOPPLER ULTRASOUND  TECHNIQUE: Gray-scale sonography with compression, as well as color and duplex ultrasound, were performed to  evaluate the deep venous system(s) from the level of  the common femoral vein through the popliteal and proximal calf veins.  COMPARISON:  Right lower extremity venous Doppler ultrasound 10/25/2022  FINDINGS: VENOUS  There is occlusive thrombus within the common femoral vein. There is nonocclusive thrombus within the profunda femoral vein and femoral vein proximally, but normal flow distally.  There is normal flow and compressibility throughout the popliteal vein and saphenofemoral junction.  There is also nonocclusive thrombus within the posterior tibial vein.  The peroneal vein is not visualized.  Limited views of the contralateral common femoral vein were unable to be obtained secondary to patient pain.  OTHER  None.  Limitations: none  IMPRESSION: 1. Occlusive thrombus within the right common femoral vein. 2. Nonocclusive thrombus within the profunda femoral vein and femoral vein proximally. 3. Nonocclusive thrombus within the posterior tibial vein.  Electronically Signed   By: Ronney Asters M.D.   On: 11/05/2022 17:27   Family Communication: I spoke with daughter over the phone.  Disposition: Status is: Inpatient Remains inpatient appropriate because: need for Heparin drip and IV antibiotics.  Planned Discharge Destination: Home    Time spent: >30 minutes  Author: George Hugh, MD 11/06/2022 1:39 PM  For on call review [email or web address].

## 2022-11-06 NOTE — Plan of Care (Signed)
  Problem: Education: Goal: Knowledge of General Education information will improve Description: Including pain rating scale, medication(s)/side effects and non-pharmacologic comfort measures Outcome: Progressing   Problem: Health Behavior/Discharge Planning: Goal: Ability to manage health-related needs will improve Outcome: Progressing   Problem: Clinical Measurements: Goal: Will remain free from infection Outcome: Progressing   Problem: Activity: Goal: Risk for activity intolerance will decrease Outcome: Progressing   Problem: Nutrition: Goal: Adequate nutrition will be maintained Outcome: Progressing   Problem: Safety: Goal: Ability to remain free from injury will improve Outcome: Progressing   Problem: Skin Integrity: Goal: Risk for impaired skin integrity will decrease Outcome: Progressing

## 2022-11-06 NOTE — Consult Note (Signed)
ANTICOAGULATION CONSULT NOTE  Pharmacy Consult for Bivalirudin infusion Indication: DVT  Allergies  Allergen Reactions   Penicillins Swelling   Shrimp [Shellfish Allergy] Hives   Cisatracurium Other (See Comments)    Other reaction(s): Unknown Other reaction(s): Unknown    Heparin Hives and Swelling    Patient's daughter reports patient had swelling/hives during hospital admission in [place]. They were on several medications at once so she is unsure whether it was VANCOMYCIN that caused this reaction or another concomitant medication.    Rocuronium Other (See Comments)    Other reaction(s): Unknown Other reaction(s): Unknown    Vancomycin Hives and Swelling    Patient's daughter reports patient had swelling/hives during hospital admission in [place]. They were on several medications at once so she is unsure whether it was VANCOMYCIN that caused this reaction or another concomitant medication.    Wound Dressings Rash    Tegaderm     Patient Measurements: Height: '5\' 10"'$  (177.8 cm) Weight: 90.7 kg (200 lb) IBW/kg (Calculated) : 73   Vital Signs: Temp: 97.6 F (36.4 C) (12/17 0528) BP: 97/65 (12/17 0604) Pulse Rate: 79 (12/17 0528)  Labs: Recent Labs    11/05/22 1611 11/05/22 1930 11/06/22 0117 11/06/22 0600  HGB 9.7*  --   --  9.4*  HCT 32.3*  --   --  30.5*  PLT 274  --   --  249  APTT 20*  --  61* 58*  LABPROT 14.9  --   --   --   INR 1.2  --   --   --   CREATININE 0.91  --   --   --   TROPONINIHS  --  5  --   --      Estimated Creatinine Clearance: 83.1 mL/min (by C-G formula based on SCr of 0.91 mg/dL).   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  Last reported dose 12/16 @ 0900  Assessment: 71 yo male with a history of recurrent lower extremity DVTs.  Presented to the ED with worsening  swelling and pain in lower extremities.  Patient was recently hospitalized and underwent a thrombectomy.  Had been on Eliquis at home.  Ultrasound confirmed DVTs.  Patient reports allergy to heparin so being started on bivalirudin.  Goal of Therapy:  aPTT 50-85 seconds Monitor platelets by anticoagulation protocol: Yes   12/17 0117 aPTT 61, therapeutic x 1 12/17 0600 aPTT 58, therapeutic x 2  Plan:  -Continue bivalirudin infusion at 0.15 mg/kg/hr (13.5 mg/hr). -Will recheck aPTT in 12 hr to reconfirm then daily -CBC daily  Renda Rolls, PharmD, [facility] 11/06/2022 6:37 AM

## 2022-11-06 NOTE — H&P (View-Only) (Signed)
VASCULAR SURGERY CONSULTATION   Requested by:  Dr. Marjean Donna, MD (ED)  Reason for consultation: RLE DVT    History of Present Illness   Russell [name] is a 71 y.o. (Nov 14, 1951) male who presents with cc: recurrent R leg swelling.  Pt underwent on 09/27/22, and 10/27/22 mechanical thrombectomy interventions on R leg due to extensive thrombosis up to the his prior IVC filter.  Reportedly pt has been taking Eliquis.  Pt notes recurrence of sx last Monday.  Pt denies any SOB or hemoptysis.  Pt notes tightness in Right leg is improved.  He continues to complain of R groin/hip pain with a musculoskeletal character..  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   Hyperlipidemia    Hypertension     Past Surgical History:  Procedure Laterality Date   BACK SURGERY     COLONOSCOPY WITH PROPOFOL N/A 11/05/2019   Procedure: COLONOSCOPY WITH PROPOFOL;  Surgeon: [name], MD;  Location: [facility] ENDOSCOPY;  Service: Gastroenterology;  Laterality: N/A;   PARTIAL COLECTOMY N/A 02/11/2020   Procedure: PARTIAL COLECTOMY -- sigmoid;  Surgeon: Olean Ree, MD;  Location: [facility] ORS;  Service: General;  Laterality: N/A;   PERIPHERAL VASCULAR THROMBECTOMY Right 10/07/2022   Procedure: PERIPHERAL VASCULAR THROMBECTOMY;  Surgeon: Algernon Huxley, MD;  Location: [place] [facility];  Service: Cardiovascular;  Laterality: Right;   PERIPHERAL VASCULAR THROMBECTOMY N/A 10/27/2022   Procedure: PERIPHERAL VASCULAR THROMBECTOMY;  Surgeon: Algernon Huxley, MD;  Location: [place] [facility];  Service: Cardiovascular;  Laterality: N/A;   SPINE SURGERY     TAKE DOWN OF INTESTINAL FISTULA N/A 02/11/2020   Procedure: TAKE DOWN OF INTESTINAL FISTULA -- colovesical fistula;  Surgeon: Olean Ree, MD;  Location: [facility] ORS;  Service: General;  Laterality: N/A;   TRACHEAL SURGERY       Social History   Socioeconomic History   Marital status: Married    Spouse  name: Not on file   Number of children: Not on file   Years of education: Not on file   Highest education level: Not on file  Occupational History   Not on file  Tobacco Use   Smoking status: Never   Smokeless tobacco: Never  Vaping Use   Vaping Use: Never used  Substance and Sexual Activity   Alcohol use: No   Drug use: No   Sexual activity: Yes    Birth control/protection: None  Other Topics Concern   Not on file  Social History Narrative   Not on file   Social Determinants of Health   Financial Resource Strain: Not on file  Food Insecurity: No Food Insecurity (11/05/2022)   Hunger Vital Sign    Worried About Running Out of Food in the Last Year: Never true    Ran Out of Food in the Last Year: Never true  Transportation Needs: No Transportation Needs (11/05/2022)   PRAPARE - Hydrologist (Medical): No    Lack of Transportation (Non-Medical): No  Physical Activity: Not on file  Stress: Not on file  Social Connections: Not on file  Intimate Partner Violence: Not At Risk (11/05/2022)   Humiliation, Afraid, Rape, and Kick questionnaire    Fear of Current or Ex-Partner: No    Emotionally Abused: No    Physically Abused: No    Sexually Abused: No    Family History  Problem Relation Age  of Onset   Prostate cancer Neg Hx    Bladder Cancer Neg Hx    Kidney cancer Neg Hx     Current Facility-Administered Medications  Medication Dose Route Frequency Provider Last Rate Last Admin   acetaminophen (TYLENOL) tablet 650 mg  650 mg Oral Q6H PRN Clarnce Flock, MD       Or   acetaminophen (TYLENOL) suppository 650 mg  650 mg Rectal Q6H PRN Clarnce Flock, MD       bivalirudin (ANGIOMAX) 250 mg in sodium chloride 0.9 % 500 mL (0.5 mg/mL) infusion  0.15 mg/kg/hr Intravenous Continuous Clarnce Flock, MD 27.2 mL/hr at 11/05/22 2128 0.15 mg/kg/hr at 11/05/22 2128   cefTRIAXone (ROCEPHIN) 2 g in sodium chloride 0.9 % 100 mL IVPB  2 g  Intravenous Q24H George Hugh, MD       doxycycline (VIBRA-TABS) tablet 100 mg  100 mg Oral Daily Clarnce Flock, MD   100 mg at 11/05/22 2144   gabapentin (NEURONTIN) capsule 300 mg  300 mg Oral TID Clarnce Flock, MD   300 mg at 11/05/22 2227   HYDROmorphone (DILAUDID) injection 0.5-1 mg  0.5-1 mg Intravenous Q2H PRN Clarnce Flock, MD       ondansetron [facility]) tablet 4 mg  4 mg Oral Q6H PRN Clarnce Flock, MD       Or   ondansetron [facility]) injection 4 mg  4 mg Intravenous Q6H PRN Clarnce Flock, MD       oxyCODONE (Oxy IR/ROXICODONE) immediate release tablet 5 mg  5 mg Oral Q4H PRN Clarnce Flock, MD       polyethylene glycol (MIRALAX / GLYCOLAX) packet 17 g  17 g Oral Daily PRN Clarnce Flock, MD       sodium chloride flush (NS) 0.9 % injection 3 mL  3 mL Intravenous Q12H Clarnce Flock, MD   3 mL at 11/05/22 2227   traZODone (DESYREL) tablet 50 mg  50 mg Oral QHS PRN Clarnce Flock, MD        Allergies  Allergen Reactions   Penicillins Swelling   Shrimp [Shellfish Allergy] Hives   Cisatracurium Other (See Comments)    Other reaction(s): Unknown Other reaction(s): Unknown    Heparin Hives and Swelling    Patient's daughter reports patient had swelling/hives during hospital admission in [place]. They were on several medications at once so she is unsure whether it was VANCOMYCIN that caused this reaction or another concomitant medication.    Rocuronium Other (See Comments)    Other reaction(s): Unknown Other reaction(s): Unknown    Vancomycin Hives and Swelling    Patient's daughter reports patient had swelling/hives during hospital admission in [place]. They were on several medications at once so she is unsure whether it was VANCOMYCIN that caused this reaction or another concomitant medication.    Wound Dressings Rash    Tegaderm     REVIEW OF SYSTEMS (negative unless checked):   Cardiac:  '[]'$  Chest pain or chest pressure? '[]'$  Shortness of  breath upon activity? '[]'$  Shortness of breath when lying flat? '[]'$  Irregular heart rhythm?  Vascular:  '[x]'$  Pain in calf, thigh, or hip brought on by walking? '[]'$  Pain in feet at night that wakes you up from your sleep? '[x]'$  Blood clot in your veins? '[x]'$  Leg swelling?  Pulmonary:  '[]'$  Oxygen at home? '[]'$  Productive cough? '[]'$  Wheezing?  Neurologic:  '[]'$  Sudden weakness in arms or legs? '[]'$  Sudden numbness in  arms or legs? '[]'$  Sudden onset of difficult speaking or slurred speech? '[]'$  Temporary loss of vision in one eye? '[]'$  Problems with dizziness?  Gastrointestinal:  '[]'$  Blood in stool? '[]'$  Vomited blood?  Genitourinary:  '[]'$  Burning when urinating? '[]'$  Blood in urine?  Psychiatric:  '[]'$  Major depression  Hematologic:  '[]'$  Bleeding problems? '[]'$  Problems with blood clotting?  Dermatologic:  '[]'$  Rashes or ulcers?  Constitutional:  '[]'$  Fever or chills?  Ear/Nose/Throat:  '[]'$  Change in hearing? '[]'$  Nose bleeds? '[]'$  Sore throat?  Musculoskeletal:  '[]'$  Back pain? '[]'$  Joint pain? '[]'$  Muscle pain?   Physical Examination     Vitals:   11/05/22 2131 11/05/22 2202 11/06/22 0528 11/06/22 0604  BP: 115/80 122/84 (!) 86/58 97/65  Pulse: 80 82 79   Resp: '18 17 16   '$ Temp:  97.7 F (36.5 C) 97.6 F (36.4 C)   TempSrc:      SpO2: 95% 97% 95%   Weight:      Height:       Body mass index is 28.7 kg/m.  General Alert, O x 3, WD, NAD  Pulmonary Sym exp, good B air movt, CTA B  Cardiac RRR, Nl S1, S2, no Murmurs, No rubs, No S3,S4  Vascular Vessel Right Left  Radial Palpable Palpable  Brachial Palpable Palpable  Carotid Palpable, No Bruit Palpable, No Bruit  Aorta Not palpable N/A  Femoral Palpable Palpable  Popliteal Not palpable Not palpable  PT Not palpable Not palpable  DP Faintly palpable Faintly palpable    Musculo- skeletal M/S 5/5 throughout  , Extremities without ischemic changes  , R leg edema 1-2+, LLE  Neurologic CN grossly intact, Pain and light touch intact in  extremities, Motor exam as listed above    Non-invasive Vascular Imaging     RLE venous duplex (10/06/22) 1. Occlusive thrombus within the right common femoral vein. 2. Nonocclusive thrombus within the profunda femoral vein and femoral vein proximally. 3. Nonocclusive thrombus within the posterior tibial vein.     Laboratory   CBC    Latest Ref Rng & Units 11/06/2022    6:00 AM 11/05/2022    4:11 PM 10/31/2022    6:37 AM  CBC  WBC 4.0 - 10.5 K/uL 3.6  5.6  4.9   Hemoglobin 13.0 - 17.0 g/dL 9.4  9.7  8.4   Hematocrit 39.0 - 52.0 % 30.5  32.3  27.4   Platelets 150 - 400 K/uL 249  274  167     BMP    Latest Ref Rng & Units 11/05/2022    4:11 PM 10/31/2022   12:58 PM 10/31/2022    6:37 AM  BMP  Glucose 70 - 99 mg/dL 109   92   BUN 8 - 23 mg/dL 15   19   Creatinine 0.61 - 1.24 mg/dL 0.91   0.87   Sodium 135 - 145 mmol/L 139   145   Potassium 3.5 - 5.1 mmol/L 4.5  3.4  3.3   Chloride 98 - 111 mmol/L 109   116   CO2 22 - 32 mmol/L 22   25   Calcium 8.9 - 10.3 mg/dL 8.9   8.2     Coagulation Lab Results  Component Value Date   INR 1.2 11/05/2022   INR 1.5 (H) 10/25/2022   INR 1.2 10/06/2022   No results found for: "PTT"  Lipids    Component Value Date/Time   CHOL 121 04/07/2022 0418   TRIG 53 04/07/2022 0418  HDL 32 (L) 04/07/2022 0418   CHOLHDL 3.8 04/07/2022 0418   VLDL 11 04/07/2022 0418   LDLCALC 78 04/07/2022 0418     Radiology     CT Angio Chest PE W and/or Wo Contrast  Result Date: 11/05/2022 CLINICAL DATA:  Right lower extremity swelling. Pulmonary embolism suspected. High probability. History of thoracic spine fusion surgery. EXAM: CT ANGIOGRAPHY CHEST WITH CONTRAST TECHNIQUE: Multidetector CT imaging of the chest was performed using the standard protocol during bolus administration of intravenous contrast. Multiplanar CT image reconstructions and MIPs were obtained to evaluate the vascular anatomy. RADIATION DOSE REDUCTION: This exam was  performed according to the departmental dose-optimization program which includes automated exposure control, adjustment of the mA and/or kV according to patient size and/or use of iterative reconstruction technique. CONTRAST:  168m OMNIPAQUE IOHEXOL 350 MG/ML SOLN COMPARISON:  Most recent chest films PA Lat 10/25/2022, with prior CTA chest 10/06/2022, prior chest CT no contrast 06/11/2022. FINDINGS: Cardiovascular: Largest moderately enlarged with left chamber predominance. There is no pericardial effusion. There is prominence of the superior pulmonary veins. The pulmonary trunk slightly prominent 3.3 cm, unchanged, indicating arterial hypertension but without visible arterial embolism. There is calcification in the superior mitral ring, scattered calcification LAD coronary artery. The great vessels and aorta are tortuous. There are mild scattered calcific plaques. There is no aortic or great vessel aneurysm, stenosis or dissection. Mediastinum/Nodes: Scattered bilateral borderline prominent hilar lymph nodes are unchanged in the interval. Additional right and left paratracheal lymph nodes up to 8 mm in short axis, subcarinal nodes up to 10 mm short axis are unaltered as well. No new adenopathy. Small hiatal hernia. The thoracic esophagus, trachea and main bronchi are unremarkable. No thyroid mass. Lungs/Pleura: Diffuse bronchial thickening has increased, especially in the lower lobes. There is patchy increased opacity over portions of both lower lobes consistent with likely bronchopneumonia, specifically in the right lower lobe superior segment and in the posteromedial basal left lower lobe. Findings are superimposed on chronic interstitial lung disease with subpleural reticulation and ground-glass opacities predominating in the bases and with honeycombing in both basal lateral areas. There are bilateral trace pleural effusions. There does not appear to be superimposed interstitial edema at this time. Upper  Abdomen: No ascites or mesenteric congestion. No acute findings. Mild hepatic steatosis. Cholelithiasis without evidence of cholecystitis. Musculoskeletal: Spray artifact arises from again noted dorsal fusion rod construct C7-T11, with T3-5 laminectomies, central corpectomies and cylinder graft imbedded hardware with mature fusion. Moderate thoracic kyphosis is unchanged. The ribcage is intact, with multiple healed fracture deformities posteriorly. There is multilevel thoracic spine bridging enthesopathy, noted from T3 down. Review of the MIP images confirms the above findings. IMPRESSION: 1. No evidence of arterial embolus. Slightly prominent pulmonary trunk indicating arterial hypertension. 2. Cardiomegaly with prominence of the superior pulmonary veins but no superimposed interstitial edema. 3. Aortic and coronary artery atherosclerosis. 4. Chronic interstitial lung disease with honeycombing in the lateral bases. 5. Bilateral lower lobe bronchopneumonia with increased bronchial thickening especially in the lower lobes. 6. Trace pleural effusions. 7. Stable borderline prominent mediastinal and hilar lymph nodes. 8. Cholelithiasis without evidence of cholecystitis. 9. Hepatic steatosis. 10. Small hiatal hernia. 11. Postsurgical and degenerative changes of the spine with chronic thoracic kyphosis. Electronically Signed   By: KTelford NabM.D.   On: 11/05/2022 21:55   [place]ous Img Lower Unilateral Right  Result Date: 11/05/2022 CLINICAL DATA:  History of DVT. Recent thrombectomy. Increased groin pain. EXAM: RIGHT LOWER EXTREMITY VENOUS DOPPLER  ULTRASOUND TECHNIQUE: Gray-scale sonography with compression, as well as color and duplex ultrasound, were performed to evaluate the deep venous system(s) from the level of the common femoral vein through the popliteal and proximal calf veins. COMPARISON:  Right lower extremity venous Doppler ultrasound 10/25/2022 FINDINGS: VENOUS There is occlusive thrombus within the  common femoral vein. There is nonocclusive thrombus within the profunda femoral vein and femoral vein proximally, but normal flow distally. There is normal flow and compressibility throughout the popliteal vein and saphenofemoral junction. There is also nonocclusive thrombus within the posterior tibial vein. The peroneal vein is not visualized. Limited views of the contralateral common femoral vein were unable to be obtained secondary to patient pain. OTHER None. Limitations: none IMPRESSION: 1. Occlusive thrombus within the right common femoral vein. 2. Nonocclusive thrombus within the profunda femoral vein and femoral vein proximally. 3. Nonocclusive thrombus within the posterior tibial vein. Electronically Signed   By: Ronney Asters M.D.   On: 11/05/2022 17:27     Medical Decision Making   Boby Eyer is a 71 y.o. 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  This time on Eliquis. Again, unable to determine if compliance is an issue, but if pt is taking Eliquis correctly, probably have to switch agents, either Xarelto or Coumadin Other considerations: Pt has known IVC filter. All IVC filters are PROTHROMBOTIC. Don't know if pt is a candidate for IVC filter removal or if filter is a permanent model.  Pt is not aware of the details. Ok to keep on Angiomax for now Keep pt NPO after midnight in case Dr. Lucky Cowboy elects to perform a third intervention on his RIGHT leg with 2 months   Adele Barthel, MD, FACS, FSVS Covering for [facility]: [phone]  11/06/2022, 8:55 AM

## 2022-11-06 NOTE — Consult Note (Addendum)
VASCULAR SURGERY CONSULTATION   Requested by:  Dr. Marjean Donna, MD (ED)  Reason for consultation: RLE DVT    History of Present Illness   Russell [name] is a 71 y.o. (1951/05/01) male who presents with cc: recurrent R leg swelling.  Pt underwent on 09/27/22, and 10/27/22 mechanical thrombectomy interventions on R leg due to extensive thrombosis up to the his prior IVC filter.  Reportedly pt has been taking Eliquis.  Pt notes recurrence of sx last Monday.  Pt denies any SOB or hemoptysis.  Pt notes tightness in Right leg is improved.  He continues to complain of R groin/hip pain with a musculoskeletal character..  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   Hyperlipidemia    Hypertension     Past Surgical History:  Procedure Laterality Date   BACK SURGERY     COLONOSCOPY WITH PROPOFOL N/A 11/05/2019   Procedure: COLONOSCOPY WITH PROPOFOL;  Surgeon: [name], MD;  Location: [facility] ENDOSCOPY;  Service: Gastroenterology;  Laterality: N/A;   PARTIAL COLECTOMY N/A 02/11/2020   Procedure: PARTIAL COLECTOMY -- sigmoid;  Surgeon: Olean Ree, MD;  Location: [facility] ORS;  Service: General;  Laterality: N/A;   PERIPHERAL VASCULAR THROMBECTOMY Right 10/07/2022   Procedure: PERIPHERAL VASCULAR THROMBECTOMY;  Surgeon: Algernon Huxley, MD;  Location: [place] [facility];  Service: Cardiovascular;  Laterality: Right;   PERIPHERAL VASCULAR THROMBECTOMY N/A 10/27/2022   Procedure: PERIPHERAL VASCULAR THROMBECTOMY;  Surgeon: Algernon Huxley, MD;  Location: [place] [facility];  Service: Cardiovascular;  Laterality: N/A;   SPINE SURGERY     TAKE DOWN OF INTESTINAL FISTULA N/A 02/11/2020   Procedure: TAKE DOWN OF INTESTINAL FISTULA -- colovesical fistula;  Surgeon: Olean Ree, MD;  Location: [facility] ORS;  Service: General;  Laterality: N/A;   TRACHEAL SURGERY       Social History   Socioeconomic History   Marital status: Married    Spouse  name: Not on file   Number of children: Not on file   Years of education: Not on file   Highest education level: Not on file  Occupational History   Not on file  Tobacco Use   Smoking status: Never   Smokeless tobacco: Never  Vaping Use   Vaping Use: Never used  Substance and Sexual Activity   Alcohol use: No   Drug use: No   Sexual activity: Yes    Birth control/protection: None  Other Topics Concern   Not on file  Social History Narrative   Not on file   Social Determinants of Health   Financial Resource Strain: Not on file  Food Insecurity: No Food Insecurity (11/05/2022)   Hunger Vital Sign    Worried About Running Out of Food in the Last Year: Never true    Ran Out of Food in the Last Year: Never true  Transportation Needs: No Transportation Needs (11/05/2022)   PRAPARE - Hydrologist (Medical): No    Lack of Transportation (Non-Medical): No  Physical Activity: Not on file  Stress: Not on file  Social Connections: Not on file  Intimate Partner Violence: Not At Risk (11/05/2022)   Humiliation, Afraid, Rape, and Kick questionnaire    Fear of Current or Ex-Partner: No    Emotionally Abused: No    Physically Abused: No    Sexually Abused: No    Family History  Problem Relation Age  of Onset   Prostate cancer Neg Hx    Bladder Cancer Neg Hx    Kidney cancer Neg Hx     Current Facility-Administered Medications  Medication Dose Route Frequency Provider Last Rate Last Admin   acetaminophen (TYLENOL) tablet 650 mg  650 mg Oral Q6H PRN Clarnce Flock, MD       Or   acetaminophen (TYLENOL) suppository 650 mg  650 mg Rectal Q6H PRN Clarnce Flock, MD       bivalirudin (ANGIOMAX) 250 mg in sodium chloride 0.9 % 500 mL (0.5 mg/mL) infusion  0.15 mg/kg/hr Intravenous Continuous Clarnce Flock, MD 27.2 mL/hr at 11/05/22 2128 0.15 mg/kg/hr at 11/05/22 2128   cefTRIAXone (ROCEPHIN) 2 g in sodium chloride 0.9 % 100 mL IVPB  2 g  Intravenous Q24H George Hugh, MD       doxycycline (VIBRA-TABS) tablet 100 mg  100 mg Oral Daily Clarnce Flock, MD   100 mg at 11/05/22 2144   gabapentin (NEURONTIN) capsule 300 mg  300 mg Oral TID Clarnce Flock, MD   300 mg at 11/05/22 2227   HYDROmorphone (DILAUDID) injection 0.5-1 mg  0.5-1 mg Intravenous Q2H PRN Clarnce Flock, MD       ondansetron [facility]) tablet 4 mg  4 mg Oral Q6H PRN Clarnce Flock, MD       Or   ondansetron [facility]) injection 4 mg  4 mg Intravenous Q6H PRN Clarnce Flock, MD       oxyCODONE (Oxy IR/ROXICODONE) immediate release tablet 5 mg  5 mg Oral Q4H PRN Clarnce Flock, MD       polyethylene glycol (MIRALAX / GLYCOLAX) packet 17 g  17 g Oral Daily PRN Clarnce Flock, MD       sodium chloride flush (NS) 0.9 % injection 3 mL  3 mL Intravenous Q12H Clarnce Flock, MD   3 mL at 11/05/22 2227   traZODone (DESYREL) tablet 50 mg  50 mg Oral QHS PRN Clarnce Flock, MD        Allergies  Allergen Reactions   Penicillins Swelling   Shrimp [Shellfish Allergy] Hives   Cisatracurium Other (See Comments)    Other reaction(s): Unknown Other reaction(s): Unknown    Heparin Hives and Swelling    Patient's daughter reports patient had swelling/hives during hospital admission in [place]. They were on several medications at once so she is unsure whether it was VANCOMYCIN that caused this reaction or another concomitant medication.    Rocuronium Other (See Comments)    Other reaction(s): Unknown Other reaction(s): Unknown    Vancomycin Hives and Swelling    Patient's daughter reports patient had swelling/hives during hospital admission in [place]. They were on several medications at once so she is unsure whether it was VANCOMYCIN that caused this reaction or another concomitant medication.    Wound Dressings Rash    Tegaderm     REVIEW OF SYSTEMS (negative unless checked):   Cardiac:  '[]'$  Chest pain or chest pressure? '[]'$  Shortness of  breath upon activity? '[]'$  Shortness of breath when lying flat? '[]'$  Irregular heart rhythm?  Vascular:  '[x]'$  Pain in calf, thigh, or hip brought on by walking? '[]'$  Pain in feet at night that wakes you up from your sleep? '[x]'$  Blood clot in your veins? '[x]'$  Leg swelling?  Pulmonary:  '[]'$  Oxygen at home? '[]'$  Productive cough? '[]'$  Wheezing?  Neurologic:  '[]'$  Sudden weakness in arms or legs? '[]'$  Sudden numbness in  arms or legs? '[]'$  Sudden onset of difficult speaking or slurred speech? '[]'$  Temporary loss of vision in one eye? '[]'$  Problems with dizziness?  Gastrointestinal:  '[]'$  Blood in stool? '[]'$  Vomited blood?  Genitourinary:  '[]'$  Burning when urinating? '[]'$  Blood in urine?  Psychiatric:  '[]'$  Major depression  Hematologic:  '[]'$  Bleeding problems? '[]'$  Problems with blood clotting?  Dermatologic:  '[]'$  Rashes or ulcers?  Constitutional:  '[]'$  Fever or chills?  Ear/Nose/Throat:  '[]'$  Change in hearing? '[]'$  Nose bleeds? '[]'$  Sore throat?  Musculoskeletal:  '[]'$  Back pain? '[]'$  Joint pain? '[]'$  Muscle pain?   Physical Examination     Vitals:   11/05/22 2131 11/05/22 2202 11/06/22 0528 11/06/22 0604  BP: 115/80 122/84 (!) 86/58 97/65  Pulse: 80 82 79   Resp: '18 17 16   '$ Temp:  97.7 F (36.5 C) 97.6 F (36.4 C)   TempSrc:      SpO2: 95% 97% 95%   Weight:      Height:       Body mass index is 28.7 kg/m.  General Alert, O x 3, WD, NAD  Pulmonary Sym exp, good B air movt, CTA B  Cardiac RRR, Nl S1, S2, no Murmurs, No rubs, No S3,S4  Vascular Vessel Right Left  Radial Palpable Palpable  Brachial Palpable Palpable  Carotid Palpable, No Bruit Palpable, No Bruit  Aorta Not palpable N/A  Femoral Palpable Palpable  Popliteal Not palpable Not palpable  PT Not palpable Not palpable  DP Faintly palpable Faintly palpable    Musculo- skeletal M/S 5/5 throughout  , Extremities without ischemic changes  , R leg edema 1-2+, LLE  Neurologic CN grossly intact, Pain and light touch intact in  extremities, Motor exam as listed above    Non-invasive Vascular Imaging     RLE venous duplex (10/06/22) 1. Occlusive thrombus within the right common femoral vein. 2. Nonocclusive thrombus within the profunda femoral vein and femoral vein proximally. 3. Nonocclusive thrombus within the posterior tibial vein.     Laboratory   CBC    Latest Ref Rng & Units 11/06/2022    6:00 AM 11/05/2022    4:11 PM 10/31/2022    6:37 AM  CBC  WBC 4.0 - 10.5 K/uL 3.6  5.6  4.9   Hemoglobin 13.0 - 17.0 g/dL 9.4  9.7  8.4   Hematocrit 39.0 - 52.0 % 30.5  32.3  27.4   Platelets 150 - 400 K/uL 249  274  167     BMP    Latest Ref Rng & Units 11/05/2022    4:11 PM 10/31/2022   12:58 PM 10/31/2022    6:37 AM  BMP  Glucose 70 - 99 mg/dL 109   92   BUN 8 - 23 mg/dL 15   19   Creatinine 0.61 - 1.24 mg/dL 0.91   0.87   Sodium 135 - 145 mmol/L 139   145   Potassium 3.5 - 5.1 mmol/L 4.5  3.4  3.3   Chloride 98 - 111 mmol/L 109   116   CO2 22 - 32 mmol/L 22   25   Calcium 8.9 - 10.3 mg/dL 8.9   8.2     Coagulation Lab Results  Component Value Date   INR 1.2 11/05/2022   INR 1.5 (H) 10/25/2022   INR 1.2 10/06/2022   No results found for: "PTT"  Lipids    Component Value Date/Time   CHOL 121 04/07/2022 0418   TRIG 53 04/07/2022 0418  HDL 32 (L) 04/07/2022 0418   CHOLHDL 3.8 04/07/2022 0418   VLDL 11 04/07/2022 0418   LDLCALC 78 04/07/2022 0418     Radiology     CT Angio Chest PE W and/or Wo Contrast  Result Date: 11/05/2022 CLINICAL DATA:  Right lower extremity swelling. Pulmonary embolism suspected. High probability. History of thoracic spine fusion surgery. EXAM: CT ANGIOGRAPHY CHEST WITH CONTRAST TECHNIQUE: Multidetector CT imaging of the chest was performed using the standard protocol during bolus administration of intravenous contrast. Multiplanar CT image reconstructions and MIPs were obtained to evaluate the vascular anatomy. RADIATION DOSE REDUCTION: This exam was  performed according to the departmental dose-optimization program which includes automated exposure control, adjustment of the mA and/or kV according to patient size and/or use of iterative reconstruction technique. CONTRAST:  1105m OMNIPAQUE IOHEXOL 350 MG/ML SOLN COMPARISON:  Most recent chest films PA Lat 10/25/2022, with prior CTA chest 10/06/2022, prior chest CT no contrast 06/11/2022. FINDINGS: Cardiovascular: Largest moderately enlarged with left chamber predominance. There is no pericardial effusion. There is prominence of the superior pulmonary veins. The pulmonary trunk slightly prominent 3.3 cm, unchanged, indicating arterial hypertension but without visible arterial embolism. There is calcification in the superior mitral ring, scattered calcification LAD coronary artery. The great vessels and aorta are tortuous. There are mild scattered calcific plaques. There is no aortic or great vessel aneurysm, stenosis or dissection. Mediastinum/Nodes: Scattered bilateral borderline prominent hilar lymph nodes are unchanged in the interval. Additional right and left paratracheal lymph nodes up to 8 mm in short axis, subcarinal nodes up to 10 mm short axis are unaltered as well. No new adenopathy. Small hiatal hernia. The thoracic esophagus, trachea and main bronchi are unremarkable. No thyroid mass. Lungs/Pleura: Diffuse bronchial thickening has increased, especially in the lower lobes. There is patchy increased opacity over portions of both lower lobes consistent with likely bronchopneumonia, specifically in the right lower lobe superior segment and in the posteromedial basal left lower lobe. Findings are superimposed on chronic interstitial lung disease with subpleural reticulation and ground-glass opacities predominating in the bases and with honeycombing in both basal lateral areas. There are bilateral trace pleural effusions. There does not appear to be superimposed interstitial edema at this time. Upper  Abdomen: No ascites or mesenteric congestion. No acute findings. Mild hepatic steatosis. Cholelithiasis without evidence of cholecystitis. Musculoskeletal: Spray artifact arises from again noted dorsal fusion rod construct C7-T11, with T3-5 laminectomies, central corpectomies and cylinder graft imbedded hardware with mature fusion. Moderate thoracic kyphosis is unchanged. The ribcage is intact, with multiple healed fracture deformities posteriorly. There is multilevel thoracic spine bridging enthesopathy, noted from T3 down. Review of the MIP images confirms the above findings. IMPRESSION: 1. No evidence of arterial embolus. Slightly prominent pulmonary trunk indicating arterial hypertension. 2. Cardiomegaly with prominence of the superior pulmonary veins but no superimposed interstitial edema. 3. Aortic and coronary artery atherosclerosis. 4. Chronic interstitial lung disease with honeycombing in the lateral bases. 5. Bilateral lower lobe bronchopneumonia with increased bronchial thickening especially in the lower lobes. 6. Trace pleural effusions. 7. Stable borderline prominent mediastinal and hilar lymph nodes. 8. Cholelithiasis without evidence of cholecystitis. 9. Hepatic steatosis. 10. Small hiatal hernia. 11. Postsurgical and degenerative changes of the spine with chronic thoracic kyphosis. Electronically Signed   By: KTelford NabM.D.   On: 11/05/2022 21:55   UKoreaVenous Img Lower Unilateral Right  Result Date: 11/05/2022 CLINICAL DATA:  History of DVT. Recent thrombectomy. Increased groin pain. EXAM: RIGHT LOWER EXTREMITY VENOUS DOPPLER  ULTRASOUND TECHNIQUE: Gray-scale sonography with compression, as well as color and duplex ultrasound, were performed to evaluate the deep venous system(s) from the level of the common femoral vein through the popliteal and proximal calf veins. COMPARISON:  Right lower extremity venous Doppler ultrasound 10/25/2022 FINDINGS: VENOUS There is occlusive thrombus within the  common femoral vein. There is nonocclusive thrombus within the profunda femoral vein and femoral vein proximally, but normal flow distally. There is normal flow and compressibility throughout the popliteal vein and saphenofemoral junction. There is also nonocclusive thrombus within the posterior tibial vein. The peroneal vein is not visualized. Limited views of the contralateral common femoral vein were unable to be obtained secondary to patient pain. OTHER None. Limitations: none IMPRESSION: 1. Occlusive thrombus within the right common femoral vein. 2. Nonocclusive thrombus within the profunda femoral vein and femoral vein proximally. 3. Nonocclusive thrombus within the posterior tibial vein. Electronically Signed   By: Ronney Asters M.D.   On: 11/05/2022 17:27     Medical Decision Making   Russell [name] is a 71 y.o. 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  This time on Eliquis. Again, unable to determine if compliance is an issue, but if pt is taking Eliquis correctly, probably have to switch agents, either Xarelto or Coumadin Other considerations: Pt has known IVC filter. All IVC filters are PROTHROMBOTIC. Don't know if pt is a candidate for IVC filter removal or if filter is a permanent model.  Pt is not aware of the details. Ok to keep on Angiomax for now Keep pt NPO after midnight in case Dr. Lucky Cowboy elects to perform a third intervention on his RIGHT leg with 2 months   Adele Barthel, MD, FACS, FSVS Covering for [facility]: [phone]  11/06/2022, 8:55 AM

## 2022-11-06 NOTE — Consult Note (Signed)
ANTICOAGULATION CONSULT NOTE  Pharmacy Consult for Bivalirudin infusion Indication: DVT  Allergies  Allergen Reactions   Penicillins Swelling   Shrimp [Shellfish Allergy] Hives   Cephalosporins Hives and Itching   Cisatracurium Other (See Comments)    Other reaction(s): Unknown Other reaction(s): Unknown    Heparin Hives and Swelling    Patient's daughter reports patient had swelling/hives during hospital admission in [place]. They were on several medications at once so she is unsure whether it was VANCOMYCIN that caused this reaction or another concomitant medication.    Rocuronium Other (See Comments)    Other reaction(s): Unknown Other reaction(s): Unknown    Vancomycin Hives and Swelling    Patient's daughter reports patient had swelling/hives during hospital admission in [place]. They were on several medications at once so she is unsure whether it was VANCOMYCIN that caused this reaction or another concomitant medication.    Wound Dressings Rash    Tegaderm     Patient Measurements: Height: '5\' 10"'$  (177.8 cm) Weight: 90.7 kg (200 lb) IBW/kg (Calculated) : 73   Vital Signs: Temp: 97.7 F (36.5 C) (12/17 1747) BP: 116/96 (12/17 1747) Pulse Rate: 93 (12/17 1747)  Labs: Recent Labs    11/05/22 1611 11/05/22 1930 11/06/22 0117 11/06/22 0600 11/06/22 1830  HGB 9.7*  --   --  9.4*  --   HCT 32.3*  --   --  30.5*  --   PLT 274  --   --  249  --   APTT 20*  --  61* 58* 52*  LABPROT 14.9  --   --   --   --   INR 1.2  --   --   --   --   CREATININE 0.91  --   --   --   --   TROPONINIHS  --  5  --   --   --      Estimated Creatinine Clearance: 83.1 mL/min (by C-G formula based on SCr of 0.91 mg/dL).   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  Last reported dose 12/16 @ 0900  Assessment: 71 yo  male with a history of recurrent lower extremity DVTs.  Presented to the ED with worsening swelling and pain in lower extremities.  Patient was recently hospitalized and underwent a thrombectomy.  Had been on Eliquis at home.  Ultrasound confirmed DVTs.  Patient reports allergy to heparin so being started on bivalirudin.  Goal of Therapy:  aPTT 50-85 seconds Monitor platelets by anticoagulation protocol: Yes   12/17 0117 aPTT 61, therapeutic x 1 12/17 0600 aPTT 58, therapeutic x 2 12/17 1830 aPTT 52, therapeutic x 3  Plan:  -Continue bivalirudin infusion at 0.15 mg/kg/hr (13.5 mg/hr). -Will recheck aPTT 12/18 with am labs -CBC daily  Lorin Picket, PharmD 11/06/2022 7:07 PM

## 2022-11-06 NOTE — Consult Note (Signed)
ANTICOAGULATION CONSULT NOTE  Pharmacy Consult for Bivalirudin infusion Indication: DVT  Allergies  Allergen Reactions   Penicillins Swelling   Shrimp [Shellfish Allergy] Hives   Cisatracurium Other (See Comments)    Other reaction(s): Unknown Other reaction(s): Unknown    Heparin Hives and Swelling    Patient's daughter reports patient had swelling/hives during hospital admission in [place]. They were on several medications at once so she is unsure whether it was VANCOMYCIN that caused this reaction or another concomitant medication.    Rocuronium Other (See Comments)    Other reaction(s): Unknown Other reaction(s): Unknown    Vancomycin Hives and Swelling    Patient's daughter reports patient had swelling/hives during hospital admission in [place]. They were on several medications at once so she is unsure whether it was VANCOMYCIN that caused this reaction or another concomitant medication.    Wound Dressings Rash    Tegaderm     Patient Measurements: Height: '5\' 10"'$  (177.8 cm) Weight: 90.7 kg (200 lb) IBW/kg (Calculated) : 73   Vital Signs: Temp: 97.7 F (36.5 C) (12/16 2202) Temp Source: Oral (12/16 1835) BP: 122/84 (12/16 2202) Pulse Rate: 82 (12/16 2202)  Labs: Recent Labs    11/05/22 1611 11/05/22 1930 11/06/22 0117  HGB 9.7*  --   --   HCT 32.3*  --   --   PLT 274  --   --   APTT 20*  --  61*  LABPROT 14.9  --   --   INR 1.2  --   --   CREATININE 0.91  --   --   TROPONINIHS  --  5  --      Estimated Creatinine Clearance: 83.1 mL/min (by C-G formula based on SCr of 0.91 mg/dL).   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  Last reported dose 12/16 @ 0900  Assessment: 71 yo male with a history of recurrent lower extremity DVTs.  Presented to the ED with worsening swelling and pain in  lower extremities.  Patient was recently hospitalized and underwent a thrombectomy.  Had been on Eliquis at home.  Ultrasound confirmed DVTs.  Patient reports allergy to heparin so being started on bivalirudin.  Goal of Therapy:  aPTT 50-85 seconds Monitor platelets by anticoagulation protocol: Yes   12/17 0117 aPTT 61, therapeutic x 1  Plan:  -Continue bivalirudin infusion at 0.15 mg/kg/hr (13.5 mg/hr). -Recheck aPTT in w/ AM labs to confirm  Renda Rolls, PharmD, [facility] 11/06/2022 1:55 AM

## 2022-11-07 ENCOUNTER — Encounter: Payer: Self-pay | Admitting: Vascular Surgery

## 2022-11-07 ENCOUNTER — Encounter
Admission: EM | Disposition: A | Discharge status: Home / Self Care | Payer: Self-pay | Source: Home / Self Care | Attending: Hospitalist

## 2022-11-07 DIAGNOSIS — T82868A Thrombosis of vascular prosthetic devices, implants and grafts, initial encounter: Secondary | ICD-10-CM

## 2022-11-07 DIAGNOSIS — D649 Anemia, unspecified: Secondary | ICD-10-CM

## 2022-11-07 DIAGNOSIS — I82220 Acute embolism and thrombosis of inferior vena cava: Secondary | ICD-10-CM

## 2022-11-07 DIAGNOSIS — I82401 Acute embolism and thrombosis of unspecified deep veins of right lower extremity: Secondary | ICD-10-CM | POA: Diagnosis not present

## 2022-11-07 DIAGNOSIS — J441 Chronic obstructive pulmonary disease with (acute) exacerbation: Secondary | ICD-10-CM | POA: Diagnosis not present

## 2022-11-07 DIAGNOSIS — J189 Pneumonia, unspecified organism: Secondary | ICD-10-CM

## 2022-11-07 DIAGNOSIS — I82421 Acute embolism and thrombosis of right iliac vein: Secondary | ICD-10-CM

## 2022-11-07 DIAGNOSIS — I82411 Acute embolism and thrombosis of right femoral vein: Secondary | ICD-10-CM

## 2022-11-07 HISTORY — PX: PERIPHERAL VASCULAR THROMBECTOMY: CATH118306

## 2022-11-07 LAB — BASIC METABOLIC PANEL
Anion gap: 7 (ref 5–15)
BUN: 19 mg/dL (ref 8–23)
CO2: 22 mmol/L (ref 22–32)
Calcium: 8.9 mg/dL (ref 8.9–10.3)
Chloride: 113 mmol/L — ABNORMAL HIGH (ref 98–111)
Creatinine, Ser: 0.98 mg/dL (ref 0.61–1.24)
GFR, Estimated: >60 mL/min (ref >60)
Glucose, Bld: 113 mg/dL — ABNORMAL HIGH (ref 70–99)
Potassium: 4 mmol/L (ref 3.5–5.1)
Sodium: 142 mmol/L (ref 135–145)

## 2022-11-07 LAB — CBC WITH DIFFERENTIAL/PLATELET
Abs Immature Granulocytes: 0.03 10*3/uL (ref 0.00–0.07)
Basophils Absolute: 0 10*3/uL (ref 0.0–0.1)
Basophils Relative: 0 %
Eosinophils Absolute: 0 10*3/uL (ref 0.0–0.5)
Eosinophils Relative: 0 %
HCT: 28.8 % — ABNORMAL LOW (ref 39.0–52.0)
Hemoglobin: 8.8 g/dL — ABNORMAL LOW (ref 13.0–17.0)
Immature Granulocytes: 1 %
Lymphocytes Relative: 16 %
Lymphs Abs: 1 10*3/uL (ref 0.7–4.0)
MCH: 28.7 pg (ref 26.0–34.0)
MCHC: 30.6 g/dL (ref 30.0–36.0)
MCV: 93.8 fL (ref 80.0–100.0)
Monocytes Absolute: 0.8 10*3/uL (ref 0.1–1.0)
Monocytes Relative: 12 %
Neutro Abs: 4.8 10*3/uL (ref 1.7–7.7)
Neutrophils Relative %: 71 %
Platelets: 266 10*3/uL (ref 150–400)
RBC: 3.07 MIL/uL — ABNORMAL LOW (ref 4.22–5.81)
RDW: 14.2 % (ref 11.5–15.5)
WBC: 6.6 10*3/uL (ref 4.0–10.5)
nRBC: 0 % (ref 0.0–0.2)

## 2022-11-07 LAB — APTT: aPTT: 53 seconds — ABNORMAL HIGH (ref 24–36)

## 2022-11-07 SURGERY — PERIPHERAL VASCULAR THROMBECTOMY
Anesthesia: Moderate Sedation | Laterality: Right

## 2022-11-07 MED ORDER — LEVOFLOXACIN IN D5W 750 MG/150ML IV SOLN
750.0000 mg | INTRAVENOUS | Status: AC
  Administered 2022-11-07 – 2022-11-09 (×3): 750 mg via INTRAVENOUS
  Filled 2022-11-07 (×3): qty 150

## 2022-11-07 MED ORDER — DIPHENHYDRAMINE HCL 50 MG/ML IJ SOLN: 50.0000 mg | Freq: Once | INTRAMUSCULAR | Status: AC | PRN

## 2022-11-07 MED ORDER — SODIUM CHLORIDE 0.9 % IV SOLN: INTRAVENOUS | Status: DC

## 2022-11-07 MED ORDER — METHYLPREDNISOLONE SODIUM SUCC 125 MG IJ SOLR
INTRAMUSCULAR | Status: AC
  Administered 2022-11-07: 125 mg via INTRAVENOUS
  Filled 2022-11-07: qty 2

## 2022-11-07 MED ORDER — ALTEPLASE 2 MG IJ SOLR
INTRAMUSCULAR | Status: AC
  Filled 2022-11-07: qty 8

## 2022-11-07 MED ORDER — METHYLPREDNISOLONE SODIUM SUCC 125 MG IJ SOLR
INTRAMUSCULAR | Status: AC
  Filled 2022-11-07: qty 2

## 2022-11-07 MED ORDER — METHYLPREDNISOLONE SODIUM SUCC 125 MG IJ SOLR: 125.0000 mg | Freq: Once | INTRAMUSCULAR | Status: AC | PRN

## 2022-11-07 MED ORDER — MIDAZOLAM HCL 2 MG/2ML IJ SOLN
INTRAMUSCULAR | Status: DC | PRN
  Administered 2022-11-07 (×2): 1 mg via INTRAVENOUS

## 2022-11-07 MED ORDER — HEPARIN SODIUM (PORCINE) 1000 UNIT/ML IJ SOLN
INTRAMUSCULAR | Status: AC
  Filled 2022-11-07: qty 10

## 2022-11-07 MED ORDER — DIPHENHYDRAMINE HCL 50 MG/ML IJ SOLN
INTRAMUSCULAR | Status: AC
  Administered 2022-11-07: 50 mg via INTRAVENOUS
  Filled 2022-11-07: qty 1

## 2022-11-07 MED ORDER — FENTANYL CITRATE (PF) 100 MCG/2ML IJ SOLN
INTRAMUSCULAR | Status: AC
  Filled 2022-11-07: qty 2

## 2022-11-07 MED ORDER — CIPROFLOXACIN IN D5W 400 MG/200ML IV SOLN
INTRAVENOUS | Status: AC
  Administered 2022-11-07: 400 mg via INTRAVENOUS
  Filled 2022-11-07: qty 200

## 2022-11-07 MED ORDER — ALTEPLASE 1 MG/ML SYRINGE FOR VASCULAR PROCEDURE
INTRAMUSCULAR | Status: DC | PRN
  Administered 2022-11-07: 8 mg via INTRA_ARTERIAL

## 2022-11-07 MED ORDER — MIDAZOLAM HCL 2 MG/2ML IJ SOLN
INTRAMUSCULAR | Status: AC
  Filled 2022-11-07: qty 2

## 2022-11-07 MED ORDER — MIDAZOLAM HCL 2 MG/ML PO SYRP: 8.0000 mg | ORAL_SOLUTION | Freq: Once | ORAL | Status: DC | PRN

## 2022-11-07 MED ORDER — FENTANYL CITRATE (PF) 100 MCG/2ML IJ SOLN
INTRAMUSCULAR | Status: DC | PRN
  Administered 2022-11-07 (×2): 50 ug via INTRAVENOUS

## 2022-11-07 MED ORDER — DOXYCYCLINE HYCLATE 100 MG PO TABS
100.0000 mg | ORAL_TABLET | Freq: Every day | ORAL | Status: DC
  Administered 2022-11-07 – 2022-11-10 (×4): 100 mg via ORAL
  Filled 2022-11-07 (×4): qty 1

## 2022-11-07 MED ORDER — IPRATROPIUM-ALBUTEROL 0.5-2.5 (3) MG/3ML IN SOLN
3.0000 mL | Freq: Two times a day (BID) | RESPIRATORY_TRACT | Status: DC
  Administered 2022-11-07 – 2022-11-08 (×2): 3 mL via RESPIRATORY_TRACT
  Filled 2022-11-07 (×2): qty 3

## 2022-11-07 MED ORDER — HYDROMORPHONE HCL 1 MG/ML IJ SOLN: 1.0000 mg | Freq: Once | INTRAMUSCULAR | Status: DC | PRN

## 2022-11-07 MED ORDER — CIPROFLOXACIN IN D5W 400 MG/200ML IV SOLN
400.0000 mg | INTRAVENOUS | Status: DC
  Filled 2022-11-07: qty 200

## 2022-11-07 MED ORDER — ONDANSETRON HCL 4 MG/2ML IJ SOLN: 4.0000 mg | Freq: Four times a day (QID) | INTRAMUSCULAR | Status: DC | PRN

## 2022-11-07 MED ORDER — IODIXANOL 320 MG/ML IV SOLN
INTRAVENOUS | Status: DC | PRN
  Administered 2022-11-07: 60 mL

## 2022-11-07 MED ORDER — FAMOTIDINE 20 MG PO TABS
40.0000 mg | ORAL_TABLET | Freq: Once | ORAL | Status: AC | PRN
  Administered 2022-11-07: 40 mg via ORAL

## 2022-11-07 MED ORDER — FAMOTIDINE 20 MG PO TABS
ORAL_TABLET | ORAL | Status: AC
  Filled 2022-11-07: qty 2

## 2022-11-07 SURGICAL SUPPLY — 18 items
CANISTER PENUMBRA ENGINE (MISCELLANEOUS) IMPLANT
CATH BEACON 5 .035 65 KMP TIP (CATHETERS) IMPLANT
CATH LIGHTNI FLASH 16XTORQ 100 (CATHETERS) IMPLANT
CATH LIGHTNING FLASH XTORQ 100 (CATHETERS) ×1
CATH VERT 5FR 125CM (CATHETERS) IMPLANT
CLOSURE PERCLOSE PROSTYLE (VASCULAR PRODUCTS) IMPLANT
DRYSEAL FLEXSHEATH 16FR 33CM (SHEATH) ×1
GLIDEWIRE ADV .035X180CM (WIRE) IMPLANT
KIT MICROPUNCTURE NIT STIFF (SHEATH) IMPLANT
NDL ENTRY 21GA 7CM ECHOTIP (NEEDLE) IMPLANT
NEEDLE ENTRY 21GA 7CM ECHOTIP (NEEDLE) ×1 IMPLANT
PACK ANGIOGRAPHY (CUSTOM PROCEDURE TRAY) ×1 IMPLANT
SHEATH BRITE TIP 6FRX11 (SHEATH) IMPLANT
SHEATH BRITE TIP 8FRX11 (SHEATH) IMPLANT
SHEATH DRYSEAL FLEX 16FR 33CM (SHEATH) IMPLANT
SUT MNCRL AB 4-0 PS2 18 (SUTURE) IMPLANT
SUT PROLENE 0 CT 1 30 (SUTURE) IMPLANT
WIRE GUIDERIGHT .035X150 (WIRE) IMPLANT

## 2022-11-07 NOTE — Consult Note (Signed)
ANTICOAGULATION CONSULT NOTE  Pharmacy Consult for Bivalirudin infusion Indication: DVT  Allergies  Allergen Reactions   Penicillins Swelling   Shrimp [Shellfish Allergy] Hives   Cephalosporins Hives and Itching   Cisatracurium Other (See Comments)    Other reaction(s): Unknown Other reaction(s): Unknown    Heparin Hives and Swelling    Patient's daughter reports patient had swelling/hives during hospital admission in [place]. They were on several medications at once so she is unsure whether it was VANCOMYCIN that caused this reaction or another concomitant medication.    Rocuronium Other (See Comments)    Other reaction(s): Unknown Other reaction(s): Unknown    Vancomycin Hives and Swelling    Patient's daughter reports patient had swelling/hives during hospital admission in [place]. They were on several medications at once so she is unsure whether it was VANCOMYCIN that caused this reaction or another concomitant medication.    Wound Dressings Rash    Tegaderm     Patient Measurements: Height: '5\' 10"'$  (177.8 cm) Weight: 90.7 kg (200 lb) IBW/kg (Calculated) : 73   Vital Signs: Temp: 97.6 F (36.4 C) (12/18 0448) Temp Source: Oral (12/18 0448) BP: 96/76 (12/18 0448) Pulse Rate: 72 (12/18 0448)  Labs: Recent Labs    11/05/22 1611 11/05/22 1930 11/06/22 0117 11/06/22 0600 11/06/22 1830 11/07/22 0503  HGB 9.7*  --   --  9.4*  --  8.8*  HCT 32.3*  --   --  30.5*  --  28.8*  PLT 274  --   --  249  --  266  APTT 20*  --    < > 58* 52* 53*  LABPROT 14.9  --   --   --   --   --   INR 1.2  --   --   --   --   --   CREATININE 0.91  --   --   --   --   --   TROPONINIHS  --  5  --   --   --   --    < > = values in this interval not displayed.     Estimated Creatinine Clearance: 83.1 mL/min (by C-G formula based on SCr of 0.91 mg/dL).   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  Last reported dose 12/16 @ 0900  Assessment: 71 yo male with a history of recurrent lower extremity DVTs.  Presented to the ED with worsening swelling and pain in lower extremities.  Patient was recently hospitalized and underwent a thrombectomy.  Had been on Eliquis at home.  Ultrasound confirmed DVTs.  Patient reports allergy to heparin so being started on bivalirudin.  Goal of Therapy:  aPTT 50-85 seconds Monitor platelets by anticoagulation protocol: Yes   12/17 0117 aPTT 61, therapeutic x 1 12/17 0600 aPTT 58, therapeutic x 2 12/17 1830 aPTT 52, therapeutic x 3 12/18 0503 aPTT 53, therapeutic x 4  Plan:  -Continue bivalirudin infusion at 0.15 mg/kg/hr (13.5 mg/hr). -Will recheck aPTT 12/19 with am labs -CBC daily  Renda Rolls, PharmD, [facility] 11/07/2022 5:39 AM

## 2022-11-07 NOTE — Consult Note (Incomplete)
Hematology/Oncology Consult note [facility] [phone] [phone]  Patient Care Team: Norm Parcel, MD as PCP - General (Family Medicine)   Name of the patient: Russell [name]  [id number]  1951-03-25    Reason for referral- ***   Referring physician- ***  Date of visit: '@TODAY'$ @   History of presenting illness- ***  ECOG PS- ***  Pain scale- ***   Review of systems- ROS  Allergies  Allergen Reactions   Penicillins Swelling   Shrimp [Shellfish Allergy] Hives   Cephalosporins Hives and Itching   Cisatracurium Other (See Comments)    Other reaction(s): Unknown Other reaction(s): Unknown    Heparin Hives and Swelling    Patient's daughter reports patient had swelling/hives during hospital admission in [place]. They were on several medications at once so she is unsure whether it was VANCOMYCIN that caused this reaction or another concomitant medication.    Rocuronium Other (See Comments)    Other reaction(s): Unknown Other reaction(s): Unknown    Vancomycin Hives and Swelling    Patient's daughter reports patient had swelling/hives during hospital admission in [place]. They were on several medications at once so she is unsure whether it was VANCOMYCIN that caused this reaction or another concomitant medication.    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) 10/25/2022   Overweight (BMI 25.0-29.9) 04/08/2022   HTN (hypertension) 04/07/2022   Pulmonary embolism ([place]) 04/07/2022   MRSA infection 04/07/2022   Peripheral neuropathy 04/07/2022   DVT, recurrent, lower extremity, acute, right ([place]) 04/07/2022   Fever 04/07/2022   Campylobacter  diarrhea 04/07/2022   Knee pain 09/22/2021   Dyslipidemia 09/05/2021   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   Peripheral polyneuropathy 02/10/2021   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   Primary osteoarthritis of left knee 01/06/2021   Diverticulitis of colon with perforation 05/03/2020   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   Carpal joint sprain 09/11/2019   Cervical spondylosis without myelopathy 09/11/2019   DDD (degenerative disc disease), cervical 09/11/2019   Lumbosacral stenosis 08/30/2019   Degeneration of lumbosacral intervertebral disc 08/30/2019   Unilateral vocal cord paralysis 08/03/2018   Hypotension 02/10/2018   Acute blood loss anemia 02/10/2018   S/P spinal fusion 01/17/2018   Other specified abnormal immunological findings in serum 11/27/2017   Postoperative or surgical complication 00/86/7619   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[place]) 11/25/2017   Fluid collection at surgical site 11/25/2017   Delirium due to another medical condition 10/05/2017   BMI 26.0-26.9,adult 06/26/2017   GERD (gastroesophageal reflux disease) 05/09/2017   Chronic cough 11/21/2016   Heterotopic ossification 03/01/2016   DRESS syndrome 11/27/2015   Pyuria 11/27/2015   Oropharyngeal dysphagia 11/27/2015   Macrocytic anemia 11/27/2015   History of pulmonary embolism 11/27/2015   History of malnutrition 11/27/2015   Acute encephalopathy 11/27/2015   Intensive care (ICU) myopathy 11/27/2015   Urinary retention 11/27/2015   Personal history of other malignant neoplasm of skin 07/17/2015   Hyperlipidemia, mixed 05/05/2015   Excessive cerumen in both ear canals 04/23/2015   Skin lesions, generalized 04/23/2015   Need for vaccination 04/23/2015   Increased frequency of urination 50/93/2671   Helicobacter pylori (H. pylori) infection 02/11/2014   Dyspepsia  02/05/2014   Neck pain 10/11/2013   Pain in joint, pelvic region and thigh 10/11/2013   Low back pain 10/11/2013     Past  Medical History:  Diagnosis Date   Arthritis    Back pain    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   Hyperlipidemia    Hypertension      Past Surgical History:  Procedure Laterality Date   BACK SURGERY     COLONOSCOPY WITH PROPOFOL N/A 11/05/2019   Procedure: COLONOSCOPY WITH PROPOFOL;  Surgeon: [name], MD;  Location: [facility] ENDOSCOPY;  Service: Gastroenterology;  Laterality: N/A;   PARTIAL COLECTOMY N/A 02/11/2020   Procedure: PARTIAL COLECTOMY -- sigmoid;  Surgeon: Olean Ree, MD;  Location: [facility] ORS;  Service: General;  Laterality: N/A;   PERIPHERAL VASCULAR THROMBECTOMY Right 10/07/2022   Procedure: PERIPHERAL VASCULAR THROMBECTOMY;  Surgeon: Algernon Huxley, MD;  Location: [place] [facility] LAB;  Service: Cardiovascular;  Laterality: Right;   PERIPHERAL VASCULAR THROMBECTOMY N/A 10/27/2022   Procedure: PERIPHERAL VASCULAR THROMBECTOMY;  Surgeon: Algernon Huxley, MD;  Location: [place] [facility];  Service: Cardiovascular;  Laterality: N/A;   PERIPHERAL VASCULAR THROMBECTOMY Right 11/07/2022   Procedure: PERIPHERAL VASCULAR THROMBECTOMY;  Surgeon: Algernon Huxley, MD;  Location: [place] [facility];  Service: Cardiovascular;  Laterality: Right;   SPINE SURGERY     TAKE DOWN OF INTESTINAL FISTULA N/A 02/11/2020   Procedure: TAKE DOWN OF INTESTINAL FISTULA -- colovesical fistula;  Surgeon: Olean Ree, MD;  Location: [facility] ORS;  Service: General;  Laterality: N/A;   TRACHEAL SURGERY      Social History   Socioeconomic History   Marital status: Married    Spouse name: Not on file   Number of children: Not on file   Years of education: Not on file   Highest education level: Not on file  Occupational History   Not on file  Tobacco Use   Smoking status: Never   Smokeless tobacco: Never  Vaping Use   Vaping Use: Never used  Substance and Sexual Activity   Alcohol use: No   Drug use: No   Sexual activity: Yes    Birth control/protection: None  Other Topics Concern   Not on file   Social History Narrative   Not on file   Social Determinants of Health   Financial Resource Strain: Not on file  Food Insecurity: No Food Insecurity (11/05/2022)   Hunger Vital Sign    Worried About Running Out of Food in the Last Year: Never true    Ran Out of Food in the Last Year: Never true  Transportation Needs: No Transportation Needs (11/05/2022)   PRAPARE - Hydrologist (Medical): No    Lack of Transportation (Non-Medical): No  Physical Activity: Not on file  Stress: Not on file  Social Connections: Not on file  Intimate Partner Violence: Not At Risk (11/05/2022)   Humiliation, Afraid, Rape, and Kick questionnaire    Fear of Current or Ex-Partner: No    Emotionally Abused: No    Physically Abused: No    Sexually Abused: No     Family History  Problem Relation Age of Onset   Prostate cancer Neg Hx    Bladder Cancer Neg Hx    Kidney cancer Neg Hx      Current Facility-Administered Medications:    acetaminophen (TYLENOL) tablet 650 mg, 650 mg, Oral, Q6H PRN **OR** acetaminophen (TYLENOL) suppository 650 mg, 650 mg, Rectal, Q6H PRN, Lucky Cowboy, Erskine Squibb, MD  bivalirudin (ANGIOMAX) 250 mg in sodium chloride 0.9 % 500 mL (0.5 mg/mL) infusion, 0.15 mg/kg/hr, Intravenous, Continuous, Dew, Erskine Squibb, MD, Last Rate: 27.2 mL/hr at 11/07/22 1547, 0.15 mg/kg/hr at 11/07/22 1547   budesonide (PULMICORT) nebulizer solution 0.5 mg, 0.5 mg, Nebulization, BID, Lucky Cowboy, Erskine Squibb, MD, 0.5 mg at 11/07/22 3875   chlorpheniramine-HYDROcodone (TUSSIONEX) 10-8 MG/5ML suspension 5 mL, 5 mL, Oral, Q12H PRN, Algernon Huxley, MD   doxycycline (VIBRA-TABS) tablet 100 mg, 100 mg, Oral, Daily, Cyndia Skeeters, Taye T, MD, 100 mg at 11/07/22 1531   famotidine (PEPCID) tablet 20 mg, 20 mg, Oral, Daily, Lucky Cowboy, Erskine Squibb, MD, 20 mg at 11/06/22 1556   gabapentin (NEURONTIN) capsule 300 mg, 300 mg, Oral, TID, Lucky Cowboy, Erskine Squibb, MD, 300 mg at 11/07/22 1531   HYDROmorphone (DILAUDID) injection 0.5-1 mg, 0.5-1 mg,  Intravenous, Q2H PRN, Lucky Cowboy, Erskine Squibb, MD   HYDROmorphone (DILAUDID) injection 1 mg, 1 mg, Intravenous, Once PRN, Lucky Cowboy, Erskine Squibb, MD   ipratropium-albuterol (DUONEB) 0.5-2.5 (3) MG/3ML nebulizer solution 3 mL, 3 mL, Nebulization, BID, Gonfa, Taye T, MD   levofloxacin (LEVAQUIN) IVPB 750 mg, 750 mg, Intravenous, Q24H, Algernon Huxley, MD, Last Rate: 100 mL/hr at 11/07/22 1547, Infusion Verify at 11/07/22 1547   ondansetron (ZOFRAN) tablet 4 mg, 4 mg, Oral, Q6H PRN **OR** ondansetron (ZOFRAN) injection 4 mg, 4 mg, Intravenous, Q6H PRN, Lucky Cowboy, Erskine Squibb, MD   ondansetron (ZOFRAN) injection 4 mg, 4 mg, Intravenous, Q6H PRN, Lucky Cowboy, Erskine Squibb, MD   oxyCODONE (Oxy IR/ROXICODONE) immediate release tablet 5 mg, 5 mg, Oral, Q4H PRN, Lucky Cowboy, Erskine Squibb, MD   polyethylene glycol (MIRALAX / GLYCOLAX) packet 17 g, 17 g, Oral, Daily PRN, Algernon Huxley, MD   predniSONE (DELTASONE) tablet 40 mg, 40 mg, Oral, Q breakfast, Lucky Cowboy, Erskine Squibb, MD, 40 mg at 11/06/22 1135   sodium chloride flush (NS) 0.9 % injection 3 mL, 3 mL, Intravenous, Q12H, Algernon Huxley, MD, 3 mL at 11/06/22 2133   traZODone (DESYREL) tablet 50 mg, 50 mg, Oral, QHS PRN, Algernon Huxley, MD, 50 mg at 11/06/22 2320   Physical exam:  Vitals:   11/07/22 0948 11/07/22 1000 11/07/22 1015 11/07/22 1026  BP: 102/68 103/74 100/72 100/75  Pulse: 64 71 64 67  Resp: '14 14 15 15  '$ Temp:      TempSrc:      SpO2: 93% 93% 95% 94%  Weight:      Height:       Physical Exam        Latest Ref Rng & Units 11/07/2022    5:03 AM  CMP  Glucose 70 - 99 mg/dL 113   BUN 8 - 23 mg/dL 19   Creatinine 0.61 - 1.24 mg/dL 0.98   Sodium 135 - 145 mmol/L 142   Potassium 3.5 - 5.1 mmol/L 4.0   Chloride 98 - 111 mmol/L 113   CO2 22 - 32 mmol/L 22   Calcium 8.9 - 10.3 mg/dL 8.9       Latest Ref Rng & Units 11/07/2022    5:03 AM  CBC  WBC 4.0 - 10.5 K/uL 6.6   Hemoglobin 13.0 - 17.0 g/dL 8.8   Hematocrit 39.0 - 52.0 % 28.8   Platelets 150 - 400 K/uL 266     '@IMAGES'$ @  CT VENOGRAM  ABD/PELVIS/LOWER EXT BILAT  Result Date: 11/07/2022 CLINICAL DATA:  71 year old male with a history of DVT EXAM: CT VENOGRAM ABDOMEN AND PELVIS AND LOWER EXTREMITY BILATERAL TECHNIQUE: Axial sequential CT  images of the abdomen pelvis were performed after administration of standard IV contrast bolus. Timing was acquired with attention to the venous structures. Reformatted images in coronal and sagittal planes were performed on a separate workstation. RADIATION DOSE REDUCTION: This exam was performed according to the departmental dose-optimization program which includes automated exposure control, adjustment of the mA and/or kV according to patient size and/or use of iterative reconstruction technique. CONTRAST:  132m OMNIPAQUE IOHEXOL 350 MG/ML SOLN COMPARISON:  05/01/2020 FINDINGS: VASCULAR Arterial: Note that the evaluation of the arterial structures is limited secondary to the timing of the contrast bolus, optimized for venous structures per the protocol. Aorta: Atherosclerotic changes of the abdominal aorta. No pedunculated plaque or ulcerated plaque. Diameter of the infrarenal abdominal aorta estimated 19 mm. Celiac: Patent without significant calcified plaque. SMA: Patent without significant calcified plaque. Renals: - Right: Single right renal artery without significant calcified plaque at the origin. - Left: Single left renal artery without significant calcified plaque at the origin. IMA: Inferior mesenteric artery is patent. Right lower extremity: Tortuosity of the right iliac system. Hypogastric artery patent. Minimal calcified atherosclerotic plaque. Common femoral artery patent. Proximal profunda femoris and SFA patent. Left lower extremity: Tortuosity of the left iliac system. Mild atherosclerotic plaque. Hypogastric arteries patent. Common femoral artery patent. Proximal profunda femoris and SFA patent. Venous: Systemic: IVC: Hepatic IVC unremarkable. IVC filter in place, which appears on prior  plain film to be a retrievable [place]. The tines penetrate the wall of the IVC. Anterior time penetrates the second portion of the duodenum. Posterior tine appears imbedded within an osteophyte of the lumbar vertebral body. DVT extends from the IVC filter inferiorly through the lower IVC to the confluence of the iliac veins. Iliac veins: Occlusive DVT extends through both of the iliac system, from the IVC through the common and external iliac veins. Veins are expanded without calcification. Femoral veins: Central filling defect of the right common femoral vein. The most proximal femoral vein and profunda vein appear to have relatively normal contrast opacification. Central filling defect of the left common femoral vein. There appears to be filling defect within the most proximal imaged left femoral vein. Contrast opacifies the profunda system relatively within normal limits. Renal veins are patent Portal system: Hepatic veins patent. Left and right portal veins patent. Main portal vein patent. Splenic vein patent. SMV and IMV patent. Review of the MIP images confirms the above findings. NON-VASCULAR Lower chest: Architectural distortion of the bilateral lower lungs compatible with nonspecific fibrosis. No confluent airspace disease. Respiratory motion somewhat limits evaluation. Bronchiectasis. Hepatobiliary: Unremarkable liver.  Unremarkable gallbladder. Pancreas: Unremarkable Spleen: Unremarkable. Adrenals/Urinary Tract: - Right adrenal gland: Unremarkable - Left adrenal gland: Unremarkable. - Right kidney: No hydronephrosis, nephrolithiasis, inflammation, or ureteral dilation. Focal cortical thinning of the lateral cortex of the lower pole right kidney. Simple cyst lateral cortex of the right kidney requiring no further follow-up. - Left Kidney: No hydronephrosis, nephrolithiasis, inflammation, or ureteral dilation. No focal lesion. - Urinary Bladder: Unremarkable. Stomach/Bowel: - Stomach: Hiatal hernia.   Otherwise unremarkable stomach. - Small bowel: Unremarkable - Appendix: Normal. - Colon: Diverticular disease without evidence of acute diverticulitis. Surgical anastomosis in the sigmoid colon of prior resection. Lymphatic: No adenopathy. Mesenteric: No free fluid or air. No mesenteric adenopathy. Reproductive: Unremarkable appearance of the pelvic organs. Other: Eventration of the midline abdomen, potentially surgical reconstruction of a ventral hernia versus laxity of the postoperative midline rectus abdominus. Musculoskeletal: Bridging Heterotopic ossification about the left hip. Surgical changes of prior  left hip fixation. No aggressive lytic or sclerotic lesions identified. No acute displaced fracture. Surgical changes of thoracolumbar fixation, incompletely imaged. IMPRESSION: Acute ilio caval and ilio femoral DVT, extending from a retrievable IVC filter inferiorly through the bilateral common femoral veins. CT is negative for DVT extension above the apex of the filter. The anterior tines appear to penetrate the duodenum, and there is a posterior tine embedded within anterior osteophyte of the lumbar spine. Aortic Atherosclerosis (ICD10-I70.0). Additional ancillary findings as above. Signed, Dulcy Fanny. Nadene Rubins, RPVI [facility] Rad[facility] Electronically Signed   By: Corrie Mckusick D.O.   On: 11/07/2022 10:02   PERIPHERAL VASCULAR CATHETERIZATION  Result Date: 11/07/2022 See surgical note for result.  VAS [place] LOWER EXTREMITY VENOUS (DVT)  Result Date: 11/07/2022  Lower Venous DVT Study Patient Name:  JA OHMAN  Date of Exam:   11/04/2022 Medical Rec #: [id number]       Accession #:    [id number] Date of Birth: 1951-06-02       Patient Gender: M Patient Age:   59 years Exam Location:  [facility] Procedure:      VAS [place] LOWER EXTREMITY VENOUS (DVT) Referring Phys: [name]  --------------------------------------------------------------------------------  Indications: Swelling, Pain, and Hx of RT LE DVT.  Risk Factors: DVT 10/25/2022: Rt LE DVT Surgery 10/07/2022: IVC Gram and Right Lower Extremity Venogram. Catheter thrombolysis with 8 mg of TPA. Mechanical Thrombectomy of the Right SFV,CFV EIV, CIV and IVC with the penumbra 16 device. 10/27/2022: IVC gram and Right Lower Extremity Venogram. Catheter directed thrombolysis with 8 mg of TPA to the Iliac and CFV. Mechanical Thrombectomy to the Right CFV, Iliac Veins and IVC with the Penumbra 16 lightening device. PTA of the CFV and Distal EIV with 12 mm balloon. Comparison Study: 10/25/2022 Performing Technologist: Almira Coaster RVS  Examination Guidelines: A complete evaluation includes B-mode imaging, spectral Doppler, color Doppler, and power Doppler as needed of all accessible portions of each vessel. Bilateral testing is considered an integral part of a complete examination. Limited examinations for reoccurring indications may be performed as noted. The reflux portion of the exam is performed with the patient in reverse Trendelenburg.  +---------+---------------+---------+-----------+----------+--------------+ RIGHT    CompressibilityPhasicitySpontaneityPropertiesThrombus Aging +---------+---------------+---------+-----------+----------+--------------+ CFV      None           No       No                                  +---------+---------------+---------+-----------+----------+--------------+ SFJ      None           Yes      Yes                                 +---------+---------------+---------+-----------+----------+--------------+ FV Prox  Partial        Yes      Yes                                 +---------+---------------+---------+-----------+----------+--------------+ FV Mid   Partial        Yes      Yes                                  +---------+---------------+---------+-----------+----------+--------------+  FV DistalFull           No       No                                  +---------+---------------+---------+-----------+----------+--------------+ PFV      None           No       No                                  +---------+---------------+---------+-----------+----------+--------------+ POP      Partial        Yes      Yes                                 +---------+---------------+---------+-----------+----------+--------------+ PTV      Full           Yes      Yes                                 +---------+---------------+---------+-----------+----------+--------------+ PERO     Full           Yes      Yes                                 +---------+---------------+---------+-----------+----------+--------------+ SSV      Full           Yes      Yes                                 +---------+---------------+---------+-----------+----------+--------------+   +----+---------------+---------+-----------+----------+--------------+ LEFTCompressibilityPhasicitySpontaneityPropertiesThrombus Aging +----+---------------+---------+-----------+----------+--------------+ CFV Full           Yes      Yes                                 +----+---------------+---------+-----------+----------+--------------+     Summary: RIGHT: - Findings consistent with acute deep vein thrombosis involving the right common femoral vein. - Findings consistent with chronic deep vein thrombosis involving the right common femoral vein, SF junction, right femoral vein, right popliteal vein, and right proximal profunda vein.  LEFT: - No evidence of common femoral vein obstruction.  *See table(s) above for measurements and observations. Electronically signed by [name] MD on 11/07/2022 at 8:54:37 AM.    Final    CT Angio Chest PE W and/or Wo Contrast  Result Date: 11/05/2022 CLINICAL DATA:  Right lower extremity  swelling. Pulmonary embolism suspected. High probability. History of thoracic spine fusion surgery. EXAM: CT ANGIOGRAPHY CHEST WITH CONTRAST TECHNIQUE: Multidetector CT imaging of the chest was performed using the standard protocol during bolus administration of intravenous contrast. Multiplanar CT image reconstructions and MIPs were obtained to evaluate the vascular anatomy. RADIATION DOSE REDUCTION: This exam was performed according to the departmental dose-optimization program which includes automated exposure control, adjustment of the mA and/or kV according to patient size and/or use of iterative reconstruction technique. CONTRAST:  132m OMNIPAQUE IOHEXOL 350 MG/ML SOLN COMPARISON:  Most recent chest films PA Lat 10/25/2022, with prior  CTA chest 10/06/2022, prior chest CT no contrast 06/11/2022. FINDINGS: Cardiovascular: Largest moderately enlarged with left chamber predominance. There is no pericardial effusion. There is prominence of the superior pulmonary veins. The pulmonary trunk slightly prominent 3.3 cm, unchanged, indicating arterial hypertension but without visible arterial embolism. There is calcification in the superior mitral ring, scattered calcification LAD coronary artery. The great vessels and aorta are tortuous. There are mild scattered calcific plaques. There is no aortic or great vessel aneurysm, stenosis or dissection. Mediastinum/Nodes: Scattered bilateral borderline prominent hilar lymph nodes are unchanged in the interval. Additional right and left paratracheal lymph nodes up to 8 mm in short axis, subcarinal nodes up to 10 mm short axis are unaltered as well. No new adenopathy. Small hiatal hernia. The thoracic esophagus, trachea and main bronchi are unremarkable. No thyroid mass. Lungs/Pleura: Diffuse bronchial thickening has increased, especially in the lower lobes. There is patchy increased opacity over portions of both lower lobes consistent with likely bronchopneumonia,  specifically in the right lower lobe superior segment and in the posteromedial basal left lower lobe. Findings are superimposed on chronic interstitial lung disease with subpleural reticulation and ground-glass opacities predominating in the bases and with honeycombing in both basal lateral areas. There are bilateral trace pleural effusions. There does not appear to be superimposed interstitial edema at this time. Upper Abdomen: No ascites or mesenteric congestion. No acute findings. Mild hepatic steatosis. Cholelithiasis without evidence of cholecystitis. Musculoskeletal: Spray artifact arises from again noted dorsal fusion rod construct C7-T11, with T3-5 laminectomies, central corpectomies and cylinder graft imbedded hardware with mature fusion. Moderate thoracic kyphosis is unchanged. The ribcage is intact, with multiple healed fracture deformities posteriorly. There is multilevel thoracic spine bridging enthesopathy, noted from T3 down. Review of the MIP images confirms the above findings. IMPRESSION: 1. No evidence of arterial embolus. Slightly prominent pulmonary trunk indicating arterial hypertension. 2. Cardiomegaly with prominence of the superior pulmonary veins but no superimposed interstitial edema. 3. Aortic and coronary artery atherosclerosis. 4. Chronic interstitial lung disease with honeycombing in the lateral bases. 5. Bilateral lower lobe bronchopneumonia with increased bronchial thickening especially in the lower lobes. 6. Trace pleural effusions. 7. Stable borderline prominent mediastinal and hilar lymph nodes. 8. Cholelithiasis without evidence of cholecystitis. 9. Hepatic steatosis. 10. Small hiatal hernia. 11. Postsurgical and degenerative changes of the spine with chronic thoracic kyphosis. Electronically Signed   By: Telford Nab M.D.   On: 11/05/2022 21:55   US Venous Img Lower Unilateral Right  Result Date: 11/05/2022 CLINICAL DATA:  History of DVT. Recent thrombectomy. Increased  groin pain. EXAM: RIGHT LOWER EXTREMITY VENOUS DOPPLER ULTRASOUND TECHNIQUE: Gray-scale sonography with compression, as well as color and duplex ultrasound, were performed to evaluate the deep venous system(s) from the level of the common femoral vein through the popliteal and proximal calf veins. COMPARISON:  Right lower extremity venous Doppler ultrasound 10/25/2022 FINDINGS: VENOUS There is occlusive thrombus within the common femoral vein. There is nonocclusive thrombus within the profunda femoral vein and femoral vein proximally, but normal flow distally. There is normal flow and compressibility throughout the popliteal vein and saphenofemoral junction. There is also nonocclusive thrombus within the posterior tibial vein. The peroneal vein is not visualized. Limited views of the contralateral common femoral vein were unable to be obtained secondary to patient pain. OTHER None. Limitations: none IMPRESSION: 1. Occlusive thrombus within the right common femoral vein. 2. Nonocclusive thrombus within the profunda femoral vein and femoral vein proximally. 3. Nonocclusive thrombus within the posterior tibial vein.  Electronically Signed   By: Ronney Asters M.D.   On: 11/05/2022 17:27   ECHOCARDIOGRAM COMPLETE  Result Date: 10/29/2022    ECHOCARDIOGRAM REPORT   Patient Name:   KAIDE GAGE Date of Exam: 10/29/2022 Medical Rec #:  [id number]      Height:       70.0 in Accession #:    [id number]     Weight:       213.4 lb Date of Birth:  May 10, 1951      BSA:          2.146 m Patient Age:    61 years       BP:           102/66 mmHg Patient Gender: M              HR:           78 bpm. Exam Location:  [facility] Procedure: 2D Echo and Strain Analysis Indications:     Shock  History:         Patient has prior history of Echocardiogram examinations, most                  recent 04/07/2022.  Sonographer:     Kathlen Brunswick RDCS Referring Phys:  [id number] Bradly Bienenstock Diagnosing Phys: Ida Rogue MD  Sonographer Comments:  Global longitudinal strain was attempted. IMPRESSIONS  1. Left ventricular ejection fraction, by estimation, is 60 to 65%. The left ventricle has normal function. The left ventricle has no regional wall motion abnormalities. There is moderate left ventricular hypertrophy. Left ventricular diastolic parameters are consistent with Grade I diastolic dysfunction (impaired relaxation). The average left ventricular global longitudinal strain is -23.8 %.  2. Right ventricular systolic function is normal. The right ventricular size is normal. Tricuspid regurgitation signal is inadequate for assessing PA pressure.  3. Left atrial size was mildly dilated.  4. The mitral valve is normal in structure. Mild to moderate mitral valve regurgitation. No evidence of mitral stenosis.  5. The aortic valve is normal in structure. Aortic valve regurgitation is not visualized. Aortic valve sclerosis is present, with no evidence of aortic valve stenosis.  6. There is mild dilatation of the aortic root, measuring 40 mm.  7. The inferior vena cava is normal in size with greater than 50% respiratory variability, suggesting right atrial pressure of 3 mmHg. FINDINGS  Left Ventricle: Left ventricular ejection fraction, by estimation, is 60 to 65%. The left ventricle has normal function. The left ventricle has no regional wall motion abnormalities. The average left ventricular global longitudinal strain is -23.8 %. The left ventricular internal cavity size was normal in size. There is moderate left ventricular hypertrophy. Left ventricular diastolic parameters are consistent with Grade I diastolic dysfunction (impaired relaxation). Right Ventricle: The right ventricular size is normal. No increase in right ventricular wall thickness. Right ventricular systolic function is normal. Tricuspid regurgitation signal is inadequate for assessing PA pressure. Left Atrium: Left atrial size was mildly dilated. Right Atrium: Right atrial size was normal in  size. Pericardium: There is no evidence of pericardial effusion. Mitral Valve: The mitral valve is normal in structure. Mild to moderate mitral valve regurgitation. No evidence of mitral valve stenosis. Tricuspid Valve: The tricuspid valve is normal in structure. Tricuspid valve regurgitation is mild . No evidence of tricuspid stenosis. Aortic Valve: The aortic valve is normal in structure. Aortic valve regurgitation is not visualized. Aortic valve sclerosis is present, with no evidence of aortic valve stenosis.  Aortic valve mean gradient measures 7.0 mmHg. Aortic valve peak gradient measures 14.1 mmHg. Aortic valve area, by VTI measures 2.89 cm. Pulmonic Valve: The pulmonic valve was normal in structure. Pulmonic valve regurgitation is not visualized. No evidence of pulmonic stenosis. Aorta: The aortic root is normal in size and structure. There is mild dilatation of the aortic root, measuring 40 mm. Venous: The inferior vena cava is normal in size with greater than 50% respiratory variability, suggesting right atrial pressure of 3 mmHg. IAS/Shunts: No atrial level shunt detected by color flow Doppler.  LEFT VENTRICLE PLAX 2D LVIDd:         4.20 cm     Diastology LVIDs:         2.90 cm     LV e' medial:    8.81 cm/s LV PW:         1.50 cm     LV E/e' medial:  9.2 LV IVS:        1.40 cm     LV e' lateral:   11.00 cm/s LVOT diam:     2.20 cm     LV E/e' lateral: 7.4 LV SV:         103 LV SV Index:   48          2D Longitudinal Strain LVOT Area:     3.80 cm    2D Strain GLS Avg:     -23.8 %  LV Volumes (MOD) LV vol d, MOD A2C: 77.1 ml LV vol d, MOD A4C: 85.9 ml LV vol s, MOD A2C: 34.0 ml LV vol s, MOD A4C: 27.6 ml LV SV MOD A2C:     43.1 ml LV SV MOD A4C:     85.9 ml LV SV MOD BP:      52.8 ml RIGHT VENTRICLE RV Basal diam:  2.50 cm RV S prime:     14.90 cm/s TAPSE (M-mode): 1.6 cm LEFT ATRIUM             Index        RIGHT ATRIUM           Index LA diam:        4.40 cm 2.05 cm/m   RA Area:     15.50 cm LA Vol  (A2C):   61.1 ml 28.48 ml/m  RA Volume:   30.80 ml  14.35 ml/m LA Vol (A4C):   79.0 ml 36.82 ml/m LA Biplane Vol: 73.8 ml 34.39 ml/m  AORTIC VALVE                     PULMONIC VALVE AV Area (Vmax):    2.47 cm      PV Vmax:       1.06 m/s AV Area (Vmean):   2.61 cm      PV Peak grad:  4.5 mmHg AV Area (VTI):     2.89 cm AV Vmax:           188.00 cm/s AV Vmean:          120.000 cm/s AV VTI:            0.355 m AV Peak Grad:      14.1 mmHg AV Mean Grad:      7.0 mmHg LVOT Vmax:         122.00 cm/s LVOT Vmean:        82.400 cm/s LVOT VTI:          0.270 m LVOT/AV  VTI ratio: 0.76  AORTA Ao Root diam: 4.00 cm Ao Asc diam:  3.10 cm MITRAL VALVE MV Area (PHT): 3.54 cm     SHUNTS MV Decel Time: 214 msec     Systemic VTI:  0.27 m MV E velocity: 81.20 cm/s   Systemic Diam: 2.20 cm MV A velocity: 121.00 cm/s MV E/A ratio:  0.67 Ida Rogue MD Electronically signed by Ida Rogue MD Signature Date/Time: 10/29/2022/12:23:32 PM    Final    PERIPHERAL VASCULAR CATHETERIZATION  Result Date: 10/27/2022 See surgical note for result.  US Venous Img Lower Unilateral Right  Result Date: 10/25/2022 CLINICAL DATA:  Pain and swelling EXAM: Right LOWER EXTREMITY VENOUS DOPPLER ULTRASOUND TECHNIQUE: Gray-scale sonography with compression, as well as color and duplex ultrasound, were performed to evaluate the deep venous system(s) from the level of the common femoral vein through the popliteal and proximal calf veins. COMPARISON:  None Available. FINDINGS: VENOUS There is evidence of occlusive deep venous thrombosis in right common femoral vein. Nonocclusive DVT is noted in the proximal femoral vein. Rest of the major deep veins appear patent. There is nonocclusive superficial phlebitis in greater saphenous vein. Limited views of the contralateral common femoral vein are unremarkable. OTHER None. Limitations: none IMPRESSION: Occlusive DVT is seen in right common femoral vein. Nonocclusive DVT is noted in proximal course of  right femoral vein. Superficial phlebitis is noted in the right greater saphenous vein. Electronically Signed   By: Elmer Picker M.D.   On: 10/25/2022 13:33   DG Chest 2 View  Result Date: 10/25/2022 CLINICAL DATA:  Cough and shortness of breath.  Leg swelling. EXAM: CHEST - 2 VIEW COMPARISON:  06/11/2022 FINDINGS: Thoracic spine fixation, suboptimally evaluated. Upper thoracic vertebral corpectomy. IVC filter. Patient rotated to the right. Mild cardiomegaly. No pleural effusion or pneumothorax. Peripheral and basilar predominant interstitial thickening likely corresponds to interstitial lung disease on 10/06/2022 CT. No superimposed congestive failure or lobar consolidation. IMPRESSION: Interstitial lung disease, grossly similar to the prior CTA chest. Consider pulmonology consultation for eventual dedicated high-resolution chest CT. Cardiomegaly without superimposed congestive failure or acute process. Electronically Signed   By: Abigail Miyamoto M.D.   On: 10/25/2022 12:26    Assessment and plan- Patient is a 71 y.o. male ***   Thank you for this kind referral and the opportunity to participate in the care of this  Patient   Visit Diagnosis 1. Acute deep vein thrombosis (DVT) of proximal vein of right lower extremity ([place])   2. Rash     Dr. Randa Evens, MD, MPH [facility] [phone] 11/07/2022

## 2022-11-07 NOTE — Op Note (Signed)
[facility]   OPERATIVE NOTE   PRE-OPERATIVE DIAGNOSIS: extensive, recurrent RLE DVT and IVC thrombosis  POST-OPERATIVE DIAGNOSIS: same   PROCEDURE: 1.   US guidance for vascular access to right popliteal vein 2.   Catheter placement into right iliac vein from right popliteal approach 3.   IVC gram and right lower extremity venogram 4.   Catheter directed thrombolysis with 8 mg of tPA to the right iliac and common femoral veins  5.   Mechanical thrombectomy to right common femoral vein, external iliac vein, common iliac vein, and IVC with the penumbra 16 flash/lightening device     SURGEON: [name], MD  ASSISTANT(S): none  ANESTHESIA: local with moderate conscious sedation for 37 minutes using 2 mg of Versed and 100 mcg of Fentanyl  ESTIMATED BLOOD LOSS: 400 cc  FINDING(S): 1.  The popliteal vein and superficial femoral vein were patent.  The common femoral vein was thrombosed and the external and common iliac veins were thrombosed with no flow.  The IVC remain thrombosed up to the filter and I was unable to cross the filter even with an advantage wire and Kumpe catheter to get to the supra renal vena cava.  SPECIMEN(S):  none  INDICATIONS:    Patient is a 71 y.o. male who presents with recurrent, extensive right lower extremity DVT.  He has had previous thrombectomy and has a known IVC filter with thrombus and occlusion.  Patient has marked leg swelling and pain.  Venous intervention is performed to reduce the symtpoms and avoid long term postphlebitic symptoms.    DESCRIPTION: After obtaining full informed written consent, the patient was brought back to the vascular suite and placed supine upon the table. Moderate conscious sedation was administered during a face to face encounter with the patient throughout the procedure with my supervision of the RN administering medicines and monitoring the patient's vital signs, pulse oximetry, telemetry and mental  status throughout from the start of the procedure until the patient was taken to the recovery room.  After obtaining adequate anesthesia, the patient was prepped and draped in the standard fashion.    The patient was then placed into the prone position.  The right popliteal vein was then accessed under direct ultrasound guidance without difficulty with a micropuncture needle and a permanent image was recorded.  A micropuncture wire and sheath were placed and then upsized to a 6 [place] sheath.  A ProGlide device was placed in a preclose fashion.  I then upsized to an 16Fr sheath over a J wire.  A Kumpe catheter and Magic tourque wire were then advanced into the CFV and then external iliac vein and images were performed.  The popliteal vein and superficial femoral vein were patent.  The common femoral vein was thrombosed and the external and common iliac veins were thrombosed with no flow.  The IVC remain thrombosed up to the filter and I was unable to cross the filter even with an advantage wire and Kumpe catheter to get to the supra renal vena cava. I then used the Kumpe catheter and instilled 8 mg of tpa throughout the iliac and common femoral veins.  After this dwelled, I used the Penumbra Cat 16 Lightning/flash catheter and evacuated about 400 cc of effluent with mechanical thrombectomy throughout the iliac veins, CFV, and IVC.  I was able to evacuate the majority of the thrombus in the common femoral vein with only a small amount residual.  The iliac veins were also  able to be cleared by enlarge with only a small amount of residual thrombus and no greater than 50% stenosis identified.  The IVC filter remained occluded with what appeared to be chronic thrombus.  I was able to clear the IVC distally but the last few centimeters below the IVC filter remained occluded.  I cannot cross this and could not clear this thrombus.  At this point, we could consider coming back from a jugular approach and trying to remove  the filter although this may have some risk of pulmonary embolus.  No further intervention was going to be of benefit from our popliteal approach today.  I then elected to terminate the procedure.  The sheath was removed and a dressing was placed.  She was taken to the recovery room in stable condition having tolerated the procedure well.    COMPLICATIONS: None  CONDITION: Stable  [name] 11/07/2022 9:36 AM

## 2022-11-07 NOTE — Interval H&P Note (Signed)
History and Physical Interval Note:  11/07/2022 8:28 AM  Russell [name]  has presented today for surgery, with the diagnosis of RLE DVT.  The various methods of treatment have been discussed with the patient and family. After consideration of risks, benefits and other options for treatment, the patient has consented to  Procedure(s): PERIPHERAL VASCULAR THROMBECTOMY (Right) as a surgical intervention.  The patient's history has been reviewed, patient examined, no change in status, stable for surgery.  I have reviewed the patient's chart and labs.  Questions were answered to the patient's satisfaction.     [name]

## 2022-11-07 NOTE — Consult Note (Signed)
WOC Nurse Consult Note: Consult request for previous Stage 3 pressure injury. Injured area has now closed and scar tissue is present. No wound. Assessment confirmed by Bedside RN, Sherlene Shams.  [place] nursing team will not follow, but will remain available to this patient, the nursing and medical teams.    Thank you for inviting [place] to participate in this patient's Plan of Care.  Maudie Flakes, MSN, RN, CNS, [place], Serita Grammes, [employer], [employer] phone:  [phone]

## 2022-11-07 NOTE — Progress Notes (Signed)
PROGRESS NOTE  Russell [name] WYO:[id number] DOB: 10/28/1951   PCP: Norm Parcel, MD  Patient is from: Home.  DOA: 11/05/2022 LOS: 2  Chief complaints Chief Complaint  Patient presents with   Rash   Groin Pain   Leg Swelling     Brief Narrative / Interim history: 71 year old Circle speaking male with PMH of HTN, HL, RA, ILD, LLE DVT in 03/2022 and RLE DVT in 09/2022 s/p thrombectomy and recent hospitalization for recurrent RLE DVT from 12/5-12/12 when he underwent another mechanical thrombectomy and IVC filter placement and discharged to [facility].  Patient returns with RLE swelling, weakness and looking pale and readmitted for occlusive RLE DVT.  Family reports good compliance with his starter pack Eliquis after recent discharge.  And is allergic to heparin.  He was started on Bivalirudin drip.  He underwent another thrombectomy with right popliteal vein access with incomplete evacuation of proximal thrombus close to IVC filter which remained occluded.  Plan to go back to OR for another thrombectomy from jugular approach.  Subjective: Seen and examined earlier this afternoon after he returned from thrombectomy.  Currently eating his lunch.  Patient's wife and daughter at bedside.  Patient has no complaints.  Family has a lot of questions about plan of care.  Vascular surgery at bedside talking to family.  Objective: Vitals:   11/07/22 0948 11/07/22 1000 11/07/22 1015 11/07/22 1026  BP: 102/68 103/74 100/72 100/75  Pulse: 64 71 64 67  Resp: '14 14 15 15  '$ Temp:      TempSrc:      SpO2: 93% 93% 95% 94%  Weight:      Height:        Examination:  GENERAL: No apparent distress.  Nontoxic. HEENT: MMM.  Vision and hearing grossly intact.  NECK: Supple.  No apparent JVD.  RESP:  No IWOB.  Fair aeration bilaterally. CVS:  RRR. Heart sounds normal.  ABD/GI/GU: BS+. Abd soft, NTND.  MSK/EXT:  Moves extremities.  Ace wrap over RLE. SKIN: no apparent skin lesion or wound NEURO:  Awake, alert and oriented appropriately.  No apparent focal neuro deficit. PSYCH: Calm. Normal affect.   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  Patient reports good compliance with Eliquis after his recent discharge from the hospital. -S/p mechanical thrombectomy with right popliteal vein access but incomplete evacuation of thrombus and occluded IVC. -Continue Bivalirudin drip (allergy to Heparin). -Plan for repeat thrombectomy with jugular vein access on 12/20 -Pain control   Acute COPD exacerbation in the setting of BLLE pneumonia: part of his respiratory symptoms could be due to low preload from occluded IVC.  Improved. -Continue prednisone, Levaquin, Pulmicort and DuoNebs.   Bilateral Lower Lobe Pneumonia: History of severe allergy documented as DRESS to penicillin and cephalosporin and carbapenems and vancomycin.  -Discontinue Cipro and doxycycline.  Start Levaquin  Normocytic anemia: H&H seems to be stable but had EBL of 400 cc after mechanical thrombectomy on 12/18. Recent Labs    10/27/22 0414 10/27/22 1234 10/28/22 0323 10/29/22 0412 10/29/22 1124 10/30/22 0517 10/31/22 0637 11/05/22 1611 11/06/22 0600 11/07/22 0503  HGB 10.7* 11.1* 10.5* 8.3* 8.7* 8.5* 8.4* 9.7* 9.4* 8.8*  -Recheck CBC in the morning -Check anemia  panel in the morning  Acute on Chronic Cough: -Continue antitussive as needed.   History of MRSA infections/thoracic spine osteomyelitis in 2018: -Continue home doxycycline.  GERD:  -Continue Pepcid   Stage 3 Sacral Ulcers: - Consult wound care.  History of H1N1 Influenza Pneumonia complicated by ARDS s/p ECMO  Lumbar Degenerative Disc  Disease / Multiple Orthopedic and Spine surgeries Basal Cell Carcinoma  Body mass index is 28.7 kg/m.           DVT prophylaxis:  On bivalirudin drip.  Code Status: Full code Family Communication: Updated patient's wife and daughter at bedside Level of care: Med-Surg Status is: Inpatient Remains inpatient appropriate because: Extensive recurrent RLE DVT   Final disposition: TBD Consultants:  Vascular surgery  Sch Meds:  Scheduled Meds:  budesonide (PULMICORT) nebulizer solution  0.5 mg Nebulization BID   doxycycline  100 mg Oral Daily   famotidine  20 mg Oral Daily   gabapentin  300 mg Oral TID   ipratropium-albuterol  3 mL Nebulization BID   predniSONE  40 mg Oral Q breakfast   sodium chloride flush  3 mL Intravenous Q12H   Continuous Infusions:  bivalirudin (ANGIOMAX) 250 mg in sodium chloride 0.9 % 500 mL (0.5 mg/mL) infusion 0.15 mg/kg/hr (11/07/22 1011)   levofloxacin (LEVAQUIN) IV     PRN Meds:.acetaminophen **OR** acetaminophen, chlorpheniramine-HYDROcodone, HYDROmorphone (DILAUDID) injection, HYDROmorphone (DILAUDID) injection, ondansetron **OR** ondansetron (ZOFRAN) IV, ondansetron (ZOFRAN) IV, oxyCODONE, polyethylene glycol, traZODone  Antimicrobials: Anti-infectives (From admission, onward)    Start     Dose/Rate Route Frequency Ordered Stop   11/07/22 1700  levofloxacin (LEVAQUIN) IVPB 750 mg        750 mg 100 mL/hr over 90 Minutes Intravenous Every 24 hours 11/07/22 0819     11/07/22 1445  doxycycline (VIBRA-TABS) tablet 100 mg        100 mg Oral Daily 11/07/22 1352     11/07/22 0800  ciprofloxacin (CIPRO) tablet 500 mg  Status:  Discontinued        500 mg Oral 2 times daily 11/06/22 1347 11/07/22 0746   11/07/22 0758  ciprofloxacin (CIPRO) IVPB 400 mg  Status:  Discontinued        400 mg 200 mL/hr over 60 Minutes Intravenous 60 min pre-op 11/07/22 0758 11/07/22 1010   11/06/22 0900  cefTRIAXone (ROCEPHIN) 2 g in sodium chloride 0.9 % 100 mL IVPB   Status:  Discontinued        2 g 200 mL/hr over 30 Minutes Intravenous Every 24 hours 11/06/22 0744 11/06/22 1346   11/05/22 2030  doxycycline (VIBRA-TABS) tablet 100 mg  Status:  Discontinued        100 mg Oral Daily 11/05/22 2016 11/07/22 0746        I have personally reviewed the following labs and images: CBC: Recent Labs  Lab 11/05/22 1611 11/06/22 0600 11/07/22 0503  WBC 5.6 3.6* 6.6  NEUTROABS 3.2  --  4.8  HGB 9.7* 9.4* 8.8*  HCT 32.3* 30.5* 28.8*  MCV 95.6 93.6 93.8  PLT 274 249 266   BMP &GFR Recent Labs  Lab 11/05/22 1611 11/07/22 0503  NA 139 142  K 4.5 4.0  CL 109 113*  CO2 22 22  GLUCOSE 109* 113*  BUN 15 19  CREATININE 0.91 0.98  CALCIUM 8.9 8.9   Estimated Creatinine Clearance: 77.2 mL/min (by C-G formula based on SCr of 0.98 mg/dL). Liver & Pancreas: Recent Labs  Lab 11/05/22 1611  AST 15  ALT 15  ALKPHOS 74  BILITOT 1.0  PROT 7.1  ALBUMIN 4.0   No results for input(s): "LIPASE", "AMYLASE" in the last 168 hours. No results for input(s): "AMMONIA" in the last 168 hours. Diabetic: No results for input(s): "HGBA1C" in the last 72 hours. No results for input(s): "GLUCAP" in the last 168 hours. Cardiac Enzymes: No results for input(s): "CKTOTAL", "CKMB", "CKMBINDEX", "TROPONINI" in the last 168 hours. No results for input(s): "PROBNP" in the last 8760 hours. Coagulation Profile: Recent Labs  Lab 11/05/22 1611  INR 1.2   Thyroid Function Tests: No results for input(s): "TSH", "T4TOTAL", "FREET4", "T3FREE", "THYROIDAB" in the last 72 hours. Lipid Profile: No results for input(s): "CHOL", "HDL", "LDLCALC", "TRIG", "CHOLHDL", "LDLDIRECT" in the last 72 hours. Anemia Panel: No results for input(s): "VITAMINB12", "FOLATE", "FERRITIN", "TIBC", "IRON", "RETICCTPCT" in the last 72 hours. Urine analysis:    Component Value Date/Time   COLORURINE STRAW (A) 10/28/2022 1124   APPEARANCEUR CLEAR (A) 10/28/2022 1124   APPEARANCEUR Hazy (A)  03/10/2020 1151   LABSPEC 1.006 10/28/2022 1124   PHURINE 6.0 10/28/2022 1124   GLUCOSEU NEGATIVE 10/28/2022 1124   HGBUR SMALL (A) 10/28/2022 1124   BILIRUBINUR NEGATIVE 10/28/2022 1124   BILIRUBINUR Negative 03/10/2020 [place] 10/28/2022 1124   PROTEINUR NEGATIVE 10/28/2022 1124   NITRITE NEGATIVE 10/28/2022 1124   LEUKOCYTESUR NEGATIVE 10/28/2022 1124   Sepsis Labs: Invalid input(s): "PROCALCITONIN", "LACTICIDVEN"  Microbiology: No results found for this or any previous visit (from the past 240 hour(s)).  Radiology Studies: PERIPHERAL VASCULAR CATHETERIZATION  Result Date: 11/07/2022 See surgical note for result.     Mariaguadalupe Fialkowski T. [place]  If 7PM-7AM, please contact night-coverage [email or web address] 11/07/2022, 1:55 PM

## 2022-11-07 NOTE — Progress Notes (Signed)
Pharmacy Antibiotic Note  Russell [name] is a 71 y.o. male admitted on 11/05/2022 with  community acquired pneuomonia .  Pharmacy has been consulted for levofloxacin dosing.  Plan: Levofloxacin IV 750 mg every 24hours   Height: '5\' 10"'$  (177.8 cm) Weight: 90.7 kg (200 lb) IBW/kg (Calculated) : 73  Temp (24hrs), Avg:97.9 F (36.6 C), Min:97.6 F (36.4 C), Max:98.4 F (36.9 C)  Recent Labs  Lab 11/05/22 1611 11/06/22 0600 11/07/22 0503  WBC 5.6 3.6* 6.6  CREATININE 0.91  --  0.98    Estimated Creatinine Clearance: 77.2 mL/min (by C-G formula based on SCr of 0.98 mg/dL).    Allergies  Allergen Reactions   Penicillins Swelling   Shrimp [Shellfish Allergy] Hives   Cephalosporins Hives and Itching   Cisatracurium Other (See Comments)    Other reaction(s): Unknown Other reaction(s): Unknown    Heparin Hives and Swelling    Patient's daughter reports patient had swelling/hives during hospital admission in [place]. They were on several medications at once so she is unsure whether it was VANCOMYCIN that caused this reaction or another concomitant medication.    Rocuronium Other (See Comments)    Other reaction(s): Unknown Other reaction(s): Unknown    Vancomycin Hives and Swelling    Patient's daughter reports patient had swelling/hives during hospital admission in [place]. They were on several medications at once so she is unsure whether it was VANCOMYCIN that caused this reaction or another concomitant medication.    Wound Dressings Rash    Tegaderm     Antimicrobials this admission: ceftriaxone 12/16 >> 12/18 doxycycline 12/16 >> 12/18 Ciprofloxacin 12/18 x 1 pre-op Levofloxacin 12/18 >>  Dose adjustments this admission: N/a  Microbiology results: None  Thank you for allowing pharmacy to be a part of this patient's care.   Glean Salvo, PharmD, BCPS Clinical Pharmacist  11/07/2022 2:20 PM

## 2022-11-08 LAB — CBC
HCT: 26 % — ABNORMAL LOW (ref 39.0–52.0)
Hemoglobin: 8.1 g/dL — ABNORMAL LOW (ref 13.0–17.0)
MCH: 29.2 pg (ref 26.0–34.0)
MCHC: 31.2 g/dL (ref 30.0–36.0)
MCV: 93.9 fL (ref 80.0–100.0)
Platelets: 247 10*3/uL (ref 150–400)
RBC: 2.77 MIL/uL — ABNORMAL LOW (ref 4.22–5.81)
RDW: 13.9 % (ref 11.5–15.5)
WBC: 8 10*3/uL (ref 4.0–10.5)
nRBC: 0 % (ref 0.0–0.2)

## 2022-11-08 LAB — FOLATE: Folate: 8.9 ng/mL (ref >5.9)

## 2022-11-08 LAB — RETICULOCYTES
Immature Retic Fract: 23.2 % — ABNORMAL HIGH (ref 2.3–15.9)
RBC.: 2.81 MIL/uL — ABNORMAL LOW (ref 4.22–5.81)
Retic Count, Absolute: 67.4 10*3/uL (ref 19.0–186.0)
Retic Ct Pct: 2.4 % (ref 0.4–3.1)

## 2022-11-08 LAB — RENAL FUNCTION PANEL
Albumin: 3.1 g/dL — ABNORMAL LOW (ref 3.5–5.0)
Anion gap: 5 (ref 5–15)
BUN: 25 mg/dL — ABNORMAL HIGH (ref 8–23)
CO2: 24 mmol/L (ref 22–32)
Calcium: 8.5 mg/dL — ABNORMAL LOW (ref 8.9–10.3)
Chloride: 112 mmol/L — ABNORMAL HIGH (ref 98–111)
Creatinine, Ser: 0.99 mg/dL (ref 0.61–1.24)
GFR, Estimated: >60 mL/min (ref >60)
Glucose, Bld: 126 mg/dL — ABNORMAL HIGH (ref 70–99)
Phosphorus: 3.8 mg/dL (ref 2.5–4.6)
Potassium: 3.8 mmol/L (ref 3.5–5.1)
Sodium: 141 mmol/L (ref 135–145)

## 2022-11-08 LAB — IRON AND TIBC
Iron: 23 ug/dL — ABNORMAL LOW (ref 45–182)
Saturation Ratios: 7 % — ABNORMAL LOW (ref 17.9–39.5)
TIBC: 319 ug/dL (ref 250–450)
UIBC: 296 ug/dL

## 2022-11-08 LAB — FERRITIN: Ferritin: 47 ng/mL (ref 24–336)

## 2022-11-08 LAB — APTT: aPTT: 50 seconds — ABNORMAL HIGH (ref 24–36)

## 2022-11-08 LAB — MAGNESIUM: Magnesium: 2.3 mg/dL (ref 1.7–2.4)

## 2022-11-08 LAB — VITAMIN B12: Vitamin B-12: 529 pg/mL (ref 180–914)

## 2022-11-08 MED ORDER — MORPHINE SULFATE (PF) 2 MG/ML IV SOLN: 2.0000 mg | INTRAVENOUS | Status: DC | PRN

## 2022-11-08 NOTE — Plan of Care (Addendum)
Patient AOX4, VSS throughout shift.  All meds given on time as ordered.  Pt c/o pain relieved by PRN tylenol. Diminished lungs, IS encouraged.  Pt voided in urinal.  Pulses WDLs.  POC maintained, will continue to monitor.  Problem: Education: Goal: Knowledge of General Education information will improve Description: Including pain rating scale, medication(s)/side effects and non-pharmacologic comfort measures Outcome: Progressing   Problem: Health Behavior/Discharge Planning: Goal: Ability to manage health-related needs will improve Outcome: Progressing   Problem: Clinical Measurements: Goal: Ability to maintain clinical measurements within normal limits will improve Outcome: Progressing Goal: Will remain free from infection Outcome: Progressing Goal: Diagnostic test results will improve Outcome: Progressing Goal: Respiratory complications will improve Outcome: Progressing Goal: Cardiovascular complication will be avoided Outcome: Progressing   Problem: Activity: Goal: Risk for activity intolerance will decrease Outcome: Progressing   Problem: Nutrition: Goal: Adequate nutrition will be maintained Outcome: Progressing   Problem: Coping: Goal: Level of anxiety will decrease Outcome: Progressing   Problem: Elimination: Goal: Will not experience complications related to bowel motility Outcome: Progressing Goal: Will not experience complications related to urinary retention Outcome: Progressing   Problem: Pain Managment: Goal: General experience of comfort will improve Outcome: Progressing   Problem: Safety: Goal: Ability to remain free from injury will improve Outcome: Progressing   Problem: Skin Integrity: Goal: Risk for impaired skin integrity will decrease Outcome: Progressing

## 2022-11-08 NOTE — Consult Note (Signed)
ANTICOAGULATION CONSULT NOTE  Pharmacy Consult for Bivalirudin infusion Indication: DVT  Allergies  Allergen Reactions   Penicillins Swelling   Shrimp [Shellfish Allergy] Hives   Cephalosporins Hives and Itching   Cisatracurium Other (See Comments)    Other reaction(s): Unknown Other reaction(s): Unknown    Heparin Hives and Swelling    Patient's daughter reports patient had swelling/hives during hospital admission in [place]. They were on several medications at once so she is unsure whether it was VANCOMYCIN that caused this reaction or another concomitant medication.    Rocuronium Other (See Comments)    Other reaction(s): Unknown Other reaction(s): Unknown    Vancomycin Hives and Swelling    Patient's daughter reports patient had swelling/hives during hospital admission in [place]. They were on several medications at once so she is unsure whether it was VANCOMYCIN that caused this reaction or another concomitant medication.    Wound Dressings Rash    Tegaderm     Patient Measurements: Height: '5\' 10"'$  (177.8 cm) Weight: 90.7 kg (200 lb) IBW/kg (Calculated) : 73   Vital Signs: Temp: 97.4 F (36.3 C) (12/18 2204) BP: 100/76 (12/18 2204) Pulse Rate: 76 (12/18 2204)  Labs: Recent Labs    11/05/22 1611 11/05/22 1930 11/06/22 0117 11/06/22 0600 11/06/22 1830 11/07/22 0503 11/08/22 0419  HGB 9.7*  --   --  9.4*  --  8.8* 8.1*  HCT 32.3*  --   --  30.5*  --  28.8* 26.0*  PLT 274  --   --  249  --  266 247  APTT 20*  --    < > 58* 52* 53* 50*  LABPROT 14.9  --   --   --   --   --   --   INR 1.2  --   --   --   --   --   --   CREATININE 0.91  --   --   --   --  0.98  --   TROPONINIHS  --  5  --   --   --   --   --    < > = values in this interval not displayed.     Estimated Creatinine Clearance: 77.2 mL/min (by C-G formula based on SCr of 0.98 mg/dL).   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  Last reported dose 12/16 @ 0900  Assessment: 71 yo male with a history of recurrent lower extremity DVTs.  Presented to the ED with worsening swelling and pain in lower extremities.  Patient was recently hospitalized and underwent a thrombectomy.  Had been on Eliquis at home.  Ultrasound confirmed DVTs.  Patient reports allergy to heparin so being started on bivalirudin.  Goal of Therapy:  aPTT 50-85 seconds Monitor platelets by anticoagulation protocol: Yes   12/17 0117 aPTT 61, therapeutic x 1 12/17 0600 aPTT 58, therapeutic x 2 12/17 1830 aPTT 52, therapeutic x 3 12/18 0503 aPTT 53, therapeutic x 4 12/19 0419 aPTT 50, therapeutic X 5   Plan:  -Continue bivalirudin infusion at 0.15 mg/kg/hr (13.5 mg/hr). -Will recheck aPTT 12/20 with am labs -CBC daily  Andie Mungin D 11/08/2022 5:18 AM

## 2022-11-08 NOTE — Progress Notes (Signed)
PROGRESS NOTE    Kort Stettler  CWC:[id number] DOB: 31-Aug-1951 DOA: 11/05/2022 PCP: Norm Parcel, MD  117A/117A-AA  LOS: 3 days   Brief hospital course:   Assessment & Plan: 71 year old Spanish speaking male with PMH of HTN, HL, RA, ILD, LLE DVT in 03/2022 and RLE DVT in 09/2022 s/p thrombectomy and recent hospitalization for recurrent RLE DVT from 12/5-12/12 when he underwent another mechanical thrombectomy and IVC filter placement and discharged to Eliquis.  Patient returns with RLE swelling, weakness and looking pale and readmitted for occlusive RLE DVT.  Family reports good compliance with his starter pack Eliquis after recent discharge.  And is allergic to heparin.  He was started on Bivalirudin drip.  He underwent another thrombectomy with right popliteal vein access with incomplete evacuation of proximal thrombus close to IVC filter which remained occluded.  Plan to go back to OR for another thrombectomy from jugular approach.   Recurrent occlusive RLE DVT and history of LLE DVT: Current likely due to occluded IVC filter versus Eliquis failure.  Patient reports good compliance with Eliquis after his recent discharge from the hospital. -S/p mechanical thrombectomy with right popliteal vein access but incomplete evacuation of thrombus and occluded IVC. Plan: -Continue Bivalirudin drip (allergy to Heparin). -Plan for repeat thrombectomy with jugular vein access on 12/20 -Pain control   Acute COPD exacerbation in the setting of bronchopneumonia: part of his respiratory symptoms could be due to low preload from occluded IVC.  Improved. --cont levaquin --cont prednisone 40 mg daily   Bilateral Lower Lobe bronchoPneumonia:  --seen on CTA chest. History of severe allergy documented as DRESS to penicillin and cephalosporin and carbapenems and vancomycin.  --cont Levaquin   Normocytic anemia:  Iron def H&H seems to be stable but had EBL of 400 cc after mechanical thrombectomy on  12/18. --anemia workup pos for iron def   Acute on Chronic Cough: -Continue antitussive as needed.   History of MRSA infections/thoracic spine osteomyelitis in 2018: -Continue home doxycycline.   GERD:  -Continue Pepcid   Stage 3 Sacral Ulcers: - Consulted wound care.   History of H1N1 Influenza Pneumonia complicated by ARDS s/p ECMO   Lumbar Degenerative Disc Disease / Multiple Orthopedic and Spine surgeries Basal Cell Carcinoma   DVT prophylaxis: On:Bivalirudin gtt Code Status: Full code  Family Communication:  Level of care: Med-Surg Dispo:   The patient is from: home Anticipated d/c is to: home Anticipated d/c date is: possible 12/21   Subjective and Interval History:  Pt reported no change today.   Objective: Vitals:   11/08/22 0836 11/08/22 0838 11/08/22 1601 11/08/22 1634  BP: 106/74  118/86 111/70  Pulse: 79  78 83  Resp: '16  17 18  '$ Temp: 98.9 F (37.2 C)  98.2 F (36.8 C) (!) 97.5 F (36.4 C)  TempSrc:   Oral   SpO2: 97% 97% 96% 96%  Weight:      Height:        Intake/Output Summary (Last 24 hours) at 11/08/2022 1833 Last data filed at 11/08/2022 1604 Gross per 24 hour  Intake 1308.38 ml  Output 3975 ml  Net -2666.62 ml   Filed Weights   11/05/22 1604  Weight: 90.7 kg    Examination:   Constitutional: NAD, AAOx3 HEENT: conjunctivae and lids normal, EOMI CV: No cyanosis.   RESP: normal respiratory effort, on RA Extremities: right leg wrapped SKIN: warm, dry Neuro: II - XII grossly intact.   Psych: Normal mood and affect.  Appropriate judgement and reason   Data Reviewed: I have personally reviewed labs and imaging studies  Time spent: 50 minutes  Enzo Bi, MD [facility] If 7PM-7AM, please contact night-coverage 11/08/2022, 6:33 PM

## 2022-11-08 NOTE — Care Management Important Message (Signed)
Important Message  Patient Details  Name: Russell [name] [name]: [id number] Date of Birth: May 31, 1951   Medicare Important Message Given:  N/A - LOS <3 / Initial given by admissions     [name] 11/08/2022, 7:35 AM

## 2022-11-09 ENCOUNTER — Encounter: Payer: Self-pay | Admitting: Certified Registered"

## 2022-11-09 ENCOUNTER — Encounter
Admission: EM | Disposition: A | Discharge status: Home / Self Care | Payer: Self-pay | Source: Home / Self Care | Attending: Hospitalist

## 2022-11-09 DIAGNOSIS — T82868A Thrombosis of vascular prosthetic devices, implants and grafts, initial encounter: Secondary | ICD-10-CM

## 2022-11-09 DIAGNOSIS — I82411 Acute embolism and thrombosis of right femoral vein: Principal | ICD-10-CM

## 2022-11-09 DIAGNOSIS — I82421 Acute embolism and thrombosis of right iliac vein: Secondary | ICD-10-CM

## 2022-11-09 DIAGNOSIS — I82220 Acute embolism and thrombosis of inferior vena cava: Secondary | ICD-10-CM

## 2022-11-09 HISTORY — PX: IVC FILTER REMOVAL: CATH118246

## 2022-11-09 HISTORY — PX: PERIPHERAL VASCULAR THROMBECTOMY: CATH118306

## 2022-11-09 LAB — BASIC METABOLIC PANEL
Anion gap: 5 (ref 5–15)
BUN: 24 mg/dL — ABNORMAL HIGH (ref 8–23)
CO2: 24 mmol/L (ref 22–32)
Calcium: 8.7 mg/dL — ABNORMAL LOW (ref 8.9–10.3)
Chloride: 113 mmol/L — ABNORMAL HIGH (ref 98–111)
Creatinine, Ser: 0.88 mg/dL (ref 0.61–1.24)
GFR, Estimated: >60 mL/min (ref >60)
Glucose, Bld: 90 mg/dL (ref 70–99)
Potassium: 3.7 mmol/L (ref 3.5–5.1)
Sodium: 142 mmol/L (ref 135–145)

## 2022-11-09 LAB — CBC WITH DIFFERENTIAL/PLATELET
Abs Immature Granulocytes: 0.02 10*3/uL (ref 0.00–0.07)
Basophils Absolute: 0 10*3/uL (ref 0.0–0.1)
Basophils Relative: 0 %
Eosinophils Absolute: 0 10*3/uL (ref 0.0–0.5)
Eosinophils Relative: 0 %
HCT: 27.8 % — ABNORMAL LOW (ref 39.0–52.0)
Hemoglobin: 8.6 g/dL — ABNORMAL LOW (ref 13.0–17.0)
Immature Granulocytes: 0 %
Lymphocytes Relative: 22 %
Lymphs Abs: 1.3 10*3/uL (ref 0.7–4.0)
MCH: 28.9 pg (ref 26.0–34.0)
MCHC: 30.9 g/dL (ref 30.0–36.0)
MCV: 93.3 fL (ref 80.0–100.0)
Monocytes Absolute: 0.8 10*3/uL (ref 0.1–1.0)
Monocytes Relative: 14 %
Neutro Abs: 3.6 10*3/uL (ref 1.7–7.7)
Neutrophils Relative %: 64 %
Platelets: 251 10*3/uL (ref 150–400)
RBC: 2.98 MIL/uL — ABNORMAL LOW (ref 4.22–5.81)
RDW: 13.9 % (ref 11.5–15.5)
WBC: 5.7 10*3/uL (ref 4.0–10.5)
nRBC: 0 % (ref 0.0–0.2)

## 2022-11-09 LAB — APTT
aPTT: 52 seconds — ABNORMAL HIGH (ref 24–36)
aPTT: 55 seconds — ABNORMAL HIGH (ref 24–36)

## 2022-11-09 LAB — CBC
HCT: 27.8 % — ABNORMAL LOW (ref 39.0–52.0)
Hemoglobin: 8.6 g/dL — ABNORMAL LOW (ref 13.0–17.0)
MCH: 29.2 pg (ref 26.0–34.0)
MCHC: 30.9 g/dL (ref 30.0–36.0)
MCV: 94.2 fL (ref 80.0–100.0)
Platelets: 243 10*3/uL (ref 150–400)
RBC: 2.95 MIL/uL — ABNORMAL LOW (ref 4.22–5.81)
RDW: 14 % (ref 11.5–15.5)
WBC: 13.8 10*3/uL — ABNORMAL HIGH (ref 4.0–10.5)
nRBC: 0 % (ref 0.0–0.2)

## 2022-11-09 LAB — MAGNESIUM: Magnesium: 2.3 mg/dL (ref 1.7–2.4)

## 2022-11-09 SURGERY — IVC FILTER REMOVAL
Anesthesia: Moderate Sedation

## 2022-11-09 MED ORDER — BIVALIRUDIN BOLUS VIA INFUSION
INTRAVENOUS | Status: DC | PRN
  Administered 2022-11-09: 68.025 mg via INTRAVENOUS

## 2022-11-09 MED ORDER — FAMOTIDINE 20 MG PO TABS
ORAL_TABLET | ORAL | Status: AC
  Administered 2022-11-09: 20 mg via ORAL
  Filled 2022-11-09: qty 1

## 2022-11-09 MED ORDER — METHYLPREDNISOLONE SODIUM SUCC 125 MG IJ SOLR: 125.0000 mg | Freq: Once | INTRAMUSCULAR | Status: AC | PRN

## 2022-11-09 MED ORDER — FENTANYL CITRATE PF 50 MCG/ML IJ SOSY
PREFILLED_SYRINGE | INTRAMUSCULAR | Status: AC
  Filled 2022-11-09: qty 1

## 2022-11-09 MED ORDER — ALTEPLASE 2 MG IJ SOLR
INTRAMUSCULAR | Status: AC
  Filled 2022-11-09: qty 6

## 2022-11-09 MED ORDER — FENTANYL CITRATE (PF) 100 MCG/2ML IJ SOLN
INTRAMUSCULAR | Status: DC | PRN
  Administered 2022-11-09: 25 ug via INTRAVENOUS
  Administered 2022-11-09: 50 ug via INTRAVENOUS
  Administered 2022-11-09 (×3): 25 ug via INTRAVENOUS

## 2022-11-09 MED ORDER — IODIXANOL 320 MG/ML IV SOLN
INTRAVENOUS | Status: DC | PRN
  Administered 2022-11-09: 160 mL

## 2022-11-09 MED ORDER — CHLORHEXIDINE GLUCONATE 4 % EX LIQD: 60.0000 mL | Freq: Once | CUTANEOUS | Status: DC

## 2022-11-09 MED ORDER — SODIUM CHLORIDE 0.9 % IV SOLN
INTRAVENOUS | Status: DC | PRN
  Administered 2022-11-09: 1.75 mg/kg/h via INTRAVENOUS

## 2022-11-09 MED ORDER — FAMOTIDINE 20 MG PO TABS: 40.0000 mg | ORAL_TABLET | Freq: Once | ORAL | Status: AC | PRN

## 2022-11-09 MED ORDER — MIDAZOLAM HCL 2 MG/ML PO SYRP: 8.0000 mg | ORAL_SOLUTION | Freq: Once | ORAL | Status: DC | PRN

## 2022-11-09 MED ORDER — BIVALIRUDIN TRIFLUOROACETATE 250 MG IV SOLR
INTRAVENOUS | Status: AC
  Filled 2022-11-09: qty 250

## 2022-11-09 MED ORDER — SODIUM CHLORIDE 0.9 % IV SOLN: INTRAVENOUS | Status: DC

## 2022-11-09 MED ORDER — ALTEPLASE 1 MG/ML SYRINGE FOR VASCULAR PROCEDURE
INTRAMUSCULAR | Status: DC | PRN
  Administered 2022-11-09: 6 mg

## 2022-11-09 MED ORDER — MIDAZOLAM HCL 5 MG/5ML IJ SOLN
INTRAMUSCULAR | Status: AC
  Filled 2022-11-09: qty 5

## 2022-11-09 MED ORDER — MIDAZOLAM HCL 2 MG/2ML IJ SOLN
INTRAMUSCULAR | Status: DC | PRN
  Administered 2022-11-09 (×2): 1 mg via INTRAVENOUS
  Administered 2022-11-09: 2 mg via INTRAVENOUS
  Administered 2022-11-09: 1 mg via INTRAVENOUS

## 2022-11-09 MED ORDER — METHYLPREDNISOLONE SODIUM SUCC 125 MG IJ SOLR
INTRAMUSCULAR | Status: AC
  Administered 2022-11-09: 125 mg via INTRAVENOUS
  Filled 2022-11-09: qty 2

## 2022-11-09 MED ORDER — DIPHENHYDRAMINE HCL 50 MG/ML IJ SOLN: 50.0000 mg | Freq: Once | INTRAMUSCULAR | Status: AC | PRN

## 2022-11-09 MED ORDER — CIPROFLOXACIN IN D5W 400 MG/200ML IV SOLN
400.0000 mg | INTRAVENOUS | Status: AC
  Administered 2022-11-09: 400 mg via INTRAVENOUS
  Filled 2022-11-09: qty 200

## 2022-11-09 MED ORDER — MIDAZOLAM HCL 2 MG/2ML IJ SOLN
INTRAMUSCULAR | Status: AC
  Filled 2022-11-09: qty 2

## 2022-11-09 MED ORDER — DIPHENHYDRAMINE HCL 50 MG/ML IJ SOLN
INTRAMUSCULAR | Status: AC
  Administered 2022-11-09: 50 mg via INTRAVENOUS
  Filled 2022-11-09: qty 1

## 2022-11-09 MED ORDER — FENTANYL CITRATE (PF) 100 MCG/2ML IJ SOLN
INTRAMUSCULAR | Status: AC
  Filled 2022-11-09: qty 2

## 2022-11-09 MED ORDER — CIPROFLOXACIN IN D5W 400 MG/200ML IV SOLN
INTRAVENOUS | Status: DC | PRN
  Administered 2022-11-09: 400 mg via INTRAVENOUS

## 2022-11-09 SURGICAL SUPPLY — 22 items
BALLN ATG 14X6X80 (BALLOONS) ×1
BALLN DORADO 10X80X80 (BALLOONS) ×1
BALLOON ATG 14X6X80 (BALLOONS) IMPLANT
BALLOON DORADO 10X80X80 (BALLOONS) IMPLANT
CANISTER PENUMBRA ENGINE (MISCELLANEOUS) IMPLANT
CATH BEACON 5 .035 65 KMP TIP (CATHETERS) IMPLANT
CATH LIGHTNI FLASH 16XTORQ 100 (CATHETERS) IMPLANT
CATH LIGHTNING FLASH XTORQ 100 (CATHETERS) ×1
CLOSURE PERCLOSE PROSTYLE (VASCULAR PRODUCTS) IMPLANT
COVER DRAPE FLUORO 36X44 (DRAPES) IMPLANT
COVER PROBE ULTRASOUND 5X96 (MISCELLANEOUS) IMPLANT
DRYSEAL FLEXSHEATH 16FR 33CM (SHEATH) ×1
GLIDEWIRE ADV .035X260CM (WIRE) IMPLANT
GLIDEWIRE ANGLED SS 035X260CM (WIRE) IMPLANT
KIT ENCORE 26 ADVANTAGE (KITS) IMPLANT
PACK ANGIOGRAPHY (CUSTOM PROCEDURE TRAY) ×1 IMPLANT
SET CLOVERSNARE FLT RETRIEVAL (MISCELLANEOUS) IMPLANT
SHEATH BRITE TIP 6FRX11 (SHEATH) IMPLANT
SHEATH DRYSEAL FLEX 16FR 33CM (SHEATH) IMPLANT
STENT VENOVO 20X60X80 (Permanent Stent) IMPLANT
SUT MNCRL AB 4-0 PS2 18 (SUTURE) IMPLANT
WIRE GUIDERIGHT .035X150 (WIRE) IMPLANT

## 2022-11-09 NOTE — Interval H&P Note (Signed)
History and Physical Interval Note:  11/09/2022 10:24 AM  Russell [name]  has presented today for surgery, with the diagnosis of Thrombosis.  The various methods of treatment have been discussed with the patient and family. After consideration of risks, benefits and other options for treatment, the patient has consented to  Procedure(s) with comments: IVC FILTER REMOVAL (N/A) PERIPHERAL VASCULAR THROMBECTOMY (N/A) - Inferior Vena Cava Thrombectomy as a surgical intervention.  The patient's history has been reviewed, patient examined, no change in status, stable for surgery.  I have reviewed the patient's chart and labs.  Questions were answered to the patient's satisfaction.     [name]

## 2022-11-09 NOTE — Progress Notes (Signed)
PROGRESS NOTE    Russell [name]  AST:[id number] DOB: 1951/03/31 DOA: 11/05/2022 PCP: Norm Parcel, MD  117A/117A-AA  LOS: 4 days   Brief hospital course:   Assessment & Plan: 71 year old Spanish speaking male with PMH of HTN, HL, RA, ILD, LLE DVT in 03/2022 and RLE DVT in 09/2022 s/p thrombectomy and recent hospitalization for recurrent RLE DVT from 12/5-12/12 when he underwent another mechanical thrombectomy and IVC filter placement and discharged to Eliquis.  Patient returns with RLE swelling, weakness and looking pale and readmitted for occlusive RLE DVT.  Family reports good compliance with his starter pack Eliquis after recent discharge.  And is allergic to heparin.  He was started on Bivalirudin drip.  He underwent another thrombectomy with right popliteal vein access with incomplete evacuation of proximal thrombus close to IVC filter which remained occluded.  Plan to go back to OR for another thrombectomy from jugular approach.   Recurrent occlusive RLE DVT and history of LLE DVT:  Current likely due to occluded IVC filter versus Eliquis failure.  Patient reports good compliance with Eliquis after his recent discharge from the hospital. -S/p mechanical thrombectomy with right popliteal vein access but incomplete evacuation of thrombus and occluded IVC. Plan: --repeat thrombectomy today -Continue Bivalirudin drip (allergy to Heparin). --transition to Xarelto tomorrow, per vascular   Acute COPD exacerbation in the setting of bronchopneumonia: part of his respiratory symptoms could be due to low preload from occluded IVC.  Improved. --cont Levaquin --cont prednisone 40 mg daily  Bilateral Lower Lobe bronchoPneumonia:  --seen on CTA chest. History of severe allergy documented as DRESS to penicillin and cephalosporin and carbapenems and vancomycin.  --cont Levaquin   Normocytic anemia:  Iron def H&H seems to be stable but had EBL of 400 cc after mechanical thrombectomy on  12/18. --anemia workup pos for iron def --plan to discharge with iron supplement   Acute on Chronic Cough: -Continue antitussive as needed.   History of MRSA infections/thoracic spine osteomyelitis in 2018: -Continue home doxycycline.   GERD:  -Continue Pepcid   Stage 3 Sacral Ulcers: - Consulted wound care.   History of H1N1 Influenza Pneumonia complicated by ARDS s/p ECMO   Lumbar Degenerative Disc Disease / Multiple Orthopedic and Spine surgeries Basal Cell Carcinoma   DVT prophylaxis: On:Bivalirudin gtt Code Status: Full code  Family Communication:  Level of care: Med-Surg Dispo:   The patient is from: home Anticipated d/c is to: home Anticipated d/c date is: possible 12/21   Subjective and Interval History:  Went for another angiogram and intervention today.   Objective: Vitals:   11/09/22 1400 11/09/22 1415 11/09/22 1430 11/09/22 1520  BP: '94/69 91/71 97/71 '$ 103/74  Pulse: 68 (!) 59 62 68  Resp: '17 14 18 18  '$ Temp:      TempSrc:      SpO2: 97% 95% 97% 98%  Weight:      Height:        Intake/Output Summary (Last 24 hours) at 11/09/2022 1654 Last data filed at 11/09/2022 1600 Gross per 24 hour  Intake 581.25 ml  Output 2425 ml  Net -1843.75 ml   Filed Weights   11/05/22 1604  Weight: 90.7 kg    Examination:   Constitutional: NAD, sedated under anesthesia CV: No cyanosis.   RESP: normal respiratory effort, on RA SKIN: warm, dry   Data Reviewed: I have personally reviewed labs and imaging studies  Time spent: 35 minutes  Enzo Bi, MD [facility] If 7PM-7AM, please  contact night-coverage 11/09/2022, 4:54 PM

## 2022-11-09 NOTE — Op Note (Signed)
[facility]   OPERATIVE NOTE   PRE-OPERATIVE DIAGNOSIS: extensive right leg and IVC DVT, occluded IVC filter  POST-OPERATIVE DIAGNOSIS: same   PROCEDURE: 1.   US guidance for vascular access to right jugular vein and right femoral vein 2.   Catheter placement into IVC from right jugular approach and right femoral approach 3.   IVC gram and right lower extremity venogram 4.   Catheter directed thrombolysis with IVC and right iliac veins with 6 mg of tpa  5.   Mechanical thrombectomy to the right iliac veins and IVC with the Penumbra Cat 16 device 6.   PTA of IVC  with 10 mm balloon 7.   Stent placement to the IVC with 20 mm x 6 cm Venovo stent    SURGEON: [name], MD  ASSISTANT(S): none  ANESTHESIA: local with moderate conscious sedation for 134 minutes using 5 mg of Versed and 150 mcg of Fentanyl  ESTIMATED BLOOD LOSS: 500 cc  FINDING(S): 1.  The IVC was occluded with the filter at the top of the IVC above the filter in the suprarenal vena cava was patent.  The right iliac vein was also thrombosed as was the right femoral vein.  SPECIMEN(S):  none  INDICATIONS:    Patient is a 71 y.o. male who presents with an occluded IVC with recurrent thrombosis of the right lower extremity and IVC after multiple previous thrombectomy attempts.  He has a previously placed IVC filter which is occluded, we are bringing him back for thrombectomy and attempt to remove this IVC filter..  Patient has marked leg swelling and pain.  Venous intervention is performed to reduce the symtpoms and avoid long term postphlebitic symptoms.    DESCRIPTION: After obtaining full informed written consent, the patient was brought back to the vascular suite and placed supine upon the table. Moderate conscious sedation was administered during a face to face encounter with the patient throughout the procedure with my supervision of the RN administering medicines and monitoring the patient's  vital signs, pulse oximetry, telemetry and mental status throughout from the start of the procedure until the patient was taken to the recovery room.  After obtaining adequate anesthesia, the patient was prepped and draped in the standard fashion.    The patient was then placed into the prone position.  I started by accessing the right jugular vein under direct ultrasound guidance.  This was accessed without difficulty and a permanent image was recorded.  The delivery sheath was then placed over the J-wire and we exchanged for an advantage wire and advanced the delivery sheath down into the suprarenal vena cava.  Imaging was performed which showed the suprarenal vena cava to be patent without thrombus.  There was a clear narrowing to what appeared to be an occlusion on the left side of the IVC at the level of the IVC filter.  The top of the IVC filter appeared to be intraluminal.  I then elected to try to snare the filter.  The snare was able to grasp the hook on the filter without difficulty and I then brought the delivery and retrieval sheath down to the top of the filter but did not remove the filter before performing thrombectomy from below.   The right femoral vein was then accessed under direct ultrasound guidance without difficulty with a micropuncture needle and a permanent image was recorded.  I placed a 6 French sheath and a J-wire.  A ProGlide device was then placed in  a preclose fashion.  I then upsized to an 16 Fr sheath over a J wire.  A bolus and a drip of Angiomax were used.  Imaging showed extensive DVT with minimal flow from the femoral vein through the iliac vein and up into the infrarenal IVC.  I then advanced the penumbra CAT 16 catheter up into the iliac vein and IVC.  I then used the penumbra CAT 16 catheter and instilled 6 mg of tpa throughout the IVC and iliac veins.  After this dwelled, I used the Penumbra Cat 16 catheter and evacuated about 250 cc of effluent with mechanical  thrombectomy throughout the iliac veins and IVC.  When I had debulked the thrombus in and below the IVC filter, I then remove the IVC filter although on removal it was clear that the filter had been fractured previously prior to retrieval attempt and there were 2 stents struts that appeared to be out of the caval wall.  The remainder of the filter was removed.  We kept our access from above.  Further thrombectomy was performed from below to try to remove any thrombus in the IVC and iliac veins, but due to the continued occlusion this rethrombosed quickly.  Another pass with the penumbra CAT 16 device was performed in the IVC and iliac veins and a tremendously large amount of thrombus was removed throughout.  When we had debulk the thrombus is much as possible, we crossed wires from the femoral access to the jugular access to ensure worry where luminal from top to bottom.  From the femoral approach, a 10 mm diameter by 8 cm length high-pressure angioplasty balloon was inflated in the location where the IVC filter had been removed and the cava was occluded.  A very tight waist was seen but this resolved at about 6 to 8 atm.  I was then able to perform injections for both top and bottom and there remained a near occlusive narrowing of the IVC at this location.  This was just below the level of the renal veins.  I then elected to use a large stent in the IVC to try to keep this area open to avoid rethrombosis.  This would also gaol any loose particles from the fractured stent that had been removed.  A 20 mm diameter by 6 cm length Venovo stent was then deployed.  The most superior portion was at the bottom of the renal veins and the inferior portion was in the mid to distal inferior vena cava.  This was postdilated with a 14 mm balloon with a good angiographic completion result and only about a 20% residual stenosis and now with flow through the cava.  I then elected to terminate the procedure.  The sheath was removed  and a dressing was placed at the jugular incision.  The large sheath was removed from the femoral incision and a second Pro-glide device was deployed and hemostasis achieved with 2 ProGlide devices in the right femoral vein.  The wire was removed and pressure was held at the site and a sterile dressing was placed.  He was taken to the recovery room in stable condition having tolerated the procedure well.    COMPLICATIONS: None  CONDITION: Stable  [name] 11/09/2022 1:41 PM

## 2022-11-09 NOTE — Consult Note (Signed)
ANTICOAGULATION CONSULT NOTE  Pharmacy Consult for Bivalirudin infusion Indication: DVT  Allergies  Allergen Reactions   Penicillins Swelling   Shrimp [Shellfish Allergy] Hives   Cephalosporins Hives and Itching   Cisatracurium Other (See Comments)    Other reaction(s): Unknown Other reaction(s): Unknown    Heparin Hives and Swelling    Patient's daughter reports patient had swelling/hives during hospital admission in [place]. They were on several medications at once so she is unsure whether it was VANCOMYCIN that caused this reaction or another concomitant medication.    Rocuronium Other (See Comments)    Other reaction(s): Unknown Other reaction(s): Unknown    Vancomycin Hives and Swelling    Patient's daughter reports patient had swelling/hives during hospital admission in [place]. They were on several medications at once so she is unsure whether it was VANCOMYCIN that caused this reaction or another concomitant medication.    Wound Dressings Rash    Tegaderm     Patient Measurements: Height: '5\' 10"'$  (177.8 cm) Weight: 90.7 kg (200 lb) IBW/kg (Calculated) : 73   Vital Signs: Temp: 97.5 F (36.4 C) (12/20 2036) Temp Source: Oral (12/20 1026) BP: 92/63 (12/20 2036) Pulse Rate: 81 (12/20 2036)  Labs: Recent Labs    11/07/22 0503 11/08/22 0419 11/09/22 0528 11/09/22 1512 11/09/22 1923  HGB 8.8* 8.1* 8.6* 8.6*  --   HCT 28.8* 26.0* 27.8* 27.8*  --   PLT 266 247 251 243  --   APTT 53* 50* 52*  --  55*  CREATININE 0.98 0.99 0.88  --   --      Estimated Creatinine Clearance: 86 mL/min (by C-G formula based on SCr of 0.88 mg/dL).   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  Last reported dose 12/16 @ 0900  Assessment: 71 yo male with a history of recurrent lower extremity DVTs.  Presented to  the ED with worsening swelling and pain in lower extremities.  Patient was recently hospitalized and underwent a thrombectomy.  Had been on Eliquis at home.  Ultrasound confirmed DVTs.  Patient reports allergy to heparin so being started on bivalirudin.  Goal of Therapy:  aPTT 50-85 seconds Monitor platelets by anticoagulation protocol: Yes   12/17 0117 aPTT 61, therapeutic X 1 12/17 0600 aPTT 58, therapeutic X 2 12/17 1830 aPTT 52, therapeutic X 3 12/18 0503 aPTT 53, therapeutic X 4 12/19 0419 aPTT 50, therapeutic X 5  12/20 0528 aPTT 52, therapeutic X 6 12/20 1923 aPTT 55, therapeutic X 7  Plan:  -Continue bivalirudin infusion at 0.15 mg/kg/hr (13.5 mg/hr). -Will recheck aPTT 12/21 with am labs -CBC daily  Pearla Dubonnet 11/09/2022 8:47 PM

## 2022-11-09 NOTE — Consult Note (Signed)
ANTICOAGULATION CONSULT NOTE  Pharmacy Consult for Bivalirudin infusion Indication: DVT  Allergies  Allergen Reactions   Penicillins Swelling   Shrimp [Shellfish Allergy] Hives   Cephalosporins Hives and Itching   Cisatracurium Other (See Comments)    Other reaction(s): Unknown Other reaction(s): Unknown    Heparin Hives and Swelling    Patient's daughter reports patient had swelling/hives during hospital admission in [place]. They were on several medications at once so she is unsure whether it was VANCOMYCIN that caused this reaction or another concomitant medication.    Rocuronium Other (See Comments)    Other reaction(s): Unknown Other reaction(s): Unknown    Vancomycin Hives and Swelling    Patient's daughter reports patient had swelling/hives during hospital admission in [place]. They were on several medications at once so she is unsure whether it was VANCOMYCIN that caused this reaction or another concomitant medication.    Wound Dressings Rash    Tegaderm     Patient Measurements: Height: '5\' 10"'$  (177.8 cm) Weight: 90.7 kg (200 lb) IBW/kg (Calculated) : 73   Vital Signs: Temp: 98.2 F (36.8 C) (12/20 0513) Temp Source: Oral (12/20 0513) BP: 94/65 (12/20 0513) Pulse Rate: 63 (12/20 0513)  Labs: Recent Labs    11/07/22 0503 11/08/22 0419 11/09/22 0528  HGB 8.8* 8.1* 8.6*  HCT 28.8* 26.0* 27.8*  PLT 266 247 251  APTT 53* 50* 52*  CREATININE 0.98 0.99  --      Estimated Creatinine Clearance: 76.4 mL/min (by C-G formula based on SCr of 0.99 mg/dL).   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  Last reported dose 12/16 @ 0900  Assessment: 71 yo male with a history of recurrent lower extremity DVTs.  Presented to the ED with worsening swelling and pain in lower extremities.  Patient was  recently hospitalized and underwent a thrombectomy.  Had been on Eliquis at home.  Ultrasound confirmed DVTs.  Patient reports allergy to heparin so being started on bivalirudin.  Goal of Therapy:  aPTT 50-85 seconds Monitor platelets by anticoagulation protocol: Yes   12/17 0117 aPTT 61, therapeutic x 1 12/17 0600 aPTT 58, therapeutic x 2 12/17 1830 aPTT 52, therapeutic x 3 12/18 0503 aPTT 53, therapeutic x 4 12/19 0419 aPTT 50, therapeutic X 5  12/20 0528 aPTT 52, therapeutic X 6  Plan:  -Continue bivalirudin infusion at 0.15 mg/kg/hr (13.5 mg/hr). -Will recheck aPTT 12/21 with am labs -CBC daily  Deissy Guilbert D 11/09/2022 6:15 AM

## 2022-11-10 ENCOUNTER — Telehealth (HOSPITAL_COMMUNITY): Payer: Self-pay

## 2022-11-10 ENCOUNTER — Other Ambulatory Visit (HOSPITAL_COMMUNITY): Payer: Self-pay

## 2022-11-10 ENCOUNTER — Encounter: Payer: Self-pay | Admitting: Vascular Surgery

## 2022-11-10 LAB — APTT: aPTT: 53 seconds — ABNORMAL HIGH (ref 24–36)

## 2022-11-10 LAB — CBC
HCT: 24.1 % — ABNORMAL LOW (ref 39.0–52.0)
Hemoglobin: 7.5 g/dL — ABNORMAL LOW (ref 13.0–17.0)
MCH: 28.7 pg (ref 26.0–34.0)
MCHC: 31.1 g/dL (ref 30.0–36.0)
MCV: 92.3 fL (ref 80.0–100.0)
Platelets: 228 10*3/uL (ref 150–400)
RBC: 2.61 MIL/uL — ABNORMAL LOW (ref 4.22–5.81)
RDW: 13.7 % (ref 11.5–15.5)
WBC: 9 10*3/uL (ref 4.0–10.5)
nRBC: 0 % (ref 0.0–0.2)

## 2022-11-10 LAB — BASIC METABOLIC PANEL
Anion gap: 7 (ref 5–15)
BUN: 26 mg/dL — ABNORMAL HIGH (ref 8–23)
CO2: 21 mmol/L — ABNORMAL LOW (ref 22–32)
Calcium: 8.3 mg/dL — ABNORMAL LOW (ref 8.9–10.3)
Chloride: 113 mmol/L — ABNORMAL HIGH (ref 98–111)
Creatinine, Ser: 0.88 mg/dL (ref 0.61–1.24)
GFR, Estimated: >60 mL/min (ref >60)
Glucose, Bld: 126 mg/dL — ABNORMAL HIGH (ref 70–99)
Potassium: 4 mmol/L (ref 3.5–5.1)
Sodium: 141 mmol/L (ref 135–145)

## 2022-11-10 LAB — MAGNESIUM: Magnesium: 2.3 mg/dL (ref 1.7–2.4)

## 2022-11-10 MED ORDER — SODIUM CHLORIDE 0.9 % IV SOLN
200.0000 mg | Freq: Every day | INTRAVENOUS | Status: DC
  Filled 2022-11-10: qty 10

## 2022-11-10 MED ORDER — APIXABAN 5 MG PO TABS: 5.0000 mg | ORAL_TABLET | Freq: Two times a day (BID) | ORAL | Status: DC

## 2022-11-10 MED ORDER — APIXABAN 5 MG PO TABS: 10.0000 mg | ORAL_TABLET | Freq: Two times a day (BID) | ORAL | 0 refills | Status: DC

## 2022-11-10 MED ORDER — IRON (FERROUS SULFATE) 325 (65 FE) MG PO TABS: 1.0000 | ORAL_TABLET | Freq: Every day | ORAL | Status: DC

## 2022-11-10 MED ORDER — APIXABAN 5 MG PO TABS
10.0000 mg | ORAL_TABLET | Freq: Two times a day (BID) | ORAL | Status: DC
  Administered 2022-11-10: 10 mg via ORAL
  Filled 2022-11-10: qty 2

## 2022-11-10 MED ORDER — SODIUM CHLORIDE 0.9 % IV SOLN
200.0000 mg | Freq: Every day | INTRAVENOUS | Status: DC
  Administered 2022-11-10: 200 mg via INTRAVENOUS
  Filled 2022-11-10: qty 200

## 2022-11-10 MED ORDER — APIXABAN 5 MG PO TABS: 5.0000 mg | ORAL_TABLET | Freq: Two times a day (BID) | ORAL | 11 refills | Status: DC

## 2022-11-10 NOTE — TOC Benefit Eligibility Note (Signed)
Patient Teacher, English as a foreign language completed.    The patient is currently admitted and upon discharge could be taking Eliquis Starter Pack.  The current 30 day co-pay is $0.00.   The patient is insured through [employer], [place] Patient Advocate Specialist [place] Patient Advocate Team Direct Number: [phone] Fax: [phone]

## 2022-11-10 NOTE — Telephone Encounter (Signed)
Pharmacy Patient Advocate Encounter  Insurance verification completed.    The patient is insured through [employer]    The patient is currently admitted and ran test claims for the following: Eliquis Starter Pack.  Copays and coinsurance results were relayed to Inpatient clinical team.

## 2022-11-10 NOTE — Discharge Summary (Signed)
Physician Discharge Summary   Russell [name]  male DOB: Jan 24, 1951  PIR:[id number]  PCP: Norm Parcel, MD  Admit date: 11/05/2022 Discharge date: 11/10/2022  Admitted From: home Disposition:  home Daughter updated on the phone prior to discharge. Home Health: Yes CODE STATUS: Full code  Discharge Instructions     Discharge instructions   Complete by: As directed    Your IVC filter has been removed, and blood clots removed by vascular surgeon.  Please resume taking Eliquis starting with 10 mg twice daily for 7 days, and then on 11/17/22, switch to Eliquis 5 mg twice daily.  You need to take this indefinitely.  Please follow up with vascular surgeon Dr. Lucky Cowboy in 3 weeks.  You are deficient in iron and anemic.  You have received 1 dose of IV iron.  Please take oral iron supplement, any over-the-counter form is ok.   Dr. Enzo Bi - -   No wound care   Complete by: As directed       Hospital Course:  For full details, please see H&P, progress notes, consult notes and ancillary notes.  Briefly,  Russell [name] is a 71 year old Spanish speaking male with PMH of HTN, RA, ILD, LLE DVT in 03/2022 and RLE DVT in 09/2022 s/p thrombectomy and recent hospitalization for recurrent RLE DVT from 12/5-12/12 when he underwent another mechanical thrombectomy and IVC filter placement and discharged on Eliquis.   Patient returned with RLE swelling, weakness and looking pale and readmitted for occlusive RLE DVT.  Family reported good compliance with his starter pack Eliquis after recent discharge.  pt is allergic to heparin.  He was started on Bivalirudin drip.     Recurrent occlusive RLE DVT and history of LLE DVT:  --S/p mechanical thrombectomy on 11/07/22 with incomplete evacuation of thrombus and occluded IVC. --s/p 2nd mech thrombectomy with IVC filter removal and stent placement on 11/09/22 --Hematology was consulted, and Dr. Janese Banks believed recurrent DVT's are likely due to occluded IVC  filter, and not Eliquis failure, so after IVC filter was removed, pt was recommended to resume Eliquis. --pt discharged on loading dose of Eliquis 10 mg BID f/b 5 mg BID indefinitely. --f/u with vascular Dr. Lucky Cowboy in 3 weeks.  Acute COPD exacerbation in the setting of bilateral lower lobe bronchopneumonia:  --seen on CTA chest. --received 1 dose of ceftriaxone f/b 3 doses of Levaquin --completed a course of steroid burst.   Normocytic anemia:  Iron def --anemia workup pos for iron def --pt received IV iron x2, and discharged on oral iron supplement.   History of MRSA infections/thoracic spine osteomyelitis in 2018: -Continue home doxycycline.   Stage 3 Sacral Ulcers, ruled out --per wound care consult, previous Stage 3 pressure injury, the injured area has now closed and scar tissue is present. No wound.    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 The score is based on dignosis, age, lab data, medications, orders, and past utilization.   Low:  0-14.9   Medium: 15-21.9   High: 22-29.9   Extreme: 30 and above         Discharge Instructions:  Allergies as of 11/10/2022       Reactions   Penicillins Swelling   Shrimp [shellfish Allergy] Hives   Cephalosporins Hives, Itching   Cisatracurium Other (See Comments)   Other reaction(s): Unknown Other  reaction(s): Unknown   Heparin Hives, Swelling   Patient's daughter reports patient had swelling/hives during hospital admission in [place]. They were on several medications at once so she is unsure whether it was VANCOMYCIN that caused this reaction or another concomitant medication.    Rocuronium Other (See Comments)   Other reaction(s): Unknown Other reaction(s): Unknown   Vancomycin Hives, Swelling   Patient's daughter reports patient had swelling/hives during hospital admission in [place]. They were on several medications at once so she is unsure whether it was VANCOMYCIN that caused this reaction or another concomitant medication.    Wound Dressings Rash   Tegaderm        Medication List     STOP taking these medications    methylPREDNISolone 4 MG Tbpk tablet Commonly known as: MEDROL DOSEPAK   potassium chloride SA 20 MEQ tablet Commonly known as: KLOR-CON M       TAKE these medications    apixaban 5 MG Tabs tablet Commonly known as: ELIQUIS Take 2 tablets (10 mg total) by mouth 2 (two) times daily for 7 days. What changed: Another medication with the same name was changed. Make sure you understand how and when to take each.   apixaban 5 MG Tabs tablet Commonly known as: ELIQUIS Take 1 tablet (5 mg total) by mouth 2 (two) times daily. Start taking on: November 17, 2022 What changed: These instructions start on November 17, 2022. If you are unsure what to do until then, ask your doctor or other care provider.   doxycycline 100 MG capsule Commonly known as: VIBRAMYCIN Take 100 mg by mouth daily.   gabapentin 300 MG capsule Commonly known as: NEURONTIN Take 300 mg by mouth 3 (three) times daily.   Iron (Ferrous Sulfate) 325 (65 Fe) MG Tabs Take 1 tablet by mouth daily at 12 noon. Can take any over-the-counter form of iron supplement.   lidocaine 5 % ointment Commonly known as: XYLOCAINE Apply 1 Application topically 3 (three) times daily as needed for mild pain.          Follow-up Information     Dew, Erskine Squibb, MD Follow up in 3 week(s).   Specialties: Vascular Surgery, Radiology, Interventional Cardiology Contact information: [place] [address] [phone]         Norm Parcel, MD Follow up in 1 week(s).   Specialty: Family Medicine Contact information: [place] [address] [phone]                 Allergies  Allergen Reactions   Penicillins Swelling   Shrimp [Shellfish Allergy] Hives   Cephalosporins Hives and Itching   Cisatracurium Other (See Comments)    Other reaction(s): Unknown Other reaction(s): Unknown    Heparin Hives and Swelling    Patient's daughter reports patient had swelling/hives during hospital admission in [place]. They were on several medications at once so she is unsure whether it was VANCOMYCIN that  caused this reaction or another concomitant medication.    Rocuronium Other (See Comments)    Other reaction(s): Unknown Other reaction(s): Unknown    Vancomycin Hives and Swelling    Patient's daughter reports patient had swelling/hives during hospital admission in [place]. They were on several medications at once so she is unsure whether it was VANCOMYCIN that caused this reaction or another concomitant medication.    Wound Dressings Rash    Tegaderm      The results of significant diagnostics from this hospitalization (including imaging, microbiology, ancillary and laboratory) are listed below for reference.   Consultations:   Procedures/Studies: PERIPHERAL VASCULAR CATHETERIZATION  Result Date: 11/09/2022 See surgical note for result.  CT VENOGRAM ABD/PELVIS/LOWER EXT BILAT  Result Date: 11/07/2022 CLINICAL DATA:  71 year old male with a history of DVT EXAM: CT VENOGRAM ABDOMEN AND PELVIS AND LOWER EXTREMITY BILATERAL TECHNIQUE: Axial sequential CT images of the abdomen pelvis were performed after  administration of standard IV contrast bolus. Timing was acquired with attention to the venous structures. Reformatted images in coronal and sagittal planes were performed on a separate workstation. RADIATION DOSE REDUCTION: This exam was performed according to the departmental dose-optimization program which includes automated exposure control, adjustment of the mA and/or kV according to patient size and/or use of iterative reconstruction technique. CONTRAST:  139m OMNIPAQUE IOHEXOL 350 MG/ML SOLN COMPARISON:  05/01/2020 FINDINGS: VASCULAR Arterial: Note that the evaluation of the arterial structures is limited secondary to the timing of the contrast bolus, optimized for venous structures per the protocol. Aorta: Atherosclerotic changes of the abdominal aorta. No pedunculated plaque or ulcerated plaque. Diameter of the infrarenal abdominal aorta estimated 19 mm. Celiac: Patent without significant calcified plaque. SMA: Patent without significant calcified plaque. Renals: - Right: Single right renal artery without significant calcified plaque at the origin. - Left: Single left renal artery without significant calcified plaque at the origin. IMA: Inferior mesenteric artery is patent. Right lower extremity: Tortuosity of the right iliac system. Hypogastric artery patent. Minimal calcified atherosclerotic plaque. Common femoral artery patent. Proximal profunda femoris and SFA patent. Left lower extremity: Tortuosity of the left iliac system. Mild atherosclerotic plaque. Hypogastric arteries patent. Common femoral artery patent. Proximal profunda femoris and SFA patent. Venous: Systemic: IVC: Hepatic IVC unremarkable. IVC filter in place, which appears on prior plain film to be a retrievable [place]. The tines penetrate the wall of the IVC. Anterior time penetrates the second portion of the duodenum. Posterior tine appears imbedded within an osteophyte of the lumbar vertebral body. DVT extends from the IVC filter  inferiorly through the lower IVC to the confluence of the iliac veins. Iliac veins: Occlusive DVT extends through both of the iliac system, from the IVC through the common and external iliac veins. Veins are expanded without calcification. Femoral veins: Central filling defect of the right common femoral vein. The most proximal femoral vein and profunda vein appear to have relatively normal contrast opacification. Central filling defect of the left common femoral vein. There appears to be filling defect within the most proximal imaged left femoral vein. Contrast opacifies the profunda system relatively within normal limits. Renal veins are patent Portal system: Hepatic veins patent. Left and right portal veins patent. Main portal vein patent. Splenic vein patent. SMV and IMV patent. Review of the MIP images confirms the above findings. NON-VASCULAR Lower chest: Architectural distortion of the bilateral lower lungs compatible with nonspecific fibrosis. No confluent airspace disease. Respiratory motion somewhat limits evaluation. Bronchiectasis. Hepatobiliary: Unremarkable liver.  Unremarkable gallbladder. Pancreas:  Unremarkable Spleen: Unremarkable. Adrenals/Urinary Tract: - Right adrenal gland: Unremarkable - Left adrenal gland: Unremarkable. - Right kidney: No hydronephrosis, nephrolithiasis, inflammation, or ureteral dilation. Focal cortical thinning of the lateral cortex of the lower pole right kidney. Simple cyst lateral cortex of the right kidney requiring no further follow-up. - Left Kidney: No hydronephrosis, nephrolithiasis, inflammation, or ureteral dilation. No focal lesion. - Urinary Bladder: Unremarkable. Stomach/Bowel: - Stomach: Hiatal hernia.  Otherwise unremarkable stomach. - Small bowel: Unremarkable - Appendix: Normal. - Colon: Diverticular disease without evidence of acute diverticulitis. Surgical anastomosis in the sigmoid colon of prior resection. Lymphatic: No adenopathy. Mesenteric: No free  fluid or air. No mesenteric adenopathy. Reproductive: Unremarkable appearance of the pelvic organs. Other: Eventration of the midline abdomen, potentially surgical reconstruction of a ventral hernia versus laxity of the postoperative midline rectus abdominus. Musculoskeletal: Bridging Heterotopic ossification about the left hip. Surgical changes of prior left hip fixation. No aggressive lytic or sclerotic lesions identified. No acute displaced fracture. Surgical changes of thoracolumbar fixation, incompletely imaged. IMPRESSION: Acute ilio caval and ilio femoral DVT, extending from a retrievable IVC filter inferiorly through the bilateral common femoral veins. CT is negative for DVT extension above the apex of the filter. The anterior tines appear to penetrate the duodenum, and there is a posterior tine embedded within anterior osteophyte of the lumbar spine. Aortic Atherosclerosis (ICD10-I70.0). Additional ancillary findings as above. Signed, Dulcy Fanny. Nadene Rubins, RPVI [facility] Electronically Signed   By: Corrie Mckusick D.O.   On: 11/07/2022 10:02   PERIPHERAL VASCULAR CATHETERIZATION  Result Date: 11/07/2022 See surgical note for result.  VAS [place] LOWER EXTREMITY VENOUS (DVT)  Result Date: 11/07/2022  Lower Venous DVT Study Patient Name:  Russell [name]  Date of Exam:   11/04/2022 Medical Rec #: [id number]       Accession #:    [id number] Date of Birth: Jun 01, 1951       Patient Gender: M Patient Age:   34 years Exam Location:  [facility] Procedure:      VAS [place] LOWER EXTREMITY VENOUS (DVT) Referring Phys: [name] --------------------------------------------------------------------------------  Indications: Swelling, Pain, and Hx of RT LE DVT.  Risk Factors: DVT 10/25/2022: Rt LE DVT Surgery 10/07/2022: IVC Gram and Right Lower Extremity Venogram. Catheter thrombolysis with 8 mg of TPA. Mechanical Thrombectomy of the Right  SFV,CFV EIV, CIV and IVC with the penumbra 16 device. 10/27/2022: IVC gram and Right Lower Extremity Venogram. Catheter directed thrombolysis with 8 mg of TPA to the Iliac and CFV. Mechanical Thrombectomy to the Right CFV, Iliac Veins and IVC with the Penumbra 16 lightening device. PTA of the CFV and Distal EIV with 12 mm balloon. Comparison Study: 10/25/2022 Performing Technologist: Almira Coaster RVS  Examination Guidelines: A complete evaluation includes B-mode imaging, spectral Doppler, color Doppler, and power Doppler as needed of all accessible portions of each vessel. Bilateral testing is considered an integral part of a complete examination. Limited examinations for reoccurring indications may be performed as noted. The reflux portion of the exam is performed with the patient in reverse Trendelenburg.  +---------+---------------+---------+-----------+----------+--------------+ RIGHT    CompressibilityPhasicitySpontaneityPropertiesThrombus Aging +---------+---------------+---------+-----------+----------+--------------+ CFV      None           No       No                                  +---------+---------------+---------+-----------+----------+--------------+  SFJ      None           Yes      Yes                                 +---------+---------------+---------+-----------+----------+--------------+ FV Prox  Partial        Yes      Yes                                 +---------+---------------+---------+-----------+----------+--------------+ FV Mid   Partial        Yes      Yes                                 +---------+---------------+---------+-----------+----------+--------------+ FV DistalFull           No       No                                  +---------+---------------+---------+-----------+----------+--------------+ PFV      None           No       No                                   +---------+---------------+---------+-----------+----------+--------------+ POP      Partial        Yes      Yes                                 +---------+---------------+---------+-----------+----------+--------------+ PTV      Full           Yes      Yes                                 +---------+---------------+---------+-----------+----------+--------------+ PERO     Full           Yes      Yes                                 +---------+---------------+---------+-----------+----------+--------------+ SSV      Full           Yes      Yes                                 +---------+---------------+---------+-----------+----------+--------------+   +----+---------------+---------+-----------+----------+--------------+ LEFTCompressibilityPhasicitySpontaneityPropertiesThrombus Aging +----+---------------+---------+-----------+----------+--------------+ CFV Full           Yes      Yes                                 +----+---------------+---------+-----------+----------+--------------+     Summary: RIGHT: - Findings consistent with acute deep vein thrombosis involving the right common femoral vein. - Findings consistent with chronic deep vein thrombosis involving the right common femoral vein, SF junction, right femoral vein, right popliteal vein, and right  proximal profunda vein.  LEFT: - No evidence of common femoral vein obstruction.  *See table(s) above for measurements and observations. Electronically signed by [name] MD on 11/07/2022 at 8:54:37 AM.    Final    CT Angio Chest PE W and/or Wo Contrast  Result Date: 11/05/2022 CLINICAL DATA:  Right lower extremity swelling. Pulmonary embolism suspected. High probability. History of thoracic spine fusion surgery. EXAM: CT ANGIOGRAPHY CHEST WITH CONTRAST TECHNIQUE: Multidetector CT imaging of the chest was performed using the standard protocol during bolus administration of intravenous contrast. Multiplanar CT image  reconstructions and MIPs were obtained to evaluate the vascular anatomy. RADIATION DOSE REDUCTION: This exam was performed according to the departmental dose-optimization program which includes automated exposure control, adjustment of the mA and/or kV according to patient size and/or use of iterative reconstruction technique. CONTRAST:  146m OMNIPAQUE IOHEXOL 350 MG/ML SOLN COMPARISON:  Most recent chest films PA Lat 10/25/2022, with prior CTA chest 10/06/2022, prior chest CT no contrast 06/11/2022. FINDINGS: Cardiovascular: Largest moderately enlarged with left chamber predominance. There is no pericardial effusion. There is prominence of the superior pulmonary veins. The pulmonary trunk slightly prominent 3.3 cm, unchanged, indicating arterial hypertension but without visible arterial embolism. There is calcification in the superior mitral ring, scattered calcification LAD coronary artery. The great vessels and aorta are tortuous. There are mild scattered calcific plaques. There is no aortic or great vessel aneurysm, stenosis or dissection. Mediastinum/Nodes: Scattered bilateral borderline prominent hilar lymph nodes are unchanged in the interval. Additional right and left paratracheal lymph nodes up to 8 mm in short axis, subcarinal nodes up to 10 mm short axis are unaltered as well. No new adenopathy. Small hiatal hernia. The thoracic esophagus, trachea and main bronchi are unremarkable. No thyroid mass. Lungs/Pleura: Diffuse bronchial thickening has increased, especially in the lower lobes. There is patchy increased opacity over portions of both lower lobes consistent with likely bronchopneumonia, specifically in the right lower lobe superior segment and in the posteromedial basal left lower lobe. Findings are superimposed on chronic interstitial lung disease with subpleural reticulation and ground-glass opacities predominating in the bases and with honeycombing in both basal lateral areas. There are  bilateral trace pleural effusions. There does not appear to be superimposed interstitial edema at this time. Upper Abdomen: No ascites or mesenteric congestion. No acute findings. Mild hepatic steatosis. Cholelithiasis without evidence of cholecystitis. Musculoskeletal: Spray artifact arises from again noted dorsal fusion rod construct C7-T11, with T3-5 laminectomies, central corpectomies and cylinder graft imbedded hardware with mature fusion. Moderate thoracic kyphosis is unchanged. The ribcage is intact, with multiple healed fracture deformities posteriorly. There is multilevel thoracic spine bridging enthesopathy, noted from T3 down. Review of the MIP images confirms the above findings. IMPRESSION: 1. No evidence of arterial embolus. Slightly prominent pulmonary trunk indicating arterial hypertension. 2. Cardiomegaly with prominence of the superior pulmonary veins but no superimposed interstitial edema. 3. Aortic and coronary artery atherosclerosis. 4. Chronic interstitial lung disease with honeycombing in the lateral bases. 5. Bilateral lower lobe bronchopneumonia with increased bronchial thickening especially in the lower lobes. 6. Trace pleural effusions. 7. Stable borderline prominent mediastinal and hilar lymph nodes. 8. Cholelithiasis without evidence of cholecystitis. 9. Hepatic steatosis. 10. Small hiatal hernia. 11. Postsurgical and degenerative changes of the spine with chronic thoracic kyphosis. Electronically Signed   By: KTelford NabM.D.   On: 11/05/2022 21:55   [place] Img Lower Unilateral Right  Result Date: 11/05/2022 CLINICAL DATA:  History of DVT. Recent thrombectomy. Increased groin  pain. EXAM: RIGHT LOWER EXTREMITY VENOUS DOPPLER ULTRASOUND TECHNIQUE: Gray-scale sonography with compression, as well as color and duplex ultrasound, were performed to evaluate the deep venous system(s) from the level of the common femoral vein through the popliteal and proximal calf veins. COMPARISON:   Right lower extremity venous Doppler ultrasound 10/25/2022 FINDINGS: VENOUS There is occlusive thrombus within the common femoral vein. There is nonocclusive thrombus within the profunda femoral vein and femoral vein proximally, but normal flow distally. There is normal flow and compressibility throughout the popliteal vein and saphenofemoral junction. There is also nonocclusive thrombus within the posterior tibial vein. The peroneal vein is not visualized. Limited views of the contralateral common femoral vein were unable to be obtained secondary to patient pain. OTHER None. Limitations: none IMPRESSION: 1. Occlusive thrombus within the right common femoral vein. 2. Nonocclusive thrombus within the profunda femoral vein and femoral vein proximally. 3. Nonocclusive thrombus within the posterior tibial vein. Electronically Signed   By: Ronney Asters M.D.   On: 11/05/2022 17:27   ECHOCARDIOGRAM COMPLETE  Result Date: 10/29/2022    ECHOCARDIOGRAM REPORT   Patient Name:   Russell [name] Date of Exam: 10/29/2022 Medical Rec #:  [id number]      Height:       70.0 in Accession #:    [id number]     Weight:       213.4 lb Date of Birth:  12/08/1950      BSA:          2.146 m Patient Age:    67 years       BP:           102/66 mmHg Patient Gender: M              HR:           78 bpm. Exam Location:  [facility] Procedure: 2D Echo and Strain Analysis Indications:     Shock  History:         Patient has prior history of Echocardiogram examinations, most                  recent 04/07/2022.  Sonographer:     Kathlen Brunswick RDCS Referring Phys:  [id number] Bradly Bienenstock Diagnosing Phys: Ida Rogue MD  Sonographer Comments: Global longitudinal strain was attempted. IMPRESSIONS  1. Left ventricular ejection fraction, by estimation, is 60 to 65%. The left ventricle has normal function. The left ventricle has no regional wall motion abnormalities. There is moderate left ventricular hypertrophy. Left ventricular diastolic parameters  are consistent with Grade I diastolic dysfunction (impaired relaxation). The average left ventricular global longitudinal strain is -23.8 %.  2. Right ventricular systolic function is normal. The right ventricular size is normal. Tricuspid regurgitation signal is inadequate for assessing PA pressure.  3. Left atrial size was mildly dilated.  4. The mitral valve is normal in structure. Mild to moderate mitral valve regurgitation. No evidence of mitral stenosis.  5. The aortic valve is normal in structure. Aortic valve regurgitation is not visualized. Aortic valve sclerosis is present, with no evidence of aortic valve stenosis.  6. There is mild dilatation of the aortic root, measuring 40 mm.  7. The inferior vena cava is normal in size with greater than 50% respiratory variability, suggesting right atrial pressure of 3 mmHg. FINDINGS  Left Ventricle: Left ventricular ejection fraction, by estimation, is 60 to 65%. The left ventricle has normal function. The left ventricle has no regional wall motion abnormalities. The average  left ventricular global longitudinal strain is -23.8 %. The left ventricular internal cavity size was normal in size. There is moderate left ventricular hypertrophy. Left ventricular diastolic parameters are consistent with Grade I diastolic dysfunction (impaired relaxation). Right Ventricle: The right ventricular size is normal. No increase in right ventricular wall thickness. Right ventricular systolic function is normal. Tricuspid regurgitation signal is inadequate for assessing PA pressure. Left Atrium: Left atrial size was mildly dilated. Right Atrium: Right atrial size was normal in size. Pericardium: There is no evidence of pericardial effusion. Mitral Valve: The mitral valve is normal in structure. Mild to moderate mitral valve regurgitation. No evidence of mitral valve stenosis. Tricuspid Valve: The tricuspid valve is normal in structure. Tricuspid valve regurgitation is mild . No  evidence of tricuspid stenosis. Aortic Valve: The aortic valve is normal in structure. Aortic valve regurgitation is not visualized. Aortic valve sclerosis is present, with no evidence of aortic valve stenosis. Aortic valve mean gradient measures 7.0 mmHg. Aortic valve peak gradient measures 14.1 mmHg. Aortic valve area, by VTI measures 2.89 cm. Pulmonic Valve: The pulmonic valve was normal in structure. Pulmonic valve regurgitation is not visualized. No evidence of pulmonic stenosis. Aorta: The aortic root is normal in size and structure. There is mild dilatation of the aortic root, measuring 40 mm. Venous: The inferior vena cava is normal in size with greater than 50% respiratory variability, suggesting right atrial pressure of 3 mmHg. IAS/Shunts: No atrial level shunt detected by color flow Doppler.  LEFT VENTRICLE PLAX 2D LVIDd:         4.20 cm     Diastology LVIDs:         2.90 cm     LV e' medial:    8.81 cm/s LV PW:         1.50 cm     LV E/e' medial:  9.2 LV IVS:        1.40 cm     LV e' lateral:   11.00 cm/s LVOT diam:     2.20 cm     LV E/e' lateral: 7.4 LV SV:         103 LV SV Index:   48          2D Longitudinal Strain LVOT Area:     3.80 cm    2D Strain GLS Avg:     -23.8 %  LV Volumes (MOD) LV vol d, MOD A2C: 77.1 ml LV vol d, MOD A4C: 85.9 ml LV vol s, MOD A2C: 34.0 ml LV vol s, MOD A4C: 27.6 ml LV SV MOD A2C:     43.1 ml LV SV MOD A4C:     85.9 ml LV SV MOD BP:      52.8 ml RIGHT VENTRICLE RV Basal diam:  2.50 cm RV S prime:     14.90 cm/s TAPSE (M-mode): 1.6 cm LEFT ATRIUM             Index        RIGHT ATRIUM           Index LA diam:        4.40 cm 2.05 cm/m   RA Area:     15.50 cm LA Vol (A2C):   61.1 ml 28.48 ml/m  RA Volume:   30.80 ml  14.35 ml/m LA Vol (A4C):   79.0 ml 36.82 ml/m LA Biplane Vol: 73.8 ml 34.39 ml/m  AORTIC VALVE  PULMONIC VALVE AV Area (Vmax):    2.47 cm      PV Vmax:       1.06 m/s AV Area (Vmean):   2.61 cm      PV Peak grad:  4.5 mmHg AV Area  (VTI):     2.89 cm AV Vmax:           188.00 cm/s AV Vmean:          120.000 cm/s AV VTI:            0.355 m AV Peak Grad:      14.1 mmHg AV Mean Grad:      7.0 mmHg LVOT Vmax:         122.00 cm/s LVOT Vmean:        82.400 cm/s LVOT VTI:          0.270 m LVOT/AV VTI ratio: 0.76  AORTA Ao Root diam: 4.00 cm Ao Asc diam:  3.10 cm MITRAL VALVE MV Area (PHT): 3.54 cm     SHUNTS MV Decel Time: 214 msec     Systemic VTI:  0.27 m MV E velocity: 81.20 cm/s   Systemic Diam: 2.20 cm MV A velocity: 121.00 cm/s MV E/A ratio:  0.67 Ida Rogue MD Electronically signed by Ida Rogue MD Signature Date/Time: 10/29/2022/12:23:32 PM    Final    PERIPHERAL VASCULAR CATHETERIZATION  Result Date: 10/27/2022 See surgical note for result.  US Venous Img Lower Unilateral Right  Result Date: 10/25/2022 CLINICAL DATA:  Pain and swelling EXAM: Right LOWER EXTREMITY VENOUS DOPPLER ULTRASOUND TECHNIQUE: Gray-scale sonography with compression, as well as color and duplex ultrasound, were performed to evaluate the deep venous system(s) from the level of the common femoral vein through the popliteal and proximal calf veins. COMPARISON:  None Available. FINDINGS: VENOUS There is evidence of occlusive deep venous thrombosis in right common femoral vein. Nonocclusive DVT is noted in the proximal femoral vein. Rest of the major deep veins appear patent. There is nonocclusive superficial phlebitis in greater saphenous vein. Limited views of the contralateral common femoral vein are unremarkable. OTHER None. Limitations: none IMPRESSION: Occlusive DVT is seen in right common femoral vein. Nonocclusive DVT is noted in proximal course of right femoral vein. Superficial phlebitis is noted in the right greater saphenous vein. Electronically Signed   By: Elmer Picker M.D.   On: 10/25/2022 13:33   DG Chest 2 View  Result Date: 10/25/2022 CLINICAL DATA:  Cough and shortness of breath.  Leg swelling. EXAM: CHEST - 2 VIEW COMPARISON:   06/11/2022 FINDINGS: Thoracic spine fixation, suboptimally evaluated. Upper thoracic vertebral corpectomy. IVC filter. Patient rotated to the right. Mild cardiomegaly. No pleural effusion or pneumothorax. Peripheral and basilar predominant interstitial thickening likely corresponds to interstitial lung disease on 10/06/2022 CT. No superimposed congestive failure or lobar consolidation. IMPRESSION: Interstitial lung disease, grossly similar to the prior CTA chest. Consider pulmonology consultation for eventual dedicated high-resolution chest CT. Cardiomegaly without superimposed congestive failure or acute process. Electronically Signed   By: Abigail Miyamoto M.D.   On: 10/25/2022 12:26      Labs: BNP (last 3 results) Recent Labs    10/25/22 1144  BNP 16.1   Basic Metabolic Panel: Recent Labs  Lab 11/05/22 1611 11/07/22 0503 11/08/22 0419 11/09/22 0528 11/10/22 0522  NA 139 142 141 142 141  K 4.5 4.0 3.8 3.7 4.0  CL 109 113* 112* 113* 113*  CO2 '22 22 24 24 '$ 21*  GLUCOSE 109* 113* 126*  90 126*  BUN 15 19 25* 24* 26*  CREATININE 0.91 0.98 0.99 0.88 0.88  CALCIUM 8.9 8.9 8.5* 8.7* 8.3*  MG  --   --  2.3 2.3 2.3  PHOS  --   --  3.8  --   --    Liver Function Tests: Recent Labs  Lab 11/05/22 1611 11/08/22 0419  AST 15  --   ALT 15  --   ALKPHOS 74  --   BILITOT 1.0  --   PROT 7.1  --   ALBUMIN 4.0 3.1*   No results for input(s): "LIPASE", "AMYLASE" in the last 168 hours. No results for input(s): "AMMONIA" in the last 168 hours. CBC: Recent Labs  Lab 11/05/22 1611 11/06/22 0600 11/07/22 0503 11/08/22 0419 11/09/22 0528 11/09/22 1512 11/10/22 0522  WBC 5.6   < > 6.6 8.0 5.7 13.8* 9.0  NEUTROABS 3.2  --  4.8  --  3.6  --   --   HGB 9.7*   < > 8.8* 8.1* 8.6* 8.6* 7.5*  HCT 32.3*   < > 28.8* 26.0* 27.8* 27.8* 24.1*  MCV 95.6   < > 93.8 93.9 93.3 94.2 92.3  PLT 274   < > 266 247 251 243 228   < > = values in this interval not displayed.   Cardiac Enzymes: No results for  input(s): "CKTOTAL", "CKMB", "CKMBINDEX", "TROPONINI" in the last 168 hours. BNP: Invalid input(s): "POCBNP" CBG: No results for input(s): "GLUCAP" in the last 168 hours. D-Dimer No results for input(s): "DDIMER" in the last 72 hours. Hgb A1c No results for input(s): "HGBA1C" in the last 72 hours. Lipid Profile No results for input(s): "CHOL", "HDL", "LDLCALC", "TRIG", "CHOLHDL", "LDLDIRECT" in the last 72 hours. Thyroid function studies No results for input(s): "TSH", "T4TOTAL", "T3FREE", "THYROIDAB" in the last 72 hours.  Invalid input(s): "FREET3" Anemia work up Recent Labs    11/08/22 0419  VITAMINB12 529  FOLATE 8.9  FERRITIN 47  TIBC 319  IRON 23*  RETICCTPCT 2.4   Urinalysis    Component Value Date/Time   COLORURINE STRAW (A) 10/28/2022 1124   APPEARANCEUR CLEAR (A) 10/28/2022 1124   APPEARANCEUR Hazy (A) 03/10/2020 1151   LABSPEC 1.006 10/28/2022 1124   PHURINE 6.0 10/28/2022 1124   GLUCOSEU NEGATIVE 10/28/2022 1124   HGBUR SMALL (A) 10/28/2022 1124   BILIRUBINUR NEGATIVE 10/28/2022 1124   BILIRUBINUR Negative 03/10/2020 1151   KETONESUR NEGATIVE 10/28/2022 1124   PROTEINUR NEGATIVE 10/28/2022 1124   NITRITE NEGATIVE 10/28/2022 1124   LEUKOCYTESUR NEGATIVE 10/28/2022 1124   Sepsis Labs Recent Labs  Lab 11/08/22 0419 11/09/22 0528 11/09/22 1512 11/10/22 0522  WBC 8.0 5.7 13.8* 9.0   Microbiology No results found for this or any previous visit (from the past 240 hour(s)).   Total time spend on discharging this patient, including the last patient exam, discussing the hospital stay, instructions for ongoing care as it relates to all pertinent caregivers, as well as preparing the medical discharge records, prescriptions, and/or referrals as applicable, is 45 minutes.    Enzo Bi, MD  [facility] 11/10/2022, 1:03 PM

## 2022-11-10 NOTE — Progress Notes (Signed)
Patient reports not willing to use public transportation to home location following discharge.  "I'm going to wait to my daughter."

## 2022-11-10 NOTE — Consult Note (Signed)
ANTICOAGULATION CONSULT NOTE  Pharmacy Consult for Bivalirudin infusion Indication: DVT  Allergies  Allergen Reactions   Penicillins Swelling   Shrimp [Shellfish Allergy] Hives   Cephalosporins Hives and Itching   Cisatracurium Other (See Comments)    Other reaction(s): Unknown Other reaction(s): Unknown    Heparin Hives and Swelling    Patient's daughter reports patient had swelling/hives during hospital admission in [place]. They were on several medications at once so she is unsure whether it was VANCOMYCIN that caused this reaction or another concomitant medication.    Rocuronium Other (See Comments)    Other reaction(s): Unknown Other reaction(s): Unknown    Vancomycin Hives and Swelling    Patient's daughter reports patient had swelling/hives during hospital admission in [place]. They were on several medications at once so she is unsure whether it was VANCOMYCIN that caused this reaction or another concomitant medication.    Wound Dressings Rash    Tegaderm     Patient Measurements: Height: '5\' 10"'$  (177.8 cm) Weight: 90.7 kg (200 lb) IBW/kg (Calculated) : 73   Vital Signs: Temp: 97.3 F (36.3 C) (12/21 0535) BP: 93/62 (12/21 0602) Pulse Rate: 76 (12/21 0602)  Labs: Recent Labs    11/08/22 0419 11/09/22 0528 11/09/22 1512 11/09/22 1923 11/10/22 0522  HGB 8.1* 8.6* 8.6*  --  7.5*  HCT 26.0* 27.8* 27.8*  --  24.1*  PLT 247 251 243  --  228  APTT 50* 52*  --  55* 53*  CREATININE 0.99 0.88  --   --   --      Estimated Creatinine Clearance: 86 mL/min (by C-G formula based on SCr of 0.88 mg/dL).   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  Last reported dose 12/16 @ 0900  Assessment: 71 yo male with a history of recurrent lower extremity DVTs.  Presented to the ED with worsening swelling and  pain in lower extremities.  Patient was recently hospitalized and underwent a thrombectomy.  Had been on Eliquis at home.  Ultrasound confirmed DVTs.  Patient reports allergy to heparin so being started on bivalirudin.  Goal of Therapy:  aPTT 50-85 seconds Monitor platelets by anticoagulation protocol: Yes   12/17 0117 aPTT 61, therapeutic X 1 12/17 0600 aPTT 58, therapeutic X 2 12/17 1830 aPTT 52, therapeutic X 3 12/18 0503 aPTT 53, therapeutic X 4 12/19 0419 aPTT 50, therapeutic X 5  12/20 0528 aPTT 52, therapeutic X 6 12/20 1923 aPTT 55, therapeutic X 7 12/21 0522 aPTT 53, therapeutic X 8   Plan:  -Continue bivalirudin infusion at 0.15 mg/kg/hr (13.5 mg/hr). -Will recheck aPTT 12/22 with am labs -CBC daily  Jaydeen Darley D 11/10/2022 6:33 AM

## 2022-11-10 NOTE — Care Management Important Message (Signed)
Important Message  Patient Details  Name: Russell [name] [name]: [id number] Date of Birth: 05/19/1951   Medicare Important Message Given:  Yes     Juliann Pulse A Ulysess Witz 11/10/2022, 1:52 PM

## 2022-11-10 NOTE — TOC CM/SW Note (Signed)
  Transition of Care [facility]) Screening Note   Patient Details  Name: Russell [name] Date of Birth: 1951/09/05   Transition of Care [facility]) CM/SW Contact:    Colen Darling, [place] Phone Number: 11/10/2022, 1:20 PM    Transition of Care Department [facility]) has reviewed patient and no TOC needs have been identified at this time. We will continue to monitor patient advancement through interdisciplinary progression rounds. If new patient transition needs arise, please place a TOC consult.

## 2022-11-10 NOTE — Consult Note (Signed)
ANTICOAGULATION CONSULT NOTE  Pharmacy Consult for Bivalirudin infusion Indication: DVT  Allergies  Allergen Reactions   Penicillins Swelling   Shrimp [Shellfish Allergy] Hives   Cephalosporins Hives and Itching   Cisatracurium Other (See Comments)    Other reaction(s): Unknown Other reaction(s): Unknown    Heparin Hives and Swelling    Patient's daughter reports patient had swelling/hives during hospital admission in [place]. They were on several medications at once so she is unsure whether it was VANCOMYCIN that caused this reaction or another concomitant medication.    Rocuronium Other (See Comments)    Other reaction(s): Unknown Other reaction(s): Unknown    Vancomycin Hives and Swelling    Patient's daughter reports patient had swelling/hives during hospital admission in [place]. They were on several medications at once so she is unsure whether it was VANCOMYCIN that caused this reaction or another concomitant medication.    Wound Dressings Rash    Tegaderm     Patient Measurements: Height: '5\' 10"'$  (177.8 cm) Weight: 90.7 kg (200 lb) IBW/kg (Calculated) : 73   Vital Signs: Temp: 97.7 F (36.5 C) (12/21 0811) Temp Source: Oral (12/21 0811) BP: 102/73 (12/21 0811) Pulse Rate: 72 (12/21 0811)  Labs: Recent Labs    11/08/22 0419 11/09/22 0528 11/09/22 1512 11/09/22 1923 11/10/22 0522  HGB 8.1* 8.6* 8.6*  --  7.5*  HCT 26.0* 27.8* 27.8*  --  24.1*  PLT 247 251 243  --  228  APTT 50* 52*  --  55* 53*  CREATININE 0.99 0.88  --   --  0.88     Estimated Creatinine Clearance: 86 mL/min (by C-G formula based on SCr of 0.88 mg/dL).   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  Last reported dose 12/16 @ 0900 - non-compliance, thus not an Eliquis failure.  Assessment: 71 yo male with a history of  recurrent lower extremity DVTs.  Presented to the ED with worsening swelling and pain in lower extremities.  Patient was recently hospitalized and underwent a thrombectomy.  Had been on Eliquis at home.  Ultrasound confirmed DVTs.  Patient reports allergy to heparin so being started on bivalirudin.  Goal of Therapy:  aPTT 50-85 seconds Monitor platelets by anticoagulation protocol: Yes   Plan:  -Give apixaban 10 mg at time of bivalirudin discontinuation -Apixaban 10 mg BID x 7 days followed by 5 mg BID thereafter. -Will recheck aPTT 12/22 with am labs -CBC daily    Glean Salvo, PharmD, BCPS Clinical Pharmacist  11/10/2022 10:48 AM

## 2022-11-10 NOTE — Progress Notes (Signed)
[facility]  Daily Progress Note   Subjective  - POD #1 s/p IVC removal with IVC thrombectomy   Patient is resting comfortably in bed this morning. He has no complaints to note. Patient states his right leg feels much better. Not as swollen today. No difficulties at either insertion site. Right groin C/D/I and right chest is C/D/I. Discussed the case with the patients daughter on the phone this morning. Answered all her questions. She was informed the primary team will determine the date of discharge.    Objective Vitals:   11/09/22 2036 11/10/22 0535 11/10/22 0602 11/10/22 0811  BP: 92/63 (!) 89/62 93/62 102/73  Pulse: 81 80 76 72  Resp: '16 16  16  '$ Temp: (!) 97.5 F (36.4 C) (!) 97.3 F (36.3 C)  97.7 F (36.5 C)  TempSrc:    Oral  SpO2: 97% 96%  97%  Weight:      Height:        Intake/Output Summary (Last 24 hours) at 11/10/2022 1143 Last data filed at 11/10/2022 9179 Gross per 24 hour  Intake 581.25 ml  Output 2825 ml  Net -2243.75 ml    PULM  CTAB CV  RRR VASC  Right lower extremity less swollen today. Leg is warm to the touch and pulses are palpable. No complications to note. Right groin insertion site C/D/I and Right neck insertion site C/D/I. No complications to note.  NEURO  No Headaches light headedness or dizzyness. No TIA symptoms or stroke symptoms.   Laboratory CBC    Component Value Date/Time   WBC 9.0 11/10/2022 0522   HGB 7.5 (L) 11/10/2022 0522   HGB 14.9 10/14/2019 1424   HCT 24.1 (L) 11/10/2022 0522   HCT 45.9 10/14/2019 1424   PLT 228 11/10/2022 0522   PLT 208 10/14/2019 1424    BMET    Component Value Date/Time   NA 141 11/10/2022 0522   NA 143 10/14/2019 1424   K 4.0 11/10/2022 0522   CL 113 (H) 11/10/2022 0522   CO2 21 (L) 11/10/2022 0522   GLUCOSE 126 (H) 11/10/2022 0522   BUN 26 (H) 11/10/2022 0522   BUN 16 10/14/2019 1424   CREATININE 0.88 11/10/2022 0522   CALCIUM 8.3 (L) 11/10/2022 0522   GFRNONAA >60  11/10/2022 0522   GFRAA >60 05/03/2020 0604    Assessment/Planning: POD #1 s/p IVC removal with IVC thrombectomy   IVC Filter Removal With IVC Thrombectomy:   Patient is recovering as expected. No complications to note. Pulses are palpable to patients right leg and swelling has decreased . Right groin insertion site is without complications. Dressing is C/D/I. Catheter insertion site in the patients right neck is without complications. Dressing remains C/D/I. Patients heparin drip was stopped and the patient was restarted on Eliquis by mouth.  Patient to be discharged by primary team when appropriate. Vascular surgery agrees with the plan.    Patient discharged on Eliquis and will return to clinic in 3-4 weeks for a follow up.   [name]  11/10/2022, 11:43 AM

## 2022-11-14 ENCOUNTER — Other Ambulatory Visit: Payer: Self-pay

## 2022-11-14 ENCOUNTER — Encounter: Payer: Self-pay | Admitting: Emergency Medicine

## 2022-11-14 ENCOUNTER — Emergency Department
Admission: EM | Admit: 2022-11-14 | Discharge: 2022-11-14 | Disposition: A | Discharge status: Home / Self Care | Payer: Medicare Other | Attending: Emergency Medicine | Admitting: Emergency Medicine

## 2022-11-14 DIAGNOSIS — R21 Rash and other nonspecific skin eruption: Secondary | ICD-10-CM | POA: Diagnosis present

## 2022-11-14 DIAGNOSIS — I10 Essential (primary) hypertension: Secondary | ICD-10-CM | POA: Insufficient documentation

## 2022-11-14 LAB — CBC WITH DIFFERENTIAL/PLATELET
Abs Immature Granulocytes: 0.03 10*3/uL (ref 0.00–0.07)
Basophils Absolute: 0 10*3/uL (ref 0.0–0.1)
Basophils Relative: 0 %
Eosinophils Absolute: 0.3 10*3/uL (ref 0.0–0.5)
Eosinophils Relative: 5 %
HCT: 26.1 % — ABNORMAL LOW (ref 39.0–52.0)
Hemoglobin: 7.8 g/dL — ABNORMAL LOW (ref 13.0–17.0)
Immature Granulocytes: 1 %
Lymphocytes Relative: 21 %
Lymphs Abs: 1.2 10*3/uL (ref 0.7–4.0)
MCH: 28.6 pg (ref 26.0–34.0)
MCHC: 29.9 g/dL — ABNORMAL LOW (ref 30.0–36.0)
MCV: 95.6 fL (ref 80.0–100.0)
Monocytes Absolute: 0.8 10*3/uL (ref 0.1–1.0)
Monocytes Relative: 13 %
Neutro Abs: 3.6 10*3/uL (ref 1.7–7.7)
Neutrophils Relative %: 60 %
Platelets: 239 10*3/uL (ref 150–400)
RBC: 2.73 MIL/uL — ABNORMAL LOW (ref 4.22–5.81)
RDW: 14.8 % (ref 11.5–15.5)
WBC: 6 10*3/uL (ref 4.0–10.5)
nRBC: 0 % (ref 0.0–0.2)

## 2022-11-14 LAB — COMPREHENSIVE METABOLIC PANEL
ALT: 13 U/L (ref 0–44)
AST: 13 U/L — ABNORMAL LOW (ref 15–41)
Albumin: 3.6 g/dL (ref 3.5–5.0)
Alkaline Phosphatase: 66 U/L (ref 38–126)
Anion gap: 7 (ref 5–15)
BUN: 27 mg/dL — ABNORMAL HIGH (ref 8–23)
CO2: 22 mmol/L (ref 22–32)
Calcium: 8.5 mg/dL — ABNORMAL LOW (ref 8.9–10.3)
Chloride: 111 mmol/L (ref 98–111)
Creatinine, Ser: 0.93 mg/dL (ref 0.61–1.24)
GFR, Estimated: >60 mL/min (ref >60)
Glucose, Bld: 99 mg/dL (ref 70–99)
Potassium: 3.9 mmol/L (ref 3.5–5.1)
Sodium: 140 mmol/L (ref 135–145)
Total Bilirubin: 0.8 mg/dL (ref 0.3–1.2)
Total Protein: 6.2 g/dL — ABNORMAL LOW (ref 6.5–8.1)

## 2022-11-14 MED ORDER — SODIUM CHLORIDE 0.9 % IV BOLUS: 500.0000 mL | Freq: Once | INTRAVENOUS | Status: DC

## 2022-11-14 MED ORDER — AVEENO ACTIVE NATURALS DAILY EX LOTN: 1.0000 | TOPICAL_LOTION | Freq: Two times a day (BID) | CUTANEOUS | 0 refills | Status: DC

## 2022-11-14 MED ORDER — CETIRIZINE HCL 10 MG PO TABS
5.0000 mg | ORAL_TABLET | Freq: Once | ORAL | Status: AC
  Administered 2022-11-14: 5 mg via ORAL
  Filled 2022-11-14: qty 1

## 2022-11-14 NOTE — Discharge Instructions (Signed)
Please follow-up with your doctor this week.  Please return if your rash worsens or if you develop trouble breathing or any other concerns.  It was a pleasure caring for you today.

## 2022-11-14 NOTE — ED Provider Notes (Signed)
[facility] Provider Note    Event Date/Time   First MD Initiated Contact with Patient 11/14/22 1126     (approximate)   History   Rash (/)   HPI  Russell [name] is a 71 y.o. male with a past medical history of hypertension, rheumatoid arthritis, ILD, recurrent DVT status post thrombectomy and recent hospitalization for recurrent right lower extremity DVT for another mechanical thrombectomy and IVC filter placement and subsequent removal, discharged on 11/10/2022 on Eliquis who presents today for evaluation of rash that has been ongoing for approximately 2 weeks.  Patient reports that the rash is very itchy.  He denies trouble breathing or swallowing.  Has not had any tongue or lip swelling.  He is unsure of any new exposures.  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   Peripheral polyneuropathy 02/10/2021   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   Primary osteoarthritis of left knee 01/06/2021   Diverticulitis of colon with perforation 05/03/2020   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   Carpal joint sprain 09/11/2019   Cervical spondylosis without myelopathy 09/11/2019   DDD (degenerative disc disease),  cervical 09/11/2019   Lumbosacral stenosis 08/30/2019   Degeneration of lumbosacral intervertebral disc 08/30/2019   Unilateral vocal cord paralysis 08/03/2018   Hypotension 02/10/2018   Acute blood loss anemia 02/10/2018   S/P spinal fusion 01/17/2018   Other specified abnormal immunological findings in serum 11/27/2017   Postoperative or surgical complication 07/04/4817   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[place]) 11/25/2017   Fluid collection at surgical site 11/25/2017   Delirium due to another medical condition 10/05/2017   BMI 26.0-26.9,adult 06/26/2017   GERD (gastroesophageal reflux disease) 05/09/2017   Chronic cough 11/21/2016   Heterotopic ossification 03/01/2016   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 pylori) infection 02/11/2014   Dyspepsia 02/05/2014   Neck pain 10/11/2013   Pain in joint, pelvic region and thigh 10/11/2013   Low back pain 10/11/2013          Physical Exam   Triage Vital Signs: ED Triage Vitals  Enc Vitals Group     BP 11/14/22 1120 115/74     Pulse Rate 11/14/22 1120 99     Resp 11/14/22 1120 16     Temp  11/14/22 1120 98.1 F (36.7 C)     Temp Source 11/14/22 1120 Oral     SpO2 11/14/22 1120 98 %     Weight 11/14/22 0949 199 lb 15.3 oz (90.7 kg)     Height 11/14/22 0949 '5\' 10"'$  (1.778 m)     Head Circumference --      Peak Flow --      Pain Score --      Pain Loc --      Pain Edu? --       Excl. in [place]? --     Most recent vital signs: Vitals:   11/14/22 1120  BP: 115/74  Pulse: 99  Resp: 16  Temp: 98.1 F (36.7 C)  SpO2: 98%    Physical Exam Vitals and nursing note reviewed.  Constitutional:      General: Awake and alert. No acute distress.    Appearance: Normal appearance. The patient is normal weight.  HENT:     Head: Normocephalic and atraumatic.     Mouth: Mucous membranes are moist.  No tongue or lip swelling Eyes:     General: PERRL. Normal EOMs        Right eye: No discharge.        Left eye: No discharge.     Conjunctiva/sclera: Conjunctivae normal.  Cardiovascular:     Rate and Rhythm: Normal rate and regular rhythm.     Pulses: Normal pulses.     Heart sounds: Normal heart sounds Pulmonary:     Effort: Pulmonary effort is normal. No respiratory distress.     Breath sounds: Normal breath sounds.  Abdominal:     Abdomen is soft. There is no abdominal tenderness. No rebound or guarding. No distention. Musculoskeletal:        General: No swelling. Normal range of motion.     Cervical back: Normal range of motion and neck supple.  Skin:    General: Skin is warm and dry.     Capillary Refill: Capillary refill takes less than 2 seconds.     Findings: Excoriations noted to trunk, no involvement of extremities. No coalescence. Very dry and flaky skin.  No mucosal or intraoral lesions.  No pustules, vesicles, listers, or bullae.  No target lesions. Neurological:     Mental Status: The patient is awake and alert.      ED Results / Procedures / Treatments   Labs (all labs ordered are listed, but only abnormal results are displayed) Labs Reviewed  COMPREHENSIVE METABOLIC PANEL - Abnormal; Notable for the following components:      Result Value   BUN 27 (*)    Calcium 8.5 (*)    Total Protein 6.2 (*)    AST 13 (*)    All other components within normal limits  CBC WITH DIFFERENTIAL/PLATELET - Abnormal; Notable for the following components:   RBC  2.73 (*)    Hemoglobin 7.8 (*)    HCT 26.1 (*)    MCHC 29.9 (*)    All other components within normal limits     EKG     RADIOLOGY     PROCEDURES:  Critical Care performed:   Procedures   MEDICATIONS ORDERED IN ED: Medications  cetirizine (ZYRTEC) tablet 5 mg (5 mg Oral Given 11/14/22 1303)     IMPRESSION / MDM / ASSESSMENT AND PLAN / ED COURSE  I reviewed the triage vital signs and the nursing notes.   Differential diagnosis includes, but is not limited to, contact dermatitis,  allergic reaction, dress syndrome.  Patient is awake and alert, hemodynamically stable and afebrile.  No tachycardia or hypoxia, no chest pain or shortness of breath to suggest PE.  He has been compliant with his Eliquis.  Patient has no eosinophilia, leukocytosis, neutrophilia, lymphocytosis, monocytosis, atypical lymphocytosis, no evidence of kidney or liver involvement, is not maculopapular, no target lesions, no extremity involvement, no facial swelling, no purpura, does not appear to be consistent with DRESS at this time.  No bullae, blistering, skin sloughing, or mucosal membrane involvement to suggest SJS or TENS.  No vesicles to suggest herpetic rash.  The rash is not painful.  He reports that it is itchy only.  Patient does have very dry flaky skin with excoriations to his trunk, and requested an ointment to help with this.  He was subsequently prescribed Aveeno.  He was instructed to follow-up with his outpatient provider.  We discussed very strict return precautions and the importance of close outpatient follow-up.   Patient's presentation is most consistent with acute complicated illness / injury requiring diagnostic workup.    FINAL CLINICAL IMPRESSION(S) / ED DIAGNOSES   Final diagnoses:  Rash     Rx / DC Orders   ED Discharge Orders          Ordered    Emollient (AVEENO ACTIVE NATURALS DAILY) LOTN  2 times daily        11/14/22 1300             Note:  This  document was prepared using Dragon voice recognition software and may include unintentional dictation errors.   Emeline Gins 11/14/22 1452    Rada Hay, MD 11/14/22 [phone]

## 2022-11-22 ENCOUNTER — Telehealth (INDEPENDENT_AMBULATORY_CARE_PROVIDER_SITE_OTHER): Payer: Self-pay

## 2022-11-22 NOTE — Telephone Encounter (Signed)
Patient daughter left a message informing that her father was having some bleeding on the back of the right leg. I spoke with patient daughter and she informed that her father has went to urgent care to be evaluated. She will contact the office if there is further concern.

## 2022-11-25 ENCOUNTER — Telehealth (INDEPENDENT_AMBULATORY_CARE_PROVIDER_SITE_OTHER): Payer: Self-pay | Admitting: Vascular Surgery

## 2022-11-25 NOTE — Telephone Encounter (Signed)
Pt and spouse came into the clinic today wanting to see Dr. Lucky Cowboy. Stated that Dr. Lucky Cowboy told him if he had any problems to come into the office "without an appt". I advised that Dr. Lucky Cowboy was not in clinic this afternoon - he is in surgery. His spouse/patient tried to explain (language barrier) that his leg was swollen, red and itchy. Explained that they were concerned and asked if they should go to the ED. I advised that again, with Dr. Lucky Cowboy not being in office, that would be their best bet. Rather than an urgent care etc. The wife stated that they would be going to the ED. I gave directions and the spouse and patient left.

## 2022-11-27 ENCOUNTER — Inpatient Hospital Stay
Admission: EM | Admit: 2022-11-27 | Discharge: 2022-11-30 | DRG: 271 | Disposition: A | Discharge status: Home / Self Care | Payer: Medicare Other | Attending: Internal Medicine | Admitting: Internal Medicine

## 2022-11-27 ENCOUNTER — Emergency Department: Payer: Medicare Other

## 2022-11-27 ENCOUNTER — Other Ambulatory Visit: Payer: Self-pay

## 2022-11-27 ENCOUNTER — Encounter (INDEPENDENT_AMBULATORY_CARE_PROVIDER_SITE_OTHER): Payer: Self-pay

## 2022-11-27 DIAGNOSIS — J849 Interstitial pulmonary disease, unspecified: Secondary | ICD-10-CM | POA: Diagnosis not present

## 2022-11-27 DIAGNOSIS — L299 Pruritus, unspecified: Secondary | ICD-10-CM | POA: Insufficient documentation

## 2022-11-27 DIAGNOSIS — G629 Polyneuropathy, unspecified: Secondary | ICD-10-CM | POA: Diagnosis present

## 2022-11-27 DIAGNOSIS — I82433 Acute embolism and thrombosis of popliteal vein, bilateral: Secondary | ICD-10-CM

## 2022-11-27 DIAGNOSIS — I82220 Acute embolism and thrombosis of inferior vena cava: Secondary | ICD-10-CM | POA: Diagnosis not present

## 2022-11-27 DIAGNOSIS — E785 Hyperlipidemia, unspecified: Secondary | ICD-10-CM | POA: Diagnosis present

## 2022-11-27 DIAGNOSIS — T82868A Thrombosis of vascular prosthetic devices, implants and grafts, initial encounter: Secondary | ICD-10-CM | POA: Diagnosis not present

## 2022-11-27 DIAGNOSIS — I82413 Acute embolism and thrombosis of femoral vein, bilateral: Secondary | ICD-10-CM | POA: Diagnosis present

## 2022-11-27 DIAGNOSIS — Z91048 Other nonmedicinal substance allergy status: Secondary | ICD-10-CM | POA: Diagnosis not present

## 2022-11-27 DIAGNOSIS — I959 Hypotension, unspecified: Secondary | ICD-10-CM | POA: Diagnosis present

## 2022-11-27 DIAGNOSIS — G6289 Other specified polyneuropathies: Secondary | ICD-10-CM | POA: Diagnosis not present

## 2022-11-27 DIAGNOSIS — Z86718 Personal history of other venous thrombosis and embolism: Secondary | ICD-10-CM

## 2022-11-27 DIAGNOSIS — Z888 Allergy status to other drugs, medicaments and biological substances status: Secondary | ICD-10-CM

## 2022-11-27 DIAGNOSIS — Z86711 Personal history of pulmonary embolism: Secondary | ICD-10-CM | POA: Diagnosis not present

## 2022-11-27 DIAGNOSIS — Z881 Allergy status to other antibiotic agents status: Secondary | ICD-10-CM | POA: Diagnosis not present

## 2022-11-27 DIAGNOSIS — I10 Essential (primary) hypertension: Secondary | ICD-10-CM | POA: Diagnosis present

## 2022-11-27 DIAGNOSIS — D500 Iron deficiency anemia secondary to blood loss (chronic): Secondary | ICD-10-CM | POA: Diagnosis not present

## 2022-11-27 DIAGNOSIS — I9589 Other hypotension: Secondary | ICD-10-CM | POA: Diagnosis not present

## 2022-11-27 DIAGNOSIS — I82432 Acute embolism and thrombosis of left popliteal vein: Secondary | ICD-10-CM | POA: Diagnosis present

## 2022-11-27 DIAGNOSIS — I82521 Chronic embolism and thrombosis of right iliac vein: Secondary | ICD-10-CM | POA: Diagnosis not present

## 2022-11-27 DIAGNOSIS — D509 Iron deficiency anemia, unspecified: Secondary | ICD-10-CM

## 2022-11-27 DIAGNOSIS — I824Y3 Acute embolism and thrombosis of unspecified deep veins of proximal lower extremity, bilateral: Principal | ICD-10-CM

## 2022-11-27 DIAGNOSIS — Z91013 Allergy to seafood: Secondary | ICD-10-CM | POA: Diagnosis not present

## 2022-11-27 DIAGNOSIS — I82401 Acute embolism and thrombosis of unspecified deep veins of right lower extremity: Secondary | ICD-10-CM | POA: Diagnosis not present

## 2022-11-27 DIAGNOSIS — I82422 Acute embolism and thrombosis of left iliac vein: Secondary | ICD-10-CM | POA: Diagnosis present

## 2022-11-27 DIAGNOSIS — Z9049 Acquired absence of other specified parts of digestive tract: Secondary | ICD-10-CM | POA: Diagnosis not present

## 2022-11-27 DIAGNOSIS — I82409 Acute embolism and thrombosis of unspecified deep veins of unspecified lower extremity: Secondary | ICD-10-CM | POA: Diagnosis present

## 2022-11-27 DIAGNOSIS — I82402 Acute embolism and thrombosis of unspecified deep veins of left lower extremity: Secondary | ICD-10-CM | POA: Diagnosis not present

## 2022-11-27 DIAGNOSIS — Z79899 Other long term (current) drug therapy: Secondary | ICD-10-CM

## 2022-11-27 DIAGNOSIS — Z7901 Long term (current) use of anticoagulants: Secondary | ICD-10-CM | POA: Diagnosis not present

## 2022-11-27 DIAGNOSIS — I82511 Chronic embolism and thrombosis of right femoral vein: Secondary | ICD-10-CM | POA: Diagnosis not present

## 2022-11-27 DIAGNOSIS — Z880 Allergy status to penicillin: Secondary | ICD-10-CM

## 2022-11-27 LAB — COMPREHENSIVE METABOLIC PANEL
ALT: 12 U/L (ref 0–44)
AST: 14 U/L — ABNORMAL LOW (ref 15–41)
Albumin: 3.7 g/dL (ref 3.5–5.0)
Alkaline Phosphatase: 79 U/L (ref 38–126)
Anion gap: 8 (ref 5–15)
BUN: 17 mg/dL (ref 8–23)
CO2: 21 mmol/L — ABNORMAL LOW (ref 22–32)
Calcium: 8.6 mg/dL — ABNORMAL LOW (ref 8.9–10.3)
Chloride: 110 mmol/L (ref 98–111)
Creatinine, Ser: 0.93 mg/dL (ref 0.61–1.24)
GFR, Estimated: >60 mL/min (ref >60)
Glucose, Bld: 121 mg/dL — ABNORMAL HIGH (ref 70–99)
Potassium: 3.8 mmol/L (ref 3.5–5.1)
Sodium: 139 mmol/L (ref 135–145)
Total Bilirubin: 0.6 mg/dL (ref 0.3–1.2)
Total Protein: 6.8 g/dL (ref 6.5–8.1)

## 2022-11-27 LAB — CBC WITH DIFFERENTIAL/PLATELET
Abs Immature Granulocytes: 0.01 10*3/uL (ref 0.00–0.07)
Basophils Absolute: 0.1 10*3/uL (ref 0.0–0.1)
Basophils Relative: 1 %
Eosinophils Absolute: 0.3 10*3/uL (ref 0.0–0.5)
Eosinophils Relative: 7 %
HCT: 28 % — ABNORMAL LOW (ref 39.0–52.0)
Hemoglobin: 8.3 g/dL — ABNORMAL LOW (ref 13.0–17.0)
Immature Granulocytes: 0 %
Lymphocytes Relative: 26 %
Lymphs Abs: 1.4 10*3/uL (ref 0.7–4.0)
MCH: 26.8 pg (ref 26.0–34.0)
MCHC: 29.6 g/dL — ABNORMAL LOW (ref 30.0–36.0)
MCV: 90.3 fL (ref 80.0–100.0)
Monocytes Absolute: 0.5 10*3/uL (ref 0.1–1.0)
Monocytes Relative: 10 %
Neutro Abs: 2.9 10*3/uL (ref 1.7–7.7)
Neutrophils Relative %: 56 %
Platelets: 236 10*3/uL (ref 150–400)
RBC: 3.1 MIL/uL — ABNORMAL LOW (ref 4.22–5.81)
RDW: 15.5 % (ref 11.5–15.5)
WBC: 5.2 10*3/uL (ref 4.0–10.5)
nRBC: 0 % (ref 0.0–0.2)

## 2022-11-27 LAB — D-DIMER, QUANTITATIVE: D-Dimer, Quant: 2.67 ug/mL-FEU — ABNORMAL HIGH (ref 0.00–0.50)

## 2022-11-27 LAB — PROTIME-INR
INR: 1.6 — ABNORMAL HIGH (ref 0.8–1.2)
Prothrombin Time: 19 seconds — ABNORMAL HIGH (ref 11.4–15.2)

## 2022-11-27 LAB — HEPARIN LEVEL (UNFRACTIONATED): Heparin Unfractionated: >1.1 IU/mL — ABNORMAL HIGH (ref 0.30–0.70)

## 2022-11-27 LAB — APTT: aPTT: 59 seconds — ABNORMAL HIGH (ref 24–36)

## 2022-11-27 MED ORDER — SODIUM CHLORIDE 0.9 % IV SOLN
0.1500 mg/kg/h | INTRAVENOUS | Status: DC
  Administered 2022-11-27 – 2022-11-29 (×4): 0.15 mg/kg/h via INTRAVENOUS
  Filled 2022-11-27 (×2): qty 250

## 2022-11-27 MED ORDER — MORPHINE SULFATE (PF) 4 MG/ML IV SOLN
4.0000 mg | INTRAVENOUS | Status: DC | PRN
  Administered 2022-11-27: 4 mg via INTRAVENOUS
  Filled 2022-11-27: qty 1

## 2022-11-27 MED ORDER — GABAPENTIN 300 MG PO CAPS
300.0000 mg | ORAL_CAPSULE | Freq: Three times a day (TID) | ORAL | Status: DC
  Administered 2022-11-27 – 2022-11-30 (×8): 300 mg via ORAL
  Filled 2022-11-27 (×8): qty 1

## 2022-11-27 MED ORDER — ONDANSETRON HCL 4 MG/2ML IJ SOLN: 4.0000 mg | Freq: Four times a day (QID) | INTRAMUSCULAR | Status: DC | PRN

## 2022-11-27 NOTE — Consult Note (Signed)
Reason for Consult: Bilateral lower extremity DVT with swollen legs Referring Physician: Dr. Lorna Dibble Russell [name] is an 72 y.o. male.  HPI: This is a gentleman who returns to the emergency room setting after having some recent procedures done to try to reestablish patency of his inferior vena cava, and the right iliac venous system.  This has been a difficult road, as his initial procedure in December was performed on the seventh with right popliteal vein access, instillation of tPA, penumbra CAT 16 thrombectomy of the iliac and inferior vena cava, and at that time crossing of the level of the filter could not be performed.  Significant blood loss during this procedure at 500 cc dictated termination at this point.  This was actually repeated on the 18th as he had recurrence of significant swelling.  More recently on 09 November 2022 he had right internal jugular and right common femoral access established, tPA infused in the thrombus, CAT 16 thrombus removal in the iliac and vena cava position, while the filter was in place, then the filter was removed via the IJ access, with 2 struts remaining that were felt to be extra cable.  He had residual stenosis that was then treated with a 20 x 60 the Novo stent and post balloon to 10 mm.  He has a heparin allergy and was placed on Eliquis.  He evidently had increasing swelling of his legs earlier this month predating admission, comes today due to significant swelling, right greater than left, but not phlegmasia.  He has had no shortness of breath that he claims to me.  He has been taking his Eliquis faithfully.  I am seeing him in this context.  He does not know whether he has a hypercoagulable state present.  No family history that he can elicit.  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   Hyperlipidemia    Hypertension     Past Surgical History:  Procedure Laterality Date   BACK SURGERY      COLONOSCOPY WITH PROPOFOL N/A 11/05/2019   Procedure: COLONOSCOPY WITH PROPOFOL;  Surgeon: [name], MD;  [facility] ENDOSCOPY;  Service: Gastroenterology;  Laterality: N/A;   IVC FILTER REMOVAL N/A 11/09/2022   Procedure: IVC FILTER REMOVAL;  Surgeon: Algernon Huxley, MD;  Location: [place] CV LAB;  Service: Cardiovascular;  Laterality: N/A;   PARTIAL COLECTOMY N/A 02/11/2020   Procedure: PARTIAL COLECTOMY -- sigmoid;  Surgeon: Olean Ree, MD;  Location: [facility] ORS;  Service: General;  Laterality: N/A;   PERIPHERAL VASCULAR THROMBECTOMY Right 10/07/2022   Procedure: PERIPHERAL VASCULAR THROMBECTOMY;  Surgeon: Algernon Huxley, MD;  Location: [place] CV LAB;  Service: Cardiovascular;  Laterality: Right;   PERIPHERAL VASCULAR THROMBECTOMY N/A 10/27/2022   Procedure: PERIPHERAL VASCULAR THROMBECTOMY;  Surgeon: Algernon Huxley, MD;  Location: [place] CV LAB;  Service: Cardiovascular;  Laterality: N/A;   PERIPHERAL VASCULAR THROMBECTOMY Right 11/07/2022   Procedure: PERIPHERAL VASCULAR THROMBECTOMY;  Surgeon: Algernon Huxley, MD;  Location: [place] CV LAB;  Service: Cardiovascular;  Laterality: Right;   PERIPHERAL VASCULAR THROMBECTOMY N/A 11/09/2022   Procedure: PERIPHERAL VASCULAR THROMBECTOMY;  Surgeon: Algernon Huxley, MD;  Location: [place] CV LAB;  Service: Cardiovascular;  Laterality: N/A;  Inferior Vena Cava Thrombectomy   SPINE SURGERY     TAKE DOWN OF INTESTINAL FISTULA N/A 02/11/2020   Procedure: TAKE DOWN OF INTESTINAL FISTULA -- colovesical fistula;  Surgeon: Hampton Abbot,  Jacqulyn Bath, MD;  Location: [facility] ORS;  Service: General;  Laterality: N/A;   TRACHEAL SURGERY      Family History  Problem Relation Age of Onset   Prostate cancer Neg Hx    Bladder Cancer Neg Hx    Kidney cancer Neg Hx     Social History:  reports that he has never smoked. He has never used smokeless tobacco. He reports that he does not drink alcohol and does not use drugs.  Allergies:  Allergies   Allergen Reactions   Penicillins Swelling   Shrimp [Shellfish Allergy] Hives   Cephalosporins Hives and Itching   Cisatracurium Other (See Comments)    Other reaction(s): Unknown Other reaction(s): Unknown    Heparin Hives and Swelling    Patient's daughter reports patient had swelling/hives during hospital admission in [place]. They were on several medications at once so she is unsure whether it was VANCOMYCIN that caused this reaction or another concomitant medication.    Rocuronium Other (See Comments)    Other reaction(s): Unknown Other reaction(s): Unknown    Vancomycin Hives and Swelling    Patient's daughter reports patient had swelling/hives during hospital admission in [place]. They were on several medications at once so she is unsure whether it was VANCOMYCIN that caused this reaction or another concomitant medication.    Wound Dressings Rash    Tegaderm     Medications: I have reviewed the patient's current medications.  Results for orders placed or performed during the hospital encounter of 11/27/22 (from the past 48 hour(s))  CBC with Differential     Status: Abnormal   Collection Time: 11/27/22  2:42 PM  Result Value Ref Range   WBC 5.2 4.0 - 10.5 K/uL   RBC 3.10 (L) 4.22 - 5.81 MIL/uL   Hemoglobin 8.3 (L) 13.0 - 17.0 g/dL   HCT 28.0 (L) 39.0 - 52.0 %   MCV 90.3 80.0 - 100.0 fL   MCH 26.8 26.0 - 34.0 pg   MCHC 29.6 (L) 30.0 - 36.0 g/dL   RDW 15.5 11.5 - 15.5 %   Platelets 236 150 - 400 K/uL   nRBC 0.0 0.0 - 0.2 %   Neutrophils Relative % 56 %   Neutro Abs 2.9 1.7 - 7.7 K/uL   Lymphocytes Relative 26 %   Lymphs Abs 1.4 0.7 - 4.0 K/uL   Monocytes Relative 10 %   Monocytes Absolute 0.5 0.1 - 1.0 K/uL   Eosinophils Relative 7 %   Eosinophils Absolute 0.3 0.0 - 0.5 K/uL   Basophils Relative 1 %   Basophils Absolute 0.1 0.0 - 0.1 K/uL   Immature Granulocytes 0 %   Abs Immature Granulocytes 0.01 0.00 - 0.07 K/uL    Comment: Performed at [facility], [address] [place], [place] [address]  Comprehensive metabolic panel     Status: Abnormal   Collection Time: 11/27/22  2:42 PM  Result Value Ref Range   Sodium 139 135 - 145 mmol/L   Potassium 3.8 3.5 - 5.1 mmol/L   Chloride 110 98 - 111 mmol/L   CO2 21 (L) 22 - 32 mmol/L   Glucose, Bld 121 (H) 70 - 99 mg/dL    Comment: Glucose reference range applies only to samples taken after fasting for at least 8 hours.   BUN 17 8 - 23 mg/dL   Creatinine, Ser 0.93 0.61 - 1.24 mg/dL   Calcium 8.6 (L) 8.9 - 10.3 mg/dL   Total Protein 6.8 6.5 - 8.1 g/dL  Albumin 3.7 3.5 - 5.0 g/dL   AST 14 (L) 15 - 41 U/L   ALT 12 0 - 44 U/L   Alkaline Phosphatase 79 38 - 126 U/L   Total Bilirubin 0.6 0.3 - 1.2 mg/dL   GFR, Estimated >60 >60 mL/min    Comment: (NOTE) Calculated using the CKD-EPI Creatinine Equation (2021)    Anion gap 8 5 - 15    Comment: Performed at [facility], [address] [place], [place] [address]  D-dimer, quantitative     Status: Abnormal   Collection Time: 11/27/22  2:42 PM  Result Value Ref Range   D-Dimer, Quant 2.67 (H) 0.00 - 0.50 ug/mL-FEU    Comment: (NOTE) At the manufacturer cut-off value of 0.5 g/mL FEU, this assay has a negative predictive value of 95-100%.This assay is intended for use in conjunction with a clinical pretest probability (PTP) assessment model to exclude pulmonary embolism (PE) and deep venous thrombosis (DVT) in outpatients suspected of PE or DVT. Results should be correlated with clinical presentation. Performed at [facility], [address] [place], [place] [address]     US Venous Img Lower Bilateral  Result Date: 11/27/2022 CLINICAL DATA:  History of DVT. Complains of bilateral lower extremity swelling. EXAM: BILATERAL LOWER EXTREMITY VENOUS DOPPLER ULTRASOUND TECHNIQUE: Gray-scale sonography with graded compression, as well as color Doppler and duplex ultrasound were performed to evaluate the lower extremity deep venous  systems from the level of the common femoral vein and including the common femoral, femoral, profunda femoral, popliteal and calf veins including the posterior tibial, peroneal and gastrocnemius veins when visible. The superficial great saphenous vein was also interrogated. Spectral Doppler was utilized to evaluate flow at rest and with distal augmentation maneuvers in the common femoral, femoral and popliteal veins. COMPARISON:  11/05/2022 FINDINGS: RIGHT LOWER EXTREMITY Common Femoral Vein: Occlusive thrombus identified Saphenofemoral Junction: No evidence of thrombus. Normal compressibility and flow on color Doppler imaging. Profunda Femoral Vein: Occlusive thrombus identified. Femoral Vein: Occlusive thrombus identified. Popliteal Vein: No evidence of thrombus. Normal compressibility, respiratory phasicity and response to augmentation. Calf Veins: No evidence of thrombus. Normal compressibility and flow on color Doppler imaging. Superficial Great Saphenous Vein: No evidence of thrombus. Normal compressibility. Venous Reflux:  None. Other Findings: Subcutaneous edema identified at the level of the calf veins. LEFT LOWER EXTREMITY Common Femoral Vein: Occlusive thrombus identified. Saphenofemoral Junction: Occlusive thrombus identified. Profunda Femoral Vein: Occlusive thrombus identified. Femoral Vein: Occlusive thrombus identified. Popliteal Vein: Occlusive thrombus identified. Calf Veins: No evidence of thrombus. Normal compressibility and flow on color Doppler imaging. Superficial Great Saphenous Vein: No evidence of thrombus. Normal compressibility. Venous Reflux:  None. Other Findings:  None. IMPRESSION: Exam is positive for bilateral lower extremity deep vein thromboses Critical Value/emergent results were called by telephone at the time of interpretation on 11/27/2022 at 4:27 pm to provider Dr. Joni Fears, who verbally acknowledged these results. Electronically Signed   By: Kerby Moors M.D.   On: 11/27/2022  16:27    Review of Systems Blood pressure 112/75, pulse 85, temperature 98.5 F (36.9 C), temperature source Oral, resp. rate 18, height '5\' 10"'$  (1.778 m), weight 90.7 kg, SpO2 98 %. Physical Exam  This is a very pleasant 72 year old gentleman in the ER hallway in no acute distress.  He is awake, alert, oriented.  Normocephalic atraumatic.  Upper extremities are symmetric in appearance.  His right IJ access site has healed nicely.  He has no accessory muscle use or tachypnea.  He has good capillary refill throughout.  Lower extremities are warm, well-perfused, but edema is present more so on the right than the left.  There is no phlegmasia per se.    Assessment/Plan: 1.  Bilateral lower extremity DVT by ultrasound with presumptive iliac and vena caval recurrent thrombosis. Plan will be for bivalirudin drip, with reimaging tomorrow.  It is unclear whether he has an anatomic reason for this to recur or a resistance to the pharmacology that would require adjustment.  Clinical course will be determined on venogram findings and intervention that is necessary.  Will tentatively make him n.p.o. after midnight for potential intervention tomorrow.  No suggestion of PE at this time, thus no CT was ordered.  I discussed this with him in detail and he is in agreement with the tentative plan.  Zara Chess 11/27/2022, 6:54 PM

## 2022-11-27 NOTE — Consult Note (Signed)
ANTICOAGULATION CONSULT NOTE  Pharmacy Consult for bivalirudin drip Indication: DVT  Allergies  Allergen Reactions   Penicillins Swelling   Shrimp [Shellfish Allergy] Hives   Cephalosporins Hives and Itching   Cisatracurium Other (See Comments)    Other reaction(s): Unknown Other reaction(s): Unknown    Heparin Hives and Swelling    Patient's daughter reports patient had swelling/hives during hospital admission in [place]. They were on several medications at once so she is unsure whether it was VANCOMYCIN that caused this reaction or another concomitant medication.    Rocuronium Other (See Comments)    Other reaction(s): Unknown Other reaction(s): Unknown    Vancomycin Hives and Swelling    Patient's daughter reports patient had swelling/hives during hospital admission in [place]. They were on several medications at once so she is unsure whether it was VANCOMYCIN that caused this reaction or another concomitant medication.    Wound Dressings Rash    Tegaderm     Patient Measurements: Height: '5\' 10"'$  (177.8 cm) Weight: 90.7 kg (200 lb) IBW/kg (Calculated) : 73   Vital Signs: Temp: 98.5 F (36.9 C) (01/07 1435) Temp Source: Oral (01/07 1435) BP: 112/75 (01/07 1753) Pulse Rate: 85 (01/07 1753)  Labs: Recent Labs    11/27/22 1442 11/27/22 2146  HGB 8.3*  --   HCT 28.0*  --   PLT 236  --   APTT  --  59*  LABPROT  --  19.0*  INR  --  1.6*  HEPARINUNFRC  --  >1.10*  CREATININE 0.93  --      Estimated Creatinine Clearance: 81.3 mL/min (by C-G formula based on SCr of 0.93 mg/dL).   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  Patient has history or recurrent DVTs in lower extremities on Eliquis at home.  Venous  doppler showing bilateral DVTs.  Patient has reported heparin allergy, pharmacy consult to start bivalirudin infusion.  Hgb 8.3, plt 236 Baseline aPTT and PT/INR pending  Goal of Therapy:  aPTT 50-85 seconds Monitor platelets by anticoagulation protocol: Yes   Plan:  Start bivalirudin at 0.15 mg/kg/hr (13.605 mg/hr) 1/7: APTT @ 2146 = 56, therapeutic X 1  Will recheck aPTT in 4 hrs on 1/8 @ 0200.  Daily CBC  Mizael Sagar D, PharmD 11/27/2022,11:55 PM

## 2022-11-27 NOTE — ED Provider Notes (Signed)
[facility] Provider Note    Event Date/Time   First MD Initiated Contact with Patient 11/27/22 1707     (approximate)   History   Chief Complaint: Post-op Problem and Leg Swelling   HPI  Russell [name] is a 72 y.o. male with a history of hypertension, hyperlipidemia, IVC and iliac thrombosis who comes ED complaining of pain and swelling of bilateral lower extremities.  No significant chest pain shortness of breath or fever.  Reviewed outside records noting that on November 09, 2022 patient underwent vascular procedure to remove an IVC filter, performed mechanical thrombectomy and thrombolysis throughout the IVC and right iliac system.  Discharged home on Eliquis.  Reports compliance with his medications.      Physical Exam   Triage Vital Signs: ED Triage Vitals  Enc Vitals Group     BP 11/27/22 1435 108/66     Pulse Rate 11/27/22 1435 (!) 105     Resp 11/27/22 1435 18     Temp 11/27/22 1435 98.5 F (36.9 C)     Temp Source 11/27/22 1435 Oral     SpO2 11/27/22 1435 96 %     Weight 11/27/22 1436 200 lb (90.7 kg)     Height 11/27/22 1436 '5\' 10"'$  (1.778 m)     Head Circumference --      Peak Flow --      Pain Score 11/27/22 1435 8     Pain Loc --      Pain Edu? --      Excl. in [place]? --     Most recent vital signs: Vitals:   11/27/22 1435 11/27/22 1753  BP: 108/66 112/75  Pulse: (!) 105 85  Resp: 18 18  Temp: 98.5 F (36.9 C)   SpO2: 96% 98%    General: Awake, no distress.  CV:  Good peripheral perfusion.  Resp:  Normal effort.  Abd:  No distention.  Other:  Pronounced nonpitting edema bilateral lower extremities with easy erythema diffusely.  Skin is distended and dry.  Not consistent with frank phlegmasia.  Good distal pulses.   ED Results / Procedures / Treatments   Labs (all labs ordered are listed, but only abnormal results are displayed) Labs Reviewed  CBC WITH DIFFERENTIAL/PLATELET - Abnormal; Notable for the following  components:      Result Value   RBC 3.10 (*)    Hemoglobin 8.3 (*)    HCT 28.0 (*)    MCHC 29.6 (*)    All other components within normal limits  COMPREHENSIVE METABOLIC PANEL - Abnormal; Notable for the following components:   CO2 21 (*)    Glucose, Bld 121 (*)    Calcium 8.6 (*)    AST 14 (*)    All other components within normal limits  D-DIMER, QUANTITATIVE - Abnormal; Notable for the following components:   D-Dimer, Quant 2.67 (*)    All other components within normal limits  PROTIME-INR - Abnormal; Notable for the following components:   Prothrombin Time 19.0 (*)    INR 1.6 (*)    All other components within normal limits  HEPARIN LEVEL (UNFRACTIONATED) - Abnormal; Notable for the following components:   Heparin Unfractionated >1.10 (*)    All other components within normal limits  APTT - Abnormal; Notable for the following components:   aPTT 59 (*)    All other components within normal limits  CBC  APTT     EKG    RADIOLOGY Ultrasound bilateral lower extremities  interpreted by me, shows bilateral common femoral DVTs.  Discussed with radiology noting extensive proximal DVT bilaterally.   PROCEDURES:  .Critical Care  Performed by: Carrie Mew, MD Authorized by: Carrie Mew, MD   Critical care provider statement:    Critical care time (minutes):  35   Critical care time was exclusive of:  Separately billable procedures and treating other patients   Critical care was necessary to treat or prevent imminent or life-threatening deterioration of the following conditions:  Circulatory failure   Critical care was time spent personally by me on the following activities:  Development of treatment plan with patient or surrogate, discussions with consultants, evaluation of patient's response to treatment, examination of patient, obtaining history from patient or surrogate, ordering and performing treatments and interventions, ordering and review of laboratory  studies, ordering and review of radiographic studies, pulse oximetry, re-evaluation of patient's condition and review of old charts   Care discussed with: admitting provider      MEDICATIONS ORDERED IN ED: Medications  bivalirudin (ANGIOMAX) 250 mg in sodium chloride 0.9 % 500 mL (0.5 mg/mL) infusion (0.15 mg/kg/hr  90.7 kg Intravenous New Bag/Given 11/27/22 1806)  gabapentin (NEURONTIN) capsule 300 mg (300 mg Oral Given 11/27/22 2106)  morphine (PF) 4 MG/ML injection 4 mg (4 mg Intravenous Given 11/27/22 2107)  ondansetron (ZOFRAN) injection 4 mg (has no administration in time range)     IMPRESSION / MDM / [place] / ED COURSE  I reviewed the triage vital signs and the nursing notes.                              Differential diagnosis includes, but is not limited to, recurrent proximal DVT bilateral lower extremities, IVC thrombosis.  Doubt cellulitis, necrotizing fasciitis, osteomyelitis, septic arthritis, limb ischemia  Patient's presentation is most consistent with acute presentation with potential threat to life or bodily function.  Patient presents with worsening bilateral lower extremity swelling.  Ultrasound positive for proximal DVT bilaterally, suspicious for thrombosis extending up to IVC potentially.  Has a heparin allergy, started on bivalirudin.  Case discussed with vascular surgery Dr. Shelia Media who will arrange for intervention tomorrow.       FINAL CLINICAL IMPRESSION(S) / ED DIAGNOSES   Final diagnoses:  Acute deep vein thrombosis (DVT) of proximal end of both lower extremities ([place])     Rx / DC Orders   ED Discharge Orders     None        Note:  This document was prepared using Dragon voice recognition software and may include unintentional dictation errors.   Carrie Mew, MD 11/28/22 [phone]

## 2022-11-27 NOTE — H&P (Signed)
History and Physical    Patient: Russell [name] SAY:[id number] DOB: 10/04/1951 DOA: 11/27/2022 DOS: the patient was seen and examined on 11/27/2022 PCP: Norm Parcel, MD  Patient coming from: Home  Chief Complaint:  Chief Complaint  Patient presents with   Post-op Problem   Leg Swelling   HPI: Russell [name] is a 72 y.o. 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  He has had recurrent procedures that was a second one.  Patient also has history of osteoarthritis, essential hypertension, hyperlipidemia and previous history of colovesical fistula.  He was brought in today secondary to right leg swelling.  Family believes patient has another clot in his right leg.  He also has developed swelling on the left foot.  He contacted his vascular surgeons and consulted to the ER.  Patient has been having shortness of breath which was also needed.  Patient was seen in the ER.  Workup showed positive for bilateral lower extremity DVT.  Vascular surgery consulted and plan to do procedure for the patient in the morning.  He is being admitted and initiated on anticoagulation.   Review of Systems: As mentioned in the history of present illness. All other systems reviewed and are negative. 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   Hyperlipidemia    Hypertension    Past Surgical History:  Procedure Laterality Date   BACK SURGERY     COLONOSCOPY WITH PROPOFOL N/A 11/05/2019   Procedure: COLONOSCOPY WITH PROPOFOL;  Surgeon: [name], MD;  Location: [facility] ENDOSCOPY;  Service: Gastroenterology;  Laterality: N/A;   IVC FILTER REMOVAL N/A 11/09/2022   Procedure: IVC FILTER REMOVAL;  Surgeon: Algernon Huxley, MD;  Location: [address] CV LAB;  Service: Cardiovascular;  Laterality: N/A;    PARTIAL COLECTOMY N/A 02/11/2020   Procedure: PARTIAL COLECTOMY -- sigmoid;  Surgeon: Olean Ree, MD;  Location: [facility] ORS;  Service: General;  Laterality: N/A;   PERIPHERAL VASCULAR THROMBECTOMY Right 10/07/2022   Procedure: PERIPHERAL VASCULAR THROMBECTOMY;  Surgeon: Algernon Huxley, MD;  Location: [place] [facility] LAB;  Service: Cardiovascular;  Laterality: Right;   PERIPHERAL VASCULAR THROMBECTOMY N/A 10/27/2022   Procedure: PERIPHERAL VASCULAR THROMBECTOMY;  Surgeon: Algernon Huxley, MD;  Location: [place] CV LAB;  Service: Cardiovascular;  Laterality: N/A;   PERIPHERAL VASCULAR THROMBECTOMY Right 11/07/2022   Procedure: PERIPHERAL VASCULAR THROMBECTOMY;  Surgeon: Algernon Huxley, MD;  Location: [place] CV LAB;  Service: Cardiovascular;  Laterality: Right;   PERIPHERAL VASCULAR THROMBECTOMY N/A 11/09/2022   Procedure: PERIPHERAL VASCULAR THROMBECTOMY;  Surgeon: Algernon Huxley, MD;  Location: [place] CV LAB;  Service: Cardiovascular;  Laterality: N/A;  Inferior Vena Cava Thrombectomy   SPINE SURGERY     TAKE DOWN OF INTESTINAL FISTULA N/A 02/11/2020   Procedure: TAKE DOWN OF INTESTINAL FISTULA -- colovesical fistula;  Surgeon: Olean Ree, MD;  Location: [facility] ORS;  Service: General;  Laterality: N/A;   TRACHEAL SURGERY     Social History:  reports that he has never smoked. He has never used smokeless tobacco. He reports that he does not drink alcohol and does not use drugs.  Allergies  Allergen Reactions   Penicillins Swelling   Shrimp [Shellfish Allergy] Hives   Cephalosporins Hives and Itching   Cisatracurium Other (See Comments)  Other reaction(s): Unknown Other reaction(s): Unknown    Heparin Hives and Swelling    Patient's daughter reports patient had swelling/hives during hospital admission in [place]. They were on several medications at once so she is unsure whether it was VANCOMYCIN that caused this reaction or another concomitant medication.    Rocuronium Other (See  Comments)    Other reaction(s): Unknown Other reaction(s): Unknown    Vancomycin Hives and Swelling    Patient's daughter reports patient had swelling/hives during hospital admission in [place]. They were on several medications at once so she is unsure whether it was VANCOMYCIN that caused this reaction or another concomitant medication.    Wound Dressings Rash    Tegaderm     Family History  Problem Relation Age of Onset   Prostate cancer Neg Hx    Bladder Cancer Neg Hx    Kidney cancer Neg Hx     Prior to Admission medications   Medication Sig Start Date End Date Taking? Authorizing Provider  apixaban (ELIQUIS) 5 MG TABS tablet Take 1 tablet (5 mg total) by mouth 2 (two) times daily. 11/17/22 11/17/23 Yes Enzo Bi, MD  doxycycline (VIBRAMYCIN) 100 MG capsule Take 100 mg by mouth daily. 10/17/22  Yes [provider]  Iron, Ferrous Sulfate, 325 (65 Fe) MG TABS Take 1 tablet by mouth daily at 12 noon. Can take any over-the-counter form of iron supplement. 11/10/22  Yes Enzo Bi, MD  lidocaine (XYLOCAINE) 5 % ointment Apply 1 Application topically 3 (three) times daily as needed for mild pain. 09/28/22  Yes [provider]  apixaban (ELIQUIS) 5 MG TABS tablet Take 2 tablets (10 mg total) by mouth 2 (two) times daily for 7 days. 11/10/22 11/17/22  Enzo Bi, MD  Emollient [facility] ACTIVE [employer]URALS DAILY) LOTN Apply 1 Application topically in the morning and at bedtime. 11/14/22   Poggi, Eliezer Lofts E, PA-C  gabapentin (NEURONTIN) 300 MG capsule Take 300 mg by mouth 3 (three) times daily. Patient not taking: Reported on 11/27/2022    [provider]    Physical Exam: Vitals:   11/27/22 1435 11/27/22 1436 11/27/22 1753  BP: 108/66  112/75  Pulse: (!) 105  85  Resp: 18  18  Temp: 98.5 F (36.9 C)    TempSrc: Oral    SpO2: 96%  98%  Weight:  90.7 kg   Height:  '5\' 10"'$  (1.778 m)    Constitutional: Pleasant, Spanish-speaking, NAD, calm, comfortable Eyes: PERRL, lids  and conjunctivae normal ENMT: Mucous membranes are moist. Posterior pharynx clear of any exudate or lesions.Normal dentition.  Neck: normal, supple, no masses, no thyromegaly Respiratory: clear to auscultation bilaterally, no wheezing, no crackles. Normal respiratory effort. No accessory muscle use.  Cardiovascular: Sinus tachycardia, no murmurs / rubs / gallops. No extremity edema. 2+ pedal pulses. No carotid bruits.  Abdomen: no tenderness, no masses palpated. No hepatosplenomegaly. Bowel sounds positive.  Musculoskeletal: Bilateral lower extremity swelling, red, slightly tender, good range of motion, no joint swelling or tenderness, Skin: no rashes, lesions, ulcers. No induration Neurologic: CN 2-12 grossly intact. Sensation intact, DTR normal. Strength 5/5 in all 4.  Psychiatric: Normal judgment and insight. Alert and oriented x 3. Normal mood  Data Reviewed:  CO2 21 with glucose 121, AST of 14, white count is 5.2 hemoglobin 8.3, platelets 236.  D-dimer 2.67, bilateral lower extremity Doppler ultrasound was positive for DVT  Assessment and Plan:  #1 recurrent DVTs: Patient has active bilateral lower extremity DVT.  Patient has been  seen and evaluated by vascular surgery.  He reported some shortness of breath but no obvious respiratory distress.  Patient will be admitted.  Started on anticoagulation.  Vascular surgery planning procedure in the morning.  Will keep patient n.p.o. after midnight.  Continue with supportive care.  Further care will be per Vascular surgery.   #2 history of PE: Continue aspirin well.  #3 essential hypertension: Confirm on resume home regimen.  #4 interstitial lung disease: Stable at baseline.  Mild shortness of breath.  #5 peripheral neuropathy: Continue home regimen  #6 hyperlipidemia: Confirm on resume home regimen  # anemia chronic disease: Hemoglobin stable.  Continue monitoring     Advance Care Planning:   Code Status: Full Code full  code  Consults: Vascular surgery Dr. Shelia Media  Family Communication: No family at bedside  Severity of Illness: The appropriate patient status for this patient is INPATIENT. Inpatient status is judged to be reasonable and necessary in order to provide the required intensity of service to ensure the patient's safety. The patient's presenting symptoms, physical exam findings, and initial radiographic and laboratory data in the context of their chronic comorbidities is felt to place them at high risk for further clinical deterioration. Furthermore, it is not anticipated that the patient will be medically stable for discharge from the hospital within 2 midnights of admission.   * I certify that at the point of admission it is my clinical judgment that the patient will require inpatient hospital care spanning beyond 2 midnights from the point of admission due to high intensity of service, high risk for further deterioration and high frequency of surveillance required.*  AuthorBarbette Merino, MD 11/27/2022 6:26 PM  For on call review [email or web address].

## 2022-11-27 NOTE — Consult Note (Signed)
ANTICOAGULATION CONSULT NOTE  Pharmacy Consult for bivalirudin drip Indication: DVT  Allergies  Allergen Reactions   Penicillins Swelling   Shrimp [Shellfish Allergy] Hives   Cephalosporins Hives and Itching   Cisatracurium Other (See Comments)    Other reaction(s): Unknown Other reaction(s): Unknown    Heparin Hives and Swelling    Patient's daughter reports patient had swelling/hives during hospital admission in [place]. They were on several medications at once so she is unsure whether it was VANCOMYCIN that caused this reaction or another concomitant medication.    Rocuronium Other (See Comments)    Other reaction(s): Unknown Other reaction(s): Unknown    Vancomycin Hives and Swelling    Patient's daughter reports patient had swelling/hives during hospital admission in [place]. They were on several medications at once so she is unsure whether it was VANCOMYCIN that caused this reaction or another concomitant medication.    Wound Dressings Rash    Tegaderm     Patient Measurements: Height: '5\' 10"'$  (177.8 cm) Weight: 90.7 kg (200 lb) IBW/kg (Calculated) : 73   Vital Signs: Temp: 98.5 F (36.9 C) (01/07 1435) Temp Source: Oral (01/07 1435) BP: 108/66 (01/07 1435) Pulse Rate: 105 (01/07 1435)  Labs: Recent Labs    11/27/22 1442  HGB 8.3*  HCT 28.0*  PLT 236  CREATININE 0.93    Estimated Creatinine Clearance: 81.3 mL/min (by C-G formula based on SCr of 0.93 mg/dL).   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  Patient has history or recurrent DVTs in lower extremities on Eliquis at home.  Venous doppler showing bilateral DVTs.  Patient has reported heparin allergy, pharmacy consult to start bivalirudin  infusion.  Hgb 8.3, plt 236 Baseline aPTT and PT/INR pending  Goal of Therapy:  aPTT 50-85 seconds Monitor platelets by anticoagulation protocol: Yes   Plan:  Start bivalirudin at 0.15 mg/kg/hr (13.605 mg/hr) Check aPTT in 4 hours after initiation Daily CBC  Lorin Picket, PharmD 11/27/2022,5:30 PM

## 2022-11-27 NOTE — ED Triage Notes (Signed)
Pt with family who states that he has had problems with blood clots in his R leg- pt had surgeries to remove the clots a week ago but the swelling in his R leg has gotten worse and now he has swelling in his L foot- pt tried to go see his vascular dr but they did not have a dr in- pt has also had increased SHOB and rash on his legs

## 2022-11-27 NOTE — H&P (View-Only) (Signed)
Reason for Consult: Bilateral lower extremity DVT with swollen legs Referring Physician: Dr. Lorna Dibble Steyer is an 72 y.o. male.  HPI: This is a gentleman who returns to the emergency room setting after having some recent procedures done to try to reestablish patency of his inferior vena cava, and the right iliac venous system.  This has been a difficult road, as his initial procedure in December was performed on the seventh with right popliteal vein access, instillation of tPA, penumbra CAT 16 thrombectomy of the iliac and inferior vena cava, and at that time crossing of the level of the filter could not be performed.  Significant blood loss during this procedure at 500 cc dictated termination at this point.  This was actually repeated on the 18th as he had recurrence of significant swelling.  More recently on 09 November 2022 he had right internal jugular and right common femoral access established, tPA infused in the thrombus, CAT 16 thrombus removal in the iliac and vena cava position, while the filter was in place, then the filter was removed via the IJ access, with 2 struts remaining that were felt to be extra cable.  He had residual stenosis that was then treated with a 20 x 60 the Novo stent and post balloon to 10 mm.  He has a heparin allergy and was placed on Eliquis.  He evidently had increasing swelling of his legs earlier this month predating admission, comes today due to significant swelling, right greater than left, but not phlegmasia.  He has had no shortness of breath that he claims to me.  He has been taking his Eliquis faithfully.  I am seeing him in this context.  He does not know whether he has a hypercoagulable state present.  No family history that he can elicit.  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   Hyperlipidemia    Hypertension     Past Surgical History:  Procedure Laterality Date   BACK SURGERY      COLONOSCOPY WITH PROPOFOL N/A 11/05/2019   Procedure: COLONOSCOPY WITH PROPOFOL;  Surgeon: [name], MD;  Location: [facility] ENDOSCOPY;  Service: Gastroenterology;  Laterality: N/A;   IVC FILTER REMOVAL N/A 11/09/2022   Procedure: IVC FILTER REMOVAL;  Surgeon: Algernon Huxley, MD;  Location: [place] CV LAB;  Service: Cardiovascular;  Laterality: N/A;   PARTIAL COLECTOMY N/A 02/11/2020   Procedure: PARTIAL COLECTOMY -- sigmoid;  Surgeon: Olean Ree, MD;  Location: [facility] ORS;  Service: General;  Laterality: N/A;   PERIPHERAL VASCULAR THROMBECTOMY Right 10/07/2022   Procedure: PERIPHERAL VASCULAR THROMBECTOMY;  Surgeon: Algernon Huxley, MD;  Location: [place] [facility];  Service: Cardiovascular;  Laterality: Right;   PERIPHERAL VASCULAR THROMBECTOMY N/A 10/27/2022   Procedure: PERIPHERAL VASCULAR THROMBECTOMY;  Surgeon: Algernon Huxley, MD;  Location: [place] [facility];  Service: Cardiovascular;  Laterality: N/A;   PERIPHERAL VASCULAR THROMBECTOMY Right 11/07/2022   Procedure: PERIPHERAL VASCULAR THROMBECTOMY;  Surgeon: Algernon Huxley, MD;  Location: [place] [facility];  Service: Cardiovascular;  Laterality: Right;   PERIPHERAL VASCULAR THROMBECTOMY N/A 11/09/2022   Procedure: PERIPHERAL VASCULAR THROMBECTOMY;  Surgeon: Algernon Huxley, MD;  Location: [place] [facility];  Service: Cardiovascular;  Laterality: N/A;  Inferior Vena Cava Thrombectomy   SPINE SURGERY     TAKE DOWN OF INTESTINAL FISTULA N/A 02/11/2020   Procedure: TAKE DOWN OF INTESTINAL FISTULA -- colovesical fistula;  Surgeon: Hampton Abbot,  Jacqulyn Bath, MD;  Location: [facility] ORS;  Service: General;  Laterality: N/A;   TRACHEAL SURGERY      Family History  Problem Relation Age of Onset   Prostate cancer Neg Hx    Bladder Cancer Neg Hx    Kidney cancer Neg Hx     Social History:  reports that he has never smoked. He has never used smokeless tobacco. He reports that he does not drink alcohol and does not use drugs.  Allergies:  Allergies   Allergen Reactions   Penicillins Swelling   Shrimp [Shellfish Allergy] Hives   Cephalosporins Hives and Itching   Cisatracurium Other (See Comments)    Other reaction(s): Unknown Other reaction(s): Unknown    Heparin Hives and Swelling    Patient's daughter reports patient had swelling/hives during hospital admission in [place]. They were on several medications at once so she is unsure whether it was VANCOMYCIN that caused this reaction or another concomitant medication.    Rocuronium Other (See Comments)    Other reaction(s): Unknown Other reaction(s): Unknown    Vancomycin Hives and Swelling    Patient's daughter reports patient had swelling/hives during hospital admission in [place]. They were on several medications at once so she is unsure whether it was VANCOMYCIN that caused this reaction or another concomitant medication.    Wound Dressings Rash    Tegaderm     Medications: I have reviewed the patient's current medications.  Results for orders placed or performed during the hospital encounter of 11/27/22 (from the past 48 hour(s))  CBC with Differential     Status: Abnormal   Collection Time: 11/27/22  2:42 PM  Result Value Ref Range   WBC 5.2 4.0 - 10.5 K/uL   RBC 3.10 (L) 4.22 - 5.81 MIL/uL   Hemoglobin 8.3 (L) 13.0 - 17.0 g/dL   HCT 28.0 (L) 39.0 - 52.0 %   MCV 90.3 80.0 - 100.0 fL   MCH 26.8 26.0 - 34.0 pg   MCHC 29.6 (L) 30.0 - 36.0 g/dL   RDW 15.5 11.5 - 15.5 %   Platelets 236 150 - 400 K/uL   nRBC 0.0 0.0 - 0.2 %   Neutrophils Relative % 56 %   Neutro Abs 2.9 1.7 - 7.7 K/uL   Lymphocytes Relative 26 %   Lymphs Abs 1.4 0.7 - 4.0 K/uL   Monocytes Relative 10 %   Monocytes Absolute 0.5 0.1 - 1.0 K/uL   Eosinophils Relative 7 %   Eosinophils Absolute 0.3 0.0 - 0.5 K/uL   Basophils Relative 1 %   Basophils Absolute 0.1 0.0 - 0.1 K/uL   Immature Granulocytes 0 %   Abs Immature Granulocytes 0.01 0.00 - 0.07 K/uL    Comment: Performed at [facility], [address] [place], [place] [address]  Comprehensive metabolic panel     Status: Abnormal   Collection Time: 11/27/22  2:42 PM  Result Value Ref Range   Sodium 139 135 - 145 mmol/L   Potassium 3.8 3.5 - 5.1 mmol/L   Chloride 110 98 - 111 mmol/L   CO2 21 (L) 22 - 32 mmol/L   Glucose, Bld 121 (H) 70 - 99 mg/dL    Comment: Glucose reference range applies only to samples taken after fasting for at least 8 hours.   BUN 17 8 - 23 mg/dL   Creatinine, Ser 0.93 0.61 - 1.24 mg/dL   Calcium 8.6 (L) 8.9 - 10.3 mg/dL   Total Protein 6.8 6.5 - 8.1 g/dL  Albumin 3.7 3.5 - 5.0 g/dL   AST 14 (L) 15 - 41 U/L   ALT 12 0 - 44 U/L   Alkaline Phosphatase 79 38 - 126 U/L   Total Bilirubin 0.6 0.3 - 1.2 mg/dL   GFR, Estimated >60 >60 mL/min    Comment: (NOTE) Calculated using the CKD-EPI Creatinine Equation (2021)    Anion gap 8 5 - 15    Comment: Performed at [facility], [address] [place], [place] [address]  D-dimer, quantitative     Status: Abnormal   Collection Time: 11/27/22  2:42 PM  Result Value Ref Range   D-Dimer, Quant 2.67 (H) 0.00 - 0.50 ug/mL-FEU    Comment: (NOTE) At the manufacturer cut-off value of 0.5 g/mL FEU, this assay has a negative predictive value of 95-100%.This assay is intended for use in conjunction with a clinical pretest probability (PTP) assessment model to exclude pulmonary embolism (PE) and deep venous thrombosis (DVT) in outpatients suspected of PE or DVT. Results should be correlated with clinical presentation. Performed at [facility], [address] [place], [place] [address]     US Venous Img Lower Bilateral  Result Date: 11/27/2022 CLINICAL DATA:  History of DVT. Complains of bilateral lower extremity swelling. EXAM: BILATERAL LOWER EXTREMITY VENOUS DOPPLER ULTRASOUND TECHNIQUE: Gray-scale sonography with graded compression, as well as color Doppler and duplex ultrasound were performed to evaluate the lower extremity deep venous  systems from the level of the common femoral vein and including the common femoral, femoral, profunda femoral, popliteal and calf veins including the posterior tibial, peroneal and gastrocnemius veins when visible. The superficial great saphenous vein was also interrogated. Spectral Doppler was utilized to evaluate flow at rest and with distal augmentation maneuvers in the common femoral, femoral and popliteal veins. COMPARISON:  11/05/2022 FINDINGS: RIGHT LOWER EXTREMITY Common Femoral Vein: Occlusive thrombus identified Saphenofemoral Junction: No evidence of thrombus. Normal compressibility and flow on color Doppler imaging. Profunda Femoral Vein: Occlusive thrombus identified. Femoral Vein: Occlusive thrombus identified. Popliteal Vein: No evidence of thrombus. Normal compressibility, respiratory phasicity and response to augmentation. Calf Veins: No evidence of thrombus. Normal compressibility and flow on color Doppler imaging. Superficial Great Saphenous Vein: No evidence of thrombus. Normal compressibility. Venous Reflux:  None. Other Findings: Subcutaneous edema identified at the level of the calf veins. LEFT LOWER EXTREMITY Common Femoral Vein: Occlusive thrombus identified. Saphenofemoral Junction: Occlusive thrombus identified. Profunda Femoral Vein: Occlusive thrombus identified. Femoral Vein: Occlusive thrombus identified. Popliteal Vein: Occlusive thrombus identified. Calf Veins: No evidence of thrombus. Normal compressibility and flow on color Doppler imaging. Superficial Great Saphenous Vein: No evidence of thrombus. Normal compressibility. Venous Reflux:  None. Other Findings:  None. IMPRESSION: Exam is positive for bilateral lower extremity deep vein thromboses Critical Value/emergent results were called by telephone at the time of interpretation on 11/27/2022 at 4:27 pm to provider Dr. Joni Fears, who verbally acknowledged these results. Electronically Signed   By: Kerby Moors M.D.   On: 11/27/2022  16:27    Review of Systems Blood pressure 112/75, pulse 85, temperature 98.5 F (36.9 C), temperature source Oral, resp. rate 18, height '5\' 10"'$  (1.778 m), weight 90.7 kg, SpO2 98 %. Physical Exam  This is a very pleasant 72 year old gentleman in the ER hallway in no acute distress.  He is awake, alert, oriented.  Normocephalic atraumatic.  Upper extremities are symmetric in appearance.  His right IJ access site has healed nicely.  He has no accessory muscle use or tachypnea.  He has good capillary refill throughout.  Lower extremities are warm, well-perfused, but edema is present more so on the right than the left.  There is no phlegmasia per se.    Assessment/Plan: 1.  Bilateral lower extremity DVT by ultrasound with presumptive iliac and vena caval recurrent thrombosis. Plan will be for bivalirudin drip, with reimaging tomorrow.  It is unclear whether he has an anatomic reason for this to recur or a resistance to the pharmacology that would require adjustment.  Clinical course will be determined on venogram findings and intervention that is necessary.  Will tentatively make him n.p.o. after midnight for potential intervention tomorrow.  No suggestion of PE at this time, thus no CT was ordered.  I discussed this with him in detail and he is in agreement with the tentative plan.  Zara Chess 11/27/2022, 6:54 PM

## 2022-11-28 ENCOUNTER — Encounter
Admission: EM | Disposition: A | Discharge status: Home / Self Care | Payer: Self-pay | Source: Home / Self Care | Attending: Internal Medicine

## 2022-11-28 ENCOUNTER — Encounter: Payer: Self-pay | Admitting: Internal Medicine

## 2022-11-28 DIAGNOSIS — T82868A Thrombosis of vascular prosthetic devices, implants and grafts, initial encounter: Secondary | ICD-10-CM | POA: Diagnosis not present

## 2022-11-28 DIAGNOSIS — I82402 Acute embolism and thrombosis of unspecified deep veins of left lower extremity: Secondary | ICD-10-CM

## 2022-11-28 DIAGNOSIS — I82511 Chronic embolism and thrombosis of right femoral vein: Secondary | ICD-10-CM | POA: Diagnosis not present

## 2022-11-28 DIAGNOSIS — D509 Iron deficiency anemia, unspecified: Secondary | ICD-10-CM

## 2022-11-28 DIAGNOSIS — I82220 Acute embolism and thrombosis of inferior vena cava: Secondary | ICD-10-CM | POA: Diagnosis not present

## 2022-11-28 DIAGNOSIS — D500 Iron deficiency anemia secondary to blood loss (chronic): Secondary | ICD-10-CM

## 2022-11-28 DIAGNOSIS — G6289 Other specified polyneuropathies: Secondary | ICD-10-CM

## 2022-11-28 DIAGNOSIS — J849 Interstitial pulmonary disease, unspecified: Secondary | ICD-10-CM | POA: Diagnosis not present

## 2022-11-28 DIAGNOSIS — I82413 Acute embolism and thrombosis of femoral vein, bilateral: Secondary | ICD-10-CM | POA: Diagnosis present

## 2022-11-28 DIAGNOSIS — I82521 Chronic embolism and thrombosis of right iliac vein: Secondary | ICD-10-CM | POA: Diagnosis not present

## 2022-11-28 DIAGNOSIS — L299 Pruritus, unspecified: Secondary | ICD-10-CM | POA: Insufficient documentation

## 2022-11-28 DIAGNOSIS — I82422 Acute embolism and thrombosis of left iliac vein: Secondary | ICD-10-CM | POA: Diagnosis not present

## 2022-11-28 HISTORY — PX: PERIPHERAL VASCULAR THROMBECTOMY: CATH118306

## 2022-11-28 LAB — CBC
HCT: 26 % — ABNORMAL LOW (ref 39.0–52.0)
HCT: 28.4 % — ABNORMAL LOW (ref 39.0–52.0)
Hemoglobin: 7.6 g/dL — ABNORMAL LOW (ref 13.0–17.0)
Hemoglobin: 8.4 g/dL — ABNORMAL LOW (ref 13.0–17.0)
MCH: 26.6 pg (ref 26.0–34.0)
MCH: 26.7 pg (ref 26.0–34.0)
MCHC: 29.2 g/dL — ABNORMAL LOW (ref 30.0–36.0)
MCHC: 29.6 g/dL — ABNORMAL LOW (ref 30.0–36.0)
MCV: 90.9 fL (ref 80.0–100.0)
MCV: 90.2 fL (ref 80.0–100.0)
Platelets: 239 10*3/uL (ref 150–400)
Platelets: 233 10*3/uL (ref 150–400)
RBC: 2.86 MIL/uL — ABNORMAL LOW (ref 4.22–5.81)
RBC: 3.15 MIL/uL — ABNORMAL LOW (ref 4.22–5.81)
RDW: 15.7 % — ABNORMAL HIGH (ref 11.5–15.5)
RDW: 15.6 % — ABNORMAL HIGH (ref 11.5–15.5)
WBC: 3.8 10*3/uL — ABNORMAL LOW (ref 4.0–10.5)
WBC: 3.7 10*3/uL — ABNORMAL LOW (ref 4.0–10.5)
nRBC: 0 % (ref 0.0–0.2)
nRBC: 0 % (ref 0.0–0.2)

## 2022-11-28 LAB — COMPREHENSIVE METABOLIC PANEL
ALT: 13 U/L (ref 0–44)
AST: 12 U/L — ABNORMAL LOW (ref 15–41)
Albumin: 3.5 g/dL (ref 3.5–5.0)
Alkaline Phosphatase: 74 U/L (ref 38–126)
Anion gap: 7 (ref 5–15)
BUN: 13 mg/dL (ref 8–23)
CO2: 22 mmol/L (ref 22–32)
Calcium: 8.5 mg/dL — ABNORMAL LOW (ref 8.9–10.3)
Chloride: 112 mmol/L — ABNORMAL HIGH (ref 98–111)
Creatinine, Ser: 0.79 mg/dL (ref 0.61–1.24)
GFR, Estimated: >60 mL/min (ref >60)
Glucose, Bld: 134 mg/dL — ABNORMAL HIGH (ref 70–99)
Potassium: 3.9 mmol/L (ref 3.5–5.1)
Sodium: 141 mmol/L (ref 135–145)
Total Bilirubin: 0.6 mg/dL (ref 0.3–1.2)
Total Protein: 6.2 g/dL — ABNORMAL LOW (ref 6.5–8.1)

## 2022-11-28 LAB — IRON AND TIBC
Iron: 22 ug/dL — ABNORMAL LOW (ref 45–182)
Saturation Ratios: 6 % — ABNORMAL LOW (ref 17.9–39.5)
TIBC: 342 ug/dL (ref 250–450)
UIBC: 320 ug/dL

## 2022-11-28 LAB — APTT: aPTT: 56 seconds — ABNORMAL HIGH (ref 24–36)

## 2022-11-28 LAB — MRSA NEXT GEN BY PCR, NASAL: MRSA by PCR Next Gen: NOT DETECTED

## 2022-11-28 LAB — GLUCOSE, CAPILLARY: Glucose-Capillary: 139 mg/dL — ABNORMAL HIGH (ref 70–99)

## 2022-11-28 LAB — FERRITIN: Ferritin: 41 ng/mL (ref 24–336)

## 2022-11-28 SURGERY — PERIPHERAL VASCULAR THROMBECTOMY
Anesthesia: Moderate Sedation | Laterality: Bilateral

## 2022-11-28 MED ORDER — HYDROMORPHONE HCL 1 MG/ML IJ SOLN: 1.0000 mg | Freq: Once | INTRAMUSCULAR | Status: DC | PRN

## 2022-11-28 MED ORDER — FAMOTIDINE 20 MG PO TABS: 40.0000 mg | ORAL_TABLET | Freq: Once | ORAL | Status: AC | PRN

## 2022-11-28 MED ORDER — ONDANSETRON HCL 4 MG/2ML IJ SOLN: 4.0000 mg | Freq: Four times a day (QID) | INTRAMUSCULAR | Status: DC | PRN

## 2022-11-28 MED ORDER — SODIUM CHLORIDE 0.9 % IV BOLUS
INTRAVENOUS | Status: DC | PRN
  Administered 2022-11-28: 250 mL via INTRAVENOUS

## 2022-11-28 MED ORDER — FAMOTIDINE 20 MG PO TABS
ORAL_TABLET | ORAL | Status: AC
  Administered 2022-11-28: 40 mg via ORAL
  Filled 2022-11-28: qty 2

## 2022-11-28 MED ORDER — MIDAZOLAM HCL 2 MG/2ML IJ SOLN
INTRAMUSCULAR | Status: AC
  Filled 2022-11-28: qty 2

## 2022-11-28 MED ORDER — MORPHINE SULFATE (PF) 2 MG/ML IV SOLN: 2.0000 mg | INTRAVENOUS | Status: DC | PRN

## 2022-11-28 MED ORDER — FENTANYL CITRATE (PF) 100 MCG/2ML IJ SOLN
INTRAMUSCULAR | Status: DC | PRN
  Administered 2022-11-28: 25 ug via INTRAVENOUS
  Administered 2022-11-28: 50 ug via INTRAVENOUS

## 2022-11-28 MED ORDER — HYDROCERIN EX CREA
TOPICAL_CREAM | Freq: Two times a day (BID) | CUTANEOUS | Status: DC
  Filled 2022-11-28: qty 113

## 2022-11-28 MED ORDER — FENTANYL CITRATE PF 50 MCG/ML IJ SOSY: 12.5000 ug | PREFILLED_SYRINGE | Freq: Once | INTRAMUSCULAR | Status: DC | PRN

## 2022-11-28 MED ORDER — CIPROFLOXACIN IN D5W 400 MG/200ML IV SOLN
INTRAVENOUS | Status: AC
  Administered 2022-11-28: 400 mg via INTRAVENOUS
  Filled 2022-11-28: qty 200

## 2022-11-28 MED ORDER — ACETAMINOPHEN 325 MG PO TABS
650.0000 mg | ORAL_TABLET | Freq: Four times a day (QID) | ORAL | Status: DC | PRN
  Administered 2022-11-28: 650 mg via ORAL
  Filled 2022-11-28: qty 2

## 2022-11-28 MED ORDER — FENTANYL CITRATE PF 50 MCG/ML IJ SOSY
PREFILLED_SYRINGE | INTRAMUSCULAR | Status: AC
  Filled 2022-11-28: qty 1

## 2022-11-28 MED ORDER — IODIXANOL 320 MG/ML IV SOLN
INTRAVENOUS | Status: DC | PRN
  Administered 2022-11-28: 110 mL

## 2022-11-28 MED ORDER — LORATADINE 10 MG PO TABS
10.0000 mg | ORAL_TABLET | Freq: Every day | ORAL | Status: DC
  Administered 2022-11-28 – 2022-11-30 (×3): 10 mg via ORAL
  Filled 2022-11-28 (×3): qty 1

## 2022-11-28 MED ORDER — SODIUM CHLORIDE 0.9 % IV SOLN: INTRAVENOUS | Status: DC

## 2022-11-28 MED ORDER — CIPROFLOXACIN IN D5W 400 MG/200ML IV SOLN: 400.0000 mg | INTRAVENOUS | Status: AC

## 2022-11-28 MED ORDER — ALTEPLASE 2 MG IJ SOLR
INTRAMUSCULAR | Status: DC | PRN
  Administered 2022-11-28: 12 mg

## 2022-11-28 MED ORDER — METHYLPREDNISOLONE SODIUM SUCC 125 MG IJ SOLR: 125.0000 mg | Freq: Once | INTRAMUSCULAR | Status: AC | PRN

## 2022-11-28 MED ORDER — ONDANSETRON HCL 4 MG PO TABS: 4.0000 mg | ORAL_TABLET | Freq: Four times a day (QID) | ORAL | Status: DC | PRN

## 2022-11-28 MED ORDER — METHYLPREDNISOLONE SODIUM SUCC 125 MG IJ SOLR
INTRAMUSCULAR | Status: AC
  Administered 2022-11-28: 125 mg via INTRAVENOUS
  Filled 2022-11-28: qty 2

## 2022-11-28 MED ORDER — CHLORHEXIDINE GLUCONATE CLOTH 2 % EX PADS
6.0000 | MEDICATED_PAD | Freq: Every day | CUTANEOUS | Status: DC
  Administered 2022-11-28: 6 via TOPICAL

## 2022-11-28 MED ORDER — MIDAZOLAM HCL 2 MG/2ML IJ SOLN
INTRAMUSCULAR | Status: DC | PRN
  Administered 2022-11-28: 1 mg via INTRAVENOUS
  Administered 2022-11-28: 2 mg via INTRAVENOUS

## 2022-11-28 MED ORDER — DIPHENHYDRAMINE HCL 50 MG/ML IJ SOLN: 50.0000 mg | Freq: Once | INTRAMUSCULAR | Status: AC | PRN

## 2022-11-28 MED ORDER — DIPHENHYDRAMINE HCL 50 MG/ML IJ SOLN
INTRAMUSCULAR | Status: AC
  Administered 2022-11-28: 50 mg via INTRAVENOUS
  Filled 2022-11-28: qty 1

## 2022-11-28 MED ORDER — MIDAZOLAM HCL 2 MG/ML PO SYRP: 8.0000 mg | ORAL_SOLUTION | Freq: Once | ORAL | Status: DC | PRN

## 2022-11-28 SURGICAL SUPPLY — 27 items
BALLN ATG 12X6X80 (BALLOONS) ×1
BALLN ATG 14X6X80 (BALLOONS) ×1
BALLN DORADO 8X100X80 (BALLOONS) ×1
BALLOON ATG 12X6X80 (BALLOONS) IMPLANT
BALLOON ATG 14X6X80 (BALLOONS) IMPLANT
BALLOON DORADO 8X100X80 (BALLOONS) IMPLANT
CANISTER PENUMBRA ENGINE (MISCELLANEOUS) IMPLANT
CATH ANGIO 5F SIM1 100CM (CATHETERS) IMPLANT
CATH BEACON 5 .035 65 KMP TIP (CATHETERS) IMPLANT
CATH LIGHTNI FLASH 16XTORQ 100 (CATHETERS) IMPLANT
CATH LIGHTNING FLASH XTORQ 100 (CATHETERS) ×1
CATH NAVICROSS ANGLED 90CM (MICROCATHETER) IMPLANT
CATH TEMPO 5F RIM 65CM (CATHETERS) IMPLANT
CLOSURE PERCLOSE PROSTYLE (VASCULAR PRODUCTS) IMPLANT
DEVICE TORQUE .025-.038 (MISCELLANEOUS) IMPLANT
DRYSEAL FLEXSHEATH 16FR 33CM (SHEATH) ×1
GLIDEWIRE ADV .035X260CM (WIRE) IMPLANT
KIT ENCORE 26 ADVANTAGE (KITS) IMPLANT
KIT MICROPUNCTURE NIT STIFF (SHEATH) IMPLANT
NDL ENTRY 21GA 7CM ECHOTIP (NEEDLE) IMPLANT
NEEDLE ENTRY 21GA 7CM ECHOTIP (NEEDLE) ×1 IMPLANT
PACK ANGIOGRAPHY (CUSTOM PROCEDURE TRAY) ×1 IMPLANT
SHEATH BRITE TIP 10FRX11 (SHEATH) IMPLANT
SHEATH BRITE TIP 6FRX11 (SHEATH) IMPLANT
SHEATH DRYSEAL FLEX 16FR 33CM (SHEATH) IMPLANT
SUT MNCRL AB 4-0 PS2 18 (SUTURE) IMPLANT
WIRE GUIDERIGHT .035X150 (WIRE) IMPLANT

## 2022-11-28 NOTE — Progress Notes (Signed)
Just before tx to ICU, pt. C/o unable to dorsiflex right foot as well as left foot. Sent note to MD:MD states the pt. Had 5 procedures last month on right leg & that "there's not much else I can do for that leg right now." Called ICU RN Magda Paganini and informed RN of pt. Concerns & that MD was notified.

## 2022-11-28 NOTE — Assessment & Plan Note (Signed)
With hemoglobin of 7.6 today and procedure today may end up needing blood transfusion during this hospital stay.  Benefits and risks of blood transfusion explained to patient and wife.

## 2022-11-28 NOTE — Progress Notes (Signed)
  Progress Note   Patient: Russell [name] UXL:[id number] DOB: 1951/07/26 DOA: 11/27/2022     1 DOS: the patient was seen and examined on 11/28/2022   Brief hospital course: 72 year old man with past medical history of recurrent DVT, essential hypertension.  Patient was found to have bilateral DVT on ultrasound.  Patient was on Angiomax drip and will be taken for the to the vascular lab today for thrombolysis and thrombectomy.  Patient complains of shortness of breath when he does activity.  Having some swelling of the lower extremity and some itching of his lower extremities.  Assessment and Plan: Acute deep vein thrombosis (DVT) of left lower extremity [facility]) Patient also has chronic right lower extremity DVT.  Patient is on Angiomax drip and will go to the vascular lab today for thrombectomy and thrombolysis.  He states he has been compliant with his Eliquis.  ILD (interstitial lung disease) (HCC) Mild shortness of breath.  Not on oxygen.  Peripheral neuropathy On gabapentin  Iron deficiency anemia With hemoglobin of 7.6 today and procedure today may end up needing blood transfusion during this hospital stay.  Benefits and risks of blood transfusion explained to patient and wife.  Itching Eucerin cream bilateral lower extremities and Claritin.        Subjective: Patient does feel some shortness of breath with activity.  Complains of swelling and itching of his legs.  He has been scratching his legs and taking Claritin and Benadryl.  Found to have bilateral DVT.  He has been compliant with his Eliquis.  Physical Exam: Vitals:   11/28/22 0159 11/28/22 0658 11/28/22 1151 11/28/22 1256  BP:  (!) 95/56 101/86 99/72  Pulse:  85 71 85  Resp:  '18 14 19  '$ Temp: 98.6 F (37 C) 97.8 F (36.6 C)  (!) 97 F (36.1 C)  TempSrc: Oral Oral  Axillary  SpO2:  96% 92% 99%  Weight:      Height:       Physical Exam HENT:     Head: Normocephalic.     Mouth/Throat:     Pharynx: No oropharyngeal  exudate.  Eyes:     General: Lids are normal.     Comments: Pale conjunctiva  Cardiovascular:     Rate and Rhythm: Normal rate and regular rhythm.     Heart sounds: Normal heart sounds, S1 normal and S2 normal.  Pulmonary:     Breath sounds: No decreased breath sounds, wheezing, rhonchi or rales.  Abdominal:     Palpations: Abdomen is soft.     Tenderness: There is no abdominal tenderness.  Musculoskeletal:     Right lower leg: Swelling present.     Left lower leg: Swelling present.  Skin:    General: Skin is warm.     Comments: Secondary excoriations bilateral lower extremities  Neurological:     Mental Status: He is alert and oriented to person, place, and time.     Data Reviewed: Hemoglobin 7.6, creatinine 0.93  Family Communication: Spoke with wife at bedside  Disposition: Status is: Inpatient Remains inpatient appropriate because: For angiogram today  Planned Discharge Destination: Home    Time spent: 28 minutes  Author: Loletha Grayer, MD 11/28/2022 1:12 PM  For on call review [email or web address].

## 2022-11-28 NOTE — Op Note (Signed)
VEIN AND VASCULAR SURGERY   OPERATIVE NOTE   PRE-OPERATIVE DIAGNOSIS: extensive BLE and IVC DVT  POST-OPERATIVE DIAGNOSIS: same   PROCEDURE: 1.   US guidance for vascular access to bilateral popliteal veins 2.   Catheter placement into left external iliac vein from left popliteal approach and into the right common femoral vein from the right popliteal approach 3.   IVC gram and bilateral lower extremities venogram 4.   Catheter directed thrombolysis with 12 mg of tpa to the left iliac vein and IVC 5.   Mechanical thrombectomy to left iliac vein and IVC with the penumbra 16 lightening/flash device 6.   PTA of IVC in the left common and external iliac veins with 8, 12, and 14 mm balloon    SURGEON: Russell Pain, MD  ASSISTANT(S): none  ANESTHESIA: local with moderate conscious sedation for 76 minutes using 3 mg of Versed and 75 mcg of Fentanyl  ESTIMATED BLOOD LOSS: 100 cc  FINDING(S): 1.  The right common femoral, external, and common iliac veins were occluded and appeared to be somewhat more chronic and I was not able to cross this occlusion either from the right popliteal approach or an up and over approach from the left side. 2.  The left common iliac vein and distal IVC were occluded but were able to be recanalized with thrombectomy and angioplasty  SPECIMEN(S):  none  INDICATIONS:    Patient is a 72 y.o. male who presents with recurrent DVT.  Patient has marked leg swelling and [name].  Venous intervention is performed to reduce the symtpoms and avoid long term postphlebitic symptoms.    DESCRIPTION: After obtaining full informed written consent, the patient was brought back to the vascular suite and placed supine upon the table. Moderate conscious sedation was administered during a face to face encounter with the patient throughout the procedure with my supervision of the RN administering medicines and monitoring the patient's vital signs, pulse oximetry, telemetry and  mental status throughout from the start of the procedure until the patient was taken to the recovery room.  After obtaining adequate anesthesia, the patient was prepped and draped in the standard fashion.    The patient was then placed into the prone position.  First, I accessed the right popliteal vein under direct ultrasound guidance with a micropuncture needle and a permanent image was recorded.  Upsized to a 6 [place] sheath and then placed a ProGlide device and then upsized to a 10 [place] sheath.  The left popliteal vein was then accessed under direct ultrasound guidance without difficulty with a micropuncture needle and a permanent image was recorded.  I then upsized to an 10Fr sheath over a J wire.  I then advanced a Kumpe catheter and an advantage wire up the right side into the common femoral vein.  Imaging was performed showing large collaterals with occlusion of the common femoral vein, external and common iliac veins with only collateral flow more proximally.  Despite significant attempts, I was never able to cross this occlusion from the right popliteal approach.  I then turned my attention to the left side to try to approach the left sided DVT in the IVC.  A Kumpe catheter and an advantage wire were then advanced into the left external iliac vein and images were performed.  This showed occlusion of the left common iliac vein and IVC below and through the previously placed stent.  I was able to cross the thrombus and stenosis and advance into the IVC  above the stent which was patent.  I then used the Kumpe catheter and instilled 12 mg of tpa throughout the IVC and left iliac veins.  After this dwelled, the iliac vein and distal IVC were very tight and I ballooned this with 3 inflations with an 8 mm diameter by 10 cm length angioplasty balloon to open a channel.  I used the Penumbra Cat 16 lightening/flash catheter and evacuated about 100 cc of effluent with mechanical thrombectomy throughout the left  iliac veins and IVC.  This had mild improvement.  I then treated the left most proximal external and common iliac veins as well as the IVC up to and including previously placed IVC stent.  We first used a 12 and then a 14 mm balloon inflated from the IVC including the previously placed stent and below the previously placed stent and down into the left common and external iliac veins.  Following this, there was now a good channel flow through the iliac veins and no longer filling the collateral.  The IVC had been recanalized as well and the IVC was patent.  The I tried to cross up and over from the left side into the right iliac vein and I could get a catheter into the proximal right common iliac vein, but I could never cross the somewhat chronic occlusion.  I then elected to terminate the procedure.  Pro-glide devices were secured on both sides.  The sheath was removed and a dressing was placed.  She was taken to the recovery room in stable condition having tolerated the procedure well.    COMPLICATIONS: None  CONDITION: Stable  Russell [name] 11/28/2022 4:09 PM

## 2022-11-28 NOTE — Hospital Course (Signed)
72 year old man with past medical history of recurrent DVT, essential hypertension.  Patient was found to have bilateral DVT on ultrasound.  Patient was on Angiomax drip and will be taken for the to the vascular lab today for thrombolysis and thrombectomy.  Patient complains of shortness of breath when he does activity.  Having some swelling of the lower extremity and some itching of his lower extremities.  1/8.  Dr. Lucky Cowboy took to the vascular lab.  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  Left common iliac vein and distal IVC were occluded but were able to be recannulized with thrombectomy and angioplasty. 1/9.  Case discussed with Dr. Grayland Ormond and Dr. Lucky Cowboy and no further procedures are planned.  We are considering this in Eliquis failure and Dr. Grayland Ormond suggested switching over to Xarelto.  Will transfuse a unit of blood secondary to shortness of breath with activity and hemoglobin being 7.6.

## 2022-11-28 NOTE — Progress Notes (Signed)
Pt just arrived to unit Aox4, on RA, complaining of no pain, Lungs Clear, DP and radial pulses +2, popliteal sites clean, dry and intact, NSR, BP WNL, will continue to monitor.

## 2022-11-28 NOTE — Assessment & Plan Note (Signed)
On gabapentin.  

## 2022-11-28 NOTE — Assessment & Plan Note (Signed)
Eucerin cream bilateral lower extremities and Claritin.

## 2022-11-28 NOTE — Assessment & Plan Note (Signed)
Patient also has chronic right lower extremity DVT.  Patient is on Angiomax drip and will go to the vascular lab today for thrombectomy and thrombolysis.  He states he has been compliant with his Eliquis.

## 2022-11-28 NOTE — Assessment & Plan Note (Signed)
Mild shortness of breath.  Not on oxygen.

## 2022-11-28 NOTE — Consult Note (Signed)
ANTICOAGULATION CONSULT NOTE  Pharmacy Consult for bivalirudin drip Indication: DVT  Allergies  Allergen Reactions   Penicillins Swelling   Shrimp [Shellfish Allergy] Hives   Cephalosporins Hives and Itching   Cisatracurium Other (See Comments)    Other reaction(s): Unknown Other reaction(s): Unknown    Heparin Hives and Swelling    Patient's daughter reports patient had swelling/hives during hospital admission in [place]. They were on several medications at once so she is unsure whether it was VANCOMYCIN that caused this reaction or another concomitant medication.    Rocuronium Other (See Comments)    Other reaction(s): Unknown Other reaction(s): Unknown    Vancomycin Hives and Swelling    Patient's daughter reports patient had swelling/hives during hospital admission in [place]. They were on several medications at once so she is unsure whether it was VANCOMYCIN that caused this reaction or another concomitant medication.    Wound Dressings Rash    Tegaderm     Patient Measurements: Height: '5\' 10"'$  (177.8 cm) Weight: 90.7 kg (200 lb) IBW/kg (Calculated) : 73   Vital Signs: Temp: 98.6 F (37 C) (01/08 0159) Temp Source: Oral (01/08 0159) BP: 112/75 (01/07 1753) Pulse Rate: 85 (01/07 1753)  Labs: Recent Labs    11/27/22 1442 11/27/22 2146 11/28/22 0248  HGB 8.3*  --  7.6*  HCT 28.0*  --  26.0*  PLT 236  --  239  APTT  --  59* 56*  LABPROT  --  19.0*  --   INR  --  1.6*  --   HEPARINUNFRC  --  >1.10*  --   CREATININE 0.93  --   --      Estimated Creatinine Clearance: 81.3 mL/min (by C-G formula based on SCr of 0.93 mg/dL).   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  Patient has history or recurrent  DVTs in lower extremities on Eliquis at home.  Venous doppler showing bilateral DVTs.  Patient has reported heparin allergy, pharmacy consult to start bivalirudin infusion.  Hgb 8.3, plt 236 Baseline aPTT and PT/INR pending  Goal of Therapy:  aPTT 50-85 seconds Monitor platelets by anticoagulation protocol: Yes   Plan:  Start bivalirudin at 0.15 mg/kg/hr (13.605 mg/hr) 1/7: APTT @ 2146 = 59, therapeutic X 1  Will recheck aPTT in 4 hrs on 1/8 @ 0200.  1/8: APTT @ 0248 = 56, therapeutic X 2 - will continue pt on current rate and recheck aPTT on 1/9 with AM labs.  Daily CBC  Skila Rollins D, PharmD 11/28/2022,4:04 AM

## 2022-11-29 ENCOUNTER — Other Ambulatory Visit (HOSPITAL_COMMUNITY): Payer: Self-pay

## 2022-11-29 ENCOUNTER — Encounter: Payer: Self-pay | Admitting: Vascular Surgery

## 2022-11-29 DIAGNOSIS — I82402 Acute embolism and thrombosis of unspecified deep veins of left lower extremity: Secondary | ICD-10-CM | POA: Diagnosis not present

## 2022-11-29 DIAGNOSIS — I82413 Acute embolism and thrombosis of femoral vein, bilateral: Secondary | ICD-10-CM

## 2022-11-29 DIAGNOSIS — I9589 Other hypotension: Secondary | ICD-10-CM

## 2022-11-29 DIAGNOSIS — G6289 Other specified polyneuropathies: Secondary | ICD-10-CM | POA: Diagnosis not present

## 2022-11-29 DIAGNOSIS — J849 Interstitial pulmonary disease, unspecified: Secondary | ICD-10-CM | POA: Diagnosis not present

## 2022-11-29 DIAGNOSIS — D500 Iron deficiency anemia secondary to blood loss (chronic): Secondary | ICD-10-CM | POA: Diagnosis not present

## 2022-11-29 LAB — CBC
HCT: 25.4 % — ABNORMAL LOW (ref 39.0–52.0)
Hemoglobin: 7.6 g/dL — ABNORMAL LOW (ref 13.0–17.0)
MCH: 26.5 pg (ref 26.0–34.0)
MCHC: 29.9 g/dL — ABNORMAL LOW (ref 30.0–36.0)
MCV: 88.5 fL (ref 80.0–100.0)
Platelets: 234 10*3/uL (ref 150–400)
RBC: 2.87 MIL/uL — ABNORMAL LOW (ref 4.22–5.81)
RDW: 15.3 % (ref 11.5–15.5)
WBC: 6 10*3/uL (ref 4.0–10.5)
nRBC: 0 % (ref 0.0–0.2)

## 2022-11-29 LAB — PREPARE RBC (CROSSMATCH)

## 2022-11-29 LAB — APTT: aPTT: 57 seconds — ABNORMAL HIGH (ref 24–36)

## 2022-11-29 MED ORDER — FUROSEMIDE 10 MG/ML IJ SOLN
20.0000 mg | Freq: Once | INTRAMUSCULAR | Status: AC
  Administered 2022-11-29: 20 mg via INTRAVENOUS
  Filled 2022-11-29: qty 2

## 2022-11-29 MED ORDER — SODIUM CHLORIDE 0.9% IV SOLUTION: Freq: Once | INTRAVENOUS | Status: AC

## 2022-11-29 MED ORDER — OXYCODONE HCL 5 MG PO TABS: 5.0000 mg | ORAL_TABLET | ORAL | Status: DC | PRN

## 2022-11-29 MED ORDER — RIVAROXABAN 15 MG PO TABS
15.0000 mg | ORAL_TABLET | Freq: Two times a day (BID) | ORAL | Status: DC
  Administered 2022-11-29 – 2022-11-30 (×4): 15 mg via ORAL
  Filled 2022-11-29 (×4): qty 1

## 2022-11-29 MED ORDER — ACETAMINOPHEN 325 MG PO TABS
650.0000 mg | ORAL_TABLET | Freq: Once | ORAL | Status: AC
  Administered 2022-11-29: 650 mg via ORAL
  Filled 2022-11-29: qty 2

## 2022-11-29 MED ORDER — RIVAROXABAN 20 MG PO TABS: 20.0000 mg | ORAL_TABLET | Freq: Every day | ORAL | Status: DC

## 2022-11-29 NOTE — Progress Notes (Addendum)
Progress Note   Patient: Russell [name] CVE:[id number] DOB: [birth year] DOA: 11/27/2022     2 DOS: the patient was seen and examined on 11/29/2022   Brief hospital course: 72 year old man with past medical history of recurrent DVT, essential hypertension.  Patient was found to have bilateral DVT on ultrasound.  Patient was on Angiomax drip and will be taken for the to the vascular lab today for thrombolysis and thrombectomy.  Patient complains of shortness of breath when he does activity.  Having some swelling of the lower extremity and some itching of his lower extremities.  1/8.  Dr. Lucky Cowboy took to the vascular lab.  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  Left common iliac vein and distal IVC were occluded but were able to be recannulized with thrombectomy and angioplasty. 1/9.  Case discussed with Dr. Grayland Ormond and Dr. Lucky Cowboy and no further procedures are planned.  We are considering this in Eliquis failure and Dr. Grayland Ormond suggested switching over to Xarelto.  Will transfuse a unit of blood secondary to shortness of breath with activity and hemoglobin being 7.6.  Assessment and Plan: Acute deep vein thrombosis (DVT) of left lower extremity [facility]) Patient also has chronic right lower extremity DVT.  Brought to the vascular clamp for procedure by Dr. Lucky Cowboy on 11/28/2021.  The left common iliac vein and distal IVC were occluded but were able to be recannulized with thrombectomy and angioplasty.  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  No further procedures planned by vascular surgery.  Case discussed with Dr. 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  He recommended switching over to Xarelto.  Limited hypercoagulable workup sent off.  Iron deficiency anemia With hemoglobin of 7.6 and shortness of breath with exertion will transfuse 1 unit of packed red blood cells  today.  Benefits and risks of blood transfusion explained to patient.  Hypotension Will discontinue fluids and give 1 unit of packed red blood cells.  ILD (interstitial lung disease) (HCC) Mild shortness of breath.  Not on oxygen.  Peripheral neuropathy On gabapentin  Itching Eucerin cream bilateral lower extremities and Claritin.        Subjective: Patient feels a little bit better today.  Physical Exam: Vitals:   11/29/22 1245 11/29/22 1300 11/29/22 1400 11/29/22 1500  BP: 91/64 (!) '86/64 96/70 92/66 '$  Pulse: 91 87 86 80  Resp: 20 (!) 22 20 (!) 23  Temp: 98.1 F (36.7 C) 98.1 F (36.7 C) 98 F (36.7 C)   TempSrc: Oral Oral Oral   SpO2:  97% 97% 95%  Weight:      Height:       Physical Exam HENT:     Head: Normocephalic.     Mouth/Throat:     Pharynx: No oropharyngeal exudate.  Eyes:     General: Lids are normal.     Comments: Pale conjunctiva  Cardiovascular:     Rate and Rhythm: Normal rate and regular rhythm.     Heart sounds: Normal heart sounds, S1 normal and S2 normal.  Pulmonary:     Breath sounds: No decreased breath sounds, wheezing, rhonchi or rales.  Abdominal:     Palpations: Abdomen is soft.     Tenderness: There is no abdominal tenderness.  Musculoskeletal:     Right lower leg: Swelling present.     Left lower leg: Swelling present.  Skin:  General: Skin is warm.     Comments: Secondary excoriations bilateral lower extremities improved with moisturizing cream.  Neurological:     Mental Status: He is alert and oriented to person, place, and time.     Data Reviewed: Creatinine 0.79, hemoglobin 7.6  Family Communication: Left message for patient's wife and daughter on the phone  Disposition: Status is: Inpatient Remains inpatient appropriate because: We will transfuse 1 unit of packed red blood cells today for shortness of breath and low hemoglobin.  Patient's leg pain is better.  Convert from Angiomax drip to Xarelto.  Planned  Discharge Destination: Home    Time spent: 28 minutes  Author: Loletha Grayer, MD 11/29/2022 3:18 PM  For on call review [email or web address].

## 2022-11-29 NOTE — Interval H&P Note (Signed)
History and Physical Interval Note:  11/29/2022 9:00 AM  Russell [name]  has presented today for surgery, with the diagnosis of Bilateral leg DVT.  The various methods of treatment have been discussed with the patient and family. After consideration of risks, benefits and other options for treatment, the patient has consented to  Procedure(s): PERIPHERAL VASCULAR THROMBECTOMY (Bilateral) as a surgical intervention.  The patient's history has been reviewed, patient examined, no change in status, stable for surgery.  I have reviewed the patient's chart and labs.  Questions were answered to the patient's satisfaction.     [name]

## 2022-11-29 NOTE — Consult Note (Signed)
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  INTERVAL HISTORY: Patient is a 72 year old male who was recently diagnosed with DVT and placed on Eliquis.  He returned to the hospital several days ago with worsening leg swelling and found to have extensive bilateral lower extremity DVT despite being compliant with his anticoagulation.  He underwent thrombectomy earlier today.  He currently feels well.  He has no neurologic complaints.  He denies any recent fevers or illnesses.  He has a good appetite and denies weight loss.  He has no chest pain, shortness of breath, cough, or hemoptysis.  He denies any nausea, vomiting, constipation, or diarrhea.  He has no urinary complaints.  Patient offers no further specific complaints today.  REVIEW OF SYSTEMS:   Review of Systems  Constitutional: Negative.  Negative for fever, malaise/fatigue and weight loss.  Respiratory: Negative.  Negative for cough, hemoptysis and shortness of breath.   Cardiovascular:  Positive for leg swelling. Negative for chest pain.  Gastrointestinal: Negative.  Negative for abdominal pain.  Genitourinary: Negative.  Negative for dysuria.  Musculoskeletal: Negative.  Negative for back pain.  Skin: Negative.  Negative for rash.  Neurological: Negative.  Negative for dizziness, focal weakness, weakness and headaches.  Psychiatric/Behavioral: Negative.  The patient is not nervous/anxious.     As per HPI. Otherwise, a complete review of systems is negative.  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   Hyperlipidemia    Hypertension     PAST SURGICAL HISTORY: Past Surgical History:  Procedure Laterality  Date   BACK SURGERY     COLONOSCOPY WITH PROPOFOL N/A 11/05/2019   Procedure: COLONOSCOPY WITH PROPOFOL;  Surgeon: [name], MD;  Location: [facility] ENDOSCOPY;  Service: Gastroenterology;  Laterality: N/A;   IVC FILTER REMOVAL N/A 11/09/2022   Procedure: IVC FILTER REMOVAL;  Surgeon: Algernon Huxley, MD;  Location: [place] CV LAB;  Service: Cardiovascular;  Laterality: N/A;   PARTIAL COLECTOMY N/A 02/11/2020   Procedure: PARTIAL COLECTOMY -- sigmoid;  Surgeon: Olean Ree, MD;  Location: [facility] ORS;  Service: General;  Laterality: N/A;   PERIPHERAL VASCULAR THROMBECTOMY Right 10/07/2022   Procedure: PERIPHERAL VASCULAR THROMBECTOMY;  Surgeon: Algernon Huxley, MD;  Location: [place] [facility];  Service: Cardiovascular;  Laterality: Right;   PERIPHERAL VASCULAR THROMBECTOMY N/A 10/27/2022   Procedure: PERIPHERAL VASCULAR THROMBECTOMY;  Surgeon: Algernon Huxley, MD;  Location: [place] [facility];  Service: Cardiovascular;  Laterality: N/A;   PERIPHERAL VASCULAR THROMBECTOMY Right 11/07/2022   Procedure: PERIPHERAL VASCULAR THROMBECTOMY;  Surgeon: Algernon Huxley, MD;  Location: [place] [facility];  Service: Cardiovascular;  Laterality: Right;   PERIPHERAL VASCULAR THROMBECTOMY N/A 11/09/2022   Procedure: PERIPHERAL VASCULAR THROMBECTOMY;  Surgeon: Algernon Huxley, MD;  Location: [place] [facility];  Service: Cardiovascular;  Laterality: N/A;  Inferior Vena Cava Thrombectomy   PERIPHERAL VASCULAR THROMBECTOMY Bilateral 11/28/2022   Procedure: PERIPHERAL VASCULAR THROMBECTOMY;  Surgeon: Algernon Huxley, MD;  Location: [place] [facility];  Service: Cardiovascular;  Laterality: Bilateral;   SPINE SURGERY     TAKE DOWN OF INTESTINAL FISTULA N/A 02/11/2020   Procedure: TAKE DOWN OF INTESTINAL FISTULA -- colovesical fistula;  Surgeon: Olean Ree, MD;  Location: [facility] ORS;  Service: General;  Laterality: N/A;   TRACHEAL SURGERY      FAMILY HISTORY: Family History  Problem Relation Age of Onset   Prostate  cancer Neg Hx    Bladder Cancer Neg Hx    Kidney cancer Neg Hx     ADVANCED DIRECTIVES (Y/N):  '@ADVDIR'$ @  HEALTH MAINTENANCE: Social History   Tobacco Use   Smoking status: Never   Smokeless tobacco: Never  Vaping Use   Vaping Use: Never used  Substance Use Topics   Alcohol use: No   Drug use: No     Colonoscopy:  PAP:  Bone density:  Lipid panel:  Allergies  Allergen Reactions   Penicillins Swelling   Shrimp [Shellfish Allergy] Hives   Cephalosporins Hives and Itching   Cisatracurium Other (See Comments)    Other reaction(s): Unknown Other reaction(s): Unknown    Heparin Hives and Swelling    Patient's daughter reports patient had swelling/hives during hospital admission in [place]. They were on several medications at once so she is unsure whether it was VANCOMYCIN that caused this reaction or another concomitant medication.    Rocuronium Other (See Comments)    Other reaction(s): Unknown Other reaction(s): Unknown    Vancomycin Hives and Swelling    Patient's daughter reports patient had swelling/hives during hospital admission in [place]. They were on several medications at once so she is unsure whether it was VANCOMYCIN that caused this reaction or another concomitant medication.    Wound Dressings Rash    Tegaderm     Current Facility-Administered Medications  Medication Dose Route Frequency Provider Last Rate Last Admin   acetaminophen (TYLENOL) tablet 650 mg  650 mg Oral Q6H PRN Algernon Huxley, MD   650 mg at 11/28/22 3419   Chlorhexidine Gluconate Cloth 2 % PADS 6 each  6 each Topical Q0600 Loletha Grayer, MD   6 each at 11/28/22 1817   gabapentin (NEURONTIN) capsule 300 mg  300 mg Oral TID Algernon Huxley, MD   300 mg at 11/29/22 3790   hydrocerin (EUCERIN) cream   Topical BID Algernon Huxley, MD   Given at 11/29/22 0953   loratadine (CLARITIN) tablet 10 mg  10 mg Oral Daily Algernon Huxley, MD   10 mg at 11/29/22 0953   morphine (PF) 2 MG/ML injection 2 mg  2 mg  Intravenous Q2H PRN Algernon Huxley, MD       ondansetron [facility]) tablet 4 mg  4 mg Oral Q6H PRN Algernon Huxley, MD       Or   ondansetron (ZOFRAN) injection 4 mg  4 mg Intravenous Q6H PRN Algernon Huxley, MD       oxyCODONE (Oxy IR/ROXICODONE) immediate release tablet 5 mg  5 mg Oral Q4H PRN Loletha Grayer, MD       Rivaroxaban (XARELTO) tablet 15 mg  15 mg Oral BID WC Coulter, Carolyn, RPH   15 mg at 11/29/22 1218   Followed by   Derrill Memo ON 12/20/2022] rivaroxaban (XARELTO) tablet 20 mg  20 mg Oral Q supper Coulter, [place], RPH        OBJECTIVE: Vitals:   11/29/22 1300 11/29/22 1400  BP: (!) 86/64 96/70  Pulse: 87 86  Resp: (!) 22 20  Temp: 98.1 F (36.7 C) 98 F (36.7 C)  SpO2: 97% 97%     Body mass index is 29.99 kg/m.    ECOG FS:0 - Asymptomatic  General: Well-developed, well-nourished, no acute distress. Eyes:  Pink conjunctiva, anicteric sclera. HEENT: Normocephalic, moist mucous membranes. Lungs: No audible wheezing or coughing. Heart: Regular rate and rhythm. Abdomen: Soft, nontender, no obvious distention. Musculoskeletal: No edema, cyanosis, or clubbing. Neuro: Alert, answering all questions appropriately. Cranial nerves grossly intact. Skin: No rashes or petechiae noted. Psych: Normal affect. Lymphatics: No cervical, calvicular, axillary or inguinal LAD.   LAB RESULTS:  Lab Results  Component Value Date   NA 141 11/28/2022   K 3.9 11/28/2022   CL 112 (H) 11/28/2022   CO2 22 11/28/2022   GLUCOSE 134 (H) 11/28/2022   BUN 13 11/28/2022   CREATININE 0.79 11/28/2022   CALCIUM 8.5 (L) 11/28/2022   PROT 6.2 (L) 11/28/2022   ALBUMIN 3.5 11/28/2022   AST 12 (L) 11/28/2022   ALT 13 11/28/2022   ALKPHOS 74 11/28/2022   BILITOT 0.6 11/28/2022   GFRNONAA >60 11/28/2022   GFRAA >60 05/03/2020    Lab Results  Component Value Date   WBC 6.0 11/29/2022   NEUTROABS 2.9 11/27/2022   HGB 7.6 (L) 11/29/2022   HCT 25.4 (L) 11/29/2022   MCV 88.5 11/29/2022   PLT 234  11/29/2022     STUDIES: PERIPHERAL VASCULAR CATHETERIZATION  Result Date: 11/28/2022 See surgical note for result.  US Venous Img Lower Bilateral  Result Date: 11/27/2022 CLINICAL DATA:  History of DVT. Complains of bilateral lower extremity swelling. EXAM: BILATERAL LOWER EXTREMITY VENOUS DOPPLER ULTRASOUND TECHNIQUE: Gray-scale sonography with graded compression, as well as color Doppler and duplex ultrasound were performed to evaluate the lower extremity deep venous systems from the level of the common femoral vein and including the common femoral, femoral, profunda femoral, popliteal and calf veins including the posterior tibial, peroneal and gastrocnemius veins when visible. The superficial great saphenous vein was also interrogated. Spectral Doppler was utilized to evaluate flow at rest and with distal augmentation maneuvers in the common femoral, femoral and popliteal veins. COMPARISON:  11/05/2022 FINDINGS: RIGHT LOWER EXTREMITY Common Femoral Vein: Occlusive thrombus identified Saphenofemoral Junction: No evidence of thrombus. Normal compressibility and flow on color Doppler imaging. Profunda Femoral Vein: Occlusive thrombus identified. Femoral Vein: Occlusive thrombus identified. Popliteal Vein: No evidence of thrombus. Normal compressibility, respiratory phasicity and response to augmentation. Calf Veins: No evidence of thrombus. Normal compressibility and flow on color Doppler imaging. Superficial Great Saphenous Vein: No evidence of thrombus. Normal compressibility. Venous Reflux:  None. Other Findings: Subcutaneous edema identified at the level of the calf veins. LEFT LOWER EXTREMITY Common Femoral Vein: Occlusive thrombus identified. Saphenofemoral Junction: Occlusive thrombus identified. Profunda Femoral Vein: Occlusive thrombus identified. Femoral Vein: Occlusive thrombus identified. Popliteal Vein: Occlusive thrombus identified. Calf Veins: No evidence of thrombus. Normal compressibility  and flow on color Doppler imaging. Superficial Great Saphenous Vein: No evidence of thrombus. Normal compressibility. Venous Reflux:  None. Other Findings:  None. IMPRESSION: Exam is positive for bilateral lower extremity deep vein thromboses Critical Value/emergent results were called by telephone at the time of interpretation on 11/27/2022 at 4:27 pm to provider Dr. Joni Fears, who verbally acknowledged these results. Electronically Signed   By: Kerby Moors M.D.   On: 11/27/2022 16:27   PERIPHERAL VASCULAR CATHETERIZATION  Result Date: 11/09/2022 See surgical note for result.  CT VENOGRAM ABD/PELVIS/LOWER EXT BILAT  Result Date: 11/07/2022 CLINICAL DATA:  72 year old male with a history of DVT EXAM: CT VENOGRAM ABDOMEN AND PELVIS AND LOWER EXTREMITY BILATERAL TECHNIQUE: Axial sequential CT images of the abdomen pelvis were performed after administration of standard IV contrast bolus. Timing was acquired with attention  to the venous structures. Reformatted images in coronal and sagittal planes were performed on a separate workstation. RADIATION DOSE REDUCTION: This exam was performed according to the departmental dose-optimization program which includes automated exposure control, adjustment of the mA and/or kV according to patient size and/or use of iterative reconstruction technique. CONTRAST:  185m OMNIPAQUE IOHEXOL 350 MG/ML SOLN COMPARISON:  05/01/2020 FINDINGS: VASCULAR Arterial: Note that the evaluation of the arterial structures is limited secondary to the timing of the contrast bolus, optimized for venous structures per the protocol. Aorta: Atherosclerotic changes of the abdominal aorta. No pedunculated plaque or ulcerated plaque. Diameter of the infrarenal abdominal aorta estimated 19 mm. Celiac: Patent without significant calcified plaque. SMA: Patent without significant calcified plaque. Renals: - Right: Single right renal artery without significant calcified plaque at the origin. - Left:  Single left renal artery without significant calcified plaque at the origin. IMA: Inferior mesenteric artery is patent. Right lower extremity: Tortuosity of the right iliac system. Hypogastric artery patent. Minimal calcified atherosclerotic plaque. Common femoral artery patent. Proximal profunda femoris and SFA patent. Left lower extremity: Tortuosity of the left iliac system. Mild atherosclerotic plaque. Hypogastric arteries patent. Common femoral artery patent. Proximal profunda femoris and SFA patent. Venous: Systemic: IVC: Hepatic IVC unremarkable. IVC filter in place, which appears on prior plain film to be a retrievable [place]. The tines penetrate the wall of the IVC. Anterior time penetrates the second portion of the duodenum. Posterior tine appears imbedded within an osteophyte of the lumbar vertebral body. DVT extends from the IVC filter inferiorly through the lower IVC to the confluence of the iliac veins. Iliac veins: Occlusive DVT extends through both of the iliac system, from the IVC through the common and external iliac veins. Veins are expanded without calcification. Femoral veins: Central filling defect of the right common femoral vein. The most proximal femoral vein and profunda vein appear to have relatively normal contrast opacification. Central filling defect of the left common femoral vein. There appears to be filling defect within the most proximal imaged left femoral vein. Contrast opacifies the profunda system relatively within normal limits. Renal veins are patent Portal system: Hepatic veins patent. Left and right portal veins patent. Main portal vein patent. Splenic vein patent. SMV and IMV patent. Review of the MIP images confirms the above findings. NON-VASCULAR Lower chest: Architectural distortion of the bilateral lower lungs compatible with nonspecific fibrosis. No confluent airspace disease. Respiratory motion somewhat limits evaluation. Bronchiectasis. Hepatobiliary: Unremarkable  liver.  Unremarkable gallbladder. Pancreas: Unremarkable Spleen: Unremarkable. Adrenals/Urinary Tract: - Right adrenal gland: Unremarkable - Left adrenal gland: Unremarkable. - Right kidney: No hydronephrosis, nephrolithiasis, inflammation, or ureteral dilation. Focal cortical thinning of the lateral cortex of the lower pole right kidney. Simple cyst lateral cortex of the right kidney requiring no further follow-up. - Left Kidney: No hydronephrosis, nephrolithiasis, inflammation, or ureteral dilation. No focal lesion. - Urinary Bladder: Unremarkable. Stomach/Bowel: - Stomach: Hiatal hernia.  Otherwise unremarkable stomach. - Small bowel: Unremarkable - Appendix: Normal. - Colon: Diverticular disease without evidence of acute diverticulitis. Surgical anastomosis in the sigmoid colon of prior resection. Lymphatic: No adenopathy. Mesenteric: No free fluid or air. No mesenteric adenopathy. Reproductive: Unremarkable appearance of the pelvic organs. Other: Eventration of the midline abdomen, potentially surgical reconstruction of a ventral hernia versus laxity of the postoperative midline rectus abdominus. Musculoskeletal: Bridging Heterotopic ossification about the left hip. Surgical changes of prior left hip fixation. No aggressive lytic or sclerotic lesions identified. No acute displaced fracture. Surgical changes of thoracolumbar fixation,  incompletely imaged. IMPRESSION: Acute ilio caval and ilio femoral DVT, extending from a retrievable IVC filter inferiorly through the bilateral common femoral veins. CT is negative for DVT extension above the apex of the filter. The anterior tines appear to penetrate the duodenum, and there is a posterior tine embedded within anterior osteophyte of the lumbar spine. Aortic Atherosclerosis (ICD10-I70.0). Additional ancillary findings as above. Signed, Dulcy Fanny. Nadene Rubins, RPVI [facility] Electronically Signed   By:  Corrie Mckusick D.O.   On: 11/07/2022 10:02   PERIPHERAL VASCULAR CATHETERIZATION  Result Date: 11/07/2022 See surgical note for result.  VAS [place] LOWER EXTREMITY VENOUS (DVT)  Result Date: 11/07/2022  Lower Venous DVT Study Patient Name:  ANSELMO REIHL  Date of Exam:   11/04/2022 Medical Rec #: [id number]       Accession #:    [id number] Date of Birth: [birth year]       Patient Gender: M Patient Age:   39 years Exam Location:  [facility] Procedure:      VAS [place] LOWER EXTREMITY VENOUS (DVT) Referring Phys: [name] --------------------------------------------------------------------------------  Indications: Swelling, Pain, and Hx of RT LE DVT.  Risk Factors: DVT 10/25/2022: Rt LE DVT Surgery 10/07/2022: IVC Gram and Right Lower Extremity Venogram. Catheter thrombolysis with 8 mg of TPA. Mechanical Thrombectomy of the Right SFV,CFV EIV, CIV and IVC with the penumbra 16 device. 10/27/2022: IVC gram and Right Lower Extremity Venogram. Catheter directed thrombolysis with 8 mg of TPA to the Iliac and CFV. Mechanical Thrombectomy to the Right CFV, Iliac Veins and IVC with the Penumbra 16 lightening device. PTA of the CFV and Distal EIV with 12 mm balloon. Comparison Study: 10/25/2022 Performing Technologist: Almira Coaster RVS  Examination Guidelines: A complete evaluation includes B-mode imaging, spectral Doppler, color Doppler, and power Doppler as needed of all accessible portions of each vessel. Bilateral testing is considered an integral part of a complete examination. Limited examinations for reoccurring indications may be performed as noted. The reflux portion of the exam is performed with the patient in reverse Trendelenburg.  +---------+---------------+---------+-----------+----------+--------------+ RIGHT    CompressibilityPhasicitySpontaneityPropertiesThrombus Aging +---------+---------------+---------+-----------+----------+--------------+ CFV      None           No       No                                   +---------+---------------+---------+-----------+----------+--------------+ SFJ      None           Yes      Yes                                 +---------+---------------+---------+-----------+----------+--------------+ FV Prox  Partial        Yes      Yes                                 +---------+---------------+---------+-----------+----------+--------------+ FV Mid   Partial        Yes      Yes                                 +---------+---------------+---------+-----------+----------+--------------+ FV DistalFull  No       No                                  +---------+---------------+---------+-----------+----------+--------------+ PFV      None           No       No                                  +---------+---------------+---------+-----------+----------+--------------+ POP      Partial        Yes      Yes                                 +---------+---------------+---------+-----------+----------+--------------+ PTV      Full           Yes      Yes                                 +---------+---------------+---------+-----------+----------+--------------+ PERO     Full           Yes      Yes                                 +---------+---------------+---------+-----------+----------+--------------+ SSV      Full           Yes      Yes                                 +---------+---------------+---------+-----------+----------+--------------+   +----+---------------+---------+-----------+----------+--------------+ LEFTCompressibilityPhasicitySpontaneityPropertiesThrombus Aging +----+---------------+---------+-----------+----------+--------------+ CFV Full           Yes      Yes                                 +----+---------------+---------+-----------+----------+--------------+     Summary: RIGHT: - Findings consistent with acute deep vein thrombosis involving the right common femoral vein. 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  LEFT: - No evidence of common femoral vein obstruction.  *See table(s) above for measurements and observations. Electronically signed by [name] MD on 11/07/2022 at 8:54:37 AM.    Final    CT Angio Chest PE W and/or Wo Contrast  Result Date: 11/05/2022 CLINICAL DATA:  Right lower extremity swelling. Pulmonary embolism suspected. High probability. History of thoracic spine fusion surgery. EXAM: CT ANGIOGRAPHY CHEST WITH CONTRAST TECHNIQUE: Multidetector CT imaging of the chest was performed using the standard protocol during bolus administration of intravenous contrast. Multiplanar CT image reconstructions and MIPs were obtained to evaluate the vascular anatomy. RADIATION DOSE REDUCTION: This exam was performed according to the departmental dose-optimization program which includes automated exposure control, adjustment of the mA and/or kV according to patient size and/or use of iterative reconstruction technique. CONTRAST:  133m OMNIPAQUE IOHEXOL 350 MG/ML SOLN COMPARISON:  Most recent chest films PA Lat 10/25/2022, with prior CTA chest 10/06/2022, prior chest CT no contrast 06/11/2022. FINDINGS: Cardiovascular: Largest  moderately enlarged with left chamber predominance. There is no pericardial effusion. There is prominence of the superior pulmonary veins. The pulmonary trunk slightly prominent 3.3 cm, unchanged, indicating arterial hypertension but without visible arterial embolism. There is calcification in the superior mitral ring, scattered calcification LAD coronary artery. The great vessels and aorta are tortuous. There are mild scattered calcific plaques. There is no aortic or great vessel aneurysm, stenosis or dissection. Mediastinum/Nodes: Scattered bilateral borderline prominent hilar lymph nodes are unchanged in the  interval. Additional right and left paratracheal lymph nodes up to 8 mm in short axis, subcarinal nodes up to 10 mm short axis are unaltered as well. No new adenopathy. Small hiatal hernia. The thoracic esophagus, trachea and main bronchi are unremarkable. No thyroid mass. Lungs/Pleura: Diffuse bronchial thickening has increased, especially in the lower lobes. There is patchy increased opacity over portions of both lower lobes consistent with likely bronchopneumonia, specifically in the right lower lobe superior segment and in the posteromedial basal left lower lobe. Findings are superimposed on chronic interstitial lung disease with subpleural reticulation and ground-glass opacities predominating in the bases and with honeycombing in both basal lateral areas. There are bilateral trace pleural effusions. There does not appear to be superimposed interstitial edema at this time. Upper Abdomen: No ascites or mesenteric congestion. No acute findings. Mild hepatic steatosis. Cholelithiasis without evidence of cholecystitis. Musculoskeletal: Spray artifact arises from again noted dorsal fusion rod construct C7-T11, with T3-5 laminectomies, central corpectomies and cylinder graft imbedded hardware with mature fusion. Moderate thoracic kyphosis is unchanged. The ribcage is intact, with multiple healed fracture deformities posteriorly. There is multilevel thoracic spine bridging enthesopathy, noted from T3 down. Review of the MIP images confirms the above findings. IMPRESSION: 1. No evidence of arterial embolus. Slightly prominent pulmonary trunk indicating arterial hypertension. 2. Cardiomegaly with prominence of the superior pulmonary veins but no superimposed interstitial edema. 3. Aortic and coronary artery atherosclerosis. 4. Chronic interstitial lung disease with honeycombing in the lateral bases. 5. Bilateral lower lobe bronchopneumonia with increased bronchial thickening especially in the lower lobes. 6. Trace  pleural effusions. 7. Stable borderline prominent mediastinal and hilar lymph nodes. 8. Cholelithiasis without evidence of cholecystitis. 9. Hepatic steatosis. 10. Small hiatal hernia. 11. Postsurgical and degenerative changes of the spine with chronic thoracic kyphosis. Electronically Signed   By: Telford Nab M.D.   On: 11/05/2022 21:55   US Venous Img Lower Unilateral Right  Result Date: 11/05/2022 CLINICAL DATA:  History of DVT. Recent thrombectomy. Increased groin pain. EXAM: RIGHT LOWER EXTREMITY VENOUS DOPPLER ULTRASOUND TECHNIQUE: Gray-scale sonography with compression, as well as color and duplex ultrasound, were performed to evaluate the deep venous system(s) from the level of the common femoral vein through the popliteal and proximal calf veins. COMPARISON:  Right lower extremity venous Doppler ultrasound 10/25/2022 FINDINGS: VENOUS There is occlusive thrombus within the common femoral vein. There is nonocclusive thrombus within the profunda femoral vein and femoral vein proximally, but normal flow distally. There is normal flow and compressibility throughout the popliteal vein and saphenofemoral junction. There is also nonocclusive thrombus within the posterior tibial vein. The peroneal vein is not visualized. Limited views of the contralateral common femoral vein were unable to be obtained secondary to patient pain. OTHER None. Limitations: none IMPRESSION: 1. Occlusive thrombus within the right common femoral vein. 2. Nonocclusive thrombus within the profunda femoral vein and femoral vein proximally. 3. Nonocclusive thrombus within the posterior tibial vein. Electronically Signed   By: Ronney Asters M.D.   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  He also does not appear to have any additional transient  risk factors.  Hypercoagulable workup has been initiated by his primary hematologist, Dr. Janese Banks.  Patient has already been transitioned to Xarelto 20 mg daily.  No further intervention is needed.  Will arrange follow-up in the cancer center 1 to 2 weeks after discharge.  Appreciate consult, will follow.   Lloyd Huger, MD   11/29/2022 2:48 PM

## 2022-11-29 NOTE — Assessment & Plan Note (Signed)
Will discontinue fluids and give 1 unit of packed red blood cells.

## 2022-11-29 NOTE — Consult Note (Signed)
ANTICOAGULATION CONSULT NOTE - Initial Consult  Pharmacy Consult for Xarelto Indication: DVT  Allergies  Allergen Reactions   Penicillins Swelling   Shrimp [Shellfish Allergy] Hives   Cephalosporins Hives and Itching   Cisatracurium Other (See Comments)    Other reaction(s): Unknown Other reaction(s): Unknown    Heparin Hives and Swelling    Patient's daughter reports patient had swelling/hives during hospital admission in [place]. They were on several medications at once so she is unsure whether it was VANCOMYCIN that caused this reaction or another concomitant medication.    Rocuronium Other (See Comments)    Other reaction(s): Unknown Other reaction(s): Unknown    Vancomycin Hives and Swelling    Patient's daughter reports patient had swelling/hives during hospital admission in [place]. They were on several medications at once so she is unsure whether it was VANCOMYCIN that caused this reaction or another concomitant medication.    Wound Dressings Rash    Tegaderm     Patient Measurements: Height: '5\' 10"'$  (177.8 cm) Weight: 94.8 kg (208 lb 15.9 oz) IBW/kg (Calculated) : 73  Vital Signs: Temp: 98.2 F (36.8 C) (01/09 0400) Temp Source: Oral (01/09 0400) BP: 92/57 (01/09 0600) Pulse Rate: 100 (01/09 0600)  Labs: Recent Labs    11/27/22 1442 11/27/22 2146 11/28/22 0248 11/28/22 1927 11/29/22 0527  HGB 8.3*  --  7.6* 8.4* 7.6*  HCT 28.0*  --  26.0* 28.4* 25.4*  PLT 236  --  239 233 234  APTT  --  59* 56*  --  57*  LABPROT  --  19.0*  --   --   --   INR  --  1.6*  --   --   --   HEPARINUNFRC  --  >1.10*  --   --   --   CREATININE 0.93  --   --  0.79  --     Estimated Creatinine Clearance: 96.5 mL/min (by C-G formula based on SCr of 0.79 mg/dL).   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 male presenting with worsening leg swelling. PMH significant for recurrent DVTs, osetoarthritis, HTN, HLD. Patient was on Eliquis PTA per chart review. Venous doppler showed bilateral DVTs. Patient has reported heparin allergy, so he was started on bivalirudin infusion earlier this admission. Pharmacy has been consulted to dose rivaroxaban.   Plan:  Discontinue bivalirudin infusion Initiate rivaroxaban 15 mg PO BID x21 days then 20 mg PO nightly thereafter  Pharmacy will sign off and continue to monitor peripherally   Gretel Acre, PharmD PGY1 Pharmacy Resident 11/29/2022 11:52 AM

## 2022-11-29 NOTE — TOC Benefit Eligibility Note (Signed)
Patient Advocate Encounter  Insurance verification completed.    The patient is currently admitted and upon discharge could be taking Xarelto 20 mg.  The current 30 day co-pay is $0.00.   The patient is insured through [employer] Medicare Part D   Shatika Grinnell, CPHT Pharmacy Patient Advocate Specialist [facility] Patient Advocate Team Direct Number: [phone]  Fax: [phone]       

## 2022-11-29 NOTE — Consult Note (Addendum)
ANTICOAGULATION CONSULT NOTE  Pharmacy Consult for bivalirudin drip Indication: DVT  Allergies  Allergen Reactions   Penicillins Swelling   Shrimp [Shellfish Allergy] Hives   Cephalosporins Hives and Itching   Cisatracurium Other (See Comments)    Other reaction(s): Unknown Other reaction(s): Unknown    Heparin Hives and Swelling    Patient's daughter reports patient had swelling/hives during hospital admission in [place]. They were on several medications at once so she is unsure whether it was VANCOMYCIN that caused this reaction or another concomitant medication.    Rocuronium Other (See Comments)    Other reaction(s): Unknown Other reaction(s): Unknown    Vancomycin Hives and Swelling    Patient's daughter reports patient had swelling/hives during hospital admission in [place]. They were on several medications at once so she is unsure whether it was VANCOMYCIN that caused this reaction or another concomitant medication.    Wound Dressings Rash    Tegaderm     Patient Measurements: Height: '5\' 10"'$  (177.8 cm) Weight: 94.8 kg (208 lb 15.9 oz) IBW/kg (Calculated) : 73   Vital Signs: Temp: 98.2 F (36.8 C) (01/09 0400) Temp Source: Oral (01/09 0400) BP: 92/57 (01/09 0600) Pulse Rate: 100 (01/09 0600)  Labs: Recent Labs    11/27/22 1442 11/27/22 2146 11/28/22 0248 11/28/22 1927 11/29/22 0527  HGB 8.3*  --  7.6* 8.4* 7.6*  HCT 28.0*  --  26.0* 28.4* 25.4*  PLT 236  --  239 233 234  APTT  --  59* 56*  --  57*  LABPROT  --  19.0*  --   --   --   INR  --  1.6*  --   --   --   HEPARINUNFRC  --  >1.10*  --   --   --   CREATININE 0.93  --   --  0.79  --      Estimated Creatinine Clearance: 96.5 mL/min (by C-G formula based on SCr of 0.79 mg/dL).   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  Patient has history or recurrent DVTs in lower extremities on Eliquis at home.  Venous doppler showing bilateral DVTs.  Patient has reported heparin allergy, pharmacy consult to start bivalirudin infusion.  Hgb 8.3, plt 236 Baseline aPTT and PT/INR pending  Goal of Therapy:  aPTT 50-85 seconds Monitor platelets by anticoagulation protocol: Yes   Plan:  Start bivalirudin at 0.15 mg/kg/hr (13.605 mg/hr) 1/7: APTT @ 2146 = 59, therapeutic X 1  Will recheck aPTT in 4 hrs on 1/8 @ 0200.  1/8: APTT @ 0248 = 56, therapeutic X 2 - will continue pt on current rate and recheck aPTT on 1/9 with AM labs.  1/9: APTT @ 0527 = 57, therapeutic X 3 - will continue pt on current rate and recheck aPTT on 1/10 with AM labs.  Daily CBC  Renda Rolls, PharmD, [facility] 11/29/2022 6:24 AM

## 2022-11-29 NOTE — Progress Notes (Signed)
Progress Note    11/29/2022 11:15 AM 1 Day Post-Op  Subjective:  72 yo male presented to the ED with worsening swelling in legs. 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 Venous doppler showing bilateral DVTs. Now status post bilateral lower extremity thrombectomy.  Procedure listed below.  Patient states the swelling started about 4 to 5 days ago and progressively got worse.  Patient states he has been taking his Eliquis twice a day as prescribed.   Procedure:   1.   US guidance for vascular access to bilateral popliteal veins 2.   Catheter placement into left external iliac vein from left popliteal approach and into the right common femoral vein from the right popliteal approach 3.   IVC gram and bilateral lower extremities venogram 4.   Catheter directed thrombolysis with 12 mg of tpa to the left iliac vein and IVC 5.   Mechanical thrombectomy to left iliac vein and IVC with the penumbra 16 lightening/flash device 6.   PTA of IVC in the left common and external iliac veins with 8, 12, and 14 mm balloon   Vitals:   11/29/22 0500 11/29/22 0600  BP: (!) 78/57 (!) 92/57  Pulse: 87 100  Resp: 17 (!) 24  Temp:    SpO2: 95% 96%   Physical Exam: Cardiac:  RRR Lungs: Clear throughout all fields. Mild SOB not on Oxygen. Incisions: Left external iliac vein puncture site clean dry and intact no signs or symptoms of infection. Extremities: Left lower extremity remains swollen +4 edema.  Warm to the touch this morning with palpable PT pulses.  Lower extremity remains swollen +4 edema.  Leg cool to the touch with weak PT pulse. Abdomen: Bowel sounds all 4 quadrants soft nontender nondistended Neurologic: Alert and oriented x 3 to person place and time.  CBC    Component Value Date/Time   WBC 6.0 11/29/2022 0527   RBC 2.87 (L) 11/29/2022 0527   HGB 7.6 (L) 11/29/2022 0527   HGB 14.9 10/14/2019 1424   HCT 25.4 (L) 11/29/2022 0527   HCT 45.9  10/14/2019 1424   PLT 234 11/29/2022 0527   PLT 208 10/14/2019 1424   MCV 88.5 11/29/2022 0527   MCV 91 10/14/2019 1424   MCH 26.5 11/29/2022 0527   MCHC 29.9 (L) 11/29/2022 0527   RDW 15.3 11/29/2022 0527   RDW 13.9 10/14/2019 1424   LYMPHSABS 1.4 11/27/2022 1442   LYMPHSABS 2.0 10/14/2019 1424   MONOABS 0.5 11/27/2022 1442   EOSABS 0.3 11/27/2022 1442   EOSABS 0.2 10/14/2019 1424   BASOSABS 0.1 11/27/2022 1442   BASOSABS 0.0 10/14/2019 1424    BMET    Component Value Date/Time   NA 141 11/28/2022 1927   NA 143 10/14/2019 1424   K 3.9 11/28/2022 1927   CL 112 (H) 11/28/2022 1927   CO2 22 11/28/2022 1927   GLUCOSE 134 (H) 11/28/2022 1927   BUN 13 11/28/2022 1927   BUN 16 10/14/2019 1424   CREATININE 0.79 11/28/2022 1927   CALCIUM 8.5 (L) 11/28/2022 1927   GFRNONAA >60 11/28/2022 1927   GFRAA >60 05/03/2020 0604    INR    Component Value Date/Time   INR 1.6 (H) 11/27/2022 2146     Intake/Output Summary (Last 24 hours) at 11/29/2022 1115 Last data filed at 11/29/2022 0718 Gross per 24 hour  Intake 1853.04 ml  Output 1275 ml  Net 578.04 ml     Assessment/Plan:  72  y.o. male is s/p bilateral lower extremity thrombectomy. 1 Day Post-Op  On examination this morning patient resting comfortably in bed eating breakfast.  Continues to have bilateral lower extremity swelling.  Plus for the right side +3 to left side.  He has a palpable posttibial pulse on his left lower extremity.  Very weak right lower extremity PT pulse.  Left leg is warm to touch this morning while right leg remains cool to touch.  Patient remains on Angiomax drip this morning due to suspected heparin allergy.  Left popliteal incision site has dressing on it which is clean dry and intact.  No signs or symptoms of infection to note.  Plan: Recommend a hematology consult for workup for antiphospholipid syndrome.  Patient has now had multiple DVTs to bilateral lower extremities even after IVC filter was  removed.  Patient may not be responding to either his previous use of Xarelto or his current use of Eliquis.  Patient will need to be discharged home on some type of anticoagulation therapy that we will prevent any further DVT formation if possible. Recommend patient use compression socks, elevation of lower extremities and lymphedema pump going forward for bilateral lower leg extremity swelling he most likely will have going forward.  DVT prophylaxis: Angiomax drip.   [name] [facility] 11/29/2022 11:15 AM

## 2022-11-29 NOTE — Evaluation (Signed)
Physical Therapy Evaluation Patient Details Name: Russell [name] [name]: [id number] DOB: [birth year] Today's Date: 11/29/2022  History of Present Illness  Kaelon Weekes is a 21yoM who comes to [facility] on 11/27/22 for progression in pedal swelling and history of DVT. Pt seen by vascular surgery on 1/8 for thrombectomy, transitioned to from eliquis to [place] on 1/9, now s/p 1u PRBC. 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  Clinical Impression  Chart reviewed, RN consulted. PRBC recently completed. BP remains low, trending 80-90s/60s, but actually a bit higher at EOB compared to supine values. Pt denies any pain and is very much motivated to commence mobility, he explains his life long competitive spirit. Pt requires no assistance for mobility but standby assist due to some acute on chronic imbalance. Pt assisted with voiding in standing, then we transition to AMB in hallway with RW, he AMB 568f but must be convinced to stop here as his RN is in process of transferring him out of ICU to 1a. Pt denies any pain, dizziness, SOB while up AMB. He is not moving especially fast, nor is he AMB with SPC as per baseline, but given the course of illness and procedure he is moving quite well. Will continue to follow.      Recommendations for follow up therapy are one component of a multi-disciplinary discharge planning process, led by the attending physician.  Recommendations may be updated based on patient status, additional functional criteria and insurance authorization.  Follow Up Recommendations Outpatient PT      Assistance Recommended at Discharge Set up Supervision/Assistance  Patient can return home with the following  Assist for transportation;Assistance with cooking/housework;Help with stairs or ramp for entrance    Equipment Recommendations Rolling walker (2 wheels)  Recommendations for Other Services       Functional Status Assessment Patient has had a recent decline  in their functional status and demonstrates the ability to make significant improvements in function in a reasonable and predictable amount of time.     Precautions / Restrictions Precautions Precautions: Fall Restrictions Weight Bearing Restrictions: No      Mobility  Bed Mobility Overal bed mobility: Needs Assistance Bed Mobility: Supine to Sit     Supine to sit: Modified independent (Device/Increase time)          Transfers Overall transfer level: Needs assistance Equipment used: Rolling walker (2 wheels), 1 person hand held assist Transfers: Sit to/from Stand Sit to Stand: Supervision, Min guard           General transfer comment: altered strategies at baseine due to very limited knee flexion ROM on left side    Ambulation/Gait Ambulation/Gait assistance: Supervision Gait Distance (Feet): 500 Feet Assistive device: Rolling walker (2 wheels) Gait Pattern/deviations: WFL(Within Functional Limits)       General Gait Details: 3+ laps around ICU, pt very motivated to keep walking, has to be convinced to stop here so he can transfer out of ICU, has a bed waiting on 1a. VSS  Stairs            Wheelchair Mobility    Modified Rankin (Stroke Patients Only)       Balance                                             Pertinent Vitals/Pain Pain Assessment Pain Assessment: No/denies  pain    Home Living Family/patient expects to be discharged to:: Private residence Living Arrangements: Spouse/significant other Available Help at Discharge: Family;Available 24 hours/day Type of Home: House Home Access: Stairs to enter   [employer] of Steps: 1 Alternate Level Stairs-Number of Steps: flight, states he sleeps on couch on first floor Home Layout: Two level;Bed/bath upstairs Home Equipment: Cane - single point      Prior Function Prior Level of Function : Independent/Modified Independent             Mobility Comments:  uses cane at baseline ADLs Comments: independent     Hand Dominance   Dominant Hand: Right    Extremity/Trunk Assessment                Communication   Communication: Prefers language other than English  Cognition Arousal/Alertness: Awake/alert Behavior During Therapy: WFL for tasks assessed/performed Overall Cognitive Status: Within Functional Limits for tasks assessed                                          General Comments      Exercises     Assessment/Plan    PT Assessment Patient needs continued PT services  PT Problem List Decreased balance       PT Treatment Interventions DME instruction;Neuromuscular re-education;Gait training;Stair training;Functional mobility training;Therapeutic exercise;Therapeutic activities;Balance training    PT Goals (Current goals can be found in the Care Plan section)  Acute Rehab PT Goals Patient Stated Goal: return to home, regain his mobility PT Goal Formulation: With patient Time For Goal Achievement: 12/13/22 Potential to Achieve Goals: Good    Frequency Min 2X/week     Co-evaluation               AM-PAC PT "6 Clicks" Mobility  Outcome Measure Help needed turning from your back to your side while in a flat bed without using bedrails?: None Help needed moving from lying on your back to sitting on the side of a flat bed without using bedrails?: None Help needed moving to and from a bed to a chair (including a wheelchair)?: A Little Help needed standing up from a chair using your arms (e.g., wheelchair or bedside chair)?: A Little Help needed to walk in hospital room?: A Little Help needed climbing 3-5 steps with a railing? : A Little 6 Click Score: 20    End of Session Equipment Utilized During Treatment: Gait belt Activity Tolerance: No increased pain;Patient tolerated treatment well Patient left: with call bell/phone within reach;in chair (in transport chair at bedside, callbell  nearby) Nurse Communication: Mobility status PT Visit Diagnosis: Unsteadiness on feet (R26.81);Other abnormalities of gait and mobility (R26.89)    Time: 9242-6834 PT Time Calculation (min) (ACUTE ONLY): 31 min   Charges:   PT Evaluation $PT Eval Low Complexity: 1 Low PT Treatments $Therapeutic Activity: 8-22 mins      4:48 PM, 11/29/22 Etta Grandchild, PT, DPT Physical Therapist - [facility]  [phone] (Circle)    Shamell Suarez C 11/29/2022, 4:44 PM

## 2022-11-30 DIAGNOSIS — I82422 Acute embolism and thrombosis of left iliac vein: Secondary | ICD-10-CM

## 2022-11-30 LAB — CBC
HCT: 27.3 % — ABNORMAL LOW (ref 39.0–52.0)
Hemoglobin: 8.4 g/dL — ABNORMAL LOW (ref 13.0–17.0)
MCH: 27 pg (ref 26.0–34.0)
MCHC: 30.8 g/dL (ref 30.0–36.0)
MCV: 87.8 fL (ref 80.0–100.0)
Platelets: 235 10*3/uL (ref 150–400)
RBC: 3.11 MIL/uL — ABNORMAL LOW (ref 4.22–5.81)
RDW: 15.8 % — ABNORMAL HIGH (ref 11.5–15.5)
WBC: 4.4 10*3/uL (ref 4.0–10.5)
nRBC: 0 % (ref 0.0–0.2)

## 2022-11-30 LAB — TYPE AND SCREEN
ABO/RH(D): O POS
Antibody Screen: NEGATIVE
Unit division: 0

## 2022-11-30 LAB — BPAM RBC
Blood Product Expiration Date: 202402142359
ISSUE DATE / TIME: 202401091232
Unit Type and Rh: 5100

## 2022-11-30 MED ORDER — RIVAROXABAN 20 MG PO TABS: 20.0000 mg | ORAL_TABLET | Freq: Every day | ORAL | 0 refills | Status: DC

## 2022-11-30 NOTE — Plan of Care (Signed)

## 2022-11-30 NOTE — Progress Notes (Signed)
Report to [place] on 2C.  Transported via w/c in stable condition and released to staff on [place]

## 2022-11-30 NOTE — TOC Initial Note (Signed)
Transition of Care [facility]) - Initial/Assessment Note    Patient Details  Name: Russell [name] [name]: [id number] Date of Birth: 1951-01-25  Transition of Care [facility]) CM/SW Contact:    Beverly Sessions, RN Phone Number: 11/30/2022, 4:38 PM  Clinical Narrative:                      Patient transferred from ICU today Patient to discharge today Pharmacy has ran benefits and xarelto has $0 copay  RW referral made to [facility] with [employer] and to be delivered to room    Patient Goals and CMS Choice            Expected Discharge Plan and Services         Expected Discharge Date: 11/30/22                                    Prior Living Arrangements/Services                       Activities of Daily Living Home Assistive Devices/Equipment: Kasandra Knudsen (specify quad or straight) ADL Screening (condition at time of admission) Patient's cognitive ability adequate to safely complete daily activities?: Yes Is the patient deaf or have difficulty hearing?: No Does the patient have difficulty seeing, even when wearing glasses/contacts?: No Does the patient have difficulty concentrating, remembering, or making decisions?: No Patient able to express need for assistance with ADLs?: Yes Does the patient have difficulty dressing or bathing?: No Independently performs ADLs?: Yes (appropriate for developmental age) Does the patient have difficulty walking or climbing stairs?: Yes Weakness of Legs: Right Weakness of Arms/Hands: None  Permission Sought/Granted                  Emotional Assessment              Admission diagnosis:  DVT (deep venous thrombosis) (HCC) [I82.409] Acute deep vein thrombosis (DVT) of proximal end of both lower extremities ([place]) [I82.4Y3] DVT femoral (deep venous thrombosis) with thrombophlebitis, bilateral ([place]) [I82.413] Patient Active Problem List   Diagnosis Date Noted   Acute deep vein thrombosis (DVT) of left lower extremity ([place])  11/28/2022   Itching 11/28/2022   Iron deficiency anemia 11/28/2022   DVT femoral (deep venous thrombosis) with thrombophlebitis, bilateral ([place]) 11/28/2022   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 ([place]) 11/05/2022   Known medical problems 11/05/2022   Normocytic anemia 10/25/2022   ILD (interstitial lung disease) ([place]) 10/25/2022   Overweight (BMI 25.0-29.9) 04/08/2022   HTN (hypertension) 04/07/2022   Pulmonary embolism ([place]) 04/07/2022   MRSA infection 04/07/2022   Peripheral neuropathy 04/07/2022   Fever 04/07/2022   Campylobacter diarrhea 04/07/2022   Knee pain 09/22/2021   Dyslipidemia 09/05/2021   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   Peripheral polyneuropathy 02/10/2021   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   Primary osteoarthritis of left knee 01/06/2021   Diverticulitis of colon with perforation 05/03/2020   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   Carpal joint sprain 09/11/2019   Cervical spondylosis without myelopathy 09/11/2019   DDD (degenerative disc disease), cervical 09/11/2019   Lumbosacral stenosis 08/30/2019   Degeneration of lumbosacral intervertebral disc 08/30/2019   Unilateral vocal cord paralysis 08/03/2018   Hypotension 02/10/2018   Acute blood loss anemia 02/10/2018   S/P spinal fusion 01/17/2018   Other specified abnormal immunological findings in serum 11/27/2017   Postoperative or  surgical complication 01/19/3142   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[place]) 11/25/2017   Fluid collection at surgical site 11/25/2017   Delirium due to another medical condition 10/05/2017   BMI 26.0-26.9,adult 06/26/2017   GERD (gastroesophageal reflux disease) 05/09/2017   Chronic cough 11/21/2016   Heterotopic ossification 03/01/2016   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 pylori) infection 02/11/2014   Dyspepsia 02/05/2014   Neck pain 10/11/2013   Pain in joint, pelvic region and thigh 10/11/2013   Low back pain 10/11/2013   PCP:  Norm Parcel, MD Pharmacy:   [facility] [address] [place] - [place] [address] [place] [address] Phone: [phone] Fax: [phone]     Social Determinants of Health (SDOH) Social History: SDOH Screenings   Food Insecurity: No Food Insecurity (11/28/2022)  Housing: Low Risk  (11/28/2022)  Transportation Needs: No Transportation Needs (11/28/2022)  Utilities: Not At Risk (11/28/2022)  Tobacco Use: Low Risk  (11/29/2022)   SDOH Interventions:     Readmission Risk Interventions     No data to display

## 2022-11-30 NOTE — Discharge Summary (Signed)
Physician Discharge Summary   Patient: Russell [name] [name]: [id number] DOB: Dec 30, 1950  Admit date:     11/27/2022  Discharge date: 11/30/22  Discharge Physician: Carlyle Lipa   PCP: Norm Parcel, MD   Recommendations at discharge:   Primary care physician in 1 week Dr. Delight Hoh oncologist in 2 weeks Vascular surgery in 3 weeks  Discharge Diagnoses: Active Problems:   Acute deep vein thrombosis (DVT) of left lower extremity (HCC)   Iron deficiency anemia   Hypotension   ILD (interstitial lung disease) (HCC)   HTN (hypertension)   Peripheral neuropathy   History of pulmonary embolism   Itching   DVT femoral (deep venous thrombosis) with thrombophlebitis, bilateral [facility])   Hospital Course: 72 year old man with past medical history of recurrent DVT, essential hypertension.  Patient was found to have bilateral DVT on ultrasound.  Patient was on Angiomax drip and will be taken for the to the vascular lab today for thrombolysis and thrombectomy.  Patient complains of shortness of breath when he does activity.  Having some swelling of the lower extremity and some itching of his lower extremities.  1/8.  Dr. Lucky Cowboy took to the vascular lab.  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  Left common iliac vein and distal IVC were occluded but were able to be recannulized with thrombectomy and angioplasty. 1/9.  Case discussed with Dr. Grayland Ormond and Dr. Lucky Cowboy and no further procedures are planned.  We are considering this in Eliquis failure and Dr. Grayland Ormond suggested switching over to Xarelto.  Will transfuse a unit of blood secondary to shortness of breath with activity and hemoglobin being 7.6. 1/10.  After transfusion patient hemoglobin improved to 8.4.  Patient's pain in the legs have significantly decreased was ambulating freely in the room.  Per the recommendation of the oncologist this is going to be considered as failure of  Eliquis and patient is being discharged on Xarelto 20 mg daily.  Assessment and Plan: Acute deep vein thrombosis (DVT) of left lower extremity [facility]) Patient also has chronic right lower extremity DVT.  Brought to the vascular clamp for procedure by Dr. Lucky Cowboy on 11/28/2021.  The left common iliac vein and distal IVC were occluded but were able to be recannulized with thrombectomy and angioplasty.  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  No further procedures planned by vascular surgery.  Case discussed with Dr. 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  He recommended switching over to Xarelto.  Limited hypercoagulable workup sent off.  Iron deficiency anemia With hemoglobin of 7.6 and shortness of breath with exertion will transfuse 1 unit of packed red blood cells today.  Benefits and risks of blood transfusion explained to patient.  Hemoglobin at the time of discharge 8.4.  Hypotension Will discontinue fluids and give 1 unit of packed red blood cells.  Hypotension has resolved status posttransfusion last blood pressures in the systolic of 378H.  ILD (interstitial lung disease) (HCC) Mild shortness of breath.  Not on oxygen.  Peripheral neuropathy On gabapentin  Itching Eucerin cream bilateral lower extremities and Claritin.         Consultants: Vascular surgery Dr. Corene Cornea to, oncologist Dr. Delight Hoh Procedures performed: Clinical thrombectomy of the left iliac vein and IVC Disposition: Home Diet recommendation:  Discharge Diet Orders (From admission, onward)     Start  Ordered   11/30/22 0000  Diet - low sodium heart healthy        11/30/22 1556           Cardiac diet DISCHARGE MEDICATION: Allergies as of 11/30/2022       Reactions   Penicillins Swelling   Shrimp [shellfish Allergy] Hives   Cephalosporins Hives, Itching   Cisatracurium Other (See Comments)   Other  reaction(s): Unknown Other reaction(s): Unknown   Heparin Hives, Swelling   Patient's daughter reports patient had swelling/hives during hospital admission in [place]. They were on several medications at once so she is unsure whether it was VANCOMYCIN that caused this reaction or another concomitant medication.    Rocuronium Other (See Comments)   Other reaction(s): Unknown Other reaction(s): Unknown   Vancomycin Hives, Swelling   Patient's daughter reports patient had swelling/hives during hospital admission in [place]. They were on several medications at once so she is unsure whether it was VANCOMYCIN that caused this reaction or another concomitant medication.    Wound Dressings Rash   Tegaderm        Medication List     STOP taking these medications    apixaban 5 MG Tabs tablet Commonly known as: ELIQUIS   doxycycline 100 MG capsule Commonly known as: VIBRAMYCIN       TAKE these medications    Aveeno Active Naturals Daily Lotn Apply 1 Application topically in the morning and at bedtime.   gabapentin 300 MG capsule Commonly known as: NEURONTIN Take 300 mg by mouth 3 (three) times daily.   Iron (Ferrous Sulfate) 325 (65 Fe) MG Tabs Take 1 tablet by mouth daily at 12 noon. Can take any over-the-counter form of iron supplement.   lidocaine 5 % ointment Commonly known as: XYLOCAINE Apply 1 Application topically 3 (three) times daily as needed for mild pain.   rivaroxaban 20 MG Tabs tablet Commonly known as: XARELTO Take 1 tablet (20 mg total) by mouth daily with supper. Start taking on: December 20, 2022        Follow-up Information     Norm Parcel, MD Follow up in 5 day(s).   Specialty: Family Medicine Contact information: [address] [place] [address] [phone]         Sindy Guadeloupe, MD Follow up in 2 week(s).   Specialty: Oncology Contact information: [place] [address] [phone]          Algernon Huxley, MD Follow up in 3 week(s).   Specialties: Vascular Surgery, Radiology, Interventional Cardiology Contact information: [place] [address] [phone]         Norm Parcel, MD Follow up in 1 week(s).   Specialty: Family Medicine Contact information: [address] [place] [address] [phone]         Lloyd Huger, MD Follow up in 2 week(s).   Specialty: Oncology Contact information: [place] [address] [phone]                Discharge Exam: Danley Danker Weights   11/27/22 1436 11/28/22 1815  Weight: 90.7 kg 94.8 kg   Patient seen and examined at bedside today. Alert and oriented x 3 CVS: S1-S2 positive Respiratory: Bilateral clear and equal breath sounds   Condition at discharge: fair  The results of significant diagnostics from this hospitalization (including imaging, microbiology, ancillary and laboratory) are listed below  for reference.   Imaging Studies: PERIPHERAL VASCULAR CATHETERIZATION  Result Date: 11/28/2022 See surgical note for result.  US Venous Img Lower Bilateral  Result Date: 11/27/2022 CLINICAL DATA:  History of DVT. Complains of bilateral lower extremity swelling. EXAM: BILATERAL LOWER EXTREMITY VENOUS DOPPLER ULTRASOUND TECHNIQUE: Gray-scale sonography with graded compression, as well as color Doppler and duplex ultrasound were performed to evaluate the lower extremity deep venous systems from the level of the common femoral vein and including the common femoral, femoral, profunda femoral, popliteal and calf veins including the posterior tibial, peroneal and gastrocnemius veins when visible. The superficial great saphenous vein was also interrogated. Spectral Doppler was utilized to evaluate flow at rest and with distal augmentation maneuvers in the common femoral, femoral and popliteal veins. COMPARISON:  11/05/2022 FINDINGS: RIGHT LOWER  EXTREMITY Common Femoral Vein: Occlusive thrombus identified Saphenofemoral Junction: No evidence of thrombus. Normal compressibility and flow on color Doppler imaging. Profunda Femoral Vein: Occlusive thrombus identified. Femoral Vein: Occlusive thrombus identified. Popliteal Vein: No evidence of thrombus. Normal compressibility, respiratory phasicity and response to augmentation. Calf Veins: No evidence of thrombus. Normal compressibility and flow on color Doppler imaging. Superficial Great Saphenous Vein: No evidence of thrombus. Normal compressibility. Venous Reflux:  None. Other Findings: Subcutaneous edema identified at the level of the calf veins. LEFT LOWER EXTREMITY Common Femoral Vein: Occlusive thrombus identified. Saphenofemoral Junction: Occlusive thrombus identified. Profunda Femoral Vein: Occlusive thrombus identified. Femoral Vein: Occlusive thrombus identified. Popliteal Vein: Occlusive thrombus identified. Calf Veins: No evidence of thrombus. Normal compressibility and flow on color Doppler imaging. Superficial Great Saphenous Vein: No evidence of thrombus. Normal compressibility. Venous Reflux:  None. Other Findings:  None. IMPRESSION: Exam is positive for bilateral lower extremity deep vein thromboses Critical Value/emergent results were called by telephone at the time of interpretation on 11/27/2022 at 4:27 pm to provider Dr. Joni Fears, who verbally acknowledged these results. Electronically Signed   By: Kerby Moors M.D.   On: 11/27/2022 16:27   PERIPHERAL VASCULAR CATHETERIZATION  Result Date: 11/09/2022 See surgical note for result.  CT VENOGRAM ABD/PELVIS/LOWER EXT BILAT  Result Date: 11/07/2022 CLINICAL DATA:  72 year old male with a history of DVT EXAM: CT VENOGRAM ABDOMEN AND PELVIS AND LOWER EXTREMITY BILATERAL TECHNIQUE: Axial sequential CT images of the abdomen pelvis were performed after administration of standard IV contrast bolus. Timing was acquired with attention to the  venous structures. Reformatted images in coronal and sagittal planes were performed on a separate workstation. RADIATION DOSE REDUCTION: This exam was performed according to the departmental dose-optimization program which includes automated exposure control, adjustment of the mA and/or kV according to patient size and/or use of iterative reconstruction technique. CONTRAST:  163m OMNIPAQUE IOHEXOL 350 MG/ML SOLN COMPARISON:  05/01/2020 FINDINGS: VASCULAR Arterial: Note that the evaluation of the arterial structures is limited secondary to the timing of the contrast bolus, optimized for venous structures per the protocol. Aorta: Atherosclerotic changes of the abdominal aorta. No pedunculated plaque or ulcerated plaque. Diameter of the infrarenal abdominal aorta estimated 19 mm. Celiac: Patent without significant calcified plaque. SMA: Patent without significant calcified plaque. Renals: - Right: Single right renal artery without significant calcified plaque at the origin. - Left: Single left renal artery without significant calcified plaque at the origin. IMA: Inferior mesenteric artery is patent. Right lower extremity: Tortuosity of the right iliac system. Hypogastric artery patent. Minimal calcified atherosclerotic plaque. Common femoral artery patent. Proximal profunda femoris and SFA patent. Left lower extremity: Tortuosity of the left iliac system. Mild atherosclerotic plaque.  Hypogastric arteries patent. Common femoral artery patent. Proximal profunda femoris and SFA patent. Venous: Systemic: IVC: Hepatic IVC unremarkable. IVC filter in place, which appears on prior plain film to be a retrievable [place]. The tines penetrate the wall of the IVC. Anterior time penetrates the second portion of the duodenum. Posterior tine appears imbedded within an osteophyte of the lumbar vertebral body. DVT extends from the IVC filter inferiorly through the lower IVC to the confluence of the iliac veins. Iliac veins: Occlusive  DVT extends through both of the iliac system, from the IVC through the common and external iliac veins. Veins are expanded without calcification. Femoral veins: Central filling defect of the right common femoral vein. The most proximal femoral vein and profunda vein appear to have relatively normal contrast opacification. Central filling defect of the left common femoral vein. There appears to be filling defect within the most proximal imaged left femoral vein. Contrast opacifies the profunda system relatively within normal limits. Renal veins are patent Portal system: Hepatic veins patent. Left and right portal veins patent. Main portal vein patent. Splenic vein patent. SMV and IMV patent. Review of the MIP images confirms the above findings. NON-VASCULAR Lower chest: Architectural distortion of the bilateral lower lungs compatible with nonspecific fibrosis. No confluent airspace disease. Respiratory motion somewhat limits evaluation. Bronchiectasis. Hepatobiliary: Unremarkable liver.  Unremarkable gallbladder. Pancreas: Unremarkable Spleen: Unremarkable. Adrenals/Urinary Tract: - Right adrenal gland: Unremarkable - Left adrenal gland: Unremarkable. - Right kidney: No hydronephrosis, nephrolithiasis, inflammation, or ureteral dilation. Focal cortical thinning of the lateral cortex of the lower pole right kidney. Simple cyst lateral cortex of the right kidney requiring no further follow-up. - Left Kidney: No hydronephrosis, nephrolithiasis, inflammation, or ureteral dilation. No focal lesion. - Urinary Bladder: Unremarkable. Stomach/Bowel: - Stomach: Hiatal hernia.  Otherwise unremarkable stomach. - Small bowel: Unremarkable - Appendix: Normal. - Colon: Diverticular disease without evidence of acute diverticulitis. Surgical anastomosis in the sigmoid colon of prior resection. Lymphatic: No adenopathy. Mesenteric: No free fluid or air. No mesenteric adenopathy. Reproductive: Unremarkable appearance of the pelvic  organs. Other: Eventration of the midline abdomen, potentially surgical reconstruction of a ventral hernia versus laxity of the postoperative midline rectus abdominus. Musculoskeletal: Bridging Heterotopic ossification about the left hip. Surgical changes of prior left hip fixation. No aggressive lytic or sclerotic lesions identified. No acute displaced fracture. Surgical changes of thoracolumbar fixation, incompletely imaged. IMPRESSION: Acute ilio caval and ilio femoral DVT, extending from a retrievable IVC filter inferiorly through the bilateral common femoral veins. CT is negative for DVT extension above the apex of the filter. The anterior tines appear to penetrate the duodenum, and there is a posterior tine embedded within anterior osteophyte of the lumbar spine. Aortic Atherosclerosis (ICD10-I70.0). Additional ancillary findings as above. Signed, Dulcy Fanny. Nadene Rubins, RPVI [facility] Electronically Signed   By: Corrie Mckusick D.O.   On: 11/07/2022 10:02   PERIPHERAL VASCULAR CATHETERIZATION  Result Date: 11/07/2022 See surgical note for result.  VAS [place] LOWER EXTREMITY VENOUS (DVT)  Result Date: 11/07/2022  Lower Venous DVT Study Patient Name:  CAMPBELL KRAY  Date of Exam:   11/04/2022 Medical Rec #: [id number]       Accession #:    [id number] Date of Birth: 1950-12-12       Patient Gender: M Patient Age:   65 years Exam Location:  [facility] Procedure:      VAS [place] LOWER EXTREMITY VENOUS (DVT) Referring Phys: [name] --------------------------------------------------------------------------------  Indications: Swelling, Pain, and Hx of RT LE DVT.  Risk Factors: DVT 10/25/2022: Rt LE DVT Surgery 10/07/2022: IVC Gram and Right Lower Extremity Venogram. Catheter thrombolysis with 8 mg of TPA. Mechanical Thrombectomy of the Right SFV,CFV EIV, CIV and IVC with the penumbra 16 device. 10/27/2022: IVC gram and Right Lower  Extremity Venogram. Catheter directed thrombolysis with 8 mg of TPA to the Iliac and CFV. Mechanical Thrombectomy to the Right CFV, Iliac Veins and IVC with the Penumbra 16 lightening device. PTA of the CFV and Distal EIV with 12 mm balloon. Comparison Study: 10/25/2022 Performing Technologist: Almira Coaster RVS  Examination Guidelines: A complete evaluation includes B-mode imaging, spectral Doppler, color Doppler, and power Doppler as needed of all accessible portions of each vessel. Bilateral testing is considered an integral part of a complete examination. Limited examinations for reoccurring indications may be performed as noted. The reflux portion of the exam is performed with the patient in reverse Trendelenburg.  +---------+---------------+---------+-----------+----------+--------------+ RIGHT    CompressibilityPhasicitySpontaneityPropertiesThrombus Aging +---------+---------------+---------+-----------+----------+--------------+ CFV      None           No       No                                  +---------+---------------+---------+-----------+----------+--------------+ SFJ      None           Yes      Yes                                 +---------+---------------+---------+-----------+----------+--------------+ FV Prox  Partial        Yes      Yes                                 +---------+---------------+---------+-----------+----------+--------------+ FV Mid   Partial        Yes      Yes                                 +---------+---------------+---------+-----------+----------+--------------+ FV DistalFull           No       No                                  +---------+---------------+---------+-----------+----------+--------------+ PFV      None           No       No                                  +---------+---------------+---------+-----------+----------+--------------+ POP      Partial        Yes      Yes                                  +---------+---------------+---------+-----------+----------+--------------+ PTV      Full           Yes      Yes                                 +---------+---------------+---------+-----------+----------+--------------+  PERO     Full           Yes      Yes                                 +---------+---------------+---------+-----------+----------+--------------+ SSV      Full           Yes      Yes                                 +---------+---------------+---------+-----------+----------+--------------+   +----+---------------+---------+-----------+----------+--------------+ LEFTCompressibilityPhasicitySpontaneityPropertiesThrombus Aging +----+---------------+---------+-----------+----------+--------------+ CFV Full           Yes      Yes                                 +----+---------------+---------+-----------+----------+--------------+     Summary: RIGHT: - Findings consistent with acute deep vein thrombosis involving the right common femoral vein. - Findings consistent with chronic deep vein thrombosis involving the right common femoral vein, SF junction, right femoral vein, right popliteal vein, and right proximal profunda vein.  LEFT: - No evidence of common femoral vein obstruction.  *See table(s) above for measurements and observations. Electronically signed by [name] MD on 11/07/2022 at 8:54:37 AM.    Final    CT Angio Chest PE W and/or Wo Contrast  Result Date: 11/05/2022 CLINICAL DATA:  Right lower extremity swelling. Pulmonary embolism suspected. High probability. History of thoracic spine fusion surgery. EXAM: CT ANGIOGRAPHY CHEST WITH CONTRAST TECHNIQUE: Multidetector CT imaging of the chest was performed using the standard protocol during bolus administration of intravenous contrast. Multiplanar CT image reconstructions and MIPs were obtained to evaluate the vascular anatomy. RADIATION DOSE REDUCTION: This exam was performed according to the departmental  dose-optimization program which includes automated exposure control, adjustment of the mA and/or kV according to patient size and/or use of iterative reconstruction technique. CONTRAST:  120m OMNIPAQUE IOHEXOL 350 MG/ML SOLN COMPARISON:  Most recent chest films PA Lat 10/25/2022, with prior CTA chest 10/06/2022, prior chest CT no contrast 06/11/2022. FINDINGS: Cardiovascular: Largest moderately enlarged with left chamber predominance. There is no pericardial effusion. There is prominence of the superior pulmonary veins. The pulmonary trunk slightly prominent 3.3 cm, unchanged, indicating arterial hypertension but without visible arterial embolism. There is calcification in the superior mitral ring, scattered calcification LAD coronary artery. The great vessels and aorta are tortuous. There are mild scattered calcific plaques. There is no aortic or great vessel aneurysm, stenosis or dissection. Mediastinum/Nodes: Scattered bilateral borderline prominent hilar lymph nodes are unchanged in the interval. Additional right and left paratracheal lymph nodes up to 8 mm in short axis, subcarinal nodes up to 10 mm short axis are unaltered as well. No new adenopathy. Small hiatal hernia. The thoracic esophagus, trachea and main bronchi are unremarkable. No thyroid mass. Lungs/Pleura: Diffuse bronchial thickening has increased, especially in the lower lobes. There is patchy increased opacity over portions of both lower lobes consistent with likely bronchopneumonia, specifically in the right lower lobe superior segment and in the posteromedial basal left lower lobe. Findings are superimposed on chronic interstitial lung disease with subpleural reticulation and ground-glass opacities predominating in the bases and with honeycombing in both basal lateral areas. There are bilateral trace pleural effusions. There does not appear to be superimposed interstitial edema  at this time. Upper Abdomen: No ascites or mesenteric congestion.  No acute findings. Mild hepatic steatosis. Cholelithiasis without evidence of cholecystitis. Musculoskeletal: Spray artifact arises from again noted dorsal fusion rod construct C7-T11, with T3-5 laminectomies, central corpectomies and cylinder graft imbedded hardware with mature fusion. Moderate thoracic kyphosis is unchanged. The ribcage is intact, with multiple healed fracture deformities posteriorly. There is multilevel thoracic spine bridging enthesopathy, noted from T3 down. Review of the MIP images confirms the above findings. IMPRESSION: 1. No evidence of arterial embolus. Slightly prominent pulmonary trunk indicating arterial hypertension. 2. Cardiomegaly with prominence of the superior pulmonary veins but no superimposed interstitial edema. 3. Aortic and coronary artery atherosclerosis. 4. Chronic interstitial lung disease with honeycombing in the lateral bases. 5. Bilateral lower lobe bronchopneumonia with increased bronchial thickening especially in the lower lobes. 6. Trace pleural effusions. 7. Stable borderline prominent mediastinal and hilar lymph nodes. 8. Cholelithiasis without evidence of cholecystitis. 9. Hepatic steatosis. 10. Small hiatal hernia. 11. Postsurgical and degenerative changes of the spine with chronic thoracic kyphosis. Electronically Signed   By: Telford Nab M.D.   On: 11/05/2022 21:55   US Venous Img Lower Unilateral Right  Result Date: 11/05/2022 CLINICAL DATA:  History of DVT. Recent thrombectomy. Increased groin pain. EXAM: RIGHT LOWER EXTREMITY VENOUS DOPPLER ULTRASOUND TECHNIQUE: Gray-scale sonography with compression, as well as color and duplex ultrasound, were performed to evaluate the deep venous system(s) from the level of the common femoral vein through the popliteal and proximal calf veins. COMPARISON:  Right lower extremity venous Doppler ultrasound 10/25/2022 FINDINGS: VENOUS There is occlusive thrombus within the common femoral vein. There is nonocclusive  thrombus within the profunda femoral vein and femoral vein proximally, but normal flow distally. There is normal flow and compressibility throughout the popliteal vein and saphenofemoral junction. There is also nonocclusive thrombus within the posterior tibial vein. The peroneal vein is not visualized. Limited views of the contralateral common femoral vein were unable to be obtained secondary to patient pain. OTHER None. Limitations: none IMPRESSION: 1. Occlusive thrombus within the right common femoral vein. 2. Nonocclusive thrombus within the profunda femoral vein and femoral vein proximally. 3. Nonocclusive thrombus within the posterior tibial vein. Electronically Signed   By: Ronney Asters M.D.   On: 11/05/2022 17:27    Microbiology: Results for orders placed or performed during the hospital encounter of 11/27/22  MRSA Next Gen by PCR, Nasal     Status: None   Collection Time: 11/28/22  6:17 PM   Specimen: Nasal Mucosa; Nasal Swab  Result Value Ref Range Status   MRSA by PCR Next Gen NOT DETECTED NOT DETECTED Final    Comment: (NOTE) The GeneXpert MRSA Assay (FDA approved for NASAL specimens only), is one component of a comprehensive MRSA colonization surveillance program. It is not intended to diagnose MRSA infection nor to guide or monitor treatment for MRSA infections. Test performance is not FDA approved in patients less than 77 years old. Performed at [facility], [address] [place], [place] [address]     Labs: CBC: Recent Labs  Lab 11/27/22 1442 11/28/22 0248 11/28/22 1927 11/29/22 0527 11/30/22 0553  WBC 5.2 3.8* 3.7* 6.0 4.4  NEUTROABS 2.9  --   --   --   --   HGB 8.3* 7.6* 8.4* 7.6* 8.4*  HCT 28.0* 26.0* 28.4* 25.4* 27.3*  MCV 90.3 90.9 90.2 88.5 87.8  PLT 236 239 233 234 809   Basic Metabolic Panel: Recent Labs  Lab 11/27/22 1442  11/28/22 1927  NA 139 141  K 3.8 3.9  CL 110 112*  CO2 21* 22  GLUCOSE 121* 134*  BUN 17 13  CREATININE 0.93  0.79  CALCIUM 8.6* 8.5*   Liver Function Tests: Recent Labs  Lab 11/27/22 1442 11/28/22 1927  AST 14* 12*  ALT 12 13  ALKPHOS 79 74  BILITOT 0.6 0.6  PROT 6.8 6.2*  ALBUMIN 3.7 3.5   CBG: Recent Labs  Lab 11/28/22 1824  GLUCAP 139*    Discharge time spent: greater than 30 minutes.  Signed: Carlyle Lipa, MD [facility] 11/30/2022

## 2022-11-30 NOTE — Progress Notes (Signed)
Physical Therapy Treatment Patient Details Name: Russell [name] [name]: [id number] DOB: [birth year] Today's Date: 11/30/2022   History of Present Illness Russell [name] is a 40yoM who [facility] on [facility]/27/22 for progression in pedal swelling and history of DVT. Pt seen by vascular surgery on 1/8 for thrombectomy, transitioned to from eliquis to [place] on 1/9, now s/p 1u PRBC. 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    PT Comments    Patient received in bed, he is agreeable to PT session. Patient is independent with bed mobility and transfers. He is independently mobilizing in his room. He used RW for mobility in hall and requires supervision. Patient is preferring to use walker at this time vs. SPC. He will continue to benefit from skilled PT to improve independence and safety.      Recommendations for follow up therapy are one component of a multi-disciplinary discharge planning process, led by the attending physician.  Recommendations may be updated based on patient status, additional functional criteria and insurance authorization.  Follow Up Recommendations  No PT follow up     Assistance Recommended at Discharge PRN  Patient can return home with the following Assist for transportation;Assistance with cooking/housework;Help with stairs or ramp for entrance   Equipment Recommendations  Rolling walker (2 wheels)    Recommendations for Other Services       Precautions / Restrictions Precautions Precautions: Fall Restrictions Weight Bearing Restrictions: No     Mobility  Bed Mobility Overal bed mobility: Modified Independent Bed Mobility: Supine to Sit, Sit to Supine     Supine to sit: Modified independent (Device/Increase time) Sit to supine: Modified independent (Device/Increase time)        Transfers Overall transfer level: Modified independent Equipment used: None Transfers: Sit to/from Stand Sit to Stand: Supervision            General transfer comment: altered strategies at baseine due to very limited knee flexion ROM on left side    Ambulation/Gait Ambulation/Gait assistance: Supervision Gait Distance (Feet): 500 Feet Assistive device: Rolling walker (2 wheels) Gait Pattern/deviations: Step-through pattern, WFL(Within Functional Limits) Gait velocity: WNL     General Gait Details: Patient ambulated ~500 feet with RW. Appears to be close to baseline. He normally used a SPC at baseline, but prefers RW at this time.   Stairs             Wheelchair Mobility    Modified Rankin (Stroke Patients Only)       Balance Overall balance assessment: Modified Independent                                          Cognition Arousal/Alertness: Awake/alert Behavior During Therapy: WFL for tasks assessed/performed Overall Cognitive Status: Within Functional Limits for tasks assessed                                          Exercises      General Comments        Pertinent Vitals/Pain Pain Assessment Pain Assessment: No/denies pain    Home Living                          Prior Function  PT Goals (current goals can now be found in the care plan section) Acute Rehab PT Goals Patient Stated Goal: return to home, regain his mobility PT Goal Formulation: With patient Time For Goal Achievement: 12/13/22 Potential to Achieve Goals: Good Progress towards PT goals: Progressing toward goals    Frequency    Min 2X/week      PT Plan Discharge plan needs to be updated    Co-evaluation              AM-PAC PT "6 Clicks" Mobility   Outcome Measure  Help needed turning from your back to your side while in a flat bed without using bedrails?: None Help needed moving from lying on your back to sitting on the side of a flat bed without using bedrails?: None Help needed moving to and from a bed to a chair (including a wheelchair)?:  None Help needed standing up from a chair using your arms (e.g., wheelchair or bedside chair)?: None Help needed to walk in hospital room?: A Little Help needed climbing 3-5 steps with a railing? : A Little 6 Click Score: 22    End of Session Equipment Utilized During Treatment: Gait belt Activity Tolerance: Patient tolerated treatment well Patient left: in bed;with call bell/phone within reach Nurse Communication: Mobility status PT Visit Diagnosis: Unsteadiness on feet (R26.81);Other abnormalities of gait and mobility (R26.89)     Time: 0737-1062 PT Time Calculation (min) (ACUTE ONLY): 27 min  Charges:  $Gait Training: 23-37 mins                     Bodin Gorka, PT, GCS 11/30/22,3:52 PM

## 2022-11-30 NOTE — Progress Notes (Signed)
Progress Note    11/30/2022 8:35 PM 2 Days Post-Op  Subjective:  72 yo male presented to the ED with worsening swelling in legs. 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 Venous doppler showing bilateral DVTs. Now status post bilateral lower extremity thrombectomy.  Procedure listed below.  Patient states the swelling started about 4 to 5 days ago and progressively got worse.  Patient states he has been taking his Eliquis twice a day as prescribed.   Upon exam today I found Mr Cadieux resting comfortably in bed. His IV has been discontinued. He states he feels better and wishes to go home. He says both his lower extremities remain swollen but feel better. No complications overnight. Patients vitals all remain stable.    Vitals:   11/30/22 1002 11/30/22 1557  BP: 117/77 121/79  Pulse: 92 99  Resp: 20 (!) 22  Temp: 97.9 F (36.6 C) 98.2 F (36.8 C)  SpO2: 98% 97%   Physical Exam: Cardiac:  RRR Lungs:  Clear throughout bilaterally Incisions:   Extremities:  Left popliteal puncture site is clean dry and intact. No hematoma or seroma Abdomen:  Positive bowel sound soft, NT/ND.  Neurologic: AAOX3 Person, place and time.   CBC    Component Value Date/Time   WBC 4.4 11/30/2022 0553   RBC 3.11 (L) 11/30/2022 0553   HGB 8.4 (L) 11/30/2022 0553   HGB 14.9 10/14/2019 1424   HCT 27.3 (L) 11/30/2022 0553   HCT 45.9 10/14/2019 1424   PLT 235 11/30/2022 0553   PLT 208 10/14/2019 1424   MCV 87.8 11/30/2022 0553   MCV 91 10/14/2019 1424   MCH 27.0 11/30/2022 0553   MCHC 30.8 11/30/2022 0553   RDW 15.8 (H) 11/30/2022 0553   RDW 13.9 10/14/2019 1424   LYMPHSABS 1.4 11/27/2022 1442   LYMPHSABS 2.0 10/14/2019 1424   MONOABS 0.5 11/27/2022 1442   EOSABS 0.3 11/27/2022 1442   EOSABS 0.2 10/14/2019 1424   BASOSABS 0.1 11/27/2022 1442   BASOSABS 0.0 10/14/2019 1424    BMET    Component Value Date/Time   NA 141 11/28/2022 1927   NA 143  10/14/2019 1424   K 3.9 11/28/2022 1927   CL 112 (H) 11/28/2022 1927   CO2 22 11/28/2022 1927   GLUCOSE 134 (H) 11/28/2022 1927   BUN 13 11/28/2022 1927   BUN 16 10/14/2019 1424   CREATININE 0.79 11/28/2022 1927   CALCIUM 8.5 (L) 11/28/2022 1927   GFRNONAA >60 11/28/2022 1927   GFRAA >60 05/03/2020 0604    INR    Component Value Date/Time   INR 1.6 (H) 11/27/2022 2146     Intake/Output Summary (Last 24 hours) at 11/30/2022 2035 Last data filed at 11/30/2022 0500 Gross per 24 hour  Intake --  Output 2010 ml  Net -2010 ml     Assessment/Plan:  72 y.o. male is s/p IVC gram and bilateral lower extremities venogram with mechanical thrombectomy to left ileac vein and IVC. 2 Days Post-Op   Patient is recovering as expected. Swelling to bilateral lower extremities has decreased some. Right leg more swollen than left. Patient was transitioned from IV anticoagulant to PO Xarelto 15 MG in the AM and 20 mg in PM. Patient is able to walk the halls today with assistance. Patient wishes to go home. Vascular Surgery is okay with discharge today. Follow up in 4 weeks with Venous doppler ultrasound to left lower extremity. Patient also instructed to wear  compression socks all day everyday. Elevate both legs above the heart when sitting or lying down. Patient may benefit from lymphedema pump.   DVT prophylaxis:  xarelto as stated above.    [name] [facility] 11/30/2022 8:35 PM

## 2022-11-30 NOTE — Progress Notes (Signed)
Discharge instructions were reviewed with pt and family. Questions were answered and encourage. IV was taken out. Belongings were collected by pt's family.

## 2022-12-01 ENCOUNTER — Ambulatory Visit (INDEPENDENT_AMBULATORY_CARE_PROVIDER_SITE_OTHER): Payer: Medicare Other | Admitting: Nurse Practitioner

## 2022-12-01 ENCOUNTER — Encounter (INDEPENDENT_AMBULATORY_CARE_PROVIDER_SITE_OTHER): Payer: Self-pay | Admitting: Nurse Practitioner

## 2022-12-01 VITALS — BP 105/69 | HR 94 | Resp 16 | Wt 202.4 lb

## 2022-12-01 DIAGNOSIS — J849 Interstitial pulmonary disease, unspecified: Secondary | ICD-10-CM

## 2022-12-01 DIAGNOSIS — I82401 Acute embolism and thrombosis of unspecified deep veins of right lower extremity: Secondary | ICD-10-CM

## 2022-12-01 DIAGNOSIS — I10 Essential (primary) hypertension: Secondary | ICD-10-CM | POA: Diagnosis not present

## 2022-12-01 DIAGNOSIS — I890 Lymphedema, not elsewhere classified: Secondary | ICD-10-CM | POA: Diagnosis not present

## 2022-12-02 LAB — LUPUS ANTICOAGULANT PANEL
DRVVT: 74.3 s — ABNORMAL HIGH (ref 0.0–47.0)
PTT Lupus Anticoagulant: 41.2 s (ref 0.0–43.5)

## 2022-12-02 LAB — BETA-2-GLYCOPROTEIN I ABS, IGG/M/A
Beta-2 Glyco I IgG: <9 GPI IgG units (ref 0–20)
Beta-2-Glycoprotein I IgA: <9 GPI IgA units (ref 0–25)
Beta-2-Glycoprotein I IgM: <9 GPI IgM units (ref 0–32)

## 2022-12-02 LAB — HEX PHASE PHOSPHOLIPID REFLEX

## 2022-12-02 LAB — DRVVT MIX: dRVVT Mix: 58 s — ABNORMAL HIGH (ref 0.0–40.4)

## 2022-12-02 LAB — CARDIOLIPIN ANTIBODIES, IGG, IGM, IGA
Anticardiolipin IgA: <9 APL U/mL (ref 0–11)
Anticardiolipin IgG: <9 GPL U/mL (ref 0–14)
Anticardiolipin IgM: 19 MPL U/mL — ABNORMAL HIGH (ref 0–12)

## 2022-12-02 LAB — DRVVT CONFIRM: dRVVT Confirm: 1.3 ratio — ABNORMAL HIGH (ref 0.8–1.2)

## 2022-12-02 LAB — HEXAGONAL PHASE PHOSPHOLIPID: Hex Phosph Neut Test: 7 s (ref 0–11)

## 2022-12-07 LAB — FACTOR 5 LEIDEN

## 2022-12-12 ENCOUNTER — Encounter (INDEPENDENT_AMBULATORY_CARE_PROVIDER_SITE_OTHER): Payer: Self-pay | Admitting: Nurse Practitioner

## 2022-12-12 LAB — PROTHROMBIN GENE MUTATION

## 2022-12-12 NOTE — Progress Notes (Signed)
Subjective:    Patient ID: Russell [name], male    DOB: [birth year], 72 y.o.   [name]: [id number] Chief Complaint  Patient presents with   Follow-up    [facility] 3 week follow up    Russell [name] is a 72 year old male who returns following multiple recent vascular interventions.  The patient's first diagnosis of DVT was in 03/2022.  It was noted that at that time the patient took his initial pack of Eliquis but did not complete any further refills.  He subsequently returned to [facility] on 10/06/2022 with thrombectomy to the right lower extremity on 10/07/2022.  At that time the patient had extensive thrombus extending into the IVC which appeared to be chronic mostly in nature.  This was debulked as much as possible however the procedure had to be stopped due to concern for significant blood loss.  The patient patient was subsequently readmitted on 10/27/2022 with a subsequent debulking and was discovered that the IVC was occluded up to his previously placed IVC filter (status prior intervention the patient was unaware he had one).  At that time the patient notes that he was taking his Eliquis however there was concern that he may not have been at the time.  Discharged at this time with improvement in symptoms.  Patient was subsequently readmitted on 11/06/2022, with reintervention to the right lower extremity.  On 11/09/2022 the patient had retrieval of the IVC filter with subsequent stent placement to the IVC.  The patient was subsequently discharged and then readmitted once again with reintervention on 11/28/2022 to the left iliac and IVC.  It was noted that following this intervention the right iliac was chronically occluded.  Last 2 interventions the patient maintains that he has been taking his Eliquis as prescribed.  Today the patient notes that he still continues to have swelling in the right lower extremity and it is uncomfortable.  He also notes that he has been having some  shortness of breath noted with activity.  He was switched to Xarelto during this hospitalization and he notes that he has been taking as as prescribed without issue.  He has been utilizing medical grade compression stockings since prior to his first intervention.  He also continues to elevate his lower extremities and tries to remain active as instructed during his hospitalizations.    Review of Systems  Respiratory:  Positive for shortness of breath.   Cardiovascular:  Positive for leg swelling.  All other systems reviewed and are negative.      Objective:   Physical Exam Vitals reviewed.  HENT:     Head: Normocephalic.  Cardiovascular:     Rate and Rhythm: Normal rate.  Pulmonary:     Effort: Pulmonary effort is normal.  Musculoskeletal:     Right lower leg: Edema present.  Skin:    General: Skin is warm and dry.  Neurological:     Mental Status: He is alert and oriented to person, place, and time.  Psychiatric:        Mood and Affect: Mood normal.        Behavior: Behavior normal.        Thought Content: Thought content normal.        Judgment: Judgment normal.     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   Hyperlipidemia    Hypertension     Social History   Socioeconomic History   Marital status: Married    Spouse name: Not on file   Number of children: Not on file   Years of education: Not on file   Highest education level: Not on file  Occupational History   Not on file  Tobacco Use   Smoking status: Never   Smokeless tobacco: Never  Vaping Use   Vaping Use: Never used  Substance and Sexual Activity   Alcohol use: No   Drug use: No   Sexual activity: Yes    Birth control/protection: None  Other Topics Concern   Not on file  Social History Narrative   Not on file   Social  Determinants of Health   Financial Resource Strain: Not on file  Food Insecurity: No Food Insecurity (11/28/2022)   Hunger Vital Sign    Worried About Running Out of Food in the Last Year: Never true    Ran Out of Food in the Last Year: Never true  Transportation Needs: No Transportation Needs (11/28/2022)   PRAPARE - Hydrologist (Medical): No    Lack of Transportation (Non-Medical): No  Physical Activity: Not on file  Stress: Not on file  Social Connections: Not on file  Intimate Partner Violence: Not At Risk (11/28/2022)   Humiliation, Afraid, Rape, and Kick questionnaire    Fear of Current or Ex-Partner: No    Emotionally Abused: No    Physically Abused: No    Sexually Abused: No    Past Surgical History:  Procedure Laterality Date   BACK SURGERY     COLONOSCOPY WITH PROPOFOL N/A 11/05/2019   Procedure: COLONOSCOPY WITH PROPOFOL;  Surgeon: [name], MD;  Location: [facility] ENDOSCOPY;  Service: Gastroenterology;  Laterality: N/A;   IVC FILTER REMOVAL N/A 11/09/2022   Procedure: IVC FILTER REMOVAL;  Surgeon: Algernon Huxley, MD;  Location: [place] CV LAB;  Service: Cardiovascular;  Laterality: N/A;   PARTIAL COLECTOMY N/A 02/11/2020   Procedure: PARTIAL COLECTOMY -- sigmoid;  Surgeon: Olean Ree, MD;  Location: [facility] ORS;  Service: General;  Laterality: N/A;   PERIPHERAL VASCULAR THROMBECTOMY Right 10/07/2022   Procedure: PERIPHERAL VASCULAR THROMBECTOMY;  Surgeon: Algernon Huxley, MD;  Location: [place] [facility];  Service: Cardiovascular;  Laterality: Right;   PERIPHERAL VASCULAR THROMBECTOMY N/A 10/27/2022   Procedure: PERIPHERAL VASCULAR THROMBECTOMY;  Surgeon: Algernon Huxley, MD;  Location: [place] [facility];  Service: Cardiovascular;  Laterality: N/A;   PERIPHERAL VASCULAR THROMBECTOMY Right 11/07/2022   Procedure: PERIPHERAL VASCULAR THROMBECTOMY;  Surgeon: Algernon Huxley, MD;  Location: [place] [facility];  Service: Cardiovascular;  Laterality:  Right;   PERIPHERAL VASCULAR THROMBECTOMY N/A 11/09/2022   Procedure: PERIPHERAL VASCULAR THROMBECTOMY;  Surgeon: Algernon Huxley, MD;  Location: [place] [facility];  Service: Cardiovascular;  Laterality: N/A;  Inferior Vena Cava Thrombectomy   PERIPHERAL VASCULAR THROMBECTOMY Bilateral 11/28/2022   Procedure: PERIPHERAL VASCULAR THROMBECTOMY;  Surgeon: Algernon Huxley, MD;  Location: [place] [facility];  Service: Cardiovascular;  Laterality: Bilateral;   SPINE SURGERY     TAKE DOWN OF INTESTINAL FISTULA N/A 02/11/2020   Procedure: TAKE DOWN OF INTESTINAL FISTULA -- colovesical fistula;  Surgeon: Olean Ree, MD;  Location: [facility] ORS;  Service: General;  Laterality: N/A;   TRACHEAL SURGERY      Family History  Problem Relation Age of Onset   Prostate cancer Neg Hx    Bladder Cancer Neg Hx    Kidney cancer Neg Hx     Allergies  Allergen Reactions   Penicillins Swelling   Shrimp [Shellfish Allergy] Hives   Cephalosporins Hives and Itching   Cisatracurium Other (See Comments)    Other reaction(s): Unknown Other reaction(s): Unknown    Heparin Hives and Swelling    Patient's daughter reports patient had swelling/hives during hospital admission in [place]. They were on several medications at once so she is unsure whether it was VANCOMYCIN that caused this reaction or another concomitant medication.    Rocuronium Other (See Comments)    Other reaction(s): Unknown Other reaction(s): Unknown    Vancomycin Hives and Swelling    Patient's daughter reports patient had swelling/hives during hospital admission in [place]. They were on several medications at once so she is unsure whether it was VANCOMYCIN that caused this reaction or another concomitant medication.    Wound Dressings Rash    Tegaderm        Latest Ref Rng & Units 11/30/2022    5:53 AM 11/29/2022    5:27 AM 11/28/2022    7:27 PM  CBC  WBC 4.0 - 10.5 K/uL 4.4  6.0  3.7   Hemoglobin 13.0 - 17.0 g/dL 8.4  7.6  8.4   Hematocrit 39.0 -  52.0 % 27.3  25.4  28.4   Platelets 150 - 400 K/uL 235  234  233       CMP     Component Value Date/Time   NA 141 11/28/2022 1927   NA 143 10/14/2019 1424   K 3.9 11/28/2022 1927   CL 112 (H) 11/28/2022 1927   CO2 22 11/28/2022 1927   GLUCOSE 134 (H) 11/28/2022 1927   BUN 13 11/28/2022 1927   BUN 16 10/14/2019 1424   CREATININE 0.79 11/28/2022 1927   CALCIUM 8.5 (L) 11/28/2022 1927   PROT 6.2 (L) 11/28/2022 1927   PROT 6.8 10/14/2019 1424   ALBUMIN 3.5 11/28/2022 1927   ALBUMIN 4.3 10/14/2019 1424   AST 12 (L) 11/28/2022 1927   ALT 13 11/28/2022 1927   ALKPHOS 74 11/28/2022 1927   BILITOT 0.6 11/28/2022 1927   BILITOT 0.4 10/14/2019 1424   GFRNONAA >60 11/28/2022 1927   GFRAA >60 05/03/2020 0604     No results found.     Assessment & Plan:   1. 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  Discussed with the patient and family that this will be a permanent chronic occlusion and typically things such as bypass surgery are not beneficial as they typically cause more issues due to low flow from venous return.  Based on this there is no further intervention currently recommended.  Following this discussion the patient would like second opinion which is certainly reasonable.  With the patient's insistence that he has been taking his medication on the numerous recurrent episodes of his subsequent rethrombosis, there is concern that the patient may have an underlying clotting disorder.  There is some concern that if the clotting disorder is something such as antiphospholipid syndrome, even with his current Xarelto but that will not be enough to stop restenosis.  For this we will refer the patient to hematology for workup for possible clotting disorder. - Ambulatory referral to  Vascular Surgery -  Ambulatory referral to Hematology / Oncology  2. Primary hypertension Continue antihypertensive medications as already ordered, these medications have been reviewed and there are no changes at this time.  3. ILD (interstitial lung disease) ([place]) Prior to his pulmonary embolism the patient did have noted interstitial lung disease.  His continued shortness of breath I suspect there may be some chronic scarring ongoing.  We will send the patient to pulmonary for evaluation. - Ambulatory referral to Pulmonology  4. Lymphedema Recommend:  No surgery or intervention at this point in time.   The Patient is CEAP [id number].  The patient has been wearing compression for more than 12 weeks with no or little benefit.  The patient has been exercising daily for more than 12 weeks. The patient has been elevating and taking OTC pain medications for more than 12 weeks.  None of these have have eliminated the pain related to the lymphedema or the discomfort regarding excessive swelling and venous congestion.    I have reviewed my discussion with the patient regarding lymphedema and why it  causes symptoms.  Patient will continue wearing graduated compression on a daily basis. The patient should put the compression on first thing in the morning and removing them in the evening. The patient should not sleep in the compression.   In addition, behavioral modification throughout the day will be continued.  This will include frequent elevation (such as in a recliner), use of over the counter pain medications as needed and exercise such as walking.  The systemic causes for chronic edema such as liver, kidney and cardiac etiologies do not appear to have significant changed over the past year.    The patient has chronic , severe lymphedema with hyperpigmentation of the skin and has done MLD, skin care, medication, diet, exercise, elevation and compression for 4 weeks with no improvement,  I am recommending a lymphedema  pump.  The patient still has stage 3 lymphedema and therefore, I believe that a lymph pump is needed to improve the control of the patient's lymphedema and improve the quality of life.  Additionally, a lymph pump is warranted because it will reduce the risk of cellulitis and ulceration in the future.  Patient should follow-up in 3 months    Current Outpatient Medications on File Prior to Visit  Medication Sig Dispense Refill   Emollient ([place]) LOTN Apply 1 Application topically in the morning and at bedtime. 198 g 0   Iron, Ferrous Sulfate, 325 (65 Fe) MG TABS Take 1 tablet by mouth daily at 12 noon. Can take any over-the-counter form of iron supplement.     lidocaine (XYLOCAINE) 5 % ointment Apply 1 Application topically 3 (three) times daily as needed for mild pain.     rivaroxaban (XARELTO) 20 MG TABS tablet Take 1 tablet (20 mg total) by mouth daily with supper. 30 tablet 0   gabapentin (NEURONTIN) 300 MG capsule Take 300 mg by mouth 3 (three) times daily. (Patient not taking: Reported on 11/27/2022)     No current facility-administered medications on file prior to visit.    There are no Patient Instructions on file for this visit. No follow-ups on file.   [name], NP

## 2022-12-13 HISTORY — PX: MOHS SURGERY: SHX181

## 2022-12-19 ENCOUNTER — Inpatient Hospital Stay: Payer: 59 | Attending: Oncology | Admitting: Oncology

## 2022-12-19 ENCOUNTER — Encounter: Payer: Self-pay | Admitting: Oncology

## 2022-12-19 VITALS — BP 102/87 | HR 79 | Temp 98.0°F | Resp 16 | Ht 70.0 in | Wt 197.4 lb

## 2022-12-19 DIAGNOSIS — Z79899 Other long term (current) drug therapy: Secondary | ICD-10-CM | POA: Diagnosis not present

## 2022-12-19 DIAGNOSIS — I10 Essential (primary) hypertension: Secondary | ICD-10-CM | POA: Diagnosis not present

## 2022-12-19 DIAGNOSIS — D6852 Prothrombin gene mutation: Secondary | ICD-10-CM | POA: Diagnosis present

## 2022-12-19 DIAGNOSIS — I82412 Acute embolism and thrombosis of left femoral vein: Secondary | ICD-10-CM | POA: Diagnosis present

## 2022-12-19 DIAGNOSIS — D649 Anemia, unspecified: Secondary | ICD-10-CM | POA: Diagnosis not present

## 2022-12-19 DIAGNOSIS — Z7901 Long term (current) use of anticoagulants: Secondary | ICD-10-CM | POA: Diagnosis not present

## 2022-12-19 DIAGNOSIS — I82401 Acute embolism and thrombosis of unspecified deep veins of right lower extremity: Secondary | ICD-10-CM | POA: Diagnosis not present

## 2022-12-19 MED ORDER — RIVAROXABAN 20 MG PO TABS: 20.0000 mg | ORAL_TABLET | Freq: Every day | ORAL | 3 refills | Status: DC

## 2022-12-25 NOTE — Progress Notes (Signed)
Hematology/Oncology Consult note [facility]  [phone] [phone]  Patient Care Team: Norm Parcel, MD as PCP - General (Family Medicine)   Name of the patient: Russell [name]  [id number]  [birth year]   Date of visit: 12/25/22  Diagnosis- recurrent b/L LE DVT  Chief complaint/ Reason for visit-post hospital discharge follow-up  Heme/Onc history:  patient is a 72 year old Hispanic male who had left lower extremity DVT in May 2023. Ultrasound showed acute nonocclusive DVT extending from the left femoral vein into the left popliteal vein.  No evidence of DVT on the right side.  At that time patient was started on Eliquis.  He only took the starter pack but never continued anticoagulation following that.    2.  He then presented with worsening lower extremity swelling and November 2023 at that time he was found to have extensive acute DVT throughout his right lower extremity from the common femoral vein into the calf CT angio chest did not reveal any evidence of PE.  He underwent catheter directed thrombolysis to the right iliac and common femoral vein and mechanical thrombectomy on 10/27/2022.  He was started on Eliquis at that time.     3. He was subsequently followed by vascular surgery but then again presented to the ER on 11/05/2022 with worsening right lower extremity swelling at that time he was noted to have an occlusive DVT in the right common femoral vein and nonocclusive thrombus in the profundofemoral vein and posterior tibial vein which was not noted on prior ultrasound on 10/25/2022.  He had a CT venogram on 11/05/2022 which showed IVC filter in place with a DVT extending from the IVC filter inferiorly through the lower IVC to the confluence of the iliac veins.  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   Vascular surgery retrieved the IVC filter and patient was discharged on Eliquis in December 2023.  4.Patient was readmitted to the hospital in January 2024 and was found to have bilateral lower extremity DVT despite being on Eliquis.  He underwent recannulization with thrombectomy and angioplasty of the left common iliac vein.  He was subsequently discharged on Xarelto.  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  Testing for antiphospholipid antibody syndrome was negative.  X-ray study was negative anticardiolipin antibody and beta-2 glycoprotein titers were less than 40.  5.  Patient has a supposedly allergy to [place]   Around the same time patient also had vancomycin and therefore patient developed some rash and hives.  It is unclear if the allergy was towards vancomycin or heparin.   Interval history-Patient is currently on Xarelto but does report generalized itching mainly at night.  History obtained with the help of Spanish interpreter  ECOG PS- 1 Pain scale- 0 Opioid associated constipation- no  Review of systems- Review of Systems  Constitutional:  Negative for chills, fever, malaise/fatigue and weight loss.  HENT:  Negative for congestion, ear discharge and nosebleeds.   Eyes:  Negative for blurred vision.  Respiratory:  Negative for cough, hemoptysis, sputum production, shortness of breath and wheezing.   Cardiovascular:  Negative for chest pain, palpitations, orthopnea and claudication.  Gastrointestinal:  Negative for abdominal pain, blood in stool, constipation, diarrhea, heartburn, melena, nausea and vomiting.  Genitourinary:  Negative for dysuria, flank pain, frequency, hematuria and urgency.  Musculoskeletal:  Negative for back pain, joint pain and myalgias.  Skin:  Positive for itching. Negative for rash.  Neurological:  Negative for dizziness, tingling, focal weakness,  seizures, weakness and headaches.  Endo/Heme/Allergies:  Does not bruise/bleed easily.  Psychiatric/Behavioral:  Negative for depression and suicidal ideas. The patient does not have insomnia.       Allergies  Allergen Reactions   Penicillins Swelling   Shrimp [Shellfish Allergy] Hives   Cephalosporins Hives and Itching   Cisatracurium Other (See Comments)    Other reaction(s): Unknown Other reaction(s): Unknown    Heparin Hives and Swelling    Patient's daughter reports patient had swelling/hives during hospital admission in [place]. They were on several medications at once so she is unsure whether it was VANCOMYCIN that caused this reaction or another concomitant medication.    Rocuronium Other (See Comments)    Other reaction(s): Unknown Other reaction(s): Unknown    Vancomycin Hives and Swelling    Patient's daughter reports patient had swelling/hives during hospital admission in [place]. They were on several medications at once so she is unsure whether it was VANCOMYCIN that caused this reaction or another concomitant medication.    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   DVT (deep venous thrombosis) (HCC)    pt had surgery to remove clot on right leg   Hyperlipidemia    Hypertension    Skin cancer of scalp    surgery 12/13/2022     Past Surgical History:  Procedure Laterality Date   BACK SURGERY     COLONOSCOPY WITH PROPOFOL N/A 11/05/2019   Procedure: COLONOSCOPY WITH PROPOFOL;  Surgeon: [name], MD;  Location: [facility] ENDOSCOPY;  Service: Gastroenterology;  Laterality: N/A;   IVC FILTER REMOVAL N/A 11/09/2022   Procedure: IVC FILTER REMOVAL;  Surgeon: Algernon Huxley, MD;  Location: [place] CV LAB;  Service: Cardiovascular;  Laterality: N/A;   MOHS SURGERY  12/13/2022   scalp of head   PARTIAL COLECTOMY N/A 02/11/2020   Procedure: PARTIAL COLECTOMY -- sigmoid;   Surgeon: Olean Ree, MD;  Location: [facility] ORS;  Service: General;  Laterality: N/A;   PERIPHERAL VASCULAR THROMBECTOMY Right 10/07/2022   Procedure: PERIPHERAL VASCULAR THROMBECTOMY;  Surgeon: Algernon Huxley, MD;  Location: [place] [facility] LAB;  Service: Cardiovascular;  Laterality: Right;   PERIPHERAL VASCULAR THROMBECTOMY N/A 10/27/2022   Procedure: PERIPHERAL VASCULAR THROMBECTOMY;  Surgeon: Algernon Huxley, MD;  Location: [place] [facility] LAB;  Service: Cardiovascular;  Laterality: N/A;   PERIPHERAL VASCULAR THROMBECTOMY Right 11/07/2022   Procedure: PERIPHERAL VASCULAR THROMBECTOMY;  Surgeon: Algernon Huxley, MD;  Location: [place] [facility] LAB;  Service: Cardiovascular;  Laterality: Right;   PERIPHERAL VASCULAR THROMBECTOMY N/A 11/09/2022   Procedure: PERIPHERAL VASCULAR THROMBECTOMY;  Surgeon: Algernon Huxley, MD;  Location: [place] [facility];  Service: Cardiovascular;  Laterality: N/A;  Inferior Vena Cava Thrombectomy   PERIPHERAL VASCULAR THROMBECTOMY Bilateral 11/28/2022   Procedure: PERIPHERAL VASCULAR THROMBECTOMY;  Surgeon: Algernon Huxley, MD;  Location: [place] CV LAB;  Service: Cardiovascular;  Laterality: Bilateral;   SPINE SURGERY     TAKE DOWN OF INTESTINAL FISTULA N/A 02/11/2020   Procedure: TAKE DOWN OF INTESTINAL FISTULA -- colovesical fistula;  Surgeon: Olean Ree, MD;  Location: [facility] ORS;  Service: General;  Laterality: N/A;  TRACHEAL SURGERY      Social History   Socioeconomic History   Marital status: Married    Spouse name: Not on file   Number of children: Not on file   Years of education: Not on file   Highest education level: Not on file  Occupational History   Not on file  Tobacco Use   Smoking status: Never   Smokeless tobacco: Never  Vaping Use   Vaping Use: Never used  Substance and Sexual Activity   Alcohol use: No   Drug use: No   Sexual activity: Yes    Birth control/protection: None  Other Topics Concern   Not on file  Social History  Narrative   Not on file   Social Determinants of Health   Financial Resource Strain: Not on file  Food Insecurity: No Food Insecurity (11/28/2022)   Hunger Vital Sign    Worried About Running Out of Food in the Last Year: Never true    Ran Out of Food in the Last Year: Never true  Transportation Needs: No Transportation Needs (11/28/2022)   PRAPARE - Hydrologist (Medical): No    Lack of Transportation (Non-Medical): No  Physical Activity: Not on file  Stress: Not on file  Social Connections: Not on file  Intimate Partner Violence: Not At Risk (11/28/2022)   Humiliation, Afraid, Rape, and Kick questionnaire    Fear of Current or Ex-Partner: No    Emotionally Abused: No    Physically Abused: No    Sexually Abused: No    Family History  Problem Relation Age of Onset   Prostate cancer Neg Hx    Bladder Cancer Neg Hx    Kidney cancer Neg Hx      Current Outpatient Medications:    Emollient (AVEENO [employer]IVE [employer]UR[employer] DAILY) LOTN, Apply 1 Application topically in the morning and at bedtime., Disp: 198 g, Rfl: 0   gabapentin (NEURONTIN) 300 MG capsule, Take 300 mg by mouth 3 (three) times daily., Disp: , Rfl:    lidocaine (XYLOCAINE) 5 % ointment, Apply 1 Application topically 3 (three) times daily as needed for mild pain., Disp: , Rfl:    rivaroxaban (XARELTO) 20 MG TABS tablet, Take 1 tablet (20 mg total) by mouth daily with supper., Disp: 30 tablet, Rfl: 3  Physical exam:  Vitals:   12/19/22 0930  BP: 102/87  Pulse: 79  Resp: 16  Temp: 98 F (36.7 C)  TempSrc: Oral  Weight: 197 lb 6.4 oz (89.5 kg)  Height: '5\' 10"'$  (1.778 m)   Physical Exam Cardiovascular:     Rate and Rhythm: Normal rate and regular rhythm.     Heart sounds: Normal heart sounds.  Pulmonary:     Effort: Pulmonary effort is normal.     Breath sounds: Normal breath sounds.  Abdominal:     General: Bowel sounds are normal.     Palpations: Abdomen is soft.  Skin:    General: Skin  is warm and dry.  Neurological:     Mental Status: He is alert and oriented to person, place, and time.         Latest Ref Rng & Units 11/28/2022    7:27 PM  CMP  Glucose 70 - 99 mg/dL 134   BUN 8 - 23 mg/dL 13   Creatinine 0.61 - 1.24 mg/dL 0.79   Sodium 135 - 145 mmol/L 141   Potassium 3.5 - 5.1 mmol/L 3.9   Chloride 98 - 111  mmol/L 112   CO2 22 - 32 mmol/L 22   Calcium 8.9 - 10.3 mg/dL 8.5   Total Protein 6.5 - 8.1 g/dL 6.2   Total Bilirubin 0.3 - 1.2 mg/dL 0.6   Alkaline Phos 38 - 126 U/L 74   AST 15 - 41 U/L 12   ALT 0 - 44 U/L 13       Latest Ref Rng & Units 11/30/2022    5:53 AM  CBC  WBC 4.0 - 10.5 K/uL 4.4   Hemoglobin 13.0 - 17.0 g/dL 8.4   Hematocrit 39.0 - 52.0 % 27.3   Platelets 150 - 400 K/uL 235     No images are attached to the encounter.  PERIPHERAL VASCULAR CATHETERIZATION  Result Date: 11/28/2022 See surgical note for result.  US Venous Img Lower Bilateral  Result Date: 11/27/2022 CLINICAL DATA:  History of DVT. Complains of bilateral lower extremity swelling. EXAM: BILATERAL LOWER EXTREMITY VENOUS DOPPLER ULTRASOUND TECHNIQUE: Gray-scale sonography with graded compression, as well as color Doppler and duplex ultrasound were performed to evaluate the lower extremity deep venous systems from the level of the common femoral vein and including the common femoral, femoral, profunda femoral, popliteal and calf veins including the posterior tibial, peroneal and gastrocnemius veins when visible. The superficial great saphenous vein was also interrogated. Spectral Doppler was utilized to evaluate flow at rest and with distal augmentation maneuvers in the common femoral, femoral and popliteal veins. COMPARISON:  11/05/2022 FINDINGS: RIGHT LOWER EXTREMITY Common Femoral Vein: Occlusive thrombus identified Saphenofemoral Junction: No evidence of thrombus. Normal compressibility and flow on color Doppler imaging. Profunda Femoral Vein: Occlusive thrombus identified.  Femoral Vein: Occlusive thrombus identified. Popliteal Vein: No evidence of thrombus. Normal compressibility, respiratory phasicity and response to augmentation. Calf Veins: No evidence of thrombus. Normal compressibility and flow on color Doppler imaging. Superficial Great Saphenous Vein: No evidence of thrombus. Normal compressibility. Venous Reflux:  None. Other Findings: Subcutaneous edema identified at the level of the calf veins. LEFT LOWER EXTREMITY Common Femoral Vein: Occlusive thrombus identified. Saphenofemoral Junction: Occlusive thrombus identified. Profunda Femoral Vein: Occlusive thrombus identified. Femoral Vein: Occlusive thrombus identified. Popliteal Vein: Occlusive thrombus identified. Calf Veins: No evidence of thrombus. Normal compressibility and flow on color Doppler imaging. Superficial Great Saphenous Vein: No evidence of thrombus. Normal compressibility. Venous Reflux:  None. Other Findings:  None. IMPRESSION: Exam is positive for bilateral lower extremity deep vein thromboses Critical Value/emergent results were called by telephone at the time of interpretation on 11/27/2022 at 4:27 pm to provider Dr. Joni Fears, who verbally acknowledged these results. Electronically Signed   By: Kerby Moors M.D.   On: 11/27/2022 16:27     Assessment and plan- Patient is a 73 y.o. male with recurrent bilateral lower extremity DVT here for posthospital discharge follow-up  Bilateral lower extremity DVT: Hypercoagulable workup reveals heterozygosity for prothrombin gene mutation which increases the risk of falls DVT but the role of the gene mutation and recurrent DVT is unclear.  Given the fact that patient has had recurrent episodes of DVT over the last 1 year he will need to stay on indefinite anticoagulation.  Most recently patient developed bilateral lower extremity DVT while he was on Eliquis and therefore shifted to Xarelto.  I would like him to stay on Xarelto at this time.  Patient is concerned  about his symptoms of pruritus especially at night and have asked him to take as needed Benadryl.  If the itching becomes unbearable we will consider switching him from  Xarelto to another anticoagulant but it will have to be something like Coumadin which will require frequent INR checks or subcutaneous injections like Arixtra.  Patient is agreeable to staying on Xarelto at this time.  I am renewing his Xarelto prescription.  Normocytic anemia: Likely secondary to recent hospitalizations.  I will repeat CBC ferritin and iron studies B12 folate and BMP in 4 months   Visit Diagnosis 1. Symptomatic anemia   2. 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 Randa Evens, MD, MPH [facility] [phone] 12/25/2022 8:57 PM

## 2022-12-27 ENCOUNTER — Other Ambulatory Visit (INDEPENDENT_AMBULATORY_CARE_PROVIDER_SITE_OTHER): Payer: Self-pay | Admitting: Nurse Practitioner

## 2022-12-27 DIAGNOSIS — I82413 Acute embolism and thrombosis of femoral vein, bilateral: Secondary | ICD-10-CM

## 2022-12-30 ENCOUNTER — Ambulatory Visit (INDEPENDENT_AMBULATORY_CARE_PROVIDER_SITE_OTHER): Payer: Medicare Other | Admitting: Nurse Practitioner

## 2022-12-30 ENCOUNTER — Encounter (INDEPENDENT_AMBULATORY_CARE_PROVIDER_SITE_OTHER): Payer: Medicare Other

## 2023-01-13 ENCOUNTER — Institutional Professional Consult (permissible substitution): Payer: 59 | Admitting: Internal Medicine

## 2023-01-19 ENCOUNTER — Ambulatory Visit (INDEPENDENT_AMBULATORY_CARE_PROVIDER_SITE_OTHER): Payer: 59 | Admitting: Student in an Organized Health Care Education/Training Program

## 2023-01-19 ENCOUNTER — Encounter: Payer: Self-pay | Admitting: Student in an Organized Health Care Education/Training Program

## 2023-01-19 VITALS — BP 124/70 | HR 81 | Temp 98.0°F | Ht 70.0 in | Wt 194.6 lb

## 2023-01-19 DIAGNOSIS — J849 Interstitial pulmonary disease, unspecified: Secondary | ICD-10-CM

## 2023-01-19 NOTE — Progress Notes (Signed)
Synopsis: Referred in for ILD by [name], NP  Assessment & Plan:   1. ILD (interstitial lung disease) ([place])  He is presenting for the evaluation of findings of ILD on chest CT (10/2022). I have reviewed his imaging with multiple chest CT's notable for ground glass opacities as well as subpleural reticulation. There appears to be a gradient of increased reticulation in a cranio-caudal direction. No honeycombs noted on my review. A CT of the abdomen and plevis from 2020 has some lung cuts with normal appearing lung tissue.  There clearly has been notable progression in the images that makes these findings unlikely to be due to previous ARDS. 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 My differentials include connective tissue associated ILD, hypersensitivity pneumonitis, IPF, and NSIP.  I will obtain a high resolution chest CT with supine, prone, and expiratory films for better visualization. Furthermore, I will obtain an auto-immune workup, hypersensitivity profile, and a myositis panel. Patient will also require pulmonary function testing as well. Finally, I have given him our ILD questionnaire and he assured me that he has someone to help him fill it as it is in [place]. He clearly has exposures in a line of work associated with pneumoconiosis, but it's unlikely that this progresses after many years of lack of exposure.  - CT CHEST HIGH RESOLUTION; Future - Aldolase - ANA 12 Plus Profile (RDL) - ANCA Profile - Anti-CCP Ab, IgG + IgA (RDL) - CK - Hypersensitivity Pneumonitis - MyoMarker 3 Plus Profile (RDL) - Rheumatoid factor - Pulmonary Function Test [facility] Only; Future - Comprehensive metabolic panel - CBC with Differential/Platelet   Return in about 4 weeks (around 02/16/2023).  I spent 60 minutes caring for this patient today, including preparing to see the patient, obtaining a medical history , reviewing a separately  obtained history, performing a medically appropriate examination and/or evaluation, counseling and educating the patient/family/caregiver, ordering medications, tests, or procedures, documenting clinical information in the electronic health record, and independently interpreting results (not separately reported/billed) and communicating results to the patient/family/caregiver  [name], MD [facility] 01/19/2023 4:36 PM    End of visit medications:  No orders of the defined types were placed in this encounter.    Current Outpatient Medications:    Emollient (AVEENO ACTIVE NATURALS DAILY) LOTN, Apply 1 Application topically in the morning and at bedtime., Disp: 198 g, Rfl: 0   ferrous sulfate 325 (65 FE) MG tablet, Take by mouth., Disp: , Rfl:    gabapentin (NEURONTIN) 300 MG capsule, Take 300 mg by mouth 3 (three) times daily., Disp: , Rfl:    lidocaine (XYLOCAINE) 5 % ointment, Apply 1 Application topically 3 (three) times daily as needed for mild pain., Disp: , Rfl:    rivaroxaban (XARELTO) 20 MG TABS tablet, Take 1 tablet (20 mg total) by mouth daily with supper., Disp: 30 tablet, Rfl: 3   Subjective:   PATIENT ID: Russell [name] GENDER: male DOB: 11/01/1951, [name]: [id number]  Chief Complaint  Patient presents with   pulmonary consult    No current sx.     HPI  Russell [name] is a pleasant 72 year old male with a past medical history of ILD who presents to clinic for an evaluation of ILD. Patient is not aware that he has said diagnosis and is not sure whey he is here. Patient was interviewed in the presence of a live spanish interpretor.  He  is asymptomatic and denies any respiratory symptoms. Specifically, he denies shortness of breath, denies cough, denies chest pain, and denies chest tightness. He has no limitations to his activity, and is able to do all the things he wants to do in his life. He feels that he is overall healthy. He does not have any joint  pains or swelling, and denies any rashes. 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  Review of the medical record, however, is notable for multiple visits to pulmonology. The patient was previously seen by [facility] Pulmonology with Dr. Lanney Gins. He was last seen there in July of 2022. The patient was seen by pulmonologists at [facility] dating back to 2017. Review of the record notes a diagnosis of pulmonary fibrosis, and it was attributed to a previous bout with ARDS secondary to influenza.  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 He moved to [place] in his 20's to play football, before immigrating to the [place]. He first lived in [place], where he built a career in Mining engineer, fabrication, Games developer. 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 He is a lifelong non-smoker and enjoys Salsa dancing.  Ancillary information including prior medications, full medical/surgical/family/social histories, and PFTs (when available) are listed below and have been reviewed.   Review of Systems  Constitutional:  Negative for chills, fever, malaise/fatigue and weight loss.  Respiratory:  Negative for cough, hemoptysis, sputum production, shortness of breath and wheezing.   Cardiovascular:  Negative for chest pain.     Objective:   Vitals:   01/19/23 1411  BP: 124/70  Pulse: 81  Temp: 98 F (36.7 C)  TempSrc: Temporal  SpO2: 96%  Weight: 194 lb 9.6 oz (88.3 kg)  Height: '5\' 10"'$  (1.778 m)   96% on RA  BMI Readings from Last 3 Encounters:  01/19/23 27.92 kg/m  12/19/22 28.32 kg/m  12/01/22 29.04 kg/m   Wt Readings from Last 3 Encounters:  01/19/23 194 lb 9.6 oz (88.3 kg)  12/19/22 197 lb 6.4 oz (89.5 kg)  12/01/22 202 lb 6.4 oz (91.8 kg)    Physical Exam Constitutional:       Appearance: Normal appearance. He is not ill-appearing.  HENT:     Head: Normocephalic.     Nose: Nose normal.     Mouth/Throat:     Mouth: Mucous membranes are moist.  Cardiovascular:     Rate and Rhythm: Normal rate and regular rhythm.     Pulses: Normal pulses.     Heart sounds: Normal heart sounds.  Pulmonary:     Effort: Pulmonary effort is normal.     Breath sounds: Normal breath sounds.  Abdominal:     Palpations: Abdomen is soft.  Musculoskeletal:     Right lower leg: No edema.     Left lower leg: No edema.  Neurological:     General: No focal deficit present.     Mental Status: He is alert and oriented to person, place, and time. Mental status is at baseline.     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   DVT (deep venous thrombosis) (HCC)    pt had surgery to remove clot on right leg   Hyperlipidemia  Hypertension    Skin cancer of scalp    surgery 12/13/2022     Family History  Problem Relation Age of Onset   Prostate cancer Neg Hx    Bladder Cancer Neg Hx    Kidney cancer Neg Hx      Past Surgical History:  Procedure Laterality Date   BACK SURGERY     COLONOSCOPY WITH PROPOFOL N/A 11/05/2019   Procedure: COLONOSCOPY WITH PROPOFOL;  Surgeon: [name], MD;  Location: [facility] ENDOSCOPY;  Service: Gastroenterology;  Laterality: N/A;   IVC FILTER REMOVAL N/A 11/09/2022   Procedure: IVC FILTER REMOVAL;  Surgeon: Algernon Huxley, MD;  Location: [place] CV LAB;  Service: Cardiovascular;  Laterality: N/A;   MOHS SURGERY  12/13/2022   scalp of head   PARTIAL COLECTOMY N/A 02/11/2020   Procedure: PARTIAL COLECTOMY -- sigmoid;  Surgeon: Olean Ree, MD;  Location: [facility] ORS;  Service: General;  Laterality: N/A;   PERIPHERAL VASCULAR THROMBECTOMY Right 10/07/2022   Procedure: PERIPHERAL VASCULAR THROMBECTOMY;  Surgeon: Algernon Huxley, MD;  Location: [place] [facility];   Service: Cardiovascular;  Laterality: Right;   PERIPHERAL VASCULAR THROMBECTOMY N/A 10/27/2022   Procedure: PERIPHERAL VASCULAR THROMBECTOMY;  Surgeon: Algernon Huxley, MD;  Location: [place] [facility];  Service: Cardiovascular;  Laterality: N/A;   PERIPHERAL VASCULAR THROMBECTOMY Right 11/07/2022   Procedure: PERIPHERAL VASCULAR THROMBECTOMY;  Surgeon: Algernon Huxley, MD;  Location: [place] [facility];  Service: Cardiovascular;  Laterality: Right;   PERIPHERAL VASCULAR THROMBECTOMY N/A 11/09/2022   Procedure: PERIPHERAL VASCULAR THROMBECTOMY;  Surgeon: Algernon Huxley, MD;  Location: [place] [facility];  Service: Cardiovascular;  Laterality: N/A;  Inferior Vena Cava Thrombectomy   PERIPHERAL VASCULAR THROMBECTOMY Bilateral 11/28/2022   Procedure: PERIPHERAL VASCULAR THROMBECTOMY;  Surgeon: Algernon Huxley, MD;  Location: [place] [facility];  Service: Cardiovascular;  Laterality: Bilateral;   SPINE SURGERY     TAKE DOWN OF INTESTINAL FISTULA N/A 02/11/2020   Procedure: TAKE DOWN OF INTESTINAL FISTULA -- colovesical fistula;  Surgeon: Olean Ree, MD;  Location: [facility] ORS;  Service: General;  Laterality: N/A;   TRACHEAL SURGERY      Social History   Socioeconomic History   Marital status: Married    Spouse name: Not on file   Number of children: Not on file   Years of education: Not on file   Highest education level: Not on file  Occupational History   Not on file  Tobacco Use   Smoking status: Never   Smokeless tobacco: Never  Vaping Use   Vaping Use: Never used  Substance and Sexual Activity   Alcohol use: No   Drug use: No   Sexual activity: Yes    Birth control/protection: None  Other Topics Concern   Not on file  Social History Narrative   Not on file   Social Determinants of Health   Financial Resource Strain: Not on file  Food Insecurity: No Food Insecurity (11/28/2022)   Hunger Vital Sign    Worried About Running Out of Food in the Last Year: Never true    Ran Out of  Food in the Last Year: Never true  Transportation Needs: No Transportation Needs (11/28/2022)   PRAPARE - Hydrologist (Medical): No    Lack of Transportation (Non-Medical): No  Physical Activity: Not on file  Stress: Not on file  Social Connections: Not on file  Intimate Partner Violence: Not At Risk (11/28/2022)   Humiliation,  Afraid, Rape, and Kick questionnaire    Fear of Current or Ex-Partner: No    Emotionally Abused: No    Physically Abused: No    Sexually Abused: No     Allergies  Allergen Reactions   Penicillins Swelling   Shrimp [Shellfish Allergy] Hives   Cephalosporins Hives and Itching   Cisatracurium Other (See Comments)    Other reaction(s): Unknown Other reaction(s): Unknown    Heparin Hives and Swelling    Patient's daughter reports patient had swelling/hives during hospital admission in [place]. They were on several medications at once so she is unsure whether it was VANCOMYCIN that caused this reaction or another concomitant medication.    Rocuronium Other (See Comments)    Other reaction(s): Unknown Other reaction(s): Unknown    Vancomycin Hives and Swelling    Patient's daughter reports patient had swelling/hives during hospital admission in [place]. They were on several medications at once so she is unsure whether it was VANCOMYCIN that caused this reaction or another concomitant medication.    Wound Dressings Rash    Tegaderm      CBC    Component Value Date/Time   WBC 4.4 11/30/2022 0553   RBC 3.11 (L) 11/30/2022 0553   HGB 8.4 (L) 11/30/2022 0553   HGB 14.9 10/14/2019 1424   HCT 27.3 (L) 11/30/2022 0553   HCT 45.9 10/14/2019 1424   PLT 235 11/30/2022 0553   PLT 208 10/14/2019 1424   MCV 87.8 11/30/2022 0553   MCV 91 10/14/2019 1424   MCH 27.0 11/30/2022 0553   MCHC 30.8 11/30/2022 0553   RDW 15.8 (H) 11/30/2022 0553   RDW 13.9 10/14/2019 1424   LYMPHSABS 1.4 11/27/2022 1442   LYMPHSABS 2.0 10/14/2019 1424   MONOABS  0.5 11/27/2022 1442   EOSABS 0.3 11/27/2022 1442   EOSABS 0.2 10/14/2019 1424   BASOSABS 0.1 11/27/2022 1442   BASOSABS 0.0 10/14/2019 1424    Pulmonary Functions Testing Results:     No data to display          Outpatient Medications Prior to Visit  Medication Sig Dispense Refill   Emollient (AVEENO ACTIVE NATURALS DAILY) LOTN Apply 1 Application topically in the morning and at bedtime. 198 g 0   ferrous sulfate 325 (65 FE) MG tablet Take by mouth.     gabapentin (NEURONTIN) 300 MG capsule Take 300 mg by mouth 3 (three) times daily.     lidocaine (XYLOCAINE) 5 % ointment Apply 1 Application topically 3 (three) times daily as needed for mild pain.     rivaroxaban (XARELTO) 20 MG TABS tablet Take 1 tablet (20 mg total) by mouth daily with supper. 30 tablet 3   No facility-administered medications prior to visit.

## 2023-01-19 NOTE — Patient Instructions (Signed)
Today, I ordered blood work. You can get them draw at your preferred [facility] draw station. The nearest one to [facility] is at nearby [place] ([address], [place], [place] [address]).

## 2023-01-24 LAB — COMPREHENSIVE METABOLIC PANEL
ALT: 12 IU/L (ref 0–44)
AST: 11 IU/L (ref 0–40)
Albumin/Globulin Ratio: 2.2 (ref 1.2–2.2)
Albumin: 4.7 g/dL (ref 3.8–4.8)
Alkaline Phosphatase: 84 IU/L (ref 44–121)
BUN/Creatinine Ratio: 16 (ref 10–24)
BUN: 15 mg/dL (ref 8–27)
Bilirubin Total: 0.4 mg/dL (ref 0.0–1.2)
CO2: 18 mmol/L — ABNORMAL LOW (ref 20–29)
Calcium: 9.3 mg/dL (ref 8.6–10.2)
Chloride: 109 mmol/L — ABNORMAL HIGH (ref 96–106)
Creatinine, Ser: 0.96 mg/dL (ref 0.76–1.27)
Globulin, Total: 2.1 g/dL (ref 1.5–4.5)
Glucose: 94 mg/dL (ref 70–99)
Potassium: 4.3 mmol/L (ref 3.5–5.2)
Sodium: 143 mmol/L (ref 134–144)
Total Protein: 6.8 g/dL (ref 6.0–8.5)
eGFR: 83 mL/min/{1.73_m2} (ref >59)

## 2023-01-24 LAB — CBC WITH DIFFERENTIAL/PLATELET
Basophils Absolute: 0.1 10*3/uL (ref 0.0–0.2)
Basos: 1 %
EOS (ABSOLUTE): 0.4 10*3/uL (ref 0.0–0.4)
Eos: 10 %
Hematocrit: 41.1 % (ref 37.5–51.0)
Hemoglobin: 12.6 g/dL — ABNORMAL LOW (ref 13.0–17.7)
Immature Grans (Abs): 0 10*3/uL (ref 0.0–0.1)
Immature Granulocytes: 0 %
Lymphocytes Absolute: 1.5 10*3/uL (ref 0.7–3.1)
Lymphs: 36 %
MCH: 25.6 pg — ABNORMAL LOW (ref 26.6–33.0)
MCHC: 30.7 g/dL — ABNORMAL LOW (ref 31.5–35.7)
MCV: 83 fL (ref 79–97)
Monocytes Absolute: 0.5 10*3/uL (ref 0.1–0.9)
Monocytes: 11 %
Neutrophils Absolute: 1.8 10*3/uL (ref 1.4–7.0)
Neutrophils: 42 %
Platelets: 171 10*3/uL (ref 150–450)
RBC: 4.93 x10E6/uL (ref 4.14–5.80)
RDW: 20.2 % — ABNORMAL HIGH (ref 11.6–15.4)
WBC: 4.2 10*3/uL (ref 3.4–10.8)

## 2023-02-02 ENCOUNTER — Ambulatory Visit: Payer: Medicare Other

## 2023-02-09 ENCOUNTER — Ambulatory Visit: Payer: Medicare Other

## 2023-02-09 ENCOUNTER — Ambulatory Visit: Payer: Medicare Other | Attending: Student in an Organized Health Care Education/Training Program

## 2023-02-09 LAB — ANCA PROFILE
Anti-MPO Antibodies: <0.2 units (ref 0.0–0.9)
Anti-PR3 Antibodies: <0.2 units (ref 0.0–0.9)
Atypical pANCA: <1:20 {titer}
C-ANCA: <1:20 {titer}
P-ANCA: <1:20 {titer}

## 2023-02-09 LAB — ALDOLASE: Aldolase: 4.1 U/L (ref 3.3–10.3)

## 2023-02-09 LAB — HYPERSENSITIVITY PNEUMONITIS
A. Pullulans Abs: NEGATIVE
A.Fumigatus #1 Abs: NEGATIVE
Micropolyspora faeni, IgG: NEGATIVE
Pigeon Serum Abs: NEGATIVE
Thermoact. Saccharii: NEGATIVE
Thermoactinomyces vulgaris, IgG: NEGATIVE

## 2023-02-09 LAB — ANA 12 PLUS PROFILE, POSITIVE
Anti-Cardiolipin Ab, IgA (RDL): <12 APL U/mL (ref <12)
Anti-Cardiolipin Ab, IgG (RDL): <15 GPL U/mL (ref <15)
Anti-Cardiolipin Ab, IgM (RDL): 19 MPL U/mL (ref <13)
Anti-Centromere Ab (RDL): <1:40 {titer}
Anti-Chromatin Ab, IgG (RDL): <20 Units (ref <20)
Anti-La (SS-B) Ab (RDL): <20 Units (ref <20)
Anti-Ro (SS-A) Ab (RDL): <20 Units (ref <20)
Anti-Scl-70 Ab (RDL): <20 Units (ref <20)
Anti-Sm Ab (RDL): <20 Units (ref <20)
Anti-TPO Ab (RDL): <9 IU/mL (ref <9.0)
Anti-dsDNA Ab by Farr(RDL): <8 IU/mL (ref <8.0)
C3 Complement (RDL): 114 mg/dL (ref 82–167)
C4 Complement (RDL): 20 mg/dL (ref 14–44)
Midbody Pattern: 1:320 {titer} — ABNORMAL HIGH
Speckled Pattern: 1:320 {titer} — ABNORMAL HIGH

## 2023-02-09 LAB — RHEUMATOID FACTOR: Rheumatoid fact SerPl-aCnc: <10 IU/mL (ref <14.0)

## 2023-02-09 LAB — MYOMARKER 3 PLUS PROFILE (RDL)

## 2023-02-09 LAB — CK: Total CK: 66 U/L (ref 41–331)

## 2023-02-09 LAB — ANTI-CCP AB, IGG + IGA (RDL): Anti-CCP Ab, IgG + IgA (RDL): <20 Units (ref <20)

## 2023-02-09 LAB — ANA 12 PLUS PROFILE (RDL): Anti-Nuclear Ab by IFA (RDL): POSITIVE — AB

## 2023-02-17 ENCOUNTER — Ambulatory Visit
Admission: RE | Admit: 2023-02-17 | Discharge: 2023-02-17 | Disposition: A | Discharge status: Home / Self Care | Payer: Medicare Other | Source: Ambulatory Visit | Attending: Family Medicine | Admitting: Family Medicine

## 2023-02-17 DIAGNOSIS — J849 Interstitial pulmonary disease, unspecified: Secondary | ICD-10-CM | POA: Insufficient documentation

## 2023-02-27 ENCOUNTER — Ambulatory Visit (INDEPENDENT_AMBULATORY_CARE_PROVIDER_SITE_OTHER): Payer: Medicare Other | Admitting: Student in an Organized Health Care Education/Training Program

## 2023-02-27 ENCOUNTER — Encounter: Payer: Self-pay | Admitting: Student in an Organized Health Care Education/Training Program

## 2023-02-27 VITALS — BP 118/70 | HR 100 | Temp 98.0°F | Ht 70.0 in | Wt 197.6 lb

## 2023-02-27 DIAGNOSIS — J849 Interstitial pulmonary disease, unspecified: Secondary | ICD-10-CM | POA: Diagnosis not present

## 2023-02-27 MED ORDER — PREDNISONE 10 MG PO TABS: ORAL_TABLET | ORAL | 0 refills | Status: AC

## 2023-02-27 NOTE — Progress Notes (Signed)
Synopsis: Referred in for ILD by Barry BrunnerLee, Duron Allah, MD  Assessment & Plan:   1. ILD   He is presenting for the evaluation of findings of ILD on chest CT (10/2022). I have reviewed his imaging with multiple chest CT's notable for ground glass opacities as well as subpleural reticulation. There appears to be a gradient of increased reticulation in a cranio-caudal direction. No honeycombs noted on my review. A CT of the abdomen and plevis from 2020 has some lung cuts with normal appearing lung tissue. High resolution CT scan of the chest performed on 02/19/2023 showed a pulmonary parenchymal pattern of fibrosis slightly more organized from prior, with a differential that includes fibrotic NSIP vs fibrotic HSP. UIP is less likely. Patient does report having had severe influenza with resultant ARDS which could account for some of the findings.   Auto-immune workup resulted with a mildly positive ANA (1:320) but specific antibody testing was negative. Myositis workup and hypersensitivity panel were negative, as was ANCA testing.  I discussed with the patient at length his different options.  This includes surgical lung biopsy, versus empiric prednisone and repeat imaging, versus surveillance imaging without treatment.  I also discussed with him possible bronchoscopy.  I discussed the risks and benefits of each of these scenarios including risk of bleeding, pneumothorax, bronchopleural fistula, wound dehiscence, and risk of anesthesia.  Patient prefers not to undergo invasive procedures at this moment.  I am going to start him on empiric prednisone with a prolonged course (40 mg for a week, 30 mg for 2 weeks, 20 mg for 2 weeks, 10 mg for 2 weeks) and then repeat high-resolution chest CT to assess for any interval changes. I will also present his case at the multi-disciplinary ILD conference.  Return in about 3 months (around 05/29/2023).  I spent 30 minutes caring for this patient today, including preparing  to see the patient, obtaining a medical history , reviewing a separately obtained history, performing a medically appropriate examination and/or evaluation, counseling and educating the patient/family/caregiver, ordering medications, tests, or procedures, documenting clinical information in the electronic health record, and independently interpreting results (not separately reported/billed) and communicating results to the patient/family/caregiver  Raechel ChuteKhabib Saamiya Jeppsen, MD [facility]   End of visit medications:  Meds ordered this encounter  Medications   predniSONE (DELTASONE) 10 MG tablet    Sig: Take 4 tablets (40 mg total) by mouth daily with breakfast for 7 days, THEN 3 tablets (30 mg total) daily with breakfast for 14 days, THEN 2 tablets (20 mg total) daily with breakfast for 14 days, THEN 1 tablet (10 mg total) daily with breakfast for 14 days.    Dispense:  112 tablet    Refill:  0     Current Outpatient Medications:    Emollient (AVEENO ACTIVE NATURALS DAILY) LOTN, Apply 1 Application topically in the morning and at bedtime., Disp: 198 g, Rfl: 0   ferrous sulfate 325 (65 FE) MG tablet, Take by mouth., Disp: , Rfl:    gabapentin (NEURONTIN) 300 MG capsule, Take 300 mg by mouth 3 (three) times daily., Disp: , Rfl:    lidocaine (XYLOCAINE) 5 % ointment, Apply 1 Application topically 3 (three) times daily as needed for mild pain., Disp: , Rfl:    predniSONE (DELTASONE) 10 MG tablet, Take 4 tablets (40 mg total) by mouth daily with breakfast for 7 days, THEN 3 tablets (30 mg total) daily with breakfast for 14 days, THEN 2 tablets (20 mg total)  daily with breakfast for 14 days, THEN 1 tablet (10 mg total) daily with breakfast for 14 days., Disp: 112 tablet, Rfl: 0   rivaroxaban (XARELTO) 20 MG TABS tablet, Take 1 tablet (20 mg total) by mouth daily with supper., Disp: 30 tablet, Rfl: 3   Subjective:   PATIENT ID: Lynelle SmokeMiguel Sokolowski GENDER: male DOB: 1950/12/15, [name]:  [id number]  Chief Complaint  Patient presents with   Follow-up    No current sx.    HPI  Mr. Mariah MillingMorales is a pleasant 72 year old male with a past medical history of ILD who presents to clinic for an evaluation of ILD. Patient was interviewed in the presence of a live spanish interpretor.  Symptoms are stable, continues to have shortness of breath and cough productive of whitish sputum, but this is unchanged compared to prior. ILD questionnaire filled during today's visit, no other exposures besides his history of working in construction/HVAC maintenance.  He feels that he is overall healthy. He does not have any joint pains or swelling, and denies any rashes. 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   Review of the medical record, however, is notable for multiple visits to pulmonology. The [facility] with Dr. Karna ChristmasAleskerov. He was last seen there in July of 2022. The patient was seen by pulmonologists at [facility] dating back to 2017. Review of the record notes a diagnosis of pulmonary fibrosis, and it was attributed to a previous bout with ARDS secondary to influenza.  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 He moved to [place] in his 20's to play football, before immigrating to the [place]. 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 He is a lifelong non-smoker and enjoys Salsa dancing.  Ancillary information including prior medications, full medical/surgical/family/social histories, and PFTs (when available) are listed below and have been reviewed.   Review of Systems  Constitutional:  Negative for chills, fever, malaise/fatigue and weight loss.  Respiratory:   Negative for cough, hemoptysis, sputum production, shortness of breath and wheezing.   Cardiovascular:  Negative for chest pain.     Objective:   Vitals:   02/27/23 1032  BP: 118/70  Pulse: 100  Temp: 98 F (36.7 C)  TempSrc: Temporal  SpO2: 94%  Weight: 197 lb 9.6 oz (89.6 kg)  Height: 5\' 10"  (1.778 m)   94% on RA BMI Readings from Last 3 Encounters:  02/27/23 28.35 kg/m  01/19/23 27.92 kg/m  12/19/22 28.32 kg/m   Wt Readings from Last 3 Encounters:  02/27/23 197 lb 9.6 oz (89.6 kg)  01/19/23 194 lb 9.6 oz (88.3 kg)  12/19/22 197 lb 6.4 oz (89.5 kg)    Physical Exam Constitutional:      Appearance: Normal appearance. He is not ill-appearing.  HENT:     Head: Normocephalic.     Nose: Nose normal.     Mouth/Throat:     Mouth: Mucous membranes are moist.  Cardiovascular:     Rate and Rhythm: Normal rate and regular rhythm.     Pulses: Normal pulses.     Heart sounds: Normal heart sounds.  Pulmonary:     Effort: Pulmonary effort is normal.     Breath sounds: Rales present.  Abdominal:     Palpations: Abdomen is soft.  Musculoskeletal:     Right lower leg: No edema.     Left lower leg: No edema.  Neurological:     General: No focal deficit present.     Mental Status: He is alert and oriented to person, place, and time. Mental status is at baseline.     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   DVT (deep venous thrombosis)    pt had surgery to remove clot on right leg   Hyperlipidemia    Hypertension    Skin cancer of scalp    surgery 12/13/2022     Family History  Problem Relation Age of Onset   Prostate cancer Neg Hx    Bladder Cancer Neg Hx    Kidney cancer Neg Hx      Past Surgical History:  Procedure Laterality Date   BACK SURGERY     COLONOSCOPY WITH PROPOFOL N/A 11/05/2019   Procedure: COLONOSCOPY WITH PROPOFOL;  Surgeon: Wyline Mood, MD;  Location:  [facility] ENDOSCOPY;  Service: Gastroenterology;  Laterality: N/A;   IVC FILTER REMOVAL N/A 11/09/2022   Procedure: IVC FILTER REMOVAL;  Surgeon: Annice Needy, MD;  Location: [facility] INVASIVE CV LAB;  Service: Cardiovascular;  Laterality: N/A;   MOHS SURGERY  12/13/2022   scalp of head   PARTIAL COLECTOMY N/A 02/11/2020   Procedure: PARTIAL COLECTOMY -- sigmoid;  Surgeon: Henrene Dodge, MD;  Location: [facility] ORS;  Service: General;  Laterality: N/A;   PERIPHERAL VASCULAR THROMBECTOMY Right 10/07/2022   Procedure: PERIPHERAL VASCULAR THROMBECTOMY;  Surgeon: Annice Needy, MD;  Location: [facility] CV [facility];  Service: Cardiovascular;  Laterality: Right;   PERIPHERAL VASCULAR THROMBECTOMY N/A 10/27/2022   Procedure: PERIPHERAL VASCULAR THROMBECTOMY;  Surgeon: Annice Needy, MD;  Location: [facility];  Service: Cardiovascular;  Laterality: N/A;   PERIPHERAL VASCULAR THROMBECTOMY Right 11/07/2022   Procedure: PERIPHERAL VASCULAR THROMBECTOMY;  Surgeon: Annice Needy, MD;  Location: [facility];  Service: Cardiovascular;  Laterality: Right;   PERIPHERAL VASCULAR THROMBECTOMY N/A 11/09/2022   Procedure: PERIPHERAL VASCULAR THROMBECTOMY;  Surgeon: Annice Needy, MD;  Location: [facility];  Service: Cardiovascular;  Laterality: N/A;  Inferior Vena Cava Thrombectomy   PERIPHERAL VASCULAR THROMBECTOMY Bilateral 11/28/2022   Procedure: PERIPHERAL VASCULAR THROMBECTOMY;  Surgeon: Annice Needy, MD;  Location: [facility];  Service: Cardiovascular;  Laterality: Bilateral;   SPINE SURGERY     TAKE DOWN OF INTESTINAL FISTULA N/A 02/11/2020   Procedure: TAKE DOWN OF INTESTINAL FISTULA -- colovesical fistula;  Surgeon: Henrene Dodge, MD;  Location: [facility] ORS;  Service: General;  Laterality: N/A;   TRACHEAL SURGERY      Social History   Socioeconomic History   Marital status: Married    Spouse name: Not on file   Number of children: Not on file   Years of education: Not on file    Highest education level: Not on file  Occupational History   Not on file  Tobacco Use   Smoking status: Never   Smokeless tobacco: Never  Vaping Use   Vaping Use: Never used  Substance and Sexual Activity   Alcohol use: No   Drug use: No   Sexual activity: Yes    Birth control/protection: None  Other Topics Concern   Not on file  Social History Narrative  Not on file   Social Determinants of Health   Financial Resource Strain: Not on file  Food Insecurity: No Food Insecurity (11/28/2022)   Hunger Vital Sign    Worried About Running Out of Food in the Last Year: Never true    Ran Out of Food in the Last Year: Never true  Transportation Needs: No Transportation Needs (11/28/2022)   PRAPARE - Administrator, Civil Service (Medical): No    Lack of Transportation (Non-Medical): No  Physical Activity: Not on file  Stress: Not on file  Social Connections: Not on file  Intimate Partner Violence: Not At Risk (11/28/2022)   Humiliation, Afraid, Rape, and Kick questionnaire    Fear of Current or Ex-Partner: No    Emotionally Abused: No    Physically Abused: No    Sexually Abused: No     Allergies  Allergen Reactions   Penicillins Swelling   Shrimp [Shellfish Allergy] Hives   Cephalosporins Hives and Itching   Cisatracurium Other (See Comments)    Other reaction(s): Unknown Other reaction(s): Unknown    Heparin Hives and Swelling    Patient's daughter reports patient had swelling/hives during hospital admission in [place]. They were on several medications at once so she is unsure whether it was VANCOMYCIN that caused this reaction or another concomitant medication.    Rocuronium Other (See Comments)    Other reaction(s): Unknown Other reaction(s): Unknown    Vancomycin Hives and Swelling    Patient's daughter reports patient had swelling/hives during hospital admission in [place]. They were on several medications at once so she is unsure whether it was VANCOMYCIN that  caused this reaction or another concomitant medication.    Wound Dressings Rash    Tegaderm      CBC    Component Value Date/Time   WBC 4.2 01/23/2023 1002   WBC 4.4 11/30/2022 0553   RBC 4.93 01/23/2023 1002   RBC 3.11 (L) 11/30/2022 0553   HGB 12.6 (L) 01/23/2023 1002   HCT 41.1 01/23/2023 1002   PLT 171 01/23/2023 1002   MCV 83 01/23/2023 1002   MCH 25.6 (L) 01/23/2023 1002   MCH 27.0 11/30/2022 0553   MCHC 30.7 (L) 01/23/2023 1002   MCHC 30.8 11/30/2022 0553   RDW 20.2 (H) 01/23/2023 1002   LYMPHSABS 1.5 01/23/2023 1002   MONOABS 0.5 11/27/2022 1442   EOSABS 0.4 01/23/2023 1002   BASOSABS 0.1 01/23/2023 1002    Pulmonary Functions Testing Results:     No data to display          Outpatient Medications Prior to Visit  Medication Sig Dispense Refill   Emollient (AVEENO ACTIVE NATURALS DAILY) LOTN Apply 1 Application topically in the morning and at bedtime. 198 g 0   ferrous sulfate 325 (65 FE) MG tablet Take by mouth.     gabapentin (NEURONTIN) 300 MG capsule Take 300 mg by mouth 3 (three) times daily.     lidocaine (XYLOCAINE) 5 % ointment Apply 1 Application topically 3 (three) times daily as needed for mild pain.     rivaroxaban (XARELTO) 20 MG TABS tablet Take 1 tablet (20 mg total) by mouth daily with supper. 30 tablet 3   No facility-administered medications prior to visit.

## 2023-03-07 ENCOUNTER — Encounter (INDEPENDENT_AMBULATORY_CARE_PROVIDER_SITE_OTHER): Payer: Self-pay | Admitting: Vascular Surgery

## 2023-03-07 ENCOUNTER — Ambulatory Visit (INDEPENDENT_AMBULATORY_CARE_PROVIDER_SITE_OTHER): Payer: Medicare Other

## 2023-03-07 ENCOUNTER — Ambulatory Visit (INDEPENDENT_AMBULATORY_CARE_PROVIDER_SITE_OTHER): Payer: Medicare Other | Admitting: Vascular Surgery

## 2023-03-07 VITALS — BP 114/72 | HR 83 | Resp 16 | Wt 199.0 lb

## 2023-03-07 DIAGNOSIS — I82401 Acute embolism and thrombosis of unspecified deep veins of right lower extremity: Secondary | ICD-10-CM

## 2023-03-07 DIAGNOSIS — I82413 Acute embolism and thrombosis of femoral vein, bilateral: Secondary | ICD-10-CM

## 2023-03-07 DIAGNOSIS — I10 Essential (primary) hypertension: Secondary | ICD-10-CM

## 2023-03-07 DIAGNOSIS — E782 Mixed hyperlipidemia: Secondary | ICD-10-CM

## 2023-03-07 NOTE — Assessment & Plan Note (Signed)
blood pressure control important in reducing the progression of atherosclerotic disease. On appropriate oral medications.  

## 2023-03-07 NOTE — Progress Notes (Signed)
[name] : [id number]  Russell [name] is a 72 y.o. ([birth year]) male who presents with chief complaint of  Chief Complaint  Patient presents with   Follow-up    Ultrasound follow up  .  History of Present Illness: Patient returns today in follow up of venous disease.  The history is obtained with the help of a live Spanish interpreter.  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  His leg swelling is much better.  Has remained active and has been elevating his legs.  He remains on Xarelto anticoagulation and says he has been compliant with this.  No chest pain or shortness of breath.  He has an upcoming long trip that he is concerned about.  Duplex today shows some recanalization of the chronic thrombus in the right common femoral vein with only chronic appearing thrombus in the common femoral vein and saphenofemoral junction.  He has seen hematology who is managing his anticoagulant.  Current Outpatient Medications  Medication Sig Dispense Refill   Emollient (AVEENO ACTIVE NATURALS DAILY) LOTN Apply 1 Application topically in the morning and at bedtime. 198 g 0   ferrous sulfate 325 (65 FE) MG tablet Take by mouth.     lidocaine (XYLOCAINE) 5 % ointment Apply 1 Application topically 3 (three) times daily as needed for mild pain.     predniSONE (DELTASONE) 10 MG tablet Take 4 tablets (40 mg total) by mouth daily with breakfast for 7 days, THEN 3 tablets (30 mg total) daily with breakfast for 14 days, THEN 2 tablets (20 mg total) daily with breakfast for 14 days, THEN 1 tablet (10 mg total) daily with breakfast for 14 days. 112 tablet 0   rivaroxaban (XARELTO) 20 MG TABS tablet Take 1 tablet (20 mg total) by mouth daily with supper. 30 tablet 3   gabapentin (NEURONTIN) 300 MG capsule Take 300 mg by mouth 3 (three) times daily. (Patient not taking: Reported on 03/07/2023)     No current facility-administered medications for this visit.    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   DVT (deep venous thrombosis)    pt had surgery to remove clot on right leg   Hyperlipidemia    Hypertension    Skin cancer of scalp    surgery 12/13/2022    Past Surgical History:  Procedure Laterality Date   BACK SURGERY     COLONOSCOPY WITH PROPOFOL N/A 11/05/2019   Procedure: COLONOSCOPY WITH PROPOFOL;  Surgeon: Wyline Mood, MD;  Location: [facility];  Service: Gastroenterology;  Laterality: N/A;   IVC FILTER REMOVAL N/A 11/09/2022   Procedure: IVC FILTER REMOVAL;  Surgeon: Annice Needy, MD;  Location: [facility] INVASIVE CV LAB;  Service: Cardiovascular;  Laterality: N/A;   MOHS SURGERY  12/13/2022   scalp of head   PARTIAL COLECTOMY N/A 02/11/2020   Procedure: PARTIAL COLECTOMY -- sigmoid;  Surgeon: Henrene Dodge, MD;  Location: [facility] ORS;  Service: General;  Laterality: N/A;   PERIPHERAL VASCULAR THROMBECTOMY Right 10/07/2022   Procedure: PERIPHERAL VASCULAR THROMBECTOMY;  Surgeon: Annice Needy, MD;  Location: [facility];  Service: Cardiovascular;  Laterality: Right;   PERIPHERAL VASCULAR THROMBECTOMY N/A 10/27/2022   Procedure: PERIPHERAL VASCULAR THROMBECTOMY;  Surgeon: Annice Needy, MD;  Location: [facility];  Service: Cardiovascular;  Laterality: N/A;   PERIPHERAL VASCULAR THROMBECTOMY Right 11/07/2022   Procedure: PERIPHERAL VASCULAR THROMBECTOMY;  Surgeon: Festus Barren  S, MD;  Location: [facility];  Service: Cardiovascular;  Laterality: Right;   PERIPHERAL VASCULAR THROMBECTOMY N/A 11/09/2022   Procedure: PERIPHERAL VASCULAR THROMBECTOMY;  Surgeon: Annice Needy, MD;  Location: [facility];  Service: Cardiovascular;  Laterality: N/A;  Inferior Vena Cava Thrombectomy   PERIPHERAL VASCULAR THROMBECTOMY Bilateral 11/28/2022   Procedure: PERIPHERAL VASCULAR THROMBECTOMY;  Surgeon: Annice Needy, MD;  Location: [facility];  Service: Cardiovascular;  Laterality: Bilateral;    SPINE SURGERY     TAKE DOWN OF INTESTINAL FISTULA N/A 02/11/2020   Procedure: TAKE DOWN OF INTESTINAL FISTULA -- colovesical fistula;  Surgeon: Henrene Dodge, MD;  Location: [facility] ORS;  Service: General;  Laterality: N/A;   TRACHEAL SURGERY       Social History   Tobacco Use   Smoking status: Never   Smokeless tobacco: Never  Vaping Use   Vaping Use: Never used  Substance Use Topics   Alcohol use: No   Drug use: No       Family History  Problem Relation Age of Onset   Prostate cancer Neg Hx    Bladder Cancer Neg Hx    Kidney cancer Neg Hx      Allergies  Allergen Reactions   Penicillins Swelling   Shrimp [Shellfish Allergy] Hives   Cephalosporins Hives and Itching   Cisatracurium Other (See Comments)    Other reaction(s): Unknown Other reaction(s): Unknown    Heparin Hives and Swelling    Patient's daughter reports patient had swelling/hives during hospital admission in [place]. They were on several medications at once so she is unsure whether it was VANCOMYCIN that caused this reaction or another concomitant medication.    Rocuronium Other (See Comments)    Other reaction(s): Unknown Other reaction(s): Unknown    Vancomycin Hives and Swelling    Patient's daughter reports patient had swelling/hives during hospital admission in [place]. They were on several medications at once so she is unsure whether it was VANCOMYCIN that caused this reaction or another concomitant medication.    Wound Dressings Rash    Tegaderm      REVIEW OF SYSTEMS (Negative unless checked)  Constitutional: [] Weight loss  [] Fever  [] Chills Cardiac: [] Chest pain   [] Chest pressure   [] Palpitations   [] Shortness of breath when laying flat   [] Shortness of breath at rest   [] Shortness of breath with exertion. Vascular:  [] Pain in legs with walking   [] Pain in legs at rest   [] Pain in legs when laying flat   [] Claudication   [] Pain in feet when walking  [] Pain in feet at rest  [] Pain in feet when  laying flat   [x] History of DVT   [] Phlebitis   [x] Swelling in legs   [] Varicose veins   [] Non-healing ulcers Pulmonary:   [] Uses home oxygen   [] Productive cough   [] Hemoptysis   [] Wheeze  [] COPD   [] Asthma Neurologic:  [] Dizziness  [] Blackouts   [] Seizures   [] History of stroke   [] History of TIA  [] Aphasia   [] Temporary blindness   [] Dysphagia   [] Weakness or numbness in arms   [] Weakness or numbness in legs Musculoskeletal:  [x] Arthritis   [] Joint swelling   [] Joint pain   [] Low back pain Hematologic:  [] Easy bruising  [] Easy bleeding   [] Hypercoagulable state   [] Anemic   Gastrointestinal:  [] Blood in stool   [] Vomiting blood  [] Gastroesophageal reflux/heartburn   [] Abdominal pain Genitourinary:  [] Chronic kidney disease   [] Difficult urination  [] Frequent urination  []   Burning with urination   [] Hematuria Skin:  [] Rashes   [] Ulcers   [] Wounds Psychological:  [] History of anxiety   []  History of major depression.  Physical Examination  BP 114/72 (BP Location: Right Arm)   Pulse 83   Resp 16   Wt 199 lb (90.3 kg)   BMI 28.55 kg/m  Gen:  WD/WN, NAD Head: [place]/AT, No temporalis wasting. Ear/Nose/Throat: Hearing grossly intact, nares w/o erythema or drainage Eyes: Conjunctiva clear. Sclera non-icteric Neck: Supple.  Trachea midline Pulmonary:  Good air movement, no use of accessory muscles.  Cardiac: RRR, no JVD Vascular:  Vessel Right Left  Radial Palpable Palpable                          PT Palpable Palpable  DP Palpable Palpable   Gastrointestinal: soft, non-tender/non-distended. No guarding/reflex.  Musculoskeletal: M/S 5/5 throughout.  No deformity or atrophy. 1+ RLE edema, No LLE edema. Neurologic: Sensation grossly intact in extremities.  Symmetrical.  Speech is fluent.  Psychiatric: Judgment intact, Mood & affect appropriate for pt's clinical situation. Dermatologic: No rashes or ulcers noted.  No cellulitis or open wounds.      Labs Recent Results (from the  past 2160 hour(s))  Comprehensive metabolic panel     Status: Abnormal   Collection Time: 01/23/23 10:02 AM  Result Value Ref Range   Glucose 94 70 - 99 mg/dL   BUN 15 8 - 27 mg/dL   Creatinine, Ser 1.61 0.76 - 1.27 mg/dL   eGFR 83 >09 UE/AVW/0.98   BUN/Creatinine Ratio 16 10 - 24   Sodium 143 134 - 144 mmol/L   Potassium 4.3 3.5 - 5.2 mmol/L   Chloride 109 (H) 96 - 106 mmol/L   CO2 18 (L) 20 - 29 mmol/L   Calcium 9.3 8.6 - 10.2 mg/dL   Total Protein 6.8 6.0 - 8.5 g/dL   Albumin 4.7 3.8 - 4.8 g/dL   Globulin, Total 2.1 1.5 - 4.5 g/dL   Albumin/Globulin Ratio 2.2 1.2 - 2.2   Bilirubin Total 0.4 0.0 - 1.2 mg/dL   Alkaline Phosphatase 84 44 - 121 IU/L   AST 11 0 - 40 IU/L   ALT 12 0 - 44 IU/L  CBC with Differential/Platelet     Status: Abnormal   Collection Time: 01/23/23 10:02 AM  Result Value Ref Range   WBC 4.2 3.4 - 10.8 x10E3/uL   RBC 4.93 4.14 - 5.80 x10E6/uL   Hemoglobin 12.6 (L) 13.0 - 17.7 g/dL   Hematocrit 11.9 14.7 - 51.0 %   MCV 83 79 - 97 fL   MCH 25.6 (L) 26.6 - 33.0 pg   MCHC 30.7 (L) 31.5 - 35.7 g/dL   RDW 82.9 (H) 56.2 - 13.0 %   Platelets 171 150 - 450 x10E3/uL   Neutrophils 42 Not Estab. %   Lymphs 36 Not Estab. %   Monocytes 11 Not Estab. %   Eos 10 Not Estab. %   Basos 1 Not Estab. %   Neutrophils Absolute 1.8 1.4 - 7.0 x10E3/uL   Lymphocytes Absolute 1.5 0.7 - 3.1 x10E3/uL   Monocytes Absolute 0.5 0.1 - 0.9 x10E3/uL   EOS (ABSOLUTE) 0.4 0.0 - 0.4 x10E3/uL   Basophils Absolute 0.1 0.0 - 0.2 x10E3/uL   Immature Granulocytes 0 Not Estab. %   Immature Grans (Abs) 0.0 0.0 - 0.1 x10E3/uL  Aldolase     Status: None   Collection Time: 01/23/23 10:03 AM  Result Value  Ref Range   Aldolase 4.1 3.3 - 10.3 U/L  ANA 12 Plus Profile (RDL)     Status: Abnormal   Collection Time: 01/23/23 10:03 AM  Result Value Ref Range   Anti-Nuclear Ab by IFA (RDL) Positive (A) Negative  ANCA Profile     Status: None   Collection Time: 01/23/23 10:03 AM  Result Value Ref  Range   Anti-MPO Antibodies <0.2 0.0 - 0.9 units   Anti-PR3 Antibodies <0.2 0.0 - 0.9 units   C-ANCA <1:20 Neg:<1:20 titer   P-ANCA <1:20 Neg:<1:20 titer    Comment: The presence of positive fluorescence exhibiting P-ANCA or C-ANCA patterns alone is not specific for the diagnosis of Wegener's Granulomatosis (WG) or microscopic polyangiitis. Decisions about treatment should not be based solely on ANCA IFA results.  The International ANCA Group Consensus recommends follow up testing of positive sera with both PR-3 and MPO-ANCA enzyme immunoassays. As many as 5% serum samples are positive only by EIA. Ref. AM J Clin Pathol 1999;111:507-513.    Atypical pANCA <1:20 Neg:<1:20 titer    Comment: The atypical pANCA pattern has been observed in a significant percentage of patients with ulcerative colitis, primary sclerosing cholangitis and autoimmune hepatitis.   Anti-CCP Ab, IgG + IgA (RDL)     Status: None   Collection Time: 01/23/23 10:03 AM  Result Value Ref Range   Anti-CCP Ab, IgG + IgA (RDL) <20 <20 Units    Comment:     Negative: <20             Weak Positive: 20-39     Moderate Positive: 40-59  Strong Positive: >59   CK     Status: None   Collection Time: 01/23/23 10:03 AM  Result Value Ref Range   Total CK 66 41 - 331 U/L  Hypersensitivity Pneumonitis     Status: None   Collection Time: 01/23/23 10:03 AM  Result Value Ref Range   A.Fumigatus #1 Abs Negative Negative   Micropolyspora faeni, IgG Negative Negative   Thermoactinomyces vulgaris, IgG Negative Negative   A. Pullulans Abs Negative Negative   Thermoact. Saccharii Negative Negative   Pigeon Serum Abs Negative Negative  MyoMarker 3 Plus Profile (RDL)     Status: None   Collection Time: 01/23/23 10:03 AM  Result Value Ref Range   Anti-Jo-1 Ab (RDL) <20 <20 Units   Anti-PL-7 Ab (RDL) Negative Negative   Anti-PL-12 Ab (RDL Negative Negative   Anti-EJ Ab (RDL) Negative Negative   Anti-OJ Ab (RDL) Negative Negative    Anti-SRP Ab (RDL) Negative Negative   Anti-Mi-2 Ab (RDL) Negative Negative   Anti-TIF-1gamma Ab (RDL) <20 <20 Units   Anti-MDA-5 Ab (CADM-140)(RDL) <20 <20 Units   Anti-NXP-2 (P140) Ab (RDL) <20 <20 Units   Anti-SAE1 Ab, IgG (RDL) <20 <20 Units   Anti-PM/Scl-100 Ab (RDL) <20 <20 Units   Anti-Ku Ab (RDL) Negative Negative   Anti-SS-A 52kD Ab, IgG (RDL) <20 <20 Units   Anti-U1 RNP Ab (RDL) <20 <20 Units   Anti-U2 RNP Ab (RDL) Negative Negative   Anti-U3 RNP (Fibrillarin)(RDL) Negative Negative    Comment:     Interpretation for Anti-Jo-1, Anti-TIF-1gamma,     Anti-MDA-5, Anti-NXP-2, Anti-SAE1, Anti-PM/Scl-100,     Anti-SS-A 52 kD, Anti-U1 RNP:         Negative:                                <20  Weak Positive:                       20 - 39         Moderate Positive:                   40 - 80         Strong Positive:                         >80   Rheumatoid factor     Status: None   Collection Time: 01/23/23 10:03 AM  Result Value Ref Range   Rheumatoid fact SerPl-aCnc <10.0 <14.0 IU/mL  ANA 12 Plus Profile, Positive     Status: Abnormal   Collection Time: 01/23/23 10:03 AM  Result Value Ref Range   Speckled Pattern 1:320 (H) <1:40   Midbody Pattern 1:320 (H) <1:40   Note: Comment     Comment: ANA performed by Indirect Fluorescent Antibody (IFA)   Anti-Centromere Ab (RDL) <1:40 <1:40   Anti-dsDNA Ab by Farr(RDL) <8.0 <8.0 IU/mL   Anti-Sm Ab (RDL) <20 <20 Units   Anti-Ro (SS-A) Ab (RDL) <20 <20 Units   Anti-La (SS-B) Ab (RDL) <20 <20 Units   Anti-Scl-70 Ab (RDL) <20 <20 Units   Anti-Cardiolipin Ab, IgG (RDL) <15 <15 GPL U/mL   Anti-Cardiolipin Ab, IgA (RDL) <12 <12 APL U/mL   Anti-Cardiolipin Ab, IgM (RDL) 19 <13 MPL U/mL   C3 Complement (RDL) 114 82 - 167 mg/dL   C4 Complement (RDL) 20 14 - 44 mg/dL   Anti-TPO Ab (RDL) <4.7 <9.0 IU/mL   Anti-Chromatin Ab, IgG (RDL) <20 <20 Units   Anti-CCP Ab, IgG & IgA (RDL) CANCELED Units    Comment: Duplicate procedure  ordered.  Result canceled by the ancillary.    Rheumatoid Factor by Turb RDL CANCELED IU/mL    Comment: Duplicate procedure ordered.  Result canceled by the ancillary.    ANA Plus 12 Interpretation Comment     Comment:    Interpretation for Anti-Sm, Anti-U1 RNP, Anti-Ro,     Anti-La:         Negative:                                <20         Weak Positive:                       20 - 39         Moderate Positive:                   40 - 80         Strong Positive:                         >80    Interpretation for Anti-CCP Ab, IgG / IgA:         Negative:                                <20         Weak Positive:                       20 - 39  Moderate Positive:                   40 - 59         Strong Positive:                         >59    Interpretation for Anti-Cardiolipin Ab:     Negative:               GPL <15, APL <12, MPL <13     Indeterminate:    GPL 15-20, APL 12-20, MPL 13-20     Low Positive:           GPL, APL, MPL    >20 - 40     Med Positive:           GPL, APL, MPL    >40 - 80     High Positive:          GPL, APL, MPL         >80 SLE classification criteria are based on Med to High titer (>40) Anti-Cardiolipin Ab (aCL). aCL may be elevated transiently with cer tain infections and may increase spuriously in the presence of rheumatoid factor.     Radiology CT CHEST HIGH RESOLUTION  Result Date: 02/19/2023 CLINICAL DATA:  Interstitial lung disease. EXAM: CT CHEST WITHOUT CONTRAST TECHNIQUE: Multidetector CT imaging of the chest was performed following the standard protocol without intravenous contrast. High resolution imaging of the lungs, as well as inspiratory and expiratory imaging, was performed. RADIATION DOSE REDUCTION: This exam was performed according to the departmental dose-optimization program which includes automated exposure control, adjustment of the mA and/or kV according to patient size and/or use of iterative reconstruction technique.  COMPARISON:  11/05/2022, 10/06/2022, 06/11/2022. FINDINGS: Cardiovascular: Atherosclerotic calcification of the aorta, aortic valve and coronary arteries. Enlarged pulmonic trunk and heart. No pericardial effusion. Mediastinum/Nodes: Mediastinal lymph nodes measure up to 10 mm in the low right paratracheal station, similar. Hilar regions are difficult to evaluate without IV contrast. No axillary adenopathy. Esophagus is grossly unremarkable. Lungs/Pleura: Interstitial and subpleural coarsening with ground-glass and traction bronchiectasis/bronchiolectasis, overall more organized than on comparison exams. 4 mm left lower lobe nodule (5/76), unchanged from 06/11/2022. No pleural fluid. Debris is seen in the airway. No air trapping. Upper Abdomen: Visualized portion of the liver is unremarkable. Stone in the gallbladder. Adrenal glands and visualized portions of the kidneys, spleen, pancreas, stomach and bowel are grossly unremarkable with the exception of a small hiatal hernia. No upper abdominal adenopathy. Partially imaged IVC filter. Musculoskeletal: C7-T11 posterior fusion with a metallic strut device spanning T3-T5. Old rib fractures. IMPRESSION: 1. 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 Fibrotic hypersensitivity pneumonitis is considered less likely given the absence of air trapping. Findings are indeterminate for UIP per consensus guidelines: Diagnosis of Idiopathic Pulmonary Fibrosis: An Official ATS/ERS/JRS/ALAT Clinical Practice Guideline. Am Rosezetta Schlatter Crit Care Med Vol 198, Iss 5, [phone], Jul 22 2017. 2. Borderline enlarged mediastinal lymph nodes, likely reactive. 3. 4 mm left lower lobe nodule, stable from 06/11/2022. Additional follow-up could be obtained in 1 year to ensure stability. This recommendation follows the consensus statement: Guidelines for Management of Small Pulmonary  Nodules Detected on CT Images: From the Fleischner Society 2017; Radiology 2017; 284:228-243. 4. Cholelithiasis. 5. Aortic atherosclerosis (ICD10-I70.0). Coronary artery calcification. 6. Enlarged pulmonic trunk, indicative of pulmonary  arterial hypertension. Electronically Signed   By: Leanna Battles M.D.   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  Will remain on chronic anticoagulation.  His symptoms are currently very well-controlled.  He follows with hematology.  At this point, I can see him back as needed.  HTN (hypertension) blood pressure control important in reducing the progression of atherosclerotic disease. On appropriate oral medications.   Hyperlipidemia, mixed lipid control important in reducing the progression of atherosclerotic disease.     Festus Barren, MD  03/07/2023 12:30 PM    This note was created with Dragon medical transcription system.  Any errors from dictation are purely unintentional

## 2023-03-07 NOTE — Assessment & Plan Note (Signed)
Duplex today shows some recanalization of the chronic thrombus in the right common femoral vein with only chronic appearing thrombus in the common femoral vein and saphenofemoral junction.  Will remain on chronic anticoagulation.  His symptoms are currently very well-controlled.  He follows with hematology.  At this point, I can see him back as needed.

## 2023-03-07 NOTE — Assessment & Plan Note (Addendum)
lipid control important in reducing the progression of atherosclerotic disease.   

## 2023-04-04 ENCOUNTER — Telehealth: Payer: Self-pay

## 2023-04-04 ENCOUNTER — Encounter: Payer: Self-pay | Admitting: *Deleted

## 2023-04-04 ENCOUNTER — Ambulatory Visit: Payer: Self-pay | Admitting: Internal Medicine

## 2023-04-04 NOTE — Telephone Encounter (Signed)
Received surgical assessment form from [facility]. Patient is scheduled for total shoulder 5/21. Patient last seen 02/27/2023.  Form placed in Dr. Aundria Rud folder.   Dr. Aundria Rud, please advise if assessment can be made off of last OV or if patient needs OV.  Please create encounter with assessment or add to last OV note.

## 2023-04-06 ENCOUNTER — Telehealth: Payer: Self-pay | Admitting: Student in an Organized Health Care Education/Training Program

## 2023-04-06 NOTE — Telephone Encounter (Signed)
Please see 04/04/2023 phone note.   Margaret with [facility] is aware that Dr. Aundria Rud will be in office today and will address surgical assessment. Surgery is scheduled for 04/11/2023.  Routing to Dr. Aundria Rud as an Lorain Childes.

## 2023-04-06 NOTE — Telephone Encounter (Signed)
Margaret with [facility] is aware that patient is currently taking daily prednisone. She will let surgeon know.

## 2023-04-06 NOTE — Telephone Encounter (Signed)
Margaret calling from [facility] wondering if we got the from she fax'd for pulmonary clearance form.  Please call @ [phone]  Sending High Priority due to time sensitive

## 2023-04-07 NOTE — Telephone Encounter (Addendum)
Assessment faxed to [facility]. Successful fax confirmation received.  Nothing further needed.

## 2023-04-07 NOTE — Telephone Encounter (Signed)
Spoke to [place] with [facility] and verified that assessment was received.

## 2023-04-07 NOTE — Telephone Encounter (Signed)
Please refer to 04/06/2023 phone note.  Lm for Claris Che with [facility] to confirm that surgery is still on since patient is on prednisone.

## 2023-04-07 NOTE — Telephone Encounter (Signed)
Spoke to [place]. She stated that she spoke with surgeon is surgery is still on.   Dr. Aundria Rud, please advise on assessment has been completed. Thanks

## 2023-04-07 NOTE — Telephone Encounter (Signed)
Apr 07, 2023 Russell [name], [place]      04/07/23  2:14 PM Note Spoke to [place] with [facility] and verified that assessment was received.        Russell [name], CMA      04/07/23 10:28 AM Note Assessment faxed to [facility]. Successful fax confirmation received.  Nothing further needed.

## 2023-04-07 NOTE — Progress Notes (Signed)
Interstitial Lung Disease Multidisciplinary Conference   Russell [name]    [name] [id number]    DOB [birth year]  Primary Care Physician:System, Provider Not In  Referring Physician: Dr. Aundria Rud  Time of Conference: 7.00am- 8.00am Date of conference: 04/04/2023 Location of Conference: -  Virtual  Participating Pulmonary: Dr. Kalman Shan, Dr Chilton Greathouse, Dr. Melody Comas Pathology:  Radiology: Dr Cleone Slim  Others:   Brief History: 73 year old male referred due to findings of fibrosis on CT. Previously had it documented by imaging, appears progressed on repeat. History of working in [employer]. Also reports a history of having very bad influenza with ARDS. ILD workup only showed a mildly positive ANA (1:320) otherwise testing was negative and no other exposures on ILD questionnaire.    PFT     No data to display            MDD discussion of CT scan    - Date or time period of scan:  HRCT: 02/17/2023 and baseline June 11, 2022   - Discussion synopsis:  Baseline 2023 scan - has lot of GGO in bases. In recent scan; Moderate fibrotic ILD with supleural reticulation. GGO has decreased and fibrosis worse. Honeycombing now + though mild. No AT. No gradient.   - What is the final conclusion per 2018 ATS/Fleischner Criteria -  Favors fibrotic NSIP or UIP. 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 So shared decisiom making of anti-fibrotic or watch   Time Spent in preparation and discussion:  > 30 min    SIGNATURE   Dr. Kalman Shan, M.D., F.C.C.P,  Pulmonary and Critical Care Medicine Staff Physician, [facility] Center Director - Interstitial Lung Disease  Program  Pulmonary Fibrosis [facility] at [facility] [place], [place], [address]  Pager: [phone], If no answer or between  15:00h - 7:00h: call [phone] Telephone: [phone]  12:34 PM 04/07/2023 ...................................................................................................................Marland Kitchen References: Diagnosis of Hypersensitivity Pneumonitis in Adults. An Official ATS/JRS/ALAT Clinical Practice Guideline. Ragu G et al, Am J Respir Crit Care Med. 2020 Aug 1;202(3):e36-e69.       Diagnosis of Idiopathic Pulmonary Fibrosis. An Official ATS/ERS/JRS/ALAT Clinical Practice Guideline. Raghu G et al, Am J Respir Crit Care Med. 2018 Sep 1;198(5):e44-e68.   IPF Suspected   Histopath ology Pattern      UIP  Probable UIP  Indeterminate for  UIP  Alternative  diagnosis    UIP  IPF  IPF  IPF  Non-IPF dx   HRCT   Probabe UIP  IPF  IPF  IPF (Likely)**  Non-IPF dx  Pattern  Indeterminate for UIP  IPF  IPF (Likely)**  Indeterminate  for IPF**  Non-IPF dx    Alternative diagnosis  IPF (Likely)**/ non-IPF dx  Non-IPF dx  Non-IPF dx  Non-IPF dx     Idiopathic pulmonary fibrosis diagnosis based upon HRCT and Biopsy paterns.  ** IPF is the likely diagnosis when any of following features are present:  Moderate-to-severe traction bronchiectasis/bronchiolectasis (defined as mild traction bronchiectasis/bronchiolectasis in four or more lobes including the lingual as a lobe, or moderate to severe traction bronchiectasis in two or more lobes) in a man over age 44 years or in a woman over age 80 years Extensive (>30%) reticulation on  HRCT and an age >70 years  Increased neutrophils and/or absence of lymphocytosis in BAL fluid  Multidisciplinary discussion reaches a confident diagnosis of IPF.   **Indeterminate for IPF  Without an adequate biopsy is unlikely to be IPF  With an adequate biopsy may be reclassified to a more specific diagnosis after multidisciplinary discussion and/or  additional consultation.   dx = diagnosis; HRCT = high-resolution computed tomography; IPF = idiopathic pulmonary fibrosis; UIP = usual interstitial pneumonia.

## 2023-04-19 ENCOUNTER — Inpatient Hospital Stay: Payer: Medicare Other

## 2023-04-19 ENCOUNTER — Inpatient Hospital Stay: Payer: Medicare Other | Admitting: Oncology

## 2023-04-20 ENCOUNTER — Other Ambulatory Visit: Payer: Self-pay

## 2023-04-20 ENCOUNTER — Encounter: Payer: Self-pay | Admitting: *Deleted

## 2023-04-20 ENCOUNTER — Emergency Department
Admission: EM | Admit: 2023-04-20 | Discharge: 2023-04-20 | Disposition: A | Discharge status: Home / Self Care | Payer: Medicare Other | Attending: Emergency Medicine | Admitting: Emergency Medicine

## 2023-04-20 DIAGNOSIS — R509 Fever, unspecified: Secondary | ICD-10-CM | POA: Diagnosis not present

## 2023-04-20 LAB — BASIC METABOLIC PANEL
Anion gap: 9 (ref 5–15)
BUN: 27 mg/dL — ABNORMAL HIGH (ref 8–23)
CO2: 21 mmol/L — ABNORMAL LOW (ref 22–32)
Calcium: 9 mg/dL (ref 8.9–10.3)
Chloride: 108 mmol/L (ref 98–111)
Creatinine, Ser: 1.1 mg/dL (ref 0.61–1.24)
GFR, Estimated: >60 mL/min (ref >60)
Glucose, Bld: 118 mg/dL — ABNORMAL HIGH (ref 70–99)
Potassium: 3.9 mmol/L (ref 3.5–5.1)
Sodium: 138 mmol/L (ref 135–145)

## 2023-04-20 LAB — CBC
HCT: 44.1 % (ref 39.0–52.0)
Hemoglobin: 14.1 g/dL (ref 13.0–17.0)
MCH: 28.8 pg (ref 26.0–34.0)
MCHC: 32 g/dL (ref 30.0–36.0)
MCV: 90.2 fL (ref 80.0–100.0)
Platelets: 236 10*3/uL (ref 150–400)
RBC: 4.89 MIL/uL (ref 4.22–5.81)
RDW: 18.3 % — ABNORMAL HIGH (ref 11.5–15.5)
WBC: 7.3 10*3/uL (ref 4.0–10.5)
nRBC: 0 % (ref 0.0–0.2)

## 2023-04-20 NOTE — ED Notes (Signed)
Pt verbalizes understanding of discharge instructions. Opportunity for questioning and answers were provided. Pt discharged from ED to home with wife.    

## 2023-04-20 NOTE — ED Provider Notes (Signed)
[facility] Provider Note    Event Date/Time   First MD Initiated Contact with Patient 04/20/23 2118     (approximate)   History   Fever   HPI  Nischal Och is a 72 y.o. male who presents to the emergency department today because of concerns for possible wound infection.  Patient states that he had shoulder surgery a little over a week ago.  For the past 3 days he feels like he has had a fever.  Tmax of 100.  He is concerned that the infection is in his shoulder.  He has not contacted his surgeon about this.  States he has taken Tylenol although did not take any prior to coming to the hospital today.   Physical Exam   Triage Vital Signs: ED Triage Vitals  Enc Vitals Group     BP 04/20/23 1834 (!) 119/90     Pulse Rate 04/20/23 1831 (!) 107     Resp 04/20/23 1831 20     Temp 04/20/23 1831 98.2 F (36.8 C)     Temp Source 04/20/23 1831 Oral     SpO2 04/20/23 1834 96 %     Weight 04/20/23 1833 200 lb (90.7 kg)     Height 04/20/23 1833 5\' 10"  (1.778 m)     Head Circumference --      Peak Flow --      Pain Score 04/20/23 1832 5     Pain Loc --      Pain Edu? --      Excl. in GC? --     Most recent vital signs: Vitals:   04/20/23 1834 04/20/23 2125  BP: (!) 119/90 (!) 114/54  Pulse:  83  Resp:  18  Temp:  98 F (36.7 C)  SpO2: 96% 97%   General: Awake, alert, oriented. CV:  Good peripheral perfusion. Regular rate and rhythm. Resp:  Normal effort. Lungs clear. Abd:  No distention.  Other:  Left shoulder incision intact. No warmth. No surrounding erythema, slight redness around some sutures.    ED Results / Procedures / Treatments   Labs (all labs ordered are listed, but only abnormal results are displayed) Labs Reviewed  BASIC METABOLIC PANEL - Abnormal; Notable for the following components:      Result Value   CO2 21 (*)    Glucose, Bld 118 (*)    BUN 27 (*)    All other components within normal limits  CBC - Abnormal; Notable  for the following components:   RDW 18.3 (*)    All other components within normal limits     EKG  None   RADIOLOGY None   PROCEDURES:  Critical Care performed: No  MEDICATIONS ORDERED IN ED: Medications - No data to display   IMPRESSION / MDM / ASSESSMENT AND PLAN / ED COURSE  I reviewed the triage vital signs and the nursing notes.                              Differential diagnosis includes, but is not limited to, infection  Patient's presentation is most consistent with acute presentation with potential threat to life or bodily function.   Patient presented to the emergency department today because of concerns for fevers and possible infection of his left shoulder.  Patient afebrile here.  Blood work without concerning leukocytosis.  Surgical wound clean dry and intact.  There is very minimal redness around  a couple of the sutures but no surrounding erythema and no warmth.  This time it appears to be routine healing.  I did discuss with the patient that at this time I have low suspicion for infection.  I did recommend that he follow-up with his surgeons given concerns.     FINAL CLINICAL IMPRESSION(S) / ED DIAGNOSES   Final diagnoses:  Fever, unspecified fever cause     Note:  This document was prepared using Dragon voice recognition software and may include unintentional dictation errors.    Phineas Semen, MD 04/20/23 2220

## 2023-04-20 NOTE — ED Triage Notes (Addendum)
Pt ambulatory to triage   pt had left shoulder surgery this week.  Pt reports fever for 3 days.  Pt wearing a sling on left arm.  Pt has staples in left shoulder which has some redness.  No drainage noted.   Pt alert  pt has not taken tylenol or motrin today. Interpreter on a stick used in triage.

## 2023-04-20 NOTE — Discharge Instructions (Signed)
Please seek medical attention for any high fevers, chest pain, shortness of breath, change in behavior, persistent vomiting, bloody stool or any other new or concerning symptoms.  

## 2023-04-28 ENCOUNTER — Inpatient Hospital Stay (HOSPITAL_BASED_OUTPATIENT_CLINIC_OR_DEPARTMENT_OTHER): Payer: Medicare Other | Admitting: Oncology

## 2023-04-28 ENCOUNTER — Encounter: Payer: Self-pay | Admitting: Oncology

## 2023-04-28 ENCOUNTER — Inpatient Hospital Stay: Payer: Medicare Other | Attending: Oncology

## 2023-04-28 VITALS — BP 106/82 | HR 69 | Temp 98.1°F | Resp 18 | Ht 70.0 in | Wt 200.0 lb

## 2023-04-28 DIAGNOSIS — Z7901 Long term (current) use of anticoagulants: Secondary | ICD-10-CM | POA: Insufficient documentation

## 2023-04-28 DIAGNOSIS — Z86718 Personal history of other venous thrombosis and embolism: Secondary | ICD-10-CM | POA: Insufficient documentation

## 2023-04-28 DIAGNOSIS — D649 Anemia, unspecified: Secondary | ICD-10-CM | POA: Insufficient documentation

## 2023-04-28 DIAGNOSIS — I82401 Acute embolism and thrombosis of unspecified deep veins of right lower extremity: Secondary | ICD-10-CM

## 2023-04-28 LAB — CBC WITH DIFFERENTIAL/PLATELET
Abs Immature Granulocytes: 0.02 10*3/uL (ref 0.00–0.07)
Basophils Absolute: 0 10*3/uL (ref 0.0–0.1)
Basophils Relative: 1 %
Eosinophils Absolute: 0.2 10*3/uL (ref 0.0–0.5)
Eosinophils Relative: 4 %
HCT: 42.3 % (ref 39.0–52.0)
Hemoglobin: 13.7 g/dL (ref 13.0–17.0)
Immature Granulocytes: 0 %
Lymphocytes Relative: 23 %
Lymphs Abs: 1.3 10*3/uL (ref 0.7–4.0)
MCH: 29.3 pg (ref 26.0–34.0)
MCHC: 32.4 g/dL (ref 30.0–36.0)
MCV: 90.6 fL (ref 80.0–100.0)
Monocytes Absolute: 0.6 10*3/uL (ref 0.1–1.0)
Monocytes Relative: 11 %
Neutro Abs: 3.4 10*3/uL (ref 1.7–7.7)
Neutrophils Relative %: 61 %
Platelets: 312 10*3/uL (ref 150–400)
RBC: 4.67 MIL/uL (ref 4.22–5.81)
RDW: 18.2 % — ABNORMAL HIGH (ref 11.5–15.5)
WBC: 5.6 10*3/uL (ref 4.0–10.5)
nRBC: 0 % (ref 0.0–0.2)

## 2023-04-28 LAB — IRON AND TIBC
Iron: 128 ug/dL (ref 45–182)
Saturation Ratios: 38 % (ref 17.9–39.5)
TIBC: 340 ug/dL (ref 250–450)
UIBC: 212 ug/dL

## 2023-04-28 LAB — BASIC METABOLIC PANEL
Anion gap: 9 (ref 5–15)
BUN: 20 mg/dL (ref 8–23)
CO2: 22 mmol/L (ref 22–32)
Calcium: 9.2 mg/dL (ref 8.9–10.3)
Chloride: 112 mmol/L — ABNORMAL HIGH (ref 98–111)
Creatinine, Ser: 1.13 mg/dL (ref 0.61–1.24)
GFR, Estimated: >60 mL/min (ref >60)
Glucose, Bld: 131 mg/dL — ABNORMAL HIGH (ref 70–99)
Potassium: 3.9 mmol/L (ref 3.5–5.1)
Sodium: 143 mmol/L (ref 135–145)

## 2023-04-28 LAB — FERRITIN: Ferritin: 66 ng/mL (ref 24–336)

## 2023-04-28 LAB — FOLATE: Folate: 23 ng/mL (ref >5.9)

## 2023-04-28 LAB — VITAMIN B12: Vitamin B-12: 1467 pg/mL — ABNORMAL HIGH (ref 180–914)

## 2023-04-28 NOTE — Progress Notes (Signed)
Hematology/Oncology Consult note [facility]  [phone] [phone]  Patient Care Team: System, Provider Not In as PCP - General   Name of the patient: Russell [name]  [id number]  Jun 27, 1951   Date of visit: 04/28/23  Diagnosis- recurrent b/L LE DVT   Chief complaint/ Reason for visit-routine follow-up of history of DVT on Xarelto  Heme/Onc history:1.  patient is a 72 year old Hispanic male who had left lower extremity DVT in May 2023. Ultrasound showed acute nonocclusive DVT extending from the left femoral vein into the left popliteal vein.  No evidence of DVT on the right side.  At that time patient was started on Eliquis.  He only took the starter pack but never continued anticoagulation following that.    2.  He then presented with worsening lower extremity swelling and November 2023 at that time he was found to have extensive acute DVT throughout his right lower extremity from the common femoral vein into the calf CT angio chest did not reveal any evidence of PE.  He underwent catheter directed thrombolysis to the right iliac and common femoral vein and mechanical thrombectomy on 10/27/2022.  He was started on Eliquis at that time.     3. He was subsequently followed by vascular surgery but then again presented to the ER on 11/05/2022 with worsening right lower extremity swelling at that time he was noted to have an occlusive DVT in the right common femoral vein and nonocclusive thrombus in the profundofemoral vein and posterior tibial vein which was not noted on prior ultrasound on 10/25/2022.  He had a CT venogram on 11/05/2022 which showed IVC filter in place with a DVT extending from the IVC filter inferiorly through the lower IVC to the confluence of the iliac veins.  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   Vascular surgery retrieved the IVC filter and patient was discharged on Eliquis in December 2023.   4.Patient was readmitted to the hospital in January 2024 and was found to have bilateral lower extremity DVT despite being on Eliquis.  He underwent recannulization with thrombectomy and angioplasty of the left common iliac vein.  He was subsequently discharged on Xarelto.  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  Testing for antiphospholipid antibody syndrome was negative.  X-ray study was negative anticardiolipin antibody and beta-2 glycoprotein titers were less than 40.   5.  Patient has a supposedly allergy to [place]   Around the same time patient also had vancomycin and therefore patient developed some rash and hives.  It is unclear if the allergy was towards vancomycin or heparin.     Interval history-history obtained with the help of Spanish interpreter.  Tolerating Xarelto well without any significant side effects.  Denies any pruritus.  ECOG PS- 1 Pain scale- 0   Review of systems- Review of Systems  Constitutional:  Negative for chills, fever, malaise/fatigue and weight loss.  HENT:  Negative for congestion, ear discharge and nosebleeds.   Eyes:  Negative for blurred vision.  Respiratory:  Negative for cough, hemoptysis, sputum production, shortness of breath and wheezing.   Cardiovascular:  Negative for chest pain, palpitations, orthopnea and claudication.  Gastrointestinal:  Negative for abdominal pain, blood in stool, constipation, diarrhea, heartburn, melena, nausea and vomiting.  Genitourinary:  Negative for dysuria, flank pain, frequency, hematuria and urgency.  Musculoskeletal:  Negative for back pain, joint pain and myalgias.  Skin:  Negative for rash.  Neurological:  Negative for dizziness, tingling, focal weakness, seizures, weakness and headaches.  Endo/Heme/Allergies:   Does not bruise/bleed easily.  Psychiatric/Behavioral:  Negative for depression and suicidal ideas. The patient does not have insomnia.       Allergies  Allergen Reactions   Penicillins Swelling   Shrimp [Shellfish Allergy] Hives   Cephalosporins Hives and Itching   Cisatracurium Other (See Comments)    Other reaction(s): Unknown Other reaction(s): Unknown    Heparin Hives and Swelling    Patient's daughter reports patient had swelling/hives during hospital admission in [place]. They were on several medications at once so she is unsure whether it was VANCOMYCIN that caused this reaction or another concomitant medication.    Rocuronium Other (See Comments)    Other reaction(s): Unknown Other reaction(s): Unknown    Vancomycin Hives and Swelling    Patient's daughter reports patient had swelling/hives during hospital admission in [place]. They were on several medications at once so she is unsure whether it was VANCOMYCIN that caused this reaction or another concomitant medication.    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   DVT (deep venous thrombosis) (HCC)    pt had surgery to remove clot on right leg   Hyperlipidemia    Hypertension    Skin cancer of scalp    surgery 12/13/2022     Past Surgical History:  Procedure Laterality Date   BACK SURGERY     COLONOSCOPY WITH PROPOFOL N/A 11/05/2019   Procedure: COLONOSCOPY WITH PROPOFOL;  Surgeon: Wyline Mood, MD;  Location: [facility] ENDOSCOPY;  Service: Gastroenterology;  Laterality: N/A;   IVC FILTER REMOVAL N/A 11/09/2022   Procedure: IVC FILTER REMOVAL;  Surgeon: Annice Needy, MD;  Location: [facility] INVASIVE CV LAB;  Service: Cardiovascular;  Laterality: N/A;   MOHS SURGERY  12/13/2022   scalp of head   PARTIAL COLECTOMY N/A 02/11/2020   Procedure: PARTIAL COLECTOMY -- sigmoid;  Surgeon: Henrene Dodge, MD;  Location: [facility] ORS;  Service:  General;  Laterality: N/A;   PERIPHERAL VASCULAR THROMBECTOMY Right 10/07/2022   Procedure: PERIPHERAL VASCULAR THROMBECTOMY;  Surgeon: Annice Needy, MD;  Location: [facility] INVASIVE CV LAB;  Service: Cardiovascular;  Laterality: Right;   PERIPHERAL VASCULAR THROMBECTOMY N/A 10/27/2022   Procedure: PERIPHERAL VASCULAR THROMBECTOMY;  Surgeon: Annice Needy, MD;  Location: [facility] INVASIVE CV LAB;  Service: Cardiovascular;  Laterality: N/A;   PERIPHERAL VASCULAR THROMBECTOMY Right 11/07/2022   Procedure: PERIPHERAL VASCULAR THROMBECTOMY;  Surgeon: Annice Needy, MD;  Location: [facility] INVASIVE CV LAB;  Service: Cardiovascular;  Laterality: Right;   PERIPHERAL VASCULAR THROMBECTOMY N/A 11/09/2022   Procedure: PERIPHERAL VASCULAR THROMBECTOMY;  Surgeon: Annice Needy, MD;  Location: [facility] INVASIVE CV LAB;  Service: Cardiovascular;  Laterality: N/A;  Inferior Vena Cava Thrombectomy   PERIPHERAL VASCULAR THROMBECTOMY Bilateral 11/28/2022   Procedure: PERIPHERAL VASCULAR THROMBECTOMY;  Surgeon: Annice Needy, MD;  Location: [facility] INVASIVE CV LAB;  Service: Cardiovascular;  Laterality: Bilateral;   SPINE SURGERY     TAKE DOWN OF INTESTINAL FISTULA N/A 02/11/2020   Procedure: TAKE DOWN OF INTESTINAL FISTULA -- colovesical fistula;  Surgeon: Henrene Dodge, MD;  Location: [facility] ORS;  Service: General;  Laterality: N/A;  TRACHEAL SURGERY      Social History   Socioeconomic History   Marital status: Married    Spouse name: Not on file   Number of children: Not on file   Years of education: Not on file   Highest education level: Not on file  Occupational History   Not on file  Tobacco Use   Smoking status: Never   Smokeless tobacco: Never  Vaping Use   Vaping Use: Never used  Substance and Sexual Activity   Alcohol use: No   Drug use: No   Sexual activity: Yes    Birth control/protection: None  Other Topics Concern   Not on file  Social History Narrative   Not on file   Social Determinants of Health    Financial Resource Strain: Not on file  Food Insecurity: No Food Insecurity (11/28/2022)   Hunger Vital Sign    Worried About Running Out of Food in the Last Year: Never true    Ran Out of Food in the Last Year: Never true  Transportation Needs: No Transportation Needs (11/28/2022)   PRAPARE - Administrator, Civil Service (Medical): No    Lack of Transportation (Non-Medical): No  Physical Activity: Not on file  Stress: Not on file  Social Connections: Not on file  Intimate Partner Violence: Not At Risk (11/28/2022)   Humiliation, Afraid, Rape, and Kick questionnaire    Fear of Current or Ex-Partner: No    Emotionally Abused: No    Physically Abused: No    Sexually Abused: No    Family History  Problem Relation Age of Onset   Prostate cancer Neg Hx    Bladder Cancer Neg Hx    Kidney cancer Neg Hx      Current Outpatient Medications:    Emollient (AVEENO [employer]IVE [employer]UR[employer] DAILY) LOTN, Apply 1 Application topically in the morning and at bedtime., Disp: 198 g, Rfl: 0   ferrous sulfate 325 (65 FE) MG tablet, Take by mouth., Disp: , Rfl:    lidocaine (XYLOCAINE) 5 % ointment, Apply 1 Application topically 3 (three) times daily as needed for mild pain., Disp: , Rfl:    rivaroxaban (XARELTO) 20 MG TABS tablet, Take 1 tablet (20 mg total) by mouth daily with supper., Disp: 30 tablet, Rfl: 3   gabapentin (NEURONTIN) 300 MG capsule, Take 300 mg by mouth 3 (three) times daily. (Patient not taking: Reported on 03/07/2023), Disp: , Rfl:   Physical exam:  Vitals:   04/28/23 1114  BP: 106/82  Pulse: 69  Resp: 18  Temp: 98.1 F (36.7 C)  TempSrc: Tympanic  SpO2: 94%  Weight: 200 lb (90.7 kg)  Height: 5\' 10"  (1.778 m)   Physical Exam Cardiovascular:     Rate and Rhythm: Normal rate and regular rhythm.     Heart sounds: Normal heart sounds.  Pulmonary:     Effort: Pulmonary effort is normal.     Breath sounds: Normal breath sounds.  Abdominal:     General: Bowel sounds are  normal.     Palpations: Abdomen is soft.  Musculoskeletal:     Right lower leg: No edema.     Left lower leg: No edema.  Skin:    General: Skin is warm and dry.  Neurological:     Mental Status: He is alert and oriented to person, place, and time.         Latest Ref Rng & Units 04/28/2023   11:03 AM  CMP  Glucose 70 -  99 mg/dL 409   BUN 8 - 23 mg/dL 20   Creatinine 8.11 - 1.24 mg/dL 9.14   Sodium 782 - 956 mmol/L 143   Potassium 3.5 - 5.1 mmol/L 3.9   Chloride 98 - 111 mmol/L 112   CO2 22 - 32 mmol/L 22   Calcium 8.9 - 10.3 mg/dL 9.2       Latest Ref Rng & Units 04/28/2023   11:03 AM  CBC  WBC 4.0 - 10.5 K/uL 5.6   Hemoglobin 13.0 - 17.0 g/dL 21.3   Hematocrit 08.6 - 52.0 % 42.3   Platelets 150 - 400 K/uL 312      Assessment and plan- Patient is a 72 y.o. male recurrent DVT on Xarelto here for routine follow-up.    Patient has not had any recurrent thrombotic events on Xarelto and he is tolerating it well.  His hemoglobin has improved from 8.4-13.7.  Ferritin levels are normal at 66 with an iron saturation of 38%.  B12 levels are normal as well  I will see him back in 6 months with labs   Visit Diagnosis 1. Current use of long term anticoagulation   2. History of recurrent deep vein thrombosis (DVT)      Dr. Owens Shark, MD, MPH [facility] at [facility] [phone] 04/28/2023 1:25 PM

## 2023-04-28 NOTE — Progress Notes (Signed)
Unable to reach pt by phone. My chart message sent.

## 2023-06-01 ENCOUNTER — Ambulatory Visit: Payer: Medicare Other | Admitting: Student in an Organized Health Care Education/Training Program

## 2023-06-02 ENCOUNTER — Encounter: Payer: Self-pay | Admitting: Student in an Organized Health Care Education/Training Program

## 2023-09-18 ENCOUNTER — Ambulatory Visit: Payer: 59 | Admitting: Urology

## 2023-09-18 ENCOUNTER — Encounter: Payer: Self-pay | Admitting: Urology

## 2023-09-18 VITALS — BP 132/80 | HR 114 | Ht 70.0 in | Wt 200.0 lb

## 2023-09-18 DIAGNOSIS — N401 Enlarged prostate with lower urinary tract symptoms: Secondary | ICD-10-CM | POA: Diagnosis not present

## 2023-09-18 DIAGNOSIS — R350 Frequency of micturition: Secondary | ICD-10-CM | POA: Diagnosis not present

## 2023-09-18 DIAGNOSIS — R3912 Poor urinary stream: Secondary | ICD-10-CM

## 2023-09-18 LAB — URINALYSIS, COMPLETE
Bilirubin, UA: NEGATIVE
Glucose, UA: NEGATIVE
Ketones, UA: NEGATIVE
Leukocytes,UA: NEGATIVE
Nitrite, UA: NEGATIVE
Protein,UA: NEGATIVE
Specific Gravity, UA: 1.01 (ref 1.005–1.030)
Urobilinogen, Ur: 0.2 mg/dL (ref 0.2–1.0)
pH, UA: 6 (ref 5.0–7.5)

## 2023-09-18 LAB — MICROSCOPIC EXAMINATION

## 2023-09-18 MED ORDER — TAMSULOSIN HCL 0.4 MG PO CAPS: 0.4000 mg | ORAL_CAPSULE | Freq: Every day | ORAL | 3 refills | Status: AC

## 2023-09-18 NOTE — Progress Notes (Unsigned)
I, Russell [name], acting as a scribe for Russell Altes, MD., have documented all relevant documentation on the behalf of Russell Altes, MD, as directed by  Russell Altes, MD while in the presence of Russell Altes, MD.  09/18/2023 9:35 AM   Russell [name] 12-19-1950 [id number]  Chief Complaint  Patient presents with   Hematuria    HPI: Russell [name] is a 72 y.o. male referred for evaluation of lower urinary tract symptoms.   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 No recurrent infections since that time.  Presents today with a several week history of bothersome lower urinary tract symptoms including urinary frequency, urgency, weak urinary stream, straining to urinate, and nocturia x4.  IPSS 27/35 with bother rated 6/6.  Denies dysuria, gross hematuria.  No flank, abdominal, or pelvic pain. Prior cysto 2020 with moderate lateral lobe enlargement.    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   DVT (deep venous thrombosis) (HCC)    pt had surgery to remove clot on right leg   Hyperlipidemia    Hypertension    Skin cancer of scalp    surgery 12/13/2022    Surgical History: Past Surgical History:  Procedure Laterality Date   BACK SURGERY     COLONOSCOPY WITH PROPOFOL N/A 11/05/2019   Procedure: COLONOSCOPY WITH PROPOFOL;  Surgeon: Wyline Mood, MD;  Location: [facility] ENDOSCOPY;  Service: Gastroenterology;  Laterality: N/A;   IVC FILTER REMOVAL N/A 11/09/2022   Procedure: IVC FILTER REMOVAL;  Surgeon: Annice Needy, MD;  Location: [facility] INVASIVE CV LAB;  Service: Cardiovascular;  Laterality: N/A;   MOHS SURGERY  12/13/2022   scalp of head   PARTIAL COLECTOMY N/A 02/11/2020   Procedure: PARTIAL COLECTOMY -- sigmoid;  Surgeon: Henrene Dodge, MD;  Location: [facility] ORS;  Service: General;  Laterality: N/A;    PERIPHERAL VASCULAR THROMBECTOMY Right 10/07/2022   Procedure: PERIPHERAL VASCULAR THROMBECTOMY;  Surgeon: Annice Needy, MD;  Location: [facility] INVASIVE CV LAB;  Service: Cardiovascular;  Laterality: Right;   PERIPHERAL VASCULAR THROMBECTOMY N/A 10/27/2022   Procedure: PERIPHERAL VASCULAR THROMBECTOMY;  Surgeon: Annice Needy, MD;  Location: [facility] INVASIVE CV LAB;  Service: Cardiovascular;  Laterality: N/A;   PERIPHERAL VASCULAR THROMBECTOMY Right 11/07/2022   Procedure: PERIPHERAL VASCULAR THROMBECTOMY;  Surgeon: Annice Needy, MD;  Location: [facility] INVASIVE CV LAB;  Service: Cardiovascular;  Laterality: Right;   PERIPHERAL VASCULAR THROMBECTOMY N/A 11/09/2022   Procedure: PERIPHERAL VASCULAR THROMBECTOMY;  Surgeon: Annice Needy, MD;  Location: [facility] INVASIVE CV LAB;  Service: Cardiovascular;  Laterality: N/A;  Inferior Vena Cava Thrombectomy   PERIPHERAL VASCULAR THROMBECTOMY Bilateral 11/28/2022   Procedure: PERIPHERAL VASCULAR THROMBECTOMY;  Surgeon: Annice Needy, MD;  Location: [facility] INVASIVE CV LAB;  Service: Cardiovascular;  Laterality: Bilateral;   SPINE SURGERY     TAKE DOWN OF INTESTINAL FISTULA N/A 02/11/2020   Procedure: TAKE DOWN OF INTESTINAL FISTULA -- colovesical fistula;  Surgeon: Henrene Dodge, MD;  Location: [facility] ORS;  Service: General;  Laterality: N/A;   TRACHEAL SURGERY      Home Medications:  Allergies as of 09/18/2023       Reactions   Penicillins Swelling   Shrimp [shellfish Allergy] Hives   Cephalosporins Hives, Itching   Cisatracurium Other (See Comments)   Other reaction(s): Unknown Other reaction(s): Unknown  Heparin Hives, Swelling   Patient's daughter reports patient had swelling/hives during hospital admission in [place]. They were on several medications at once so she is unsure whether it was VANCOMYCIN that caused this reaction or another concomitant medication.    Rocuronium Other (See Comments)   Other reaction(s): Unknown Other reaction(s): Unknown   Vancomycin  Hives, Swelling   Patient's daughter reports patient had swelling/hives during hospital admission in [place]. They were on several medications at once so she is unsure whether it was VANCOMYCIN that caused this reaction or another concomitant medication.    Wound Dressings Rash   Tegaderm        Medication List        Accurate as of September 18, 2023 11:59 PM. If you have any questions, ask your nurse or doctor.          Aveeno Active Naturals Daily Lotn Apply 1 Application topically in the morning and at bedtime.   DULoxetine 30 MG capsule Commonly known as: CYMBALTA Take by mouth.   ferrous sulfate 325 (65 FE) MG tablet Take by mouth.   gabapentin 300 MG capsule Commonly known as: NEURONTIN Take 300 mg by mouth 3 (three) times daily.   lidocaine 5 % ointment Commonly known as: XYLOCAINE Apply 1 Application topically 3 (three) times daily as needed for mild pain.   rivaroxaban 20 MG Tabs tablet Commonly known as: XARELTO Take 1 tablet (20 mg total) by mouth daily with supper.   tamsulosin 0.4 MG Caps capsule Commonly known as: FLOMAX Take 1 capsule (0.4 mg total) by mouth daily. Started by: Russell [name]        Allergies:  Allergies  Allergen Reactions   Penicillins Swelling   Shrimp [Shellfish Allergy] Hives   Cephalosporins Hives and Itching   Cisatracurium Other (See Comments)    Other reaction(s): Unknown Other reaction(s): Unknown    Heparin Hives and Swelling    Patient's daughter reports patient had swelling/hives during hospital admission in [place]. They were on several medications at once so she is unsure whether it was VANCOMYCIN that caused this reaction or another concomitant medication.    Rocuronium Other (See Comments)    Other reaction(s): Unknown Other reaction(s): Unknown    Vancomycin Hives and Swelling    Patient's daughter reports patient had swelling/hives during hospital admission in [place]. They were on several medications at once  so she is unsure whether it was VANCOMYCIN that caused this reaction or another concomitant medication.    Wound Dressings Rash    Tegaderm     Family History: Family History  Problem Relation Age of Onset   Prostate cancer Neg Hx    Bladder Cancer Neg Hx    Kidney cancer Neg Hx     Social History:  reports that he has never smoked. He has never used smokeless tobacco. He reports that he does not drink alcohol and does not use drugs.   Physical Exam: BP 132/80   Pulse (!) 114   Ht 5\' 10"  (1.778 m)   Wt 200 lb (90.7 kg)   BMI 28.70 kg/m   Constitutional:  Alert, No acute distress. HEENT: [place] AT Respiratory: Normal respiratory effort, no increased work of breathing. GU: Prostate 50 grams, smooth without nodules or induration.  Psychiatric: Normal mood and affect.  Urinalysis Dipstick trace blood/microscopy negative.   Assessment & Plan:    1. BPH with LUTS Severe LUTS by IPSS.  PVR today 6 mL.  Symptoms are bothersome enough that  he desires a trial of medical management and Rx tamsulosin was sent to pharmacy  A [place] Spanish interpreter was present during this visit PA follow-up 1 month for symptom recheck   I have reviewed the above documentation for accuracy and completeness, and I agree with the above.   Russell Altes, MD  [facility] [address] [place], [place] [address] [phone]

## 2023-10-05 ENCOUNTER — Ambulatory Visit: Payer: 59

## 2023-10-18 ENCOUNTER — Ambulatory Visit: Payer: 59 | Admitting: Student in an Organized Health Care Education/Training Program

## 2023-10-27 ENCOUNTER — Other Ambulatory Visit: Payer: Self-pay

## 2023-10-27 DIAGNOSIS — Z86718 Personal history of other venous thrombosis and embolism: Secondary | ICD-10-CM

## 2023-10-30 ENCOUNTER — Encounter: Payer: Self-pay | Admitting: Oncology

## 2023-10-30 ENCOUNTER — Inpatient Hospital Stay: Payer: 59 | Attending: Oncology

## 2023-10-30 ENCOUNTER — Inpatient Hospital Stay: Payer: 59 | Admitting: Oncology

## 2023-10-30 DIAGNOSIS — R790 Abnormal level of blood mineral: Secondary | ICD-10-CM | POA: Insufficient documentation

## 2023-10-30 DIAGNOSIS — Z86711 Personal history of pulmonary embolism: Secondary | ICD-10-CM | POA: Insufficient documentation

## 2023-10-30 DIAGNOSIS — Z79899 Other long term (current) drug therapy: Secondary | ICD-10-CM | POA: Insufficient documentation

## 2023-10-30 DIAGNOSIS — Z7901 Long term (current) use of anticoagulants: Secondary | ICD-10-CM | POA: Insufficient documentation

## 2023-10-30 DIAGNOSIS — Z86718 Personal history of other venous thrombosis and embolism: Secondary | ICD-10-CM | POA: Insufficient documentation

## 2023-10-31 ENCOUNTER — Inpatient Hospital Stay: Payer: 59

## 2023-10-31 ENCOUNTER — Inpatient Hospital Stay (HOSPITAL_BASED_OUTPATIENT_CLINIC_OR_DEPARTMENT_OTHER): Payer: 59 | Admitting: Oncology

## 2023-10-31 ENCOUNTER — Encounter: Payer: Self-pay | Admitting: Oncology

## 2023-10-31 VITALS — BP 125/85 | HR 103 | Temp 98.6°F | Resp 20 | Wt 211.0 lb

## 2023-10-31 DIAGNOSIS — Z79899 Other long term (current) drug therapy: Secondary | ICD-10-CM | POA: Diagnosis not present

## 2023-10-31 DIAGNOSIS — Z7901 Long term (current) use of anticoagulants: Secondary | ICD-10-CM

## 2023-10-31 DIAGNOSIS — Z86718 Personal history of other venous thrombosis and embolism: Secondary | ICD-10-CM

## 2023-10-31 DIAGNOSIS — Z86711 Personal history of pulmonary embolism: Secondary | ICD-10-CM | POA: Diagnosis present

## 2023-10-31 DIAGNOSIS — R790 Abnormal level of blood mineral: Secondary | ICD-10-CM | POA: Diagnosis not present

## 2023-10-31 LAB — CBC WITH DIFFERENTIAL/PLATELET
Abs Immature Granulocytes: 0.03 10*3/uL (ref 0.00–0.07)
Basophils Absolute: 0.1 10*3/uL (ref 0.0–0.1)
Basophils Relative: 1 %
Eosinophils Absolute: 0.3 10*3/uL (ref 0.0–0.5)
Eosinophils Relative: 5 %
HCT: 43.9 % (ref 39.0–52.0)
Hemoglobin: 14.6 g/dL (ref 13.0–17.0)
Immature Granulocytes: 1 %
Lymphocytes Relative: 24 %
Lymphs Abs: 1.6 10*3/uL (ref 0.7–4.0)
MCH: 30.8 pg (ref 26.0–34.0)
MCHC: 33.3 g/dL (ref 30.0–36.0)
MCV: 92.6 fL (ref 80.0–100.0)
Monocytes Absolute: 0.6 10*3/uL (ref 0.1–1.0)
Monocytes Relative: 9 %
Neutro Abs: 3.9 10*3/uL (ref 1.7–7.7)
Neutrophils Relative %: 60 %
Platelets: 188 10*3/uL (ref 150–400)
RBC: 4.74 MIL/uL (ref 4.22–5.81)
RDW: 13.9 % (ref 11.5–15.5)
WBC: 6.5 10*3/uL (ref 4.0–10.5)
nRBC: 0 % (ref 0.0–0.2)

## 2023-10-31 LAB — BASIC METABOLIC PANEL - CANCER CENTER ONLY
Anion gap: 10 (ref 5–15)
BUN: 26 mg/dL — ABNORMAL HIGH (ref 8–23)
CO2: 20 mmol/L — ABNORMAL LOW (ref 22–32)
Calcium: 9.4 mg/dL (ref 8.9–10.3)
Chloride: 112 mmol/L — ABNORMAL HIGH (ref 98–111)
Creatinine: 1.09 mg/dL (ref 0.61–1.24)
GFR, Estimated: >60 mL/min (ref >60)
Glucose, Bld: 116 mg/dL — ABNORMAL HIGH (ref 70–99)
Potassium: 4.1 mmol/L (ref 3.5–5.1)
Sodium: 142 mmol/L (ref 135–145)

## 2023-10-31 LAB — IRON AND TIBC
Iron: 135 ug/dL (ref 45–182)
Saturation Ratios: 36 % (ref 17.9–39.5)
TIBC: 377 ug/dL (ref 250–450)
UIBC: 242 ug/dL

## 2023-10-31 LAB — FERRITIN: Ferritin: 19 ng/mL — ABNORMAL LOW (ref 24–336)

## 2023-10-31 LAB — VITAMIN B12: Vitamin B-12: 248 pg/mL (ref 180–914)

## 2023-10-31 LAB — FOLATE: Folate: 23 ng/mL (ref >5.9)

## 2023-10-31 NOTE — Progress Notes (Signed)
Hematology/Oncology Consult note [facility]  [phone] [phone]  Patient Care Team: System, Provider Not In as PCP - General Creig Hines, MD as Consulting Physician (Oncology)   Name of the patient: Russell [name]  [id number]  06-17-1951   Date of visit: 10/31/23  Diagnosis-history of recurrent DVT  Chief complaint/ Reason for visit-routine follow-up of recurrent DVT currently on Xarelto  Heme/Onc history: 1.  patient is a 72 year old Hispanic male who had left lower extremity DVT in May 2023. Ultrasound showed acute nonocclusive DVT extending from the left femoral vein into the left popliteal vein.  No evidence of DVT on the right side.  At that time patient was started on Eliquis.  He only took the starter pack but never continued anticoagulation following that.    2.  He then presented with worsening lower extremity swelling and November 2023 at that time he was found to have extensive acute DVT throughout his right lower extremity from the common femoral vein into the calf CT angio chest did not reveal any evidence of PE.  He underwent catheter directed thrombolysis to the right iliac and common femoral vein and mechanical thrombectomy on 10/27/2022.  He was started on Eliquis at that time.     3. He was subsequently followed by vascular surgery but then again presented to the ER on 11/05/2022 with worsening right lower extremity swelling at that time he was noted to have an occlusive DVT in the right common femoral vein and nonocclusive thrombus in the profundofemoral vein and posterior tibial vein which was not noted on prior ultrasound on 10/25/2022.  He had a CT venogram on 11/05/2022 which showed IVC filter in place with a DVT extending from the IVC filter inferiorly through the lower IVC to the confluence of the iliac veins.  It is unclear as to when this IVC filter was placed in the past and the cause of his recurrent lower  extremity DVT is thought to be nidus of clot surrounding his IVC.  Vascular surgery retrieved the IVC filter and patient was discharged on Eliquis in December 2023.   4.Patient was readmitted to the hospital in January 2024 and was found to have bilateral lower extremity DVT despite being on Eliquis.  He underwent recannulization with thrombectomy and angioplasty of the left common iliac vein.  He was subsequently discharged on Xarelto.  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  Testing for antiphospholipid antibody syndrome was negative.  Hex phase was negative.  Anticardiolipin antibody and beta-2 glycoprotein titers were less than 40.   5.  Patient has a supposedly allergy to [place]   Around the same time patient also had vancomycin and therefore patient developed some rash and hives.  It is unclear if the allergy was towards vancomycin or heparin.     Interval history-history obtained with the help of Spanish interpreter.  He is currently doing well on Xarelto and has not had any recurrent episodes of DVT or PE.  He plans to go to [place] for 1 year starting January 2025  ECOG PS- 1 Pain scale- 0   Review of systems- Review of Systems  Constitutional:  Negative for chills, fever, malaise/fatigue and weight loss.  HENT:  Negative for congestion, ear discharge and nosebleeds.   Eyes:  Negative for blurred vision.  Respiratory:  Negative for cough, hemoptysis, sputum production, shortness of breath and wheezing.  Cardiovascular:  Negative for chest pain, palpitations, orthopnea and claudication.  Gastrointestinal:  Negative for abdominal pain, blood in stool, constipation, diarrhea, heartburn, melena, nausea and vomiting.  Genitourinary:  Negative for dysuria, flank pain, frequency, hematuria and urgency.  Musculoskeletal:  Negative for back pain, joint pain and myalgias.  Skin:  Negative  for rash.  Neurological:  Negative for dizziness, tingling, focal weakness, seizures, weakness and headaches.  Endo/Heme/Allergies:  Does not bruise/bleed easily.  Psychiatric/Behavioral:  Negative for depression and suicidal ideas. The patient does not have insomnia.       Allergies  Allergen Reactions   Penicillins Swelling   Shrimp [Shellfish Allergy] Hives   Cephalosporins Hives and Itching   Cisatracurium Other (See Comments)    Other reaction(s): Unknown Other reaction(s): Unknown    Heparin Hives and Swelling    Patient's daughter reports patient had swelling/hives during hospital admission in [place]. They were on several medications at once so she is unsure whether it was VANCOMYCIN that caused this reaction or another concomitant medication.    Rocuronium Other (See Comments)    Other reaction(s): Unknown Other reaction(s): Unknown    Vancomycin Hives and Swelling    Patient's daughter reports patient had swelling/hives during hospital admission in [place]. They were on several medications at once so she is unsure whether it was VANCOMYCIN that caused this reaction or another concomitant medication.    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   DVT (deep venous thrombosis) (HCC)    pt had surgery to remove clot on right leg   Hyperlipidemia    Hypertension    Skin cancer of scalp    surgery 12/13/2022     Past Surgical History:  Procedure Laterality Date   BACK SURGERY     COLONOSCOPY WITH PROPOFOL N/A 11/05/2019   Procedure: COLONOSCOPY WITH PROPOFOL;  Surgeon: Wyline Mood, MD;  Location: [facility] ENDOSCOPY;  Service: Gastroenterology;  Laterality: N/A;   IVC FILTER REMOVAL N/A 11/09/2022   Procedure: IVC FILTER REMOVAL;  Surgeon: Annice Needy, MD;  Location: [facility] INVASIVE CV LAB;  Service: Cardiovascular;  Laterality: N/A;   MOHS SURGERY  12/13/2022   scalp of head    PARTIAL COLECTOMY N/A 02/11/2020   Procedure: PARTIAL COLECTOMY -- sigmoid;  Surgeon: Henrene Dodge, MD;  Location: [facility] ORS;  Service: General;  Laterality: N/A;   PERIPHERAL VASCULAR THROMBECTOMY Right 10/07/2022   Procedure: PERIPHERAL VASCULAR THROMBECTOMY;  Surgeon: Annice Needy, MD;  Location: [facility] INVASIVE CV LAB;  Service: Cardiovascular;  Laterality: Right;   PERIPHERAL VASCULAR THROMBECTOMY N/A 10/27/2022   Procedure: PERIPHERAL VASCULAR THROMBECTOMY;  Surgeon: Annice Needy, MD;  Location: [facility] INVASIVE CV LAB;  Service: Cardiovascular;  Laterality: N/A;   PERIPHERAL VASCULAR THROMBECTOMY Right 11/07/2022   Procedure: PERIPHERAL VASCULAR THROMBECTOMY;  Surgeon: Annice Needy, MD;  Location: [facility] INVASIVE CV LAB;  Service: Cardiovascular;  Laterality: Right;   PERIPHERAL VASCULAR THROMBECTOMY N/A 11/09/2022   Procedure: PERIPHERAL VASCULAR THROMBECTOMY;  Surgeon: Annice Needy, MD;  Location: [facility] INVASIVE CV LAB;  Service: Cardiovascular;  Laterality: N/A;  Inferior Vena Cava Thrombectomy   PERIPHERAL VASCULAR THROMBECTOMY Bilateral 11/28/2022   Procedure: PERIPHERAL VASCULAR THROMBECTOMY;  Surgeon: Annice Needy, MD;  Location: [facility] INVASIVE CV LAB;  Service: Cardiovascular;  Laterality: Bilateral;   SPINE SURGERY     TAKE DOWN OF INTESTINAL FISTULA N/A  02/11/2020   Procedure: TAKE DOWN OF INTESTINAL FISTULA -- colovesical fistula;  Surgeon: Henrene Dodge, MD;  Location: [facility] ORS;  Service: General;  Laterality: N/A;   TRACHEAL SURGERY      Social History   Socioeconomic History   Marital status: Married    Spouse name: Not on file   Number of children: Not on file   Years of education: Not on file   Highest education level: Not on file  Occupational History   Not on file  Tobacco Use   Smoking status: Never   Smokeless tobacco: Never  Vaping Use   Vaping status: Never Used  Substance and Sexual Activity   Alcohol use: No   Drug use: No   Sexual activity: Yes    Birth  control/protection: None  Other Topics Concern   Not on file  Social History Narrative   Not on file   Social Determinants of Health   Financial Resource Strain: Low Risk  (05/15/2022)   Received from [facility], [facility]   Overall Financial Resource Strain (CARDIA)    Difficulty of Paying Living Expenses: Not hard at all  Food Insecurity: No Food Insecurity (11/28/2022)   Hunger Vital Sign    Worried About Running Out of Food in the Last Year: Never true    Ran Out of Food in the Last Year: Never true  Transportation Needs: No Transportation Needs (11/28/2022)   PRAPARE - Administrator, Civil Service (Medical): No    Lack of Transportation (Non-Medical): No  Physical Activity: Insufficiently Active (06/07/2018)   Received from [facility], [facility]   Exercise Vital Sign    Days of Exercise per Week: 3 days    Minutes of Exercise per Session: 30 min  Stress: Not on file  Social Connections: Not on file  Intimate Partner Violence: Not At Risk (11/28/2022)   Humiliation, Afraid, Rape, and Kick questionnaire    Fear of Current or Ex-Partner: No    Emotionally Abused: No    Physically Abused: No    Sexually Abused: No    Family History  Problem Relation Age of Onset   Prostate cancer Neg Hx    Bladder Cancer Neg Hx    Kidney cancer Neg Hx      Current Outpatient Medications:    doxycycline (VIBRAMYCIN) 100 MG capsule, Take 100 mg by mouth daily., Disp: , Rfl:    ferrous sulfate 325 (65 FE) MG tablet, Take by mouth., Disp: , Rfl:    lidocaine (XYLOCAINE) 5 % ointment, Apply 1 Application topically 3 (three) times daily as needed for mild pain., Disp: , Rfl:    DULoxetine (CYMBALTA) 30 MG capsule, Take by mouth. (Patient not taking: Reported on 10/31/2023), Disp: , Rfl:    Emollient (AVEENO ACTIVE NATURALS DAILY) LOTN, Apply 1 Application topically in the morning and at bedtime. (Patient not  taking: Reported on 10/31/2023), Disp: 198 g, Rfl: 0   gabapentin (NEURONTIN) 300 MG capsule, Take 300 mg by mouth 3 (three) times daily. (Patient not taking: Reported on 10/31/2023), Disp: , Rfl:    rivaroxaban (XARELTO) 20 MG TABS tablet, Take 1 tablet (20 mg total) by mouth daily with supper. (Patient not taking: Reported on 10/31/2023), Disp: 30 tablet, Rfl: 3  Physical exam:  Vitals:   10/31/23 1400  BP: 125/85  Pulse: (!) 103  Resp: 20  Temp: 98.6 F (37 C)  SpO2: 100%  Weight: 211 lb (  95.7 kg)   Physical Exam Cardiovascular:     Rate and Rhythm: Normal rate and regular rhythm.     Heart sounds: Normal heart sounds.  Pulmonary:     Effort: Pulmonary effort is normal.     Breath sounds: Normal breath sounds.  Musculoskeletal:     Right lower leg: No edema.     Left lower leg: No edema.  Skin:    General: Skin is warm and dry.  Neurological:     Mental Status: He is alert and oriented to person, place, and time.         Latest Ref Rng & Units 10/31/2023    1:32 PM  CMP  Glucose 70 - 99 mg/dL 409   BUN 8 - 23 mg/dL 26   Creatinine 8.11 - 1.24 mg/dL 9.14   Sodium 782 - 956 mmol/L 142   Potassium 3.5 - 5.1 mmol/L 4.1   Chloride 98 - 111 mmol/L 112   CO2 22 - 32 mmol/L 20   Calcium 8.9 - 10.3 mg/dL 9.4       Latest Ref Rng & Units 10/31/2023    1:32 PM  CBC  WBC 4.0 - 10.5 K/uL 6.5   Hemoglobin 13.0 - 17.0 g/dL 21.3   Hematocrit 08.6 - 52.0 % 43.9   Platelets 150 - 400 K/uL 188      Assessment and plan- Patient is a 72 y.o. male with history of recurrent DVT and PE here for routine follow-up  Patient has had recurrent episodes of DVT and PE between May 2023 to January 2024 as highlighted above and therefore he will remain on Xarelto indefinitely unless there are any concerns for bleeding issues which the patient presently does not have.  Patient would like to go to [place] for 1 year and we can give him 3 months of Xarelto prescription at a time but subsequent  prescriptions will need to be filled out by a medical provider in [place].  He can also get in touch with his pharmacy and see how much Xarelto he can carry with him to [place].  I will tentatively see him back in 1 year no labs   Visit Diagnosis 1. History of recurrent deep vein thrombosis (DVT)   2. Current use of long term anticoagulation      Dr. Owens Shark, MD, MPH [facility] [phone] 10/31/2023 3:13 PM

## 2023-11-09 ENCOUNTER — Ambulatory Visit
Admission: RE | Admit: 2023-11-09 | Discharge: 2023-11-09 | Disposition: A | Discharge status: Home / Self Care | Payer: 59 | Source: Ambulatory Visit | Attending: Oncology | Admitting: Oncology

## 2023-11-09 ENCOUNTER — Ambulatory Visit: Payer: 59

## 2023-11-09 DIAGNOSIS — J849 Interstitial pulmonary disease, unspecified: Secondary | ICD-10-CM | POA: Insufficient documentation

## 2023-11-09 LAB — PULMONARY FUNCTION TEST ARMC ONLY
DL/VA % pred: 75 %
DL/VA: 3.02 ml/min/mmHg/L
DLCO unc % pred: 63 %
DLCO unc: 16.72 ml/min/mmHg
FEF 25-75 Post: 2.36 L/s
FEF 25-75 Pre: 1.76 L/s
FEF2575-%Change-Post: 33 %
FEF2575-%Pred-Post: 97 %
FEF2575-%Pred-Pre: 72 %
FEV1-%Change-Post: 4 %
FEV1-%Pred-Post: 79 %
FEV1-%Pred-Pre: 76 %
FEV1-Post: 2.62 L
FEV1-Pre: 2.49 L
FEV1FVC-%Change-Post: 6 %
FEV1FVC-%Pred-Pre: 104 %
FEV6-%Change-Post: -1 %
FEV6-%Pred-Post: 76 %
FEV6-%Pred-Pre: 77 %
FEV6-Post: 3.23 L
FEV6-Pre: 3.27 L
FEV6FVC-%Pred-Post: 106 %
FEV6FVC-%Pred-Pre: 106 %
FVC-%Change-Post: -1 %
FVC-%Pred-Post: 72 %
FVC-%Pred-Pre: 72 %
FVC-Post: 3.23 L
FVC-Pre: 3.27 L
Post FEV1/FVC ratio: 81 %
Post FEV6/FVC ratio: 100 %
Pre FEV1/FVC ratio: 76 %
Pre FEV6/FVC Ratio: 100 %

## 2023-11-09 MED ORDER — ALBUTEROL SULFATE (2.5 MG/3ML) 0.083% IN NEBU
2.5000 mg | INHALATION_SOLUTION | Freq: Once | RESPIRATORY_TRACT | Status: AC
  Administered 2023-11-09: 2.5 mg via RESPIRATORY_TRACT
  Filled 2023-11-09: qty 3

## 2023-12-06 ENCOUNTER — Ambulatory Visit: Payer: 59 | Admitting: Student in an Organized Health Care Education/Training Program

## 2023-12-06 ENCOUNTER — Encounter: Payer: Self-pay | Admitting: Student in an Organized Health Care Education/Training Program

## 2023-12-06 VITALS — BP 126/80 | HR 85 | Temp 97.6°F | Ht 70.0 in | Wt 216.0 lb

## 2023-12-06 DIAGNOSIS — J849 Interstitial pulmonary disease, unspecified: Secondary | ICD-10-CM | POA: Diagnosis not present

## 2023-12-06 NOTE — Progress Notes (Signed)
 Assessment & Plan:   1. ILD (interstitial lung disease) (HCC) (Primary)  Presents for follow up of ILD, noted on previous chest CT. He's had a repeat HRCT last month (10/2023) which appears to be stable and unchanged compared to prior. On my review, there is ground glass as well as subpleural reticulation on the CT, with possible very early honecomb development (though this is uncertain). There is no change compared to prior HRCT from 01/2023. Differential for this includes fibrotic NSIP, fibrotic HSP, post infectious fibrosis (history of ARDS secondary to influenza) and pneumoconiosis. Auto-immune workup previously with mildly positive ANA (1:320). Rest of the workup was negative (myositis, hypersensitivity, ANCA). He's had his PFT's which are showing a moderate reduction of DLCO at 63% predicted (no prior for comparison).  Discussed with Mr. Guimond recommendations from ILD board of watchful waiting vs initiation of anti-fibrotic agent. He would prefer to continue with watchful waiting. Discussed that this would entail initial PFT's every 6 months and consideration for treatment should there be a decrease in addition to repeat the CT in 1 year.  Given patient preference and overall stability (symptoms & radiographic appearance) will continue with watchful waiting.  - Pulmonary Function Test [facility] Only; Future   Return in about 6 months (around 06/04/2024).  I spent 30 minutes caring for this patient today, including preparing to see the patient, obtaining a medical history , reviewing a separately obtained history, performing a medically appropriate examination and/or evaluation, counseling and educating the patient/family/caregiver, ordering medications, tests, or procedures, documenting clinical information in the electronic health record, and independently interpreting results (not separately reported/billed) and communicating results to the patient/family/caregiver  [name], MD [place]  [facility] 12/06/2023 5:36 PM    End of visit medications:  No orders of the defined types were placed in this encounter.    Current Outpatient Medications:    doxycycline  (VIBRAMYCIN ) 100 MG capsule, Take 100 mg by mouth daily., Disp: , Rfl:    DULoxetine (CYMBALTA) 30 MG capsule, Take by mouth., Disp: , Rfl:    Emollient (AVEENO ACTIVE NATURALS DAILY) LOTN, Apply 1 Application topically in the morning and at bedtime., Disp: 198 g, Rfl: 0   ferrous sulfate  325 (65 FE) MG tablet, Take by mouth., Disp: , Rfl:    gabapentin  (NEURONTIN ) 300 MG capsule, Take 300 mg by mouth 3 (three) times daily., Disp: , Rfl:    lidocaine  (XYLOCAINE ) 5 % ointment, Apply 1 Application topically 3 (three) times daily as needed for mild pain., Disp: , Rfl:    rivaroxaban  (XARELTO ) 20 MG TABS tablet, Take 1 tablet (20 mg total) by mouth daily with supper., Disp: 30 tablet, Rfl: 3   Subjective:   PATIENT ID: Russell [name] GENDER: male DOB: [birth year], [name]: [id number]  Chief Complaint  Patient presents with   Follow-up    Cough.     HPI  Russell [name] is a pleasant 73 year old male with a past medical history of ILD who presents for follow up. He was interviewed with live spanish interpretor Belva Boyden).  Symptoms remains stable, and he denies shortness of breath or cough at the moment. He feels he is in his usual state of health. He has gained some weight since our last visit. He does report a rash over the lower extremities that was biopsied at [facility] dermatology. ILD questionnaire filled during today's visit, no other exposures besides his history of working in construction/HVAC maintenance.   He feels that he is overall healthy. He does  not have any joint pains or swelling, and denies any rashes. 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   Review of the medical record, however, is notable for multiple visits  to pulmonology. The patient was previously seen by [facility] with Dr. Aleskerov. He was last seen there in July of 2022. The patient was seen by pulmonologists at [facility] dating back to 2017. Review of the record notes a diagnosis of pulmonary fibrosis, and it was attributed to a previous bout with ARDS secondary to influenza.   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 He moved to [place] in his 20's to play football, before immig[place] to the [place] . He first lived in [place] , where he built a career in Artist, fabrication, Risk analyst. 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 He is a lifelong non-smoker and enjoys Salsa dancing.  Ancillary information including prior medications, full medical/surgical/family/social histories, and PFTs (when available) are listed below and have been reviewed.   Review of Systems  Constitutional:  Negative for chills, fever, malaise/fatigue and weight loss.  Respiratory:  Negative for cough, hemoptysis, sputum production, shortness of breath and wheezing.   Cardiovascular:  Negative for chest pain.     Objective:   Vitals:   12/06/23 1510  BP: 126/80  Pulse: 85  Temp: 97.6 F (36.4 C)  TempSrc: Temporal  SpO2: 94%  Weight: 216 lb (98 kg)  Height: 5\' 10"  (1.778 m)   94% on RA  BMI Readings from Last 3 Encounters:  12/06/23 30.99 kg/m  10/31/23 30.28 kg/m  09/18/23 28.70 kg/m   Wt Readings from Last 3 Encounters:  12/06/23 216 lb (98 kg)  10/31/23 211 lb (95.7 kg)  09/18/23 200 lb (90.7 kg)    Physical Exam Constitutional:      Appearance: Normal appearance. He is not ill-appearing.  HENT:     Head: Normocephalic.     Nose: Nose normal.     Mouth/Throat:     Mouth: Mucous membranes are moist.  Cardiovascular:     Rate and Rhythm: Normal rate and regular rhythm.     Pulses: Normal pulses.     Heart  sounds: Normal heart sounds.  Pulmonary:     Effort: Pulmonary effort is normal.     Breath sounds: Rales present.  Abdominal:     Palpations: Abdomen is soft.  Musculoskeletal:     Right lower leg: No edema.     Left lower leg: No edema.  Neurological:     General: No focal deficit present.     Mental Status: He is alert and oriented to person, place, and time. Mental status is at baseline.       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   DVT (deep venous thrombosis) (HCC)    pt had surgery to remove clot on right leg   Hyperlipidemia    Hypertension    Skin cancer of scalp    surgery 12/13/2022     Family History  Problem Relation Age of Onset   Prostate cancer Neg Hx    Bladder Cancer Neg Hx    Kidney cancer Neg Hx      Past Surgical History:  Procedure Laterality Date  BACK SURGERY     COLONOSCOPY WITH PROPOFOL  N/A 11/05/2019   Procedure: COLONOSCOPY WITH PROPOFOL ;  Surgeon: Luke Salaam, MD;  Location: [facility] ENDOSCOPY;  Service: Gastroenterology;  Laterality: N/A;   IVC FILTER REMOVAL N/A 11/09/2022   Procedure: IVC FILTER REMOVAL;  Surgeon: Celso College, MD;  Location: [facility] INVASIVE CV LAB;  Service: Cardiovascular;  Laterality: N/A;   MOHS SURGERY  12/13/2022   scalp of head   PARTIAL COLECTOMY N/A 02/11/2020   Procedure: PARTIAL COLECTOMY -- sigmoid;  Surgeon: Emmalene Hare, MD;  Location: [facility] ORS;  Service: General;  Laterality: N/A;   PERIPHERAL VASCULAR THROMBECTOMY Right 10/07/2022   Procedure: PERIPHERAL VASCULAR THROMBECTOMY;  Surgeon: Celso College, MD;  Location: [facility] INVASIVE CV LAB;  Service: Cardiovascular;  Laterality: Right;   PERIPHERAL VASCULAR THROMBECTOMY N/A 10/27/2022   Procedure: PERIPHERAL VASCULAR THROMBECTOMY;  Surgeon: Celso College, MD;  Location: [facility] INVASIVE CV LAB;  Service: Cardiovascular;  Laterality: N/A;   PERIPHERAL VASCULAR THROMBECTOMY Right  11/07/2022   Procedure: PERIPHERAL VASCULAR THROMBECTOMY;  Surgeon: Celso College, MD;  Location: [facility] INVASIVE CV LAB;  Service: Cardiovascular;  Laterality: Right;   PERIPHERAL VASCULAR THROMBECTOMY N/A 11/09/2022   Procedure: PERIPHERAL VASCULAR THROMBECTOMY;  Surgeon: Celso College, MD;  Location: [facility] INVASIVE CV LAB;  Service: Cardiovascular;  Laterality: N/A;  Inferior Vena Cava Thrombectomy   PERIPHERAL VASCULAR THROMBECTOMY Bilateral 11/28/2022   Procedure: PERIPHERAL VASCULAR THROMBECTOMY;  Surgeon: Celso College, MD;  Location: [facility] INVASIVE CV LAB;  Service: Cardiovascular;  Laterality: Bilateral;   SPINE SURGERY     TAKE DOWN OF INTESTINAL FISTULA N/A 02/11/2020   Procedure: TAKE DOWN OF INTESTINAL FISTULA -- colovesical fistula;  Surgeon: Emmalene Hare, MD;  Location: [facility] ORS;  Service: General;  Laterality: N/A;   TRACHEAL SURGERY      Social History   Socioeconomic History   Marital status: Married    Spouse name: Not on file   Number of children: Not on file   Years of education: Not on file   Highest education level: Not on file  Occupational History   Not on file  Tobacco Use   Smoking status: Never   Smokeless tobacco: Never  Vaping Use   Vaping status: Never Used  Substance and Sexual Activity   Alcohol use: No   Drug use: No   Sexual activity: Yes    Birth control/protection: None  Other Topics Concern   Not on file  Social History Narrative   Not on file   Social Drivers of Health   Financial Resource Strain: Low Risk  (05/15/2022)   Received from [facility], [facility]   Overall Financial Resource Strain (CARDIA)    Difficulty of Paying Living Expenses: Not hard at all  Food Insecurity: No Food Insecurity (11/28/2022)   Hunger Vital Sign    Worried About Running Out of Food in the Last Year: Never true    Ran Out of Food in the Last Year: Never true  Transportation Needs: No Transportation Needs (11/28/2022)    PRAPARE - Administrator, Civil Service (Medical): No    Lack of Transportation (Non-Medical): No  Physical Activity: Insufficiently Active (06/07/2018)   [facility], [facility]   Exercise Vital Sign    Days of Exercise per Week: 3 days    Minutes of Exercise per Session: 30 min  Stress: Not on file  Social Connections: Not on file  Intimate Partner Violence: Not At Risk (11/28/2022)   Humiliation, Afraid, Rape, and Kick questionnaire    Fear of Current or Ex-Partner: No    Emotionally Abused: No    Physically Abused: No    Sexually Abused: No     Allergies  Allergen Reactions   Penicillins Swelling   Shrimp [Shellfish Allergy] Hives   Cephalosporins Hives and Itching   Cisatracurium Other (See Comments)    Other reaction(s): Unknown Other reaction(s): Unknown    Heparin  Hives and Swelling    Patient's daughter reports patient had swelling/hives during hospital admission in [place]. They were on several medications at once so she is unsure whether it was VANCOMYCIN that caused this reaction or another concomitant medication.    Rocuronium  Other (See Comments)    Other reaction(s): Unknown Other reaction(s): Unknown    Vancomycin Hives and Swelling    Patient's daughter reports patient had swelling/hives during hospital admission in [place]. They were on several medications at once so she is unsure whether it was VANCOMYCIN that caused this reaction or another concomitant medication.    Wound Dressings Rash    Tegaderm      CBC    Component Value Date/Time   WBC 6.5 10/31/2023 1332   RBC 4.74 10/31/2023 1332   HGB 14.6 10/31/2023 1332   HGB 12.6 (L) 01/23/2023 1002   HCT 43.9 10/31/2023 1332   HCT 41.1 01/23/2023 1002   PLT 188 10/31/2023 1332   PLT 171 01/23/2023 1002   MCV 92.6 10/31/2023 1332   MCV 83 01/23/2023 1002   MCH 30.8 10/31/2023 1332   MCHC 33.3 10/31/2023 1332   RDW 13.9 10/31/2023 1332   RDW  20.2 (H) 01/23/2023 1002   LYMPHSABS 1.6 10/31/2023 1332   LYMPHSABS 1.5 01/23/2023 1002   MONOABS 0.6 10/31/2023 1332   EOSABS 0.3 10/31/2023 1332   EOSABS 0.4 01/23/2023 1002   BASOSABS 0.1 10/31/2023 1332   BASOSABS 0.1 01/23/2023 1002    Pulmonary Functions Testing Results:    Latest Ref Rng & Units 11/09/2023   11:01 AM  PFT Results  FVC-Pre L 3.27   FVC-Predicted Pre % 72   FVC-Post L 3.23   FVC-Predicted Post % 72   Pre FEV1/FVC % % 76   Post FEV1/FCV % % 81   FEV1-Pre L 2.49   FEV1-Predicted Pre % 76   FEV1-Post L 2.62   DLCO uncorrected ml/min/mmHg 16.72   DLCO UNC% % 63   DLVA Predicted % 75     Outpatient Medications Prior to Visit  Medication Sig Dispense Refill   doxycycline  (VIBRAMYCIN ) 100 MG capsule Take 100 mg by mouth daily.     DULoxetine (CYMBALTA) 30 MG capsule Take by mouth.     Emollient (AVEENO ACTIVE NATURALS DAILY) LOTN Apply 1 Application topically in the morning and at bedtime. 198 g 0   ferrous sulfate  325 (65 FE) MG tablet Take by mouth.     gabapentin  (NEURONTIN ) 300 MG capsule Take 300 mg by mouth 3 (three) times daily.     lidocaine  (XYLOCAINE ) 5 % ointment Apply 1 Application topically 3 (three) times daily as needed for mild pain.     rivaroxaban  (XARELTO ) 20 MG TABS tablet Take 1 tablet (20 mg total) by mouth daily with supper. 30 tablet 3   No facility-administered medications prior to visit.

## 2023-12-11 ENCOUNTER — Ambulatory Visit: Payer: 59 | Admitting: Student in an Organized Health Care Education/Training Program

## 2023-12-14 NOTE — Progress Notes (Signed)
12/19/2023 11:10 AM   Russell [name] [birth year] [id number]  Referring provider: No referring provider defined for this encounter.  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 BPH with LU TS -PSA (2022) 0.99 -cysto (2020) -moderate lateral lobe enlargement -Tamsulosin 0.4 mg daily  Chief Complaint  Patient presents with   Benign Prostatic Hypertrophy   HPI: Russell [name] is a 73 y.o. male who presents today for three month follow up with interpreter, Marchelle Folks  Previous records reviewed.   I PSS 30/6  PVR 0 mL  He states the tamsulosin prescribed made no difference in his urinary symptoms.  He has nocturia x 4 and is visiting the restroom about every hour during the day.  He states the bathroom is controlling his life.  Patient denies any modifying or aggravating factors.  Patient denies any recent UTI's, gross hematuria, dysuria or suprapubic/flank pain.  Patient denies any fevers, chills, nausea or vomiting.    He is having satisfactory erections.   IPSS     Row Name 12/19/23 1000         International Prostate Symptom Score   How often have you had the sensation of not emptying your bladder? Almost always     How often have you had to urinate less than every two hours? Almost always     How often have you found you stopped and started again several times when you urinated? About half the time     How often have you found it difficult to postpone urination? Almost always     How often have you had a weak urinary stream? About half the time     How often have you had to strain to start urination? Almost always     How many times did you typically get up at night to urinate? 4 Times     Total IPSS Score 30       Quality of Life due to urinary symptoms   If you were to spend the rest of your life with your urinary condition just the way it is now how would you feel about that? 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   DVT (deep venous thrombosis) (HCC)    pt had surgery to remove clot on right leg   Hyperlipidemia    Hypertension    Skin cancer of scalp    surgery 12/13/2022    Surgical History: Past Surgical History:  Procedure Laterality Date   BACK SURGERY     COLONOSCOPY WITH PROPOFOL N/A 11/05/2019   Procedure: COLONOSCOPY WITH PROPOFOL;  Surgeon: Wyline Mood, MD;  Location[facility] ENDOSCOPY;  Service: Gastroenterology;  Laterality: N/A;   IVC FILTER REMOVAL N/A 11/09/2022   Procedure: IVC FILTER REMOVAL;  Surgeon: Annice Needy, MD;  Location: [facility] INVASIVE CV LAB;  Service: Cardiovascular;  Laterality: N/A;   MOHS SURGERY  12/13/2022   scalp of head   PARTIAL COLECTOMY N/A 02/11/2020   Procedure: PARTIAL COLECTOMY -- sigmoid;  Surgeon: Henrene Dodge, MD;  Location: [facility] ORS;  Service: General;  Laterality: N/A;   PERIPHERAL VASCULAR THROMBECTOMY Right 10/07/2022   Procedure: PERIPHERAL VASCULAR THROMBECTOMY;  Surgeon: Annice Needy, MD;  Location: [facility] INVASIVE CV LAB;  Service: Cardiovascular;  Laterality: Right;   PERIPHERAL VASCULAR THROMBECTOMY N/A 10/27/2022   Procedure: PERIPHERAL VASCULAR THROMBECTOMY;  Surgeon: Annice Needy, MD;  Location: [facility] INVASIVE CV LAB;  Service: Cardiovascular;  Laterality: N/A;   PERIPHERAL VASCULAR THROMBECTOMY Right 11/07/2022   Procedure: PERIPHERAL VASCULAR THROMBECTOMY;  Surgeon: Annice Needy, MD;  Location: [facility] INVASIVE CV LAB;  Service: Cardiovascular;  Laterality: Right;   PERIPHERAL VASCULAR THROMBECTOMY N/A 11/09/2022   Procedure: PERIPHERAL VASCULAR THROMBECTOMY;  Surgeon: Annice Needy, MD;  Location: [facility] INVASIVE CV LAB;  Service: Cardiovascular;  Laterality: N/A;  Inferior Vena Cava Thrombectomy   PERIPHERAL VASCULAR THROMBECTOMY Bilateral 11/28/2022   Procedure: PERIPHERAL VASCULAR THROMBECTOMY;   Surgeon: Annice Needy, MD;  Location: [facility] INVASIVE CV LAB;  Service: Cardiovascular;  Laterality: Bilateral;   SPINE SURGERY     TAKE DOWN OF INTESTINAL FISTULA N/A 02/11/2020   Procedure: TAKE DOWN OF INTESTINAL FISTULA -- colovesical fistula;  Surgeon: Henrene Dodge, MD;  Location: [facility] ORS;  Service: General;  Laterality: N/A;   TRACHEAL SURGERY      Home Medications:  Allergies as of 12/19/2023       Reactions   Penicillins Swelling   Shrimp [shellfish Allergy] Hives   Cephalosporins Hives, Itching   Cisatracurium Other (See Comments)   Other reaction(s): Unknown Other reaction(s): Unknown   Heparin Hives, Swelling   Patient's daughter reports patient had swelling/hives during hospital admission in [place]. They were on several medications at once so she is unsure whether it was VANCOMYCIN that caused this reaction or another concomitant medication.    Rocuronium Other (See Comments)   Other reaction(s): Unknown Other reaction(s): Unknown   Vancomycin Hives, Swelling   Patient's daughter reports patient had swelling/hives during hospital admission in [place]. They were on several medications at once so she is unsure whether it was VANCOMYCIN that caused this reaction or another concomitant medication.    Wound Dressings Rash   Tegaderm        Medication List        Accurate as of December 19, 2023 11:10 AM. If you have any questions, ask your nurse or doctor.          STOP taking these medications    Aveeno Active Naturals Daily Lotn   DULoxetine 30 MG capsule Commonly known as: CYMBALTA   ferrous sulfate 325 (65 FE) MG tablet   gabapentin 300 MG capsule Commonly known as: NEURONTIN       TAKE these medications    doxycycline 100 MG capsule Commonly known as: VIBRAMYCIN Take 100 mg by mouth daily.   lidocaine 5 % ointment Commonly known as: XYLOCAINE Apply 1 Application topically 3 (three) times daily as needed for mild pain.   oxybutynin 10 MG 24 hr  tablet Commonly known as: DITROPAN-XL Take 1 tablet (10 mg total) by mouth daily.   rivaroxaban 20 MG Tabs tablet Commonly known as: XARELTO Take 1 tablet (20 mg total) by mouth daily with supper.        Allergies:  Allergies  Allergen Reactions   Penicillins Swelling   Shrimp [Shellfish Allergy] Hives   Cephalosporins Hives and Itching   Cisatracurium Other (See Comments)    Other reaction(s): Unknown Other reaction(s): Unknown    Heparin Hives and Swelling    Patient's daughter reports patient had swelling/hives during hospital admission in [place]. They were on several medications at once so she is unsure whether it was VANCOMYCIN that caused this reaction or another concomitant medication.    Rocuronium  Other (See Comments)    Other reaction(s): Unknown Other reaction(s): Unknown    Vancomycin Hives and Swelling    Patient's daughter reports patient had swelling/hives during hospital admission in [place]. They were on several medications at once so she is unsure whether it was VANCOMYCIN that caused this reaction or another concomitant medication.    Wound Dressings Rash    Tegaderm     Family History: Family History  Problem Relation Age of Onset   Prostate cancer Neg Hx    Bladder Cancer Neg Hx    Kidney cancer Neg Hx     Social History:  reports that he has never smoked. He has never used smokeless tobacco. He reports that he does not drink alcohol and does not use drugs.  ROS: Pertinent ROS in HPI  Physical Exam: BP (!) 99/59   Pulse (!) 118   Ht 5\' 10"  (1.778 m)   Wt 200 lb (90.7 kg)   BMI 28.70 kg/m   Constitutional:  Well nourished. Alert and oriented, No acute distress. HEENT: [place] AT, moist mucus membranes.  Trachea midline, no masses. Cardiovascular: No clubbing, cyanosis, or edema. Respiratory: Normal respiratory effort, no increased work of breathing. Neurologic: Grossly intact, no focal deficits, moving all 4 extremities. Psychiatric: Normal mood  and affect.  Laboratory Data: Lab Results  Component Value Date   WBC 6.5 10/31/2023   HGB 14.6 10/31/2023   HCT 43.9 10/31/2023   MCV 92.6 10/31/2023   PLT 188 10/31/2023    Lab Results  Component Value Date   CREATININE 1.09 10/31/2023    Lab Results  Component Value Date   AST 11 01/23/2023   Lab Results  Component Value Date   ALT 12 01/23/2023  I have reviewed the labs.   Pertinent Imaging:  12/19/23 10:17  Scan Result 0     Assessment & Plan:    1. BPH with LUTS -Aged out of prostate cancer screening -PVR < 300 cc  -most bothersome symptoms are nocturia x 4 and voiding every hour during the day -no benefit with tamsulosin 0.4 mg -explained how the tamsulosin works and if his symptoms are caused by irritation of the bladder, we will need to address this with an OAB  2. OAB -We discussed that overactive bladder (OAB) is not a disease, but is a symptom complex that is generally not life-threatening -Symptoms typically include urinary urgency, frequency, and urge incontinence.   -There are numerous treatment options: behavioral therapies (including bladder training, pelvic floor muscle training, and fluid management), oral antimuscarinics and beta-3 agonist (Mybetriq) and minimally invasive options include intra-detrusor botox, peripheral tibial nerve stimulation (PTNS), and interstim (SNS).  -These are the anticholinergic medications available for OAB:     *Darifenacin (Enablex)  *Fesoterodine (Toviaz)  Oxybutynin (Ditropan, Ditropan XL, Gelnique, Oxytrol)  Solifenacin (Vesicare)  Tolterodine (Detrol, Detrol LA)  *Trospium (Sanctura, Sanctura XR)  -The anticholinergic medications are available in generics, but they have the side effects of dry mouth, dry eyes, constipation, risk of developing dementia/Alzheimer's disease or the worsening of any dementia/Alzheimer's disease (the * have less risk anecdotally) -These are the beta-3 adrenergic  agonists:  Mirabegron (Myrbetriq)  Vibegron (Gemtesa)  -The beta-3 adrenergic agonists are brand name only and have limited insurance coverage and are expensive as an out of pocket options -His insurance will only cover oxybutynin, so we discussed the side effects and he would like to try this medication -He will follow-up in 6 weeks and if he does not see  benefit or the side effects are intolerable, we will consider cystoscopy for further evaluation or checking a hemoglobin A1c   Return in about 6 weeks (around 01/30/2024) for I PSS, PVR .  These notes generated with voice recognition software. I apologize for typographical errors.  Cloretta Ned  [facility] [address]  [address] [place], [place] [address] [phone]

## 2023-12-19 ENCOUNTER — Ambulatory Visit: Payer: 59 | Admitting: Urology

## 2023-12-19 ENCOUNTER — Encounter: Payer: Self-pay | Admitting: Urology

## 2023-12-19 VITALS — BP 99/59 | HR 118 | Ht 70.0 in | Wt 200.0 lb

## 2023-12-19 DIAGNOSIS — N3281 Overactive bladder: Secondary | ICD-10-CM | POA: Diagnosis not present

## 2023-12-19 DIAGNOSIS — R3912 Poor urinary stream: Secondary | ICD-10-CM

## 2023-12-19 DIAGNOSIS — N401 Enlarged prostate with lower urinary tract symptoms: Secondary | ICD-10-CM

## 2023-12-19 LAB — BLADDER SCAN AMB NON-IMAGING: Scan Result: 0

## 2023-12-19 MED ORDER — OXYBUTYNIN CHLORIDE ER 10 MG PO TB24
10.0000 mg | ORAL_TABLET | Freq: Every day | ORAL | 0 refills | Status: DC
Start: 2023-12-19 — End: 2024-02-06

## 2024-01-30 ENCOUNTER — Ambulatory Visit: Payer: Self-pay | Admitting: Urology

## 2024-02-05 NOTE — Progress Notes (Unsigned)
 02/06/2024 3:30 PM   Russell [name] 20-Jul-1951 [id number]  Referring provider: No referring provider defined for this encounter.  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 BPH with LU TS -PSA (2022) 0.99 -cysto (2020) -moderate lateral lobe enlargement -Tamsulosin 0.4 mg daily  Chief Complaint  Patient presents with   Follow-up   HPI: Russell [name] is a 73 y.o. male who presents today for three month follow up with interpreter, Claretha Cooper.  Previous records reviewed.   At his visit on 12/19/2023, I PSS 30/6.  PVR 0 mL.  He states the tamsulosin prescribed made no difference in his urinary symptoms.  He has nocturia x 4 and is visiting the restroom about every hour during the day.  He states the bathroom is controlling his life.  Patient denies any modifying or aggravating factors.  Patient denies any recent UTI's, gross hematuria, dysuria or suprapubic/flank pain.  Patient denies any fevers, chills, nausea or vomiting.  He is having satisfactory erections.  He was started on oxybutynin XL  10 mg daily.    I PSS 12/3  He has noticed that the oxybutynin has decreased his urinary leakage.  It does give him dry mouth, but it is tolerable.  Patient denies any modifying or aggravating factors.  Patient denies any recent UTI's, gross hematuria, dysuria or suprapubic/flank pain.  Patient denies any fevers, chills, nausea or vomiting.    He also requested a refill for his Viagra to be sent to his pharmacy as well.   IPSS     Row Name 02/06/24 1500         International Prostate Symptom Score   How often have you had the sensation of not emptying your bladder? Not at All     How often have you had to urinate less than every two hours? More than half the time     How often have you found you stopped and started again several times when you urinated? About half the time     How often have you found it difficult to postpone urination?  Not at All     How often have you had a weak urinary stream? Less than 1 in 5 times     How often have you had to strain to start urination? Not at All     How many times did you typically get up at night to urinate? 4 Times     Total IPSS Score 12       Quality of Life due to urinary symptoms   If you were to spend the rest of your life with your urinary condition just the way it is now how would you feel about that? 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   DVT (deep venous thrombosis) (HCC)    pt had surgery to remove clot on right leg   Hyperlipidemia    Hypertension    Skin cancer of scalp    surgery 12/13/2022    Surgical History: Past Surgical History:  Procedure Laterality Date   BACK SURGERY     COLONOSCOPY WITH PROPOFOL N/A 11/05/2019   Procedure: COLONOSCOPY WITH PROPOFOL;  Surgeon: Wyline Mood, MD;  Location: [facility] ENDOSCOPY;  Service: Gastroenterology;  Laterality: N/A;   IVC  FILTER REMOVAL N/A 11/09/2022   Procedure: IVC FILTER REMOVAL;  Surgeon: Annice Needy, MD;  Location: [facility] INVASIVE CV LAB;  Service: Cardiovascular;  Laterality: N/A;   MOHS SURGERY  12/13/2022   scalp of head   PARTIAL COLECTOMY N/A 02/11/2020   Procedure: PARTIAL COLECTOMY -- sigmoid;  Surgeon: Henrene Dodge, MD;  Location: [facility] ORS;  Service: General;  Laterality: N/A;   PERIPHERAL VASCULAR THROMBECTOMY Right 10/07/2022   Procedure: PERIPHERAL VASCULAR THROMBECTOMY;  Surgeon: Annice Needy, MD;  Location: [facility] INVASIVE CV LAB;  Service: Cardiovascular;  Laterality: Right;   PERIPHERAL VASCULAR THROMBECTOMY N/A 10/27/2022   Procedure: PERIPHERAL VASCULAR THROMBECTOMY;  Surgeon: Annice Needy, MD;  Location: [facility] INVASIVE CV LAB;  Service: Cardiovascular;  Laterality: N/A;   PERIPHERAL VASCULAR THROMBECTOMY Right 11/07/2022   Procedure: PERIPHERAL VASCULAR  THROMBECTOMY;  Surgeon: Annice Needy, MD;  Location: [facility] INVASIVE CV LAB;  Service: Cardiovascular;  Laterality: Right;   PERIPHERAL VASCULAR THROMBECTOMY N/A 11/09/2022   Procedure: PERIPHERAL VASCULAR THROMBECTOMY;  Surgeon: Annice Needy, MD;  Location: [facility] INVASIVE CV LAB;  Service: Cardiovascular;  Laterality: N/A;  Inferior Vena Cava Thrombectomy   PERIPHERAL VASCULAR THROMBECTOMY Bilateral 11/28/2022   Procedure: PERIPHERAL VASCULAR THROMBECTOMY;  Surgeon: Annice Needy, MD;  Location: [facility] INVASIVE CV LAB;  Service: Cardiovascular;  Laterality: Bilateral;   SPINE SURGERY     TAKE DOWN OF INTESTINAL FISTULA N/A 02/11/2020   Procedure: TAKE DOWN OF INTESTINAL FISTULA -- colovesical fistula;  Surgeon: Henrene Dodge, MD;  Location: [facility] ORS;  Service: General;  Laterality: N/A;   TRACHEAL SURGERY      Home Medications:  Allergies as of 02/06/2024       Reactions   Penicillins Swelling   Shrimp [shellfish Allergy] Hives   Cephalosporins Hives, Itching   Cisatracurium Other (See Comments)   Other reaction(s): Unknown Other reaction(s): Unknown   Heparin Hives, Swelling   Patient's daughter reports patient had swelling/hives during hospital admission in [place]. They were on several medications at once so she is unsure whether it was VANCOMYCIN that caused this reaction or another concomitant medication.    Rocuronium Other (See Comments)   Other reaction(s): Unknown Other reaction(s): Unknown   Vancomycin Hives, Swelling   Patient's daughter reports patient had swelling/hives during hospital admission in [place]. They were on several medications at once so she is unsure whether it was VANCOMYCIN that caused this reaction or another concomitant medication.    Wound Dressings Rash   Tegaderm        Medication List        Accurate as of February 06, 2024  3:30 PM. If you have any questions, ask your nurse or doctor.          doxycycline 100 MG capsule Commonly known as:  VIBRAMYCIN Take 100 mg by mouth daily.   DULoxetine 30 MG capsule Commonly known as: CYMBALTA Take 30 mg by mouth daily.   lidocaine 5 % ointment Commonly known as: XYLOCAINE Apply 1 Application topically 3 (three) times daily as needed for mild pain.   oxybutynin 10 MG 24 hr tablet Commonly known as: DITROPAN-XL Take 1 tablet (10 mg total) by mouth daily.   rivaroxaban 20 MG Tabs tablet Commonly known as: XARELTO Take 1 tablet (20 mg total) by mouth daily with supper.   sildenafil 100 MG tablet Commonly known as: Viagra Take 1 tablet (100 mg total) by mouth daily as needed for erectile dysfunction. Started by: Michiel Cowboy  Allergies:  Allergies  Allergen Reactions   Penicillins Swelling   Shrimp [Shellfish Allergy] Hives   Cephalosporins Hives and Itching   Cisatracurium Other (See Comments)    Other reaction(s): Unknown Other reaction(s): Unknown    Heparin Hives and Swelling    Patient's daughter reports patient had swelling/hives during hospital admission in [place]. They were on several medications at once so she is unsure whether it was VANCOMYCIN that caused this reaction or another concomitant medication.    Rocuronium Other (See Comments)    Other reaction(s): Unknown Other reaction(s): Unknown    Vancomycin Hives and Swelling    Patient's daughter reports patient had swelling/hives during hospital admission in [place]. They were on several medications at once so she is unsure whether it was VANCOMYCIN that caused this reaction or another concomitant medication.    Wound Dressings Rash    Tegaderm     Family History: Family History  Problem Relation Age of Onset   Prostate cancer Neg Hx    Bladder Cancer Neg Hx    Kidney cancer Neg Hx     Social History:  reports that he has never smoked. He has never used smokeless tobacco. He reports that he does not drink alcohol and does not use drugs.  ROS: Pertinent ROS in HPI  Physical Exam: BP  111/77   Pulse 86   Ht 6\' 10"  (2.083 m)   Wt 200 lb (90.7 kg)   BMI 20.91 kg/m   Constitutional:  Well nourished. Alert and oriented, No acute distress. HEENT: [place] AT, moist mucus membranes.  Trachea midline Cardiovascular: No clubbing, cyanosis, or edema. Respiratory: Normal respiratory effort, no increased work of breathing. Neurologic: Grossly intact, no focal deficits, moving all 4 extremities. Psychiatric: Normal mood and affect.   Laboratory Data: N/A  Pertinent Imaging:  12/19/23 10:17  Scan Result 0     Assessment & Plan:    1. BPH with LUTS -Aged out of prostate cancer screening -PVR < 300 cc  -most bothersome symptoms are nocturia x 4 and voiding every hour during the day -explained how the tamsulosin works and if his symptoms are caused by irritation of the bladder, we will need to address this with an OAB  2. OAB -Continue oxybutynin XL 10 mg daily -Will have him follow-up in 3 months to see how well he continues to tolerate his dry mouth  3. ED -Refill given for Viagra 100 mg on demand dosing, patient confirmed he is not taking nitrates and I advised him not to take with nitrates   Return in about 3 months (around 05/08/2024) for I PSS, PVR .  These notes generated with voice recognition software. I apologize for typographical errors.  Cloretta Ned  [facility] [address]  [address] [place], [place] [address] [phone]

## 2024-02-06 ENCOUNTER — Ambulatory Visit (INDEPENDENT_AMBULATORY_CARE_PROVIDER_SITE_OTHER): Admitting: Urology

## 2024-02-06 ENCOUNTER — Encounter: Payer: Self-pay | Admitting: Urology

## 2024-02-06 VITALS — BP 111/77 | HR 86 | Ht >= 80 in | Wt 200.0 lb

## 2024-02-06 DIAGNOSIS — N5201 Erectile dysfunction due to arterial insufficiency: Secondary | ICD-10-CM | POA: Diagnosis not present

## 2024-02-06 DIAGNOSIS — R3912 Poor urinary stream: Secondary | ICD-10-CM | POA: Diagnosis not present

## 2024-02-06 DIAGNOSIS — N3281 Overactive bladder: Secondary | ICD-10-CM | POA: Diagnosis not present

## 2024-02-06 DIAGNOSIS — N401 Enlarged prostate with lower urinary tract symptoms: Secondary | ICD-10-CM

## 2024-02-06 MED ORDER — SILDENAFIL CITRATE 100 MG PO TABS
100.0000 mg | ORAL_TABLET | Freq: Every day | ORAL | 3 refills | Status: DC | PRN
Start: 2024-02-06 — End: 2024-05-14

## 2024-02-06 MED ORDER — OXYBUTYNIN CHLORIDE ER 10 MG PO TB24
10.0000 mg | ORAL_TABLET | Freq: Every day | ORAL | 3 refills | Status: AC
Start: 2024-02-06 — End: ?

## 2024-03-05 ENCOUNTER — Other Ambulatory Visit (INDEPENDENT_AMBULATORY_CARE_PROVIDER_SITE_OTHER): Payer: Self-pay | Admitting: Nurse Practitioner

## 2024-03-05 ENCOUNTER — Telehealth (INDEPENDENT_AMBULATORY_CARE_PROVIDER_SITE_OTHER): Payer: Self-pay | Admitting: Nurse Practitioner

## 2024-03-05 NOTE — Telephone Encounter (Signed)
 Pt came in stating he needed a refill of medication pt was LS on 12/01/22 and no showed an appt on 12/30/22. Please advise. Pt call back number is [phone].

## 2024-03-06 ENCOUNTER — Other Ambulatory Visit (INDEPENDENT_AMBULATORY_CARE_PROVIDER_SITE_OTHER): Payer: Self-pay | Admitting: Nurse Practitioner

## 2024-03-06 MED ORDER — RIVAROXABAN 20 MG PO TABS: 20.0000 mg | ORAL_TABLET | Freq: Every day | ORAL | 2 refills | Status: DC

## 2024-03-06 NOTE — Telephone Encounter (Signed)
 I have sent in a refill given his history.  However, he will need to see us  or his hematologist before he can have further refills

## 2024-03-06 NOTE — Telephone Encounter (Signed)
Spoke with the patient and gave the recommendation from Fallon Brown NP. See notes below. 

## 2024-03-22 ENCOUNTER — Emergency Department
Admission: EM | Admit: 2024-03-22 | Discharge: 2024-03-22 | Disposition: A | Discharge status: Home / Self Care | Attending: Emergency Medicine | Admitting: Emergency Medicine

## 2024-03-22 ENCOUNTER — Other Ambulatory Visit: Payer: Self-pay

## 2024-03-22 DIAGNOSIS — W458XXA Other foreign body or object entering through skin, initial encounter: Secondary | ICD-10-CM | POA: Diagnosis not present

## 2024-03-22 DIAGNOSIS — S60351A Superficial foreign body of right thumb, initial encounter: Secondary | ICD-10-CM | POA: Diagnosis present

## 2024-03-22 DIAGNOSIS — S60459A Superficial foreign body of unspecified finger, initial encounter: Secondary | ICD-10-CM

## 2024-03-22 NOTE — ED Triage Notes (Signed)
 Pt arrives via POV with c/o splinter in their right thumb. Pt states that they thought they got all of the splinter out but now the thumb is hurting and he wants all of the splinter removed. Pt is A&Ox4.

## 2024-03-22 NOTE — ED Provider Notes (Signed)
 [facility] Emergency Department Provider Note     Event Date/Time   First MD Initiated Contact with Patient 03/22/24 1409     (approximate)   History   foreign body in finger   HPI  Russell [name] is a 73 y.o. male presents to the ED for evaluation of a possible splinter in his right thumb nail.  Patient reports yesterday he was working in wood and believes he has a splinter underneath his nailbed.  He reports he tried to remove the splinter with a black ink pen, but there is a piece that still remains.  Denies numbness.  No other complaint     Physical Exam   Triage Vital Signs: ED Triage Vitals  Encounter Vitals Group     BP 03/22/24 1320 108/77     Systolic BP Percentile --      Diastolic BP Percentile --      Pulse Rate 03/22/24 1320 96     Resp 03/22/24 1320 15     Temp 03/22/24 1320 98 F (36.7 C)     Temp Source 03/22/24 1320 Oral     SpO2 03/22/24 1320 94 %     Weight 03/22/24 1323 192 lb (87.1 kg)     Height 03/22/24 1323 5\' 10"  (1.778 m)     Head Circumference --      Peak Flow --      Pain Score 03/22/24 1320 8     Pain Loc --      Pain Education --      Exclude from Growth Chart --     Most recent vital signs: Vitals:   03/22/24 1320  BP: 108/77  Pulse: 96  Resp: 15  Temp: 98 F (36.7 C)  SpO2: 94%    General Awake, no distress.  HEENT NCAT. PERRL. EOMI.  CV:  Good peripheral perfusion.  RESP:  Normal effort.  ABD:  No distention.  Other:  Right thumb nailbed is intact.  No obvious wooden splinter noted.    ED Results / Procedures / Treatments   Labs (all labs ordered are listed, but only abnormal results are displayed) Labs Reviewed - No data to display  No results found.  PROCEDURES:  Critical Care performed: No  Procedures   MEDICATIONS ORDERED IN ED: Medications - No data to display   IMPRESSION / MDM / ASSESSMENT AND PLAN / ED COURSE  I reviewed the triage vital signs and the nursing  notes.                                 73 y.o. male presents to the emergency department for evaluation and treatment of nailbed splinter. See HPI for further details.   Differential diagnosis includes, but is not limited to foreign body, felon, paronychia  Patient's presentation is most consistent with acute complicated illness / injury requiring diagnostic workup.  Patient is alert and oriented.  He is hemodynamic stable.  Physical exam findings are as stated above.  No obvious foreign body noted for removal.  Nail was trimmed and slightly lifted to remove what possibly could be a wooden splinter.  This provided some relief for the patient.  Reassurance provided.  Patient does not meet admission criteria and is in stable condition for discharge home and outpatient management.  Encouraged to follow-up with his PCP for further evaluation if needed.  ED return precautions discussed.  FINAL CLINICAL  IMPRESSION(S) / ED DIAGNOSES   Final diagnoses:  Acute foreign body of fingernail, initial encounter   Rx / DC Orders   ED Discharge Orders     None        Note:  This document was prepared using Dragon voice recognition software and may include unintentional dictation errors.    Phyllis Breeze, Danyah Guastella A, PA-C 03/22/24 Marshia Skene, MD 03/23/24 2005

## 2024-03-22 NOTE — Discharge Instructions (Addendum)
 Alternate between applying cold and warm (heating pad) to affected finger to help with pain/discomfort. Take tylenol  or ibuprofen  as needed for pain. Follow up with hand surgeon, Dr. Celestino Cole, if symptoms persist.

## 2024-04-09 ENCOUNTER — Encounter (INDEPENDENT_AMBULATORY_CARE_PROVIDER_SITE_OTHER): Payer: Self-pay

## 2024-04-10 ENCOUNTER — Emergency Department
Admission: EM | Admit: 2024-04-10 | Discharge: 2024-04-10 | Disposition: A | Discharge status: Home / Self Care | Attending: Emergency Medicine | Admitting: Emergency Medicine

## 2024-04-10 ENCOUNTER — Other Ambulatory Visit: Payer: Self-pay

## 2024-04-10 ENCOUNTER — Emergency Department

## 2024-04-10 DIAGNOSIS — W01198A Fall on same level from slipping, tripping and stumbling with subsequent striking against other object, initial encounter: Secondary | ICD-10-CM | POA: Insufficient documentation

## 2024-04-10 DIAGNOSIS — Y9259 Other trade areas as the place of occurrence of the external cause: Secondary | ICD-10-CM | POA: Diagnosis not present

## 2024-04-10 DIAGNOSIS — S2232XB Fracture of one rib, left side, initial encounter for open fracture: Secondary | ICD-10-CM | POA: Diagnosis not present

## 2024-04-10 DIAGNOSIS — S299XXA Unspecified injury of thorax, initial encounter: Secondary | ICD-10-CM | POA: Diagnosis present

## 2024-04-10 MED ORDER — ACETAMINOPHEN 325 MG PO TABS: 650.0000 mg | ORAL_TABLET | ORAL | 0 refills | Status: DC | PRN

## 2024-04-10 MED ORDER — ACETAMINOPHEN 325 MG PO TABS
650.0000 mg | ORAL_TABLET | Freq: Once | ORAL | Status: AC
  Administered 2024-04-10: 650 mg via ORAL
  Filled 2024-04-10: qty 2

## 2024-04-10 MED ORDER — ACETAMINOPHEN ER 650 MG PO TBCR: 650.0000 mg | EXTENDED_RELEASE_TABLET | Freq: Three times a day (TID) | ORAL | 0 refills | Status: AC | PRN

## 2024-04-10 NOTE — ED Triage Notes (Signed)
 Pt to ED via POV c/o rib pain after a fall today. Pt reports he tripped and fell. Having some pain to left side of ribs and mid back. Denies hitting head, no loc

## 2024-04-10 NOTE — ED Provider Notes (Signed)
 [facility] Provider Note    Event Date/Time   First MD Initiated Contact with Patient 04/10/24 2050     (approximate)   History   Rib Injury    HPI  Russell [name] is a 73 y.o. male    with a past medical history of arthritis, benign prostatic hyperplasia, left shoulder arthroplasty,  who presents to the ED complaining of left rib pain. According to the patient, he was shopping at the mall, he tripped on his cane and the cane hit his left chest.  Patient denies cough, shortness of breath, loss of consciousness, hitting his head.  Patient is taking blood thinners.      Physical Exam   Triage Vital Signs: ED Triage Vitals  Encounter Vitals Group     BP 04/10/24 1916 111/79     Systolic BP Percentile --      Diastolic BP Percentile --      Pulse Rate 04/10/24 1916 (!) 118     Resp 04/10/24 1923 18     Temp 04/10/24 1916 97.9 F (36.6 C)     Temp Source 04/10/24 1916 Oral     SpO2 04/10/24 1916 100 %     Weight 04/10/24 1920 190 lb (86.2 kg)     Height 04/10/24 1920 5\' 10"  (1.778 m)     Head Circumference --      Peak Flow --      Pain Score 04/10/24 1919 8     Pain Loc --      Pain Education --      Exclude from Growth Chart --     Most recent vital signs: Vitals:   04/10/24 1916 04/10/24 1923  BP: 111/79   Pulse: (!) 118   Resp:  18  Temp: 97.9 F (36.6 C)   SpO2: 100%      Constitutional: Alert, NAD. Able to speak in complete sentences without cough or dyspnea  Eyes: Conjunctivae are normal.  Head: Atraumatic. Nose: No congestion/rhinnorhea. Mouth/Throat: Mucous membranes are moist.   Neck: Painless ROM. Supple. No JVD, nodes, thyromegaly  Cardiovascular:   Good peripheral circulation.RRR no murmurs, gallops, rubs  Respiratory: Normal respiratory effort.  No retractions. Clear to auscultation bilaterally without wheezing or crackles  Chest: Skin is intact no ecchymosis or hematomas, tender to palpation at the level of eighth  rib with middle clavicular line.  Gastrointestinal: Soft and nontender.  Musculoskeletal:  no deformity Neurologic:  MAE spontaneously. No gross focal neurologic deficits are appreciated.  Skin:  Skin is warm, dry and intact. No rash noted. Psychiatric: Mood and affect are normal. Speech and behavior are normal.    ED Results / Procedures / Treatments   Labs (all labs ordered are listed, but only abnormal results are displayed) Labs Reviewed - No data to display   EKG     RADIOLOGY I independently reviewed and interpreted imaging and agree with radiologists findings.      PROCEDURES:  Critical Care performed:   Procedures   MEDICATIONS ORDERED IN ED: Medications  acetaminophen  (TYLENOL ) tablet 650 mg (has no administration in time range)   Clinical Course as of 04/10/24 2120  Wed Apr 10, 2024  2054 DG Ribs Unilateral W/Chest Left Undisplaced left eighth rib fracture. Multiple healed rib fractures are noted on the left.  Chronic changes in the lungs bilaterally.   [AE]    Clinical Course User Index [AE] Awilda Lennox, PA-C    IMPRESSION / MDM / ASSESSMENT AND PLAN /  ED COURSE  I reviewed the triage vital signs and the nursing notes.  Differential diagnosis includes, but is not limited to, rib fracture, muscle strain, pneumothorax  Patient's presentation is most consistent with acute complicated illness / injury requiring diagnostic workup.   Patient's diagnosis is consistent with left eighth rib fracture. I independently reviewed and interpreted imaging and agree with radiologists findings no pneumothorax. I did review the patient's allergies and medications.The patient is in stable and satisfactory condition for discharge home.  During admission patient received acetaminophen  for pain.  Patient will be discharged home with prescriptions for acetaminophen  650. Patient is to follow up with PCP as needed or otherwise directed. Patient is given ED precautions  to return to the ED for any worsening or new symptoms. Discussed plan of care with patient, answered all of patient's questions, Patient agreeable to plan of care. Advised patient to take medications according to the instructions on the label. Discussed possible side effects of new medications. Patient verbalized understanding.    FINAL CLINICAL IMPRESSION(S) / ED DIAGNOSES   Final diagnoses:  Open fracture of one rib of left side, initial encounter     Rx / DC Orders   ED Discharge Orders          Ordered    acetaminophen  (TYLENOL ) 325 MG tablet  Every 4 hours PRN,   Status:  Discontinued        04/10/24 2118    acetaminophen  (ACETAMINOPHEN  8 HOUR) 650 MG CR tablet  Every 8 hours PRN        04/10/24 2120             Note:  This document was prepared using Dragon voice recognition software and may include unintentional dictation errors.   Awilda Lennox, PA-C 04/10/24 2120    Kandee Orion, MD 04/10/24 2140

## 2024-04-10 NOTE — Discharge Instructions (Signed)
 Have been diagnosed with left rib fracture.  Please take acetaminophen  650 mg every 8 hours as needed for pain.  You can apply cold pack every 3 hours for 20 minutes.  Please come back to ED or go to your PCP if you have new symptoms symptoms worsen

## 2024-05-08 ENCOUNTER — Ambulatory Visit: Admitting: Urology

## 2024-05-14 ENCOUNTER — Other Ambulatory Visit: Payer: Self-pay | Admitting: Urology

## 2024-05-14 ENCOUNTER — Ambulatory Visit: Admitting: Urology

## 2024-05-14 VITALS — BP 117/76 | HR 102 | Ht 69.0 in | Wt 190.0 lb

## 2024-05-14 DIAGNOSIS — R351 Nocturia: Secondary | ICD-10-CM

## 2024-05-14 DIAGNOSIS — N3281 Overactive bladder: Secondary | ICD-10-CM

## 2024-05-14 DIAGNOSIS — R3912 Poor urinary stream: Secondary | ICD-10-CM | POA: Diagnosis not present

## 2024-05-14 DIAGNOSIS — N5201 Erectile dysfunction due to arterial insufficiency: Secondary | ICD-10-CM | POA: Diagnosis not present

## 2024-05-14 DIAGNOSIS — E785 Hyperlipidemia, unspecified: Secondary | ICD-10-CM

## 2024-05-14 DIAGNOSIS — N401 Enlarged prostate with lower urinary tract symptoms: Secondary | ICD-10-CM | POA: Diagnosis not present

## 2024-05-14 LAB — BLADDER SCAN AMB NON-IMAGING

## 2024-05-14 MED ORDER — SILDENAFIL CITRATE 100 MG PO TABS
100.0000 mg | ORAL_TABLET | Freq: Every day | ORAL | 3 refills | Status: DC | PRN
Start: 2024-05-14 — End: 2024-07-12

## 2024-05-15 ENCOUNTER — Other Ambulatory Visit

## 2024-05-15 DIAGNOSIS — E785 Hyperlipidemia, unspecified: Secondary | ICD-10-CM

## 2024-05-15 DIAGNOSIS — R351 Nocturia: Secondary | ICD-10-CM

## 2024-05-16 ENCOUNTER — Ambulatory Visit: Payer: Self-pay | Admitting: Urology

## 2024-05-16 LAB — HEMOGLOBIN A1C
Est. average glucose Bld gHb Est-mCnc: 114 mg/dL
Hgb A1c MFr Bld: 5.6 % (ref 4.8–5.6)

## 2024-05-16 LAB — COMPREHENSIVE METABOLIC PANEL WITH GFR
ALT: 16 IU/L (ref 0–44)
AST: 9 IU/L (ref 0–40)
Albumin: 4.4 g/dL (ref 3.8–4.8)
Alkaline Phosphatase: 113 IU/L (ref 44–121)
BUN/Creatinine Ratio: 17 (ref 10–24)
BUN: 16 mg/dL (ref 8–27)
Bilirubin Total: 0.6 mg/dL (ref 0.0–1.2)
CO2: 18 mmol/L — ABNORMAL LOW (ref 20–29)
Calcium: 9.2 mg/dL (ref 8.6–10.2)
Chloride: 107 mmol/L — ABNORMAL HIGH (ref 96–106)
Creatinine, Ser: 0.96 mg/dL (ref 0.76–1.27)
Globulin, Total: 2.4 g/dL (ref 1.5–4.5)
Glucose: 95 mg/dL (ref 70–99)
Potassium: 4.2 mmol/L (ref 3.5–5.2)
Sodium: 141 mmol/L (ref 134–144)
Total Protein: 6.8 g/dL (ref 6.0–8.5)
eGFR: 83 mL/min/{1.73_m2} (ref >59)

## 2024-05-16 LAB — LIPID PANEL
Chol/HDL Ratio: 4.5 ratio (ref 0.0–5.0)
Cholesterol, Total: 170 mg/dL (ref 100–199)
HDL: 38 mg/dL — ABNORMAL LOW (ref >39)
LDL Chol Calc (NIH): 115 mg/dL — ABNORMAL HIGH (ref 0–99)
Triglycerides: 92 mg/dL (ref 0–149)
VLDL Cholesterol Cal: 17 mg/dL (ref 5–40)

## 2024-05-16 NOTE — Telephone Encounter (Signed)
 Patient came by regarding results. Patient will need to be notified with interpreter.

## 2024-05-17 ENCOUNTER — Encounter: Payer: Self-pay | Admitting: Urology

## 2024-05-17 NOTE — Progress Notes (Signed)
 05/14/2024 9:00 PM   Russell [name] [birth year] [id number]  Referring provider: No referring provider defined for this encounter.  Urological history: 1. 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 BPH with LU TS - PSA (2022) 0.99 - cysto (2020) -moderate lateral lobe enlargement - failed tamsulosin  0.4 mg daily  Chief Complaint  Patient presents with   Benign Prostatic Hypertrophy   HPI: Russell [name] is a 73 y.o. male who presents today for three month follow up with interpreter, Russell [name].  Previous records reviewed.   IPSS score: 11/4  PVR: 6 mL    Previous score: 12/3   Previous PVR: 0 mL    Major complaint(s):  Nocturia x 4 x 5 years. Denies any dysuria, hematuria or suprapubic pain.   Currently taking: oxybutynin  XL 10 mg daily.   He does not find oxybutynin   effective in  treating his  nocturia.    Denies any recent fevers, chills, nausea or vomiting.   IPSS     Row Name 05/14/24 1500         International Prostate Symptom Score   How often have you had the sensation of not emptying your bladder? Not at All     How often have you had to urinate less than every two hours? Less than half the time     How often have you found you stopped and started again several times when you urinated? Not at All     How often have you found it difficult to postpone urination? Not at All     How often have you had a weak urinary stream? Less than 1 in 5 times     How often have you had to strain to start urination? More than half the time     How many times did you typically get up at night to urinate? 4 Times     Total IPSS Score 11       Quality of Life due to urinary symptoms   If you were to spend the rest of your life with your urinary condition just the way it is now how would you feel about that? Mostly Disatisfied        Score:  1-7 Mild 8-19 Moderate 20-35 Severe    SHIM score: 21     Main complaint: Achieving and maintaining an  erection firm enough  for  intercourse  x 10  years Risk factors:  age, BPH, HTN and HLD No  painful erections or curvatures with his erections.    Still having spontaneous erections.  Tried:   Sidenafil 100 mg, on-demand-dosing - he finds it effective    SHIM     Row Name 05/14/24 1517         SHIM: Over the last 6 months:   How do you rate your confidence that you could get and keep an erection? Moderate     When you had erections with sexual stimulation, how often were your erections hard enough for penetration (entering your partner)? Almost Always or Always     During sexual intercourse, how often were you able to maintain your erection after you had penetrated (entered) your partner? Sometimes (about half the time)     During sexual intercourse, how difficult was it to maintain your erection to completion of intercourse? Not Difficult     When you attempted sexual intercourse, how often was it satisfactory for you? Almost Always or Always  SHIM Total Score   SHIM 21        Score: 1-7 Severe ED 8-11 Moderate ED 12-16 Mild-Moderate ED 17-21 Mild ED 22-25 No ED      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   DVT (deep venous thrombosis) (HCC)    pt had surgery to remove clot on right leg   Hyperlipidemia    Hypertension    Skin cancer of scalp    surgery 12/13/2022    Surgical History: Past Surgical History:  Procedure Laterality Date   BACK SURGERY     COLONOSCOPY WITH PROPOFOL  N/A 11/05/2019   Procedure: COLONOSCOPY WITH PROPOFOL ;  Surgeon: Therisa Bi, MD;  Location: [facility] ENDOSCOPY;  Service: Gastroenterology;  Laterality: N/A;   IVC FILTER REMOVAL N/A 11/09/2022   Procedure: IVC FILTER REMOVAL;  Surgeon: Marea Selinda RAMAN, MD;  Location: [facility] INVASIVE CV LAB;  Service: Cardiovascular;  Laterality: N/A;   MOHS SURGERY  12/13/2022   scalp of head   PARTIAL COLECTOMY N/A 02/11/2020   Procedure:  PARTIAL COLECTOMY -- sigmoid;  Surgeon: Desiderio Schanz, MD;  Location: [facility] ORS;  Service: General;  Laterality: N/A;   PERIPHERAL VASCULAR THROMBECTOMY Right 10/07/2022   Procedure: PERIPHERAL VASCULAR THROMBECTOMY;  Surgeon: Marea Selinda RAMAN, MD;  Location: [facility] INVASIVE CV LAB;  Service: Cardiovascular;  Laterality: Right;   PERIPHERAL VASCULAR THROMBECTOMY N/A 10/27/2022   Procedure: PERIPHERAL VASCULAR THROMBECTOMY;  Surgeon: Marea Selinda RAMAN, MD;  Location: [facility] INVASIVE CV LAB;  Service: Cardiovascular;  Laterality: N/A;   PERIPHERAL VASCULAR THROMBECTOMY Right 11/07/2022   Procedure: PERIPHERAL VASCULAR THROMBECTOMY;  Surgeon: Marea Selinda RAMAN, MD;  Location: [facility] INVASIVE CV LAB;  Service: Cardiovascular;  Laterality: Right;   PERIPHERAL VASCULAR THROMBECTOMY N/A 11/09/2022   Procedure: PERIPHERAL VASCULAR THROMBECTOMY;  Surgeon: Marea Selinda RAMAN, MD;  Location: [facility] INVASIVE CV LAB;  Service: Cardiovascular;  Laterality: N/A;  Inferior Vena Cava Thrombectomy   PERIPHERAL VASCULAR THROMBECTOMY Bilateral 11/28/2022   Procedure: PERIPHERAL VASCULAR THROMBECTOMY;  Surgeon: Marea Selinda RAMAN, MD;  Location: [facility] INVASIVE CV LAB;  Service: Cardiovascular;  Laterality: Bilateral;   SPINE SURGERY     TAKE DOWN OF INTESTINAL FISTULA N/A 02/11/2020   Procedure: TAKE DOWN OF INTESTINAL FISTULA -- colovesical fistula;  Surgeon: Desiderio Schanz, MD;  Location: [facility] ORS;  Service: General;  Laterality: N/A;   TRACHEAL SURGERY      Home Medications:  Allergies as of 05/14/2024       Reactions   Penicillins Swelling   Shrimp [shellfish Allergy] Hives   Cephalosporins Hives, Itching   Cisatracurium Other (See Comments)   Other reaction(s): Unknown Other reaction(s): Unknown   Heparin  Hives, Swelling   Patient's daughter reports patient had swelling/hives during hospital admission in [place]. They were on several medications at once so she is unsure whether it was VANCOMYCIN that caused this reaction or another concomitant  medication.    Rocuronium  Other (See Comments)   Other reaction(s): Unknown Other reaction(s): Unknown   Vancomycin Hives, Swelling   Patient's daughter reports patient had swelling/hives during hospital admission in [place]. They were on several medications at once so she is unsure whether it was VANCOMYCIN that caused this reaction or another concomitant medication.    Wound Dressings Rash   Tegaderm        Medication List        Accurate as of May 14, 2024 11:59 PM. If you have any questions,  ask your nurse or doctor.          acetaminophen  650 MG CR tablet Commonly known as: Acetaminophen  8 Hour Take 1 tablet (650 mg total) by mouth every 8 (eight) hours as needed for pain.   doxycycline  100 MG capsule Commonly known as: VIBRAMYCIN  Take 100 mg by mouth daily.   DULoxetine 30 MG capsule Commonly known as: CYMBALTA Take 30 mg by mouth daily.   lidocaine  5 % ointment Commonly known as: XYLOCAINE  Apply 1 Application topically 3 (three) times daily as needed for mild pain.   oxybutynin  10 MG 24 hr tablet Commonly known as: DITROPAN -XL Take 1 tablet (10 mg total) by mouth daily.   rivaroxaban  20 MG Tabs tablet Commonly known as: XARELTO  Take 1 tablet (20 mg total) by mouth daily with supper.   sildenafil  100 MG tablet Commonly known as: Viagra  Take 1 tablet (100 mg total) by mouth daily as needed for erectile dysfunction.        Allergies:  Allergies  Allergen Reactions   Penicillins Swelling   Shrimp [Shellfish Allergy] Hives   Cephalosporins Hives and Itching   Cisatracurium Other (See Comments)    Other reaction(s): Unknown Other reaction(s): Unknown    Heparin  Hives and Swelling    Patient's daughter reports patient had swelling/hives during hospital admission in [place]. They were on several medications at once so she is unsure whether it was VANCOMYCIN that caused this reaction or another concomitant medication.    Rocuronium  Other (See Comments)     Other reaction(s): Unknown Other reaction(s): Unknown    Vancomycin Hives and Swelling    Patient's daughter reports patient had swelling/hives during hospital admission in [place]. They were on several medications at once so she is unsure whether it was VANCOMYCIN that caused this reaction or another concomitant medication.    Wound Dressings Rash    Tegaderm     Family History: Family History  Problem Relation Age of Onset   Prostate cancer Neg Hx    Bladder Cancer Neg Hx    Kidney cancer Neg Hx     Social History:  reports that he has never smoked. He has never used smokeless tobacco. He reports that he does not drink alcohol and does not use drugs.  ROS: Pertinent ROS in HPI  Physical Exam: BP 117/76   Pulse (!) 102   Ht 5' 9 (1.753 m)   Wt 190 lb (86.2 kg)   BMI 28.06 kg/m   Constitutional:  Well nourished. Alert and oriented, No acute distress. HEENT: [place] AT, moist mucus membranes.  Trachea midline, no masses. Cardiovascular: No clubbing, cyanosis, or edema. Respiratory: Normal respiratory effort, no increased work of breathing. Neurologic: Grossly intact, no focal deficits, moving all 4 extremities. Psychiatric: Normal mood and affect.   Laboratory Data: N/A  Pertinent Imaging:  05/14/24 15:07  Scan Result 6ml    Assessment & Plan:    1. BPH with LUTS -Aged out of prostate cancer screening -PVR < 300 cc  -most bothersome symptoms are nocturia x 4  -daytime symptoms are controlled with the oxybutynin   2. OAB -Continue oxybutynin  XL 10 mg daily  3. ED - he said the pharmacy did not have the sildenafil  when I prescribed it at his last visit - new script sent in for sildenafil   4. Nocturia - discussed how sleep apnea contributes to nocturia - his PCP has wanted him to get a sleep study and he will go ahead and let his PCP know that  he is wanting the sleep study - he also wanted some routine labs drawn as his PCP is in [place] and does not want to travel  that far for blood work - return for fasting lipids, CMP and Hbg A1c    Return for after  he completes his sleep study .  These notes generated with voice recognition software. I apologize for typographical errors.  CLOTILDA HELON RIGGERS  [facility] [address]  [address] [place], [place] [address] [phone]

## 2024-07-11 ENCOUNTER — Other Ambulatory Visit (INDEPENDENT_AMBULATORY_CARE_PROVIDER_SITE_OTHER): Payer: Self-pay | Admitting: Nurse Practitioner

## 2024-07-11 ENCOUNTER — Telehealth: Payer: Self-pay | Admitting: Urology

## 2024-07-11 ENCOUNTER — Telehealth (INDEPENDENT_AMBULATORY_CARE_PROVIDER_SITE_OTHER): Payer: Self-pay | Admitting: Vascular Surgery

## 2024-07-11 MED ORDER — RIVAROXABAN 20 MG PO TABS: 20.0000 mg | ORAL_TABLET | Freq: Every day | ORAL | 0 refills | Status: AC

## 2024-07-11 NOTE — Telephone Encounter (Signed)
 Patient dropped in office today and requested that his Sildenafil  be changed to 50 mg. He is currently prescribed 100 mg, and he said that is too much.  Pharmacy is [facility] on [address]

## 2024-07-11 NOTE — Telephone Encounter (Signed)
 Pt came in a stated that he is leaving the country for a year and needs his prescription filled out for that amount of time. Pt also states he may not need the medicine anymore but does not know if he should stop taking it or if he still needs it or not. Pt states he is leaving 08/01/24. Pt also states he ran out of medicine 1 day ago. Please advise. Pt best call back number is [phone].

## 2024-07-11 NOTE — Telephone Encounter (Signed)
 Patient has been notified with medical recommendations and verbalized understanding

## 2024-07-11 NOTE — Telephone Encounter (Signed)
 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  He is recommended to take it for a lifetime.  I have sent a refill worth a year to his pharmacy

## 2024-07-12 ENCOUNTER — Other Ambulatory Visit: Payer: Self-pay

## 2024-07-12 DIAGNOSIS — N401 Enlarged prostate with lower urinary tract symptoms: Secondary | ICD-10-CM

## 2024-07-12 DIAGNOSIS — N5201 Erectile dysfunction due to arterial insufficiency: Secondary | ICD-10-CM

## 2024-07-12 MED ORDER — SILDENAFIL CITRATE 100 MG PO TABS
50.0000 mg | ORAL_TABLET | Freq: Every day | ORAL | 3 refills | Status: AC | PRN
Start: 2024-07-12 — End: ?

## 2024-10-30 ENCOUNTER — Ambulatory Visit: Payer: 59 | Admitting: Oncology

## 2024-10-30 ENCOUNTER — Other Ambulatory Visit: Payer: 59

## 2024-11-08 ENCOUNTER — Other Ambulatory Visit: Payer: Self-pay

## 2024-11-08 DIAGNOSIS — D649 Anemia, unspecified: Secondary | ICD-10-CM

## 2024-11-11 ENCOUNTER — Inpatient Hospital Stay: Payer: 59 | Admitting: Oncology

## 2024-11-11 ENCOUNTER — Inpatient Hospital Stay: Payer: 59
